# Patient Record
Sex: Male | Born: 1945
Health system: Southern US, Community
[De-identification: ages and names within clinical notes are randomized; demographics above are authoritative.]

## PROBLEM LIST (undated history)

## (undated) DIAGNOSIS — R002 Palpitations: Secondary | ICD-10-CM

## (undated) DIAGNOSIS — I2510 Atherosclerotic heart disease of native coronary artery without angina pectoris: Secondary | ICD-10-CM

## (undated) DIAGNOSIS — E785 Hyperlipidemia, unspecified: Secondary | ICD-10-CM

## (undated) DIAGNOSIS — E119 Type 2 diabetes mellitus without complications: Secondary | ICD-10-CM

## (undated) DIAGNOSIS — K219 Gastro-esophageal reflux disease without esophagitis: Secondary | ICD-10-CM

## (undated) DIAGNOSIS — I214 Non-ST elevation (NSTEMI) myocardial infarction: Secondary | ICD-10-CM

## (undated) DIAGNOSIS — J189 Pneumonia, unspecified organism: Secondary | ICD-10-CM

## (undated) DIAGNOSIS — I10 Essential (primary) hypertension: Secondary | ICD-10-CM

## (undated) DIAGNOSIS — Z9989 Dependence on other enabling machines and devices: Secondary | ICD-10-CM

## (undated) DIAGNOSIS — G4733 Obstructive sleep apnea (adult) (pediatric): Secondary | ICD-10-CM

## (undated) HISTORY — DX: Palpitations: R00.2

## (undated) HISTORY — DX: Essential (primary) hypertension: I10

## (undated) HISTORY — PX: TONSILLECTOMY AND ADENOIDECTOMY: SUR1326

## (undated) HISTORY — PX: WISDOM TOOTH EXTRACTION: SHX21

## (undated) HISTORY — PX: APPENDECTOMY: SHX54

---

## 2001-03-26 HISTORY — PX: LIGAMENT REPAIR: SHX5444

## 2003-01-26 ENCOUNTER — Encounter (INDEPENDENT_AMBULATORY_CARE_PROVIDER_SITE_OTHER): Payer: Self-pay

## 2003-01-26 ENCOUNTER — Ambulatory Visit (HOSPITAL_COMMUNITY): Admission: RE | Admit: 2003-01-26 | Discharge: 2003-01-26 | Payer: Self-pay | Admitting: Gastroenterology

## 2004-09-06 ENCOUNTER — Ambulatory Visit (HOSPITAL_BASED_OUTPATIENT_CLINIC_OR_DEPARTMENT_OTHER): Admission: RE | Admit: 2004-09-06 | Discharge: 2004-09-06 | Payer: Self-pay | Admitting: Family Medicine

## 2004-09-10 ENCOUNTER — Ambulatory Visit: Payer: Self-pay | Admitting: Internal Medicine

## 2007-06-25 DIAGNOSIS — I214 Non-ST elevation (NSTEMI) myocardial infarction: Secondary | ICD-10-CM

## 2007-06-25 HISTORY — DX: Non-ST elevation (NSTEMI) myocardial infarction: I21.4

## 2007-07-02 ENCOUNTER — Inpatient Hospital Stay (HOSPITAL_COMMUNITY): Admission: EM | Admit: 2007-07-02 | Discharge: 2007-07-06 | Payer: Self-pay | Admitting: Emergency Medicine

## 2007-07-03 HISTORY — PX: CORONARY ANGIOPLASTY WITH STENT PLACEMENT: SHX49

## 2007-07-17 ENCOUNTER — Encounter (HOSPITAL_COMMUNITY): Admission: RE | Admit: 2007-07-17 | Discharge: 2007-10-15 | Payer: Self-pay | Admitting: Cardiovascular Disease

## 2007-10-06 HISTORY — PX: TRANSTHORACIC ECHOCARDIOGRAM: SHX275

## 2007-10-06 HISTORY — PX: CARDIOVASCULAR STRESS TEST: SHX262

## 2010-08-08 NOTE — Cardiovascular Report (Signed)
NAMEJAIMESON, GOPAL NO.:  [id number]   MEDICAL RECORD NO.:  [id number]          PATIENT TYPE:  INP   LOCATION:  2916                         FACILITY:  [facility]   PHYSICIAN:  Nanetta Batty, M.D.   DATE OF BIRTH:  Aug 20, 1945   DATE OF PROCEDURE:  DATE OF DISCHARGE:                            CARDIAC CATHETERIZATION   HISTORY OF PRESENT ILLNESS:  Mr. Justin Carr is a 65 year old married white  male, father of 1 living child with a history of tobacco abuse.  He was  admitted on July 02, 2007, with unstable angina.  He had nonspecific ST-  T wave changes and a mild increase in troponin.  He was pain-free on IV  heparin and nitro.  His troponins ended up increasing and his anterior T-  waves inverted, suggesting a proximal LAD lesion.  He presents now for  diagnostic coronary angiography to define his anatomy to rule out  ischemic etiology.   DESCRIPTION OF PROCEDURE:  The patient was brought to the second floor  of [facility] cardiac cath lab in the postabsorptive state.  He was  premedicated with p.o. Valium, IV fentanyl, and Versed.  His right groin  was prepped and shaved in the usual sterile fashion.  Xylocaine 1% was  used for local anesthesia.  A 6-French sheath was inserted into the  right femoral artery using standard Seldinger technique.  A 6-French  right and left Judkins diagnostic catheter as well as a 6-French pigtail  catheter were used for selective coronary angiography, left  ventriculography, subselective left internal mammary artery angiography,  and distal abdominal aortography.  Visipaque dye was used through the  entirety of the case.  Thoracic aorta, left ventricular, and pullback  heart pressures were recorded.   HEMODYNAMIC RESULTS:  1. Aortic systolic pressure 112 and diastolic pressure 59.  2. Left ventricular systolic pressure 114 and diastolic pressure 13.   SELECTIVE CORONARY ANGIOGRAPHY:  1. Left main normal.  2. LAD; the LAD had a 90%  ulcerated plaque in the proximal third      between the first and second diagonal branches.  3. Left circumflex; nondominant and free of significant disease.  4. Ramus intermedius branch; moderate in size and free of significant      disease.  5. Right coronary artery; dominant and free of significant disease.  6. Left internal mammary artery; this vessel is subselectively      visualized and is widely patent.  It is suitable for use during      coronary artery bypass grafting.  7. Distal abdominal aortography; distal abdominal aortogram was      performed using 20 mL of Visipaque dye at 20 mL per second.  There      was approximately 50% proximal right renal artery stenosis.  The      infrarenal abdominal aorta and iliac bifurcation were free of      significant atherosclerotic changes.   IMPRESSION:  Mr. Mannan has high-grade ulcerated proximal left anterior  descending artery disease with a non-ST-elevation myocardial infarction  and anterior T-wave inversion.  We will proceed  with percutaneous  coronary intervention and stenting using drug-eluting stent and  Angiomax.   The 6-French sheath in the right femoral artery was exchanged over the  wire for a 7-French sheath.  Using a 7-French Cordis JL 3.5 guide  catheter along with an Owen 4  190 Asahi soft wire and 2.5 x  predilatation was performed at nominal pressures.  The patient did  receive an Angiomax bolus with an ACT of 404.  He had already received  aspirin prior to coming to the lab.  He received 600 mg of p.o. Plavix  as well as 20 mg of Pepcid IV.  The wire easily crossed the lesion, and  predilatation was performed.  A Promus stent would not cross the lesion,  and after redilatation, a 2.75 x 20 Taxus Liberte stent was then  deployed across the first and second diagonal branches spanning the  lesion at 16 atmospheres (3 mm).  This was then postdilated with a 3.0 x  15 Quantum Maverick at 16 atmospheres (3.03 mm),  resulting in reduction  and 90% lesion to 0% residual with TIMI 3 flow and without dissection.  There was impingement on the ostium of the second diagonal branch.  The  Asahi wire was then pulled back within the stented segment, and the  ostium of the diagonal branch was wired.  This was then dilated with a  1.5 x 12 Voyager at 6 atmospheres, resulting in reduction of 90% ostial  D2 lesion to less than 50%.  This was a 1.5 x 1.75-mm millimeter vessel.  The patient tolerated procedure well.  There were no hemodynamic or  electrocardiographic sequelae.  The guidewire and catheter were removed.  The sheath was then secured in place.  The patient left the lab in  stable condition.  Sheaths will be removed in 2 hours.  The patient will  be treated with aspirin, Plavix, beta-blocker, statin, and ACE  inhibitor.  He will remain in the hospital for the next 24-48 hours  prior to discharge.  Cardiac risk factor modification will be stressed.      Nanetta Batty, M.D.  Electronically Signed     JB/MEDQ  D:  07/03/2007  T:  07/04/2007  Job:  [id number]   cc:   [facility]  [facility]  [place] [facility]

## 2010-08-08 NOTE — Discharge Summary (Signed)
Justin Carr, KIENER NO.:  [id number]   MEDICAL RECORD NO.:  [id number]          PATIENT TYPE:  INP   LOCATION:  3742                         FACILITY:  [facility]   PHYSICIAN:  Darcella Gasman. [place], N.P.  DATE OF BIRTH:  03-17-1946   DATE OF ADMISSION:  07/02/2007  DATE OF DISCHARGE:  07/06/2007                               DISCHARGE SUMMARY   DISCHARGE DIAGNOSES:  1. Non-ST elevation myocardial infarction.  2. Coronary artery disease undergoing percutaneous transluminal      coronary angioplasty and stent deployment with a TAXUS drug-eluting      stent into the mid left anterior descending by Dr. Nanetta Batty.      Normal ejection fraction 55%-60%.  3. Right renal artery stenosis of 60%.  4. Residual coronary disease, 30% left main disease.  5. Obstructive sleep apnea with CPAP.  6. Dyslipidemia.  7. Metabolic syndrome.  8. Tobacco abuse.   DISCHARGE CONDITION:  Improved.   PROCEDURES:  1. Combined left heart cath, July 03, 2007, by Dr. Nanetta Batty.  2. On July 03, 2007, percutaneous transluminal coronary angioplasty      and drug-eluting TAXUS stent to the mid left anterior descending by      Dr. Nanetta Batty.  3. Bradycardia with beta blocker, unable to discharge on beta blocker      secondary to heart rate.  4. Transient blurred vision post procedure with negative CT scan of      the head.   DISCHARGE MEDICATIONS:  1. Plavix 75 mg one daily, do not stop it, could cause a heart attack.  2. Aspirin 325 mg daily.  3. Altace 2.5 mg daily.  4. Lipitor 80 mg daily.  5. Zantac 150 mg twice a day.  6. Nitroglycerin sublingual under your tongue for chest pain one every      5 minutes while sitting and up to 3 tablets over 15 minutes, if      continued pain call 911.  7. Continue CPAP.  8. Follow up with Dr. Allyson Sabal at [facility] in 1-2      weeks, the office will call with date and time.   DISCHARGE INSTRUCTIONS:  1. No work until  after you see Dr. Allyson Sabal.  2. Increase activity slowly, no lifting for 1 week, no driving for 1      week.  3. Low-sodium, heart-healthy moderate carb diet.  Wash cath site with      soap and water.  Call us if any bleeding, swelling, or drainage.  4. Stop smoking.  5. Diet should be low salt, no concentrated sweets, cake, candy, no      white bread, white potatoes, and low-fat diet.   HISTORY OF PRESENT ILLNESS:  A 65 year old white male without previous  medical history presented to [facility] Emergency Room on July 02, 2007, with  complaints of left arm pain and chest pain.  His symptoms had started  the day prior to admission while at work, reports left arm pain with  associated substernal chest discomfort.  The discomfort lasted 5  minutes,  but he felt very poorly after the episode and that feeling  persisted all day, and on April 8, it continued.  He took his dog for a  walk on the evening of July 02, 2007, developed more chest discomfort  with heaviness up into his throat.  He took 2 aspirin and called a  friend to him bring to the emergency room.  At present, the patient was  pain free.  EMS actually brought him to the emergency room.   On admission, he was pain-free.  He had been under a lot of emotional  stress as his 44 year old son died last year at this time.   PAST MEDICAL HISTORY:  Negative.   FAMILY HISTORY:  Positive for coronary disease, father had bypass in his  106s.   SOCIAL HISTORY:  Married 2 sons, though 1 died of CO2 poisoning.   ALLERGIES:  PEANUTS.   OUTPATIENT MEDS:  None.   REVIEW OF SYSTEMS:  See H&P.   PHYSICAL EXAMINATION AT DISCHARGE:  VITAL SIGNS:  Blood pressure 117/63,  pulse 47, respiratory rate is 18, temp 97.5, and oxygen saturation 98%  with a CPAP.  HEART:  Regular rate and rhythm.  LUNGS:  Clear.  ABDOMEN:  Positive bowel sounds.  EXTREMITIES: No edema and the right groin cath site had been stable.   LABORATORY DATA:  Admission CBC,  hemoglobin 16.5, hematocrit 48.6, WBC  9, platelets 200,000, MCV 96.6, and neutrophils 59.  At discharge,  hemoglobin 14.5, hematocrit 41.8, WBC 8.8, and platelets 177,000.  On  chemistry, sodium 142, potassium 4.2, chloride 107, CO2 29, BUN 8,  creatinine 0.89, and glucose 132.  On discharge, essentially the same  but glucose was 98.  Coags on admission, pro-time 13.5, INR of 1, and  PTT 141 on heparin and it was therapeutic.  LFTs were all normal.  AST  24, ALT 28, and alkaline phos 66.  Cardiac enzymes, initially CK was 113  and MB was 6.7, the peak CK was 133 with an MB of 18.3 and prior to  discharge, CK was 42 and MB 1.3.  Troponin I peak was 1.52.   LDL was 139, cholesterol 199, HDL 30, and triglycerides 841.   Magnesium was 2.3.  Calcium 8.7.  BNP was 85.  TSH 2.246.   Glycohemoglobin was 6.2.   CHEST X-RAY:  No acute cardiopulmonary abnormality.  He also received a  CT of his head on July 04, 2007.  He had blurred vision lasting 15  minutes.  Subcentimeter hypodensity at the left pontomedullary junction  could be a dilated perivascular space, chronic lacunar infarct or  artifact, otherwise normal noncontrast appearance of the brain, no acute  findings.   EKG:  On admission, sinus rhythm rate at 63, nonspecific T-wave  abnormalities.  On followup, July 03, 2007, at 7:00 a.m., he had T-wave  inversions in V3 through V6 and in aVL.   Continued deep T-wave inversions have been maintained since that time,  but the patient has been asymptomatic.   HOSPITAL COURSE:  Mr. Justin Carr was admitted on July 02, 2007, with unstable  anginas.  The enzymes came back positive for non-ST elevation MI.  Initial EKG was without acute changes, just nonspecific ST changes.  Later in the morning, he had deep T-wave inversions in his anterolateral  leads, and he had positive cardiac enzymes.  He underwent cardiac  catheterization and stent deployment was done as previously stated.   The patient did  well postprocedure.  Right  groin was stable.  Cardiac  rehab was started.  Medications were adjusted.  He was ambulating  without problems.  By July 06, 2007, he was stable and ready for  discharge home.  We had attempted Lopressor 12.5 mg twice a day, but he  did not tolerate it due to significant bradycardia.  Therefore, he is  not going home on a beta blocker at this time.  He does have obstructive  sleep apnea and is on CPAP at home, and we have recommended him for  phase II cardiac rehab.  Please note, glycohemoglobin was very slightly  elevated.  The patient has metabolic syndrome and was instructed on diet  changes and would need to follow up with primary care for further  management of his metabolic syndrome.  He will follow up with Dr. Allyson Sabal  for instructions on the return to work.  He was seen and discharged by  Dr. Domingo Sep on July 06, 2007.      Darcella Gasman. Annie Paras, N.P.     LRI/MEDQ  D:  07/06/2007  T:  07/07/2007  Job:  [id number]   cc:   Nanetta Batty, M.D.  [facility]

## 2010-08-11 NOTE — Procedures (Signed)
NAMESTEN, Justin Carr NO.:  [id number]   MEDICAL RECORD NO.:  [id number]          PATIENT TYPE:  OUT   LOCATION:  SLEEP CENTER                 FACILITY:  [facility]   PHYSICIAN:  Clinton D. Maple Hudson, M.D. DATE OF BIRTH:  [birth year]   DATE OF STUDY:  09/06/2004                              NOCTURNAL POLYSOMNOGRAM   REFERRING PHYSICIAN:  Deatra James, MD   INDICATION FOR STUDY:  Hypersomnia with sleep apnea.  Epworth sleepiness  score 11/24, BMI 27, weight 195 pounds.   SLEEP ARCHITECTURE:  Total sleep time 337 minutes with sleep efficiency 75%.  Stage I 11%, stage II 74%, stages III and IV absent, REM 15% of total sleep  time.  Sleep latency 62 minutes, REM latency 237 minutes, awake after sleep  onset 50 minutes, arousal index 26.  No bedtime medications taken.   RESPIRATORY DATA:  Split study protocol.  Respiratory disturbance index  (RDI, AHI) 41.1 obstructive events per hour, indicating moderately severe  obstructive sleep apnea/hypopnea syndrome before CPAP.  There were 43  obstructive apneas, 13 central apneas and 25 fixed apneas with 10 hypopneas  before CPAP control.  Most events and most sleep were while supine.  REM RDI  8.6.  CPAP was titrated to 10 cwp, RDI 4.6 per hour, using a Respironics  Comfort Light tube with small nasal pillows and heated humidifier.   OXYGEN DATA:  Moderate snoring with oxygen desaturation to a nadir of 88%  before CPAP.  After CPAP control, saturation held 96-98% on room air.   CARDIAC DATA:  Sinus rhythm with occasional PAC and PVC.   MOVEMENT PARASOMNIA:  Occasional leg jerks with little effect on sleep.   IMPRESSION RECOMMENDATION:  1.  Moderately severe obstructive sleep apnea/hypopnea syndrome, RDI 41.1      per hour with moderate snoring and oxygen desaturation to 88%.  2.  Successful CPAP titration to 10 cwp, RDI 4.6 per hour using a      Respironics Comfort Lite 2 with small nasal pillows and heated       humidifier.      Clinton D. Maple Hudson, M.D.  Diplomat   CDY/MEDQ  D:  09/10/2004 11:23:44  T:  09/11/2004 16:08:07  Job:  [id number]

## 2010-08-11 NOTE — Op Note (Signed)
   [name], Justin Carr                              ACCOUNT NO.:  [id number]   MEDICAL RECORD NO.:  [id number]                   PATIENT TYPE:  AMB   LOCATION:  ENDO                                 [facility]ACILITY[facility]   PHYSICIAN:  Bernette Redbird, M.D.                DATE OF BIRTH:  [birth year]   DATE OF PROCEDURE:  DATE OF DISCHARGE:                                 OPERATIVE REPORT   No dictation for this job.                                               Bernette Redbird, M.D.    RB/MEDQ  D:  01/26/2003  T:  01/26/2003  Job:  [id number]

## 2010-12-19 LAB — BASIC METABOLIC PANEL
BUN: 10
CO2: 28
Chloride: 104
Chloride: 105
Chloride: 106
Creatinine, Ser: 0.93
Creatinine, Ser: 1.1
GFR calc Af Amer: >60
GFR calc Af Amer: >60
GFR calc non Af Amer: >60
Potassium: 3.9
Potassium: 4
Sodium: 139

## 2010-12-19 LAB — POCT I-STAT, CHEM 8
Calcium, Ion: 1.18
Chloride: 106
Creatinine, Ser: 1.3
Glucose, Bld: 102 — ABNORMAL HIGH
HCT: 49

## 2010-12-19 LAB — CBC
HCT: 41.9
HCT: 42.3
HCT: 48.6
Hemoglobin: 16.5
MCHC: 34
MCV: 95.8
MCV: 96.6
MCV: 97
Platelets: 179
RBC: 4.31
RBC: 4.37
RBC: 4.38
RDW: 13.5
WBC: 8.7
WBC: 8.8
WBC: 9.9

## 2010-12-19 LAB — COMPREHENSIVE METABOLIC PANEL
ALT: 28
Alkaline Phosphatase: 66
BUN: 8
CO2: 29
Chloride: 107
GFR calc non Af Amer: >60
Glucose, Bld: 132 — ABNORMAL HIGH
Potassium: 4.2
Sodium: 142
Total Bilirubin: 0.9

## 2010-12-19 LAB — HEMOGLOBIN A1C
Hgb A1c MFr Bld: 6.2 — ABNORMAL HIGH
Mean Plasma Glucose: 143

## 2010-12-19 LAB — CARDIAC PANEL(CRET KIN+CKTOT+MB+TROPI)
CK, MB: 1.3
CK, MB: 14.3 — ABNORMAL HIGH
Relative Index: 12.5 — ABNORMAL HIGH
Relative Index: 13.8 — ABNORMAL HIGH
Relative Index: INVALID
Total CK: 42
Total CK: 97
Troponin I: 1.46
Troponin I: 1.52

## 2010-12-19 LAB — DIFFERENTIAL
Basophils Absolute: 0
Eosinophils Relative: 2
Lymphocytes Relative: 30
Monocytes Absolute: 0.8
Monocytes Relative: 9

## 2010-12-19 LAB — LIPID PANEL
Cholesterol: 199
HDL: 30 — ABNORMAL LOW
LDL Cholesterol: 139 — ABNORMAL HIGH
Total CHOL/HDL Ratio: 6.6

## 2010-12-19 LAB — POCT CARDIAC MARKERS: Troponin i, poc: 0.19 — ABNORMAL HIGH

## 2010-12-19 LAB — TROPONIN I
Troponin I: 0.51
Troponin I: 1.03

## 2010-12-19 LAB — B-NATRIURETIC PEPTIDE (CONVERTED LAB): Pro B Natriuretic peptide (BNP): 85

## 2010-12-19 LAB — CK TOTAL AND CKMB (NOT AT ARMC)
CK, MB: 6.7 — ABNORMAL HIGH
Relative Index: 5.9 — ABNORMAL HIGH
Relative Index: INVALID
Total CK: 113

## 2010-12-19 LAB — HEPARIN LEVEL (UNFRACTIONATED): Heparin Unfractionated: <0.1 — ABNORMAL LOW

## 2011-04-09 DIAGNOSIS — Z79899 Other long term (current) drug therapy: Secondary | ICD-10-CM | POA: Diagnosis not present

## 2011-04-09 DIAGNOSIS — E782 Mixed hyperlipidemia: Secondary | ICD-10-CM | POA: Diagnosis not present

## 2011-04-09 DIAGNOSIS — I701 Atherosclerosis of renal artery: Secondary | ICD-10-CM | POA: Diagnosis not present

## 2011-04-09 DIAGNOSIS — I10 Essential (primary) hypertension: Secondary | ICD-10-CM | POA: Diagnosis not present

## 2011-04-20 DIAGNOSIS — E119 Type 2 diabetes mellitus without complications: Secondary | ICD-10-CM | POA: Diagnosis not present

## 2011-04-20 DIAGNOSIS — I2510 Atherosclerotic heart disease of native coronary artery without angina pectoris: Secondary | ICD-10-CM | POA: Diagnosis not present

## 2011-04-20 DIAGNOSIS — I701 Atherosclerosis of renal artery: Secondary | ICD-10-CM | POA: Diagnosis not present

## 2011-05-04 DIAGNOSIS — K5289 Other specified noninfective gastroenteritis and colitis: Secondary | ICD-10-CM | POA: Diagnosis not present

## 2011-05-21 DIAGNOSIS — J40 Bronchitis, not specified as acute or chronic: Secondary | ICD-10-CM | POA: Diagnosis not present

## 2011-05-22 DIAGNOSIS — G4733 Obstructive sleep apnea (adult) (pediatric): Secondary | ICD-10-CM | POA: Diagnosis not present

## 2011-05-22 DIAGNOSIS — E782 Mixed hyperlipidemia: Secondary | ICD-10-CM | POA: Diagnosis not present

## 2011-05-22 DIAGNOSIS — I10 Essential (primary) hypertension: Secondary | ICD-10-CM | POA: Diagnosis not present

## 2011-07-11 DIAGNOSIS — G4733 Obstructive sleep apnea (adult) (pediatric): Secondary | ICD-10-CM | POA: Diagnosis not present

## 2011-07-11 DIAGNOSIS — G4761 Periodic limb movement disorder: Secondary | ICD-10-CM | POA: Diagnosis not present

## 2011-07-24 ENCOUNTER — Other Ambulatory Visit: Payer: Self-pay | Admitting: Dermatology

## 2011-07-24 DIAGNOSIS — D239 Other benign neoplasm of skin, unspecified: Secondary | ICD-10-CM | POA: Diagnosis not present

## 2011-07-24 DIAGNOSIS — L821 Other seborrheic keratosis: Secondary | ICD-10-CM | POA: Diagnosis not present

## 2011-07-24 DIAGNOSIS — L820 Inflamed seborrheic keratosis: Secondary | ICD-10-CM | POA: Diagnosis not present

## 2011-07-24 DIAGNOSIS — L578 Other skin changes due to chronic exposure to nonionizing radiation: Secondary | ICD-10-CM | POA: Diagnosis not present

## 2011-08-14 ENCOUNTER — Ambulatory Visit (HOSPITAL_BASED_OUTPATIENT_CLINIC_OR_DEPARTMENT_OTHER): Payer: Medicare Other | Attending: Cardiovascular Disease | Admitting: General Practice

## 2011-08-14 VITALS — Ht 71.0 in | Wt 205.0 lb

## 2011-08-14 DIAGNOSIS — G4710 Hypersomnia, unspecified: Secondary | ICD-10-CM | POA: Insufficient documentation

## 2011-08-14 DIAGNOSIS — G4733 Obstructive sleep apnea (adult) (pediatric): Secondary | ICD-10-CM

## 2011-08-14 DIAGNOSIS — R259 Unspecified abnormal involuntary movements: Secondary | ICD-10-CM | POA: Diagnosis not present

## 2011-08-25 DIAGNOSIS — G4730 Sleep apnea, unspecified: Secondary | ICD-10-CM

## 2011-08-25 DIAGNOSIS — G4710 Hypersomnia, unspecified: Secondary | ICD-10-CM

## 2011-08-25 DIAGNOSIS — R259 Unspecified abnormal involuntary movements: Secondary | ICD-10-CM | POA: Diagnosis not present

## 2011-08-25 NOTE — Procedures (Signed)
Justin Carr, Justin Carr NO.:  [id number]  MEDICAL RECORD NO.:  [id number]          PATIENT TYPE:  OUT  LOCATION:  SLEEP CENTER                 [facility]ACILITY:  [facility]  PHYSICIAN:  Hermann Dottavio D. Maple Hudson, MD, FCCP, FACPDATE OF BIRTH:  [birth year]1945  DATE OF STUDY:  08/14/2011                           NOCTURNAL POLYSOMNOGRAM  REFERRING PHYSICIAN:  Nicki Guadalajara, M.D.  INDICATION FOR STUDY:  Hypersomnia with sleep apnea.  EPWORTH SLEEPINESS SCORE:  7/24.  BMI 29, weight 205 pounds.  Height 71 inches.  Neck 16 inches.  MEDICATIONS:  Home medications are charted and reviewed.  The baseline diagnostic NPSG report is available from [facility], dated July 11, 2011 which recorded an AHI of 10.7 per hour.  CPAP titration is requested.  SLEEP ARCHITECTURE:  Total sleep time 280.5 minutes with sleep efficiency 75.5%.  Stage I was 7.5%.  Stage II 72%.  Stage III absent. REM 20.5% of total sleep time.  Sleep latency 36 minutes.  REM latency 209.5 minutes.  Awake after sleep onset 56.5 minutes.  Arousal index 6.8.  Bedtime medication:  Aspirin.  RESPIRATORY DATA:  CPAP titration protocol.  CPAP was titrated to 8 CWP, AHI 2.5 per hour.  He wore a standard Fisher and Paykel Pilariro nasal pillow mask with heated humidifier and C flex setting of 3.  OXYGEN DATA:  Snoring was prevented at final CPAP and mean oxygen saturation held 95.9% on room air.  CARDIAC DATA:  Normal sinus rhythm.  MOVEMENT-PARASOMNIA:  A total of 81 limb jerks were counted, of which 2 were associated with arousals or awakenings for periodic limb movement with arousal index of 0.4 per hour.  No bathroom trips periods.  IMPRESSION-RECOMMENDATION: 1. Successful CPAP titration to 8 CWP, apnea-hypopnea index 2.5 per     hour.  He wore a standard Fisher and Paykel Pilariro nasal pillow     mask with heated humidifier and C flex setting of 3.  CPAP was well     tolerated.  Snoring was prevented and  mean oxygen saturation held     95.9% on room air. 2. Baseline diagnostic NPSG on July 11, 2011, at [facility] recorded an apnea-hypopnea index of 10.7 per hour. 3. Limb jerks were noted during titration.  A total of 81 limb jerks     were counted, of which 2 were associated with arousals or     awakenings for a periodic limb movement arousal index of 0.4 per     hour.  This is of doubtful clinical     significance.  Limb jerks are common during CPAP titration and     usually less significant after adjustment to CPAP.     Jordynne Mccown D. Maple Hudson, MD, [facility], FACP Diplomate, American Board of Sleep Medicine    CDY/MEDQ  D:  08/25/2011 08:31:59  T:  08/25/2011 09:05:26  Job:  [id number]

## 2011-10-25 DIAGNOSIS — G4733 Obstructive sleep apnea (adult) (pediatric): Secondary | ICD-10-CM | POA: Diagnosis not present

## 2011-10-25 DIAGNOSIS — G4737 Central sleep apnea in conditions classified elsewhere: Secondary | ICD-10-CM | POA: Diagnosis not present

## 2011-10-25 DIAGNOSIS — H3530 Unspecified macular degeneration: Secondary | ICD-10-CM | POA: Diagnosis not present

## 2011-10-25 DIAGNOSIS — H524 Presbyopia: Secondary | ICD-10-CM | POA: Diagnosis not present

## 2011-10-25 DIAGNOSIS — E119 Type 2 diabetes mellitus without complications: Secondary | ICD-10-CM | POA: Diagnosis not present

## 2011-10-25 DIAGNOSIS — H5200 Hypermetropia, unspecified eye: Secondary | ICD-10-CM | POA: Diagnosis not present

## 2011-12-19 DIAGNOSIS — Z1211 Encounter for screening for malignant neoplasm of colon: Secondary | ICD-10-CM | POA: Diagnosis not present

## 2011-12-19 DIAGNOSIS — E785 Hyperlipidemia, unspecified: Secondary | ICD-10-CM | POA: Diagnosis not present

## 2011-12-19 DIAGNOSIS — Z125 Encounter for screening for malignant neoplasm of prostate: Secondary | ICD-10-CM | POA: Diagnosis not present

## 2011-12-19 DIAGNOSIS — N529 Male erectile dysfunction, unspecified: Secondary | ICD-10-CM | POA: Diagnosis not present

## 2011-12-19 DIAGNOSIS — Z0000 Encounter for general adult medical examination without abnormal findings: Secondary | ICD-10-CM | POA: Diagnosis not present

## 2011-12-19 DIAGNOSIS — Z23 Encounter for immunization: Secondary | ICD-10-CM | POA: Diagnosis not present

## 2011-12-19 DIAGNOSIS — I2510 Atherosclerotic heart disease of native coronary artery without angina pectoris: Secondary | ICD-10-CM | POA: Diagnosis not present

## 2011-12-19 DIAGNOSIS — E119 Type 2 diabetes mellitus without complications: Secondary | ICD-10-CM | POA: Diagnosis not present

## 2012-01-04 DIAGNOSIS — Z87891 Personal history of nicotine dependence: Secondary | ICD-10-CM | POA: Diagnosis not present

## 2012-01-11 DIAGNOSIS — G4737 Central sleep apnea in conditions classified elsewhere: Secondary | ICD-10-CM | POA: Diagnosis not present

## 2012-01-11 DIAGNOSIS — G4733 Obstructive sleep apnea (adult) (pediatric): Secondary | ICD-10-CM | POA: Diagnosis not present

## 2012-01-16 DIAGNOSIS — Z23 Encounter for immunization: Secondary | ICD-10-CM | POA: Diagnosis not present

## 2012-04-11 DIAGNOSIS — Z79899 Other long term (current) drug therapy: Secondary | ICD-10-CM | POA: Diagnosis not present

## 2012-04-11 DIAGNOSIS — E782 Mixed hyperlipidemia: Secondary | ICD-10-CM | POA: Diagnosis not present

## 2012-04-17 DIAGNOSIS — I2510 Atherosclerotic heart disease of native coronary artery without angina pectoris: Secondary | ICD-10-CM | POA: Diagnosis not present

## 2012-04-17 DIAGNOSIS — I10 Essential (primary) hypertension: Secondary | ICD-10-CM | POA: Diagnosis not present

## 2012-04-17 DIAGNOSIS — E782 Mixed hyperlipidemia: Secondary | ICD-10-CM | POA: Diagnosis not present

## 2012-04-21 ENCOUNTER — Other Ambulatory Visit (HOSPITAL_COMMUNITY): Payer: Self-pay | Admitting: Cardiovascular Disease

## 2012-04-21 DIAGNOSIS — I701 Atherosclerosis of renal artery: Secondary | ICD-10-CM

## 2012-05-20 ENCOUNTER — Ambulatory Visit (HOSPITAL_COMMUNITY)
Admission: RE | Admit: 2012-05-20 | Discharge: 2012-05-20 | Disposition: A | Discharge status: Home / Self Care | Payer: Medicare Other | Source: Ambulatory Visit | Attending: Cardiovascular Disease | Admitting: Cardiovascular Disease

## 2012-05-20 DIAGNOSIS — I739 Peripheral vascular disease, unspecified: Secondary | ICD-10-CM | POA: Diagnosis not present

## 2012-05-20 DIAGNOSIS — I10 Essential (primary) hypertension: Secondary | ICD-10-CM | POA: Diagnosis not present

## 2012-05-20 DIAGNOSIS — I701 Atherosclerosis of renal artery: Secondary | ICD-10-CM | POA: Diagnosis not present

## 2012-05-20 HISTORY — PX: OTHER SURGICAL HISTORY: SHX169

## 2012-05-20 NOTE — Progress Notes (Signed)
Renal Duplex Completed. Hung Rhinesmith D  

## 2012-06-05 DIAGNOSIS — J069 Acute upper respiratory infection, unspecified: Secondary | ICD-10-CM | POA: Diagnosis not present

## 2012-06-27 DIAGNOSIS — M25519 Pain in unspecified shoulder: Secondary | ICD-10-CM | POA: Diagnosis not present

## 2012-07-07 DIAGNOSIS — M5412 Radiculopathy, cervical region: Secondary | ICD-10-CM | POA: Diagnosis not present

## 2012-07-10 DIAGNOSIS — G4733 Obstructive sleep apnea (adult) (pediatric): Secondary | ICD-10-CM | POA: Diagnosis not present

## 2012-07-10 DIAGNOSIS — G4737 Central sleep apnea in conditions classified elsewhere: Secondary | ICD-10-CM | POA: Diagnosis not present

## 2012-07-15 ENCOUNTER — Other Ambulatory Visit: Payer: Self-pay | Admitting: Family Medicine

## 2012-07-15 DIAGNOSIS — M25512 Pain in left shoulder: Secondary | ICD-10-CM

## 2012-07-15 DIAGNOSIS — M542 Cervicalgia: Secondary | ICD-10-CM

## 2012-07-19 ENCOUNTER — Ambulatory Visit
Admission: RE | Admit: 2012-07-19 | Discharge: 2012-07-19 | Disposition: A | Discharge status: Home / Self Care | Payer: Medicare Other | Source: Ambulatory Visit | Attending: Family Medicine | Admitting: Family Medicine

## 2012-07-19 DIAGNOSIS — M25512 Pain in left shoulder: Secondary | ICD-10-CM

## 2012-07-19 DIAGNOSIS — M542 Cervicalgia: Secondary | ICD-10-CM

## 2012-07-19 DIAGNOSIS — M5030 Other cervical disc degeneration, unspecified cervical region: Secondary | ICD-10-CM | POA: Diagnosis not present

## 2012-07-19 DIAGNOSIS — M47812 Spondylosis without myelopathy or radiculopathy, cervical region: Secondary | ICD-10-CM | POA: Diagnosis not present

## 2012-07-22 ENCOUNTER — Other Ambulatory Visit: Payer: Self-pay | Admitting: Dermatology

## 2012-07-22 DIAGNOSIS — D239 Other benign neoplasm of skin, unspecified: Secondary | ICD-10-CM | POA: Diagnosis not present

## 2012-07-22 DIAGNOSIS — L819 Disorder of pigmentation, unspecified: Secondary | ICD-10-CM | POA: Diagnosis not present

## 2012-07-22 DIAGNOSIS — L821 Other seborrheic keratosis: Secondary | ICD-10-CM | POA: Diagnosis not present

## 2012-07-22 DIAGNOSIS — D1801 Hemangioma of skin and subcutaneous tissue: Secondary | ICD-10-CM | POA: Diagnosis not present

## 2012-07-22 DIAGNOSIS — L578 Other skin changes due to chronic exposure to nonionizing radiation: Secondary | ICD-10-CM | POA: Diagnosis not present

## 2012-07-22 DIAGNOSIS — D485 Neoplasm of uncertain behavior of skin: Secondary | ICD-10-CM | POA: Diagnosis not present

## 2012-09-18 DIAGNOSIS — M5000 Cervical disc disorder with myelopathy, unspecified cervical region: Secondary | ICD-10-CM | POA: Diagnosis not present

## 2012-10-31 DIAGNOSIS — H3530 Unspecified macular degeneration: Secondary | ICD-10-CM | POA: Diagnosis not present

## 2012-10-31 DIAGNOSIS — H2510 Age-related nuclear cataract, unspecified eye: Secondary | ICD-10-CM | POA: Diagnosis not present

## 2012-12-25 DIAGNOSIS — E785 Hyperlipidemia, unspecified: Secondary | ICD-10-CM | POA: Diagnosis not present

## 2012-12-25 DIAGNOSIS — N529 Male erectile dysfunction, unspecified: Secondary | ICD-10-CM | POA: Diagnosis not present

## 2012-12-25 DIAGNOSIS — I10 Essential (primary) hypertension: Secondary | ICD-10-CM | POA: Diagnosis not present

## 2012-12-25 DIAGNOSIS — Z1159 Encounter for screening for other viral diseases: Secondary | ICD-10-CM | POA: Diagnosis not present

## 2012-12-25 DIAGNOSIS — Z0000 Encounter for general adult medical examination without abnormal findings: Secondary | ICD-10-CM | POA: Diagnosis not present

## 2012-12-25 DIAGNOSIS — E119 Type 2 diabetes mellitus without complications: Secondary | ICD-10-CM | POA: Diagnosis not present

## 2013-01-05 DIAGNOSIS — M25519 Pain in unspecified shoulder: Secondary | ICD-10-CM | POA: Diagnosis not present

## 2013-01-19 DIAGNOSIS — Z23 Encounter for immunization: Secondary | ICD-10-CM | POA: Diagnosis not present

## 2013-03-17 ENCOUNTER — Other Ambulatory Visit: Payer: Self-pay | Admitting: Gastroenterology

## 2013-03-17 DIAGNOSIS — Z1211 Encounter for screening for malignant neoplasm of colon: Secondary | ICD-10-CM | POA: Diagnosis not present

## 2013-03-17 DIAGNOSIS — D126 Benign neoplasm of colon, unspecified: Secondary | ICD-10-CM | POA: Diagnosis not present

## 2013-03-24 ENCOUNTER — Encounter (HOSPITAL_COMMUNITY): Payer: Self-pay | Admitting: Pharmacy Technician

## 2013-03-24 NOTE — Pre-Procedure Instructions (Signed)
Wiliam Cauthorn  03/24/2013   Your procedure is scheduled on: Friday, January 9th.  Report to [facility], Main Entrance / Entrance "A" at 10:50 AM.  Call this number if you have problems the morning of surgery: [phone]   Remember:   Do not eat food or drink liquids after midnight, Thursday, January 8th.   Take these medicines the morning of surgery with A SIP OF WATER: ranitidine (ZANTAC)    Do not wear jewelry.  Do not wear lotions, powders, or colgnes. You may wear deodorant.  Men may shave face and neck only.  Do not bring valuables to the hospital.  [facility] is not responsible for any belongings or valuables.               Contacts, dentures or bridgework may not be worn into surgery.  Leave suitcase in the car. After surgery it may be brought to your room.  For patients admitted to the hospital, discharge time is determined by your                treatment team.               Patients discharged the day of surgery will not be allowed to drive home.  Name and phone number of your driver: -   Special Instructions: Shower using CHG 2 nights before surgery and the night before surgery.  If you shower the day of surgery use CHG.  Use special wash - you have one bottle of CHG for all showers.  You should use approximately 1/3 of the bottle for each shower.   Please read over the following fact sheets that you were given: Pain Booklet, Coughing and Deep Breathing and Surgical Site Infection Prevention

## 2013-03-25 ENCOUNTER — Encounter (HOSPITAL_COMMUNITY): Payer: Self-pay

## 2013-03-25 ENCOUNTER — Encounter (HOSPITAL_COMMUNITY)
Admission: RE | Admit: 2013-03-25 | Discharge: 2013-03-25 | Disposition: A | Discharge status: Home / Self Care | Payer: Medicare Other | Source: Ambulatory Visit | Attending: Anesthesiology | Admitting: Anesthesiology

## 2013-03-25 ENCOUNTER — Telehealth: Payer: Self-pay | Admitting: Cardiovascular Disease

## 2013-03-25 ENCOUNTER — Encounter (HOSPITAL_COMMUNITY)
Admission: RE | Admit: 2013-03-25 | Discharge: 2013-03-25 | Disposition: A | Discharge status: Home / Self Care | Payer: Medicare Other | Source: Ambulatory Visit | Attending: Orthopedic Surgery | Admitting: Orthopedic Surgery

## 2013-03-25 DIAGNOSIS — Z01818 Encounter for other preprocedural examination: Secondary | ICD-10-CM | POA: Diagnosis not present

## 2013-03-25 DIAGNOSIS — Z01810 Encounter for preprocedural cardiovascular examination: Secondary | ICD-10-CM | POA: Insufficient documentation

## 2013-03-25 DIAGNOSIS — Z01812 Encounter for preprocedural laboratory examination: Secondary | ICD-10-CM | POA: Insufficient documentation

## 2013-03-25 DIAGNOSIS — J40 Bronchitis, not specified as acute or chronic: Secondary | ICD-10-CM | POA: Diagnosis not present

## 2013-03-25 HISTORY — DX: Gastro-esophageal reflux disease without esophagitis: K21.9

## 2013-03-25 HISTORY — DX: Hyperlipidemia, unspecified: E78.5

## 2013-03-25 HISTORY — DX: Atherosclerotic heart disease of native coronary artery without angina pectoris: I25.10

## 2013-03-25 LAB — BASIC METABOLIC PANEL
BUN: 17 mg/dL (ref 6–23)
Calcium: 9.3 mg/dL (ref 8.4–10.5)
Chloride: 102 mEq/L (ref 96–112)
GFR calc Af Amer: >90 mL/min (ref >90)
Potassium: 4.5 mEq/L (ref 3.7–5.3)
Sodium: 142 mEq/L (ref 137–147)

## 2013-03-25 LAB — CBC
HCT: 44.9 % (ref 39.0–52.0)
Hemoglobin: 16.2 g/dL (ref 13.0–17.0)
MCHC: 36.1 g/dL — ABNORMAL HIGH (ref 30.0–36.0)
RBC: 4.81 MIL/uL (ref 4.22–5.81)
RDW: 12.9 % (ref 11.5–15.5)
WBC: 7.6 10*3/uL (ref 4.0–10.5)

## 2013-03-25 MED ORDER — CHLORHEXIDINE GLUCONATE 4 % EX LIQD: 60.0000 mL | Freq: Once | CUTANEOUS | Status: DC

## 2013-03-25 NOTE — Progress Notes (Signed)
Per Toniann Fail in office pt to stop aspirin 5 days prior to surgery, waiting for Dr Allyson Sabal to give instructions on when to stop plavix.  Chart to [facility] for review

## 2013-03-25 NOTE — Telephone Encounter (Signed)
Spoke to [place]- with Dr Rennis Chris.  Toniann Fail wanted to know if patient had cardiac clearance from Dr Allyson Sabal.The patient is at anesth.doing pre-op for his surgery 04/03/13.   Spoke to [employer] -She had signed form awaiting to fax-- Notified Wendy Cardiac Clearance was faxed to [phone]

## 2013-03-27 ENCOUNTER — Encounter (HOSPITAL_COMMUNITY): Payer: Self-pay

## 2013-03-27 NOTE — Progress Notes (Signed)
Anesthesia Chart Review:  Patient is a 68 year old male scheduled for right shoulder arthroscopy with subacromial decompression and distal clavicle resection on 04/03/13 by Dr. Onnie Graham.  History includes former smoker, CAD/NSTEMI '09 s/p LAD DES, DM2, HLD, GERD, OSA, right wrist surgery.  CABG is checked on his history, but there is only mention of an LAD stent according to Dr. Kennon Holter notes. PCP is Dr. Ernestine Conrad.  Endocrinologist is Dr. Chalmers Cater.  Cardiologist is Dr. [name].  He cleared patient for this procedure with permission to hold ASA and Plavix 5-7 days preoperatively. Abigail Butts at Dr. Susie Cassette office states patient was told to hold both five days preoperatively.)  EKG on 03/25/13 showed NSR.  Nuclear stress test on 10/06/07 showed normal myocardial perfusion demonstrating attenuation artifact in the inferior region of the myocardium.  No ischemia or infarct/scar seen in the remaining myocardium.  No significant ischemia.  Post stress EF 68%.  No significant wall motion abnormalities.  Low risk scan.  Echo on 10/06/07 showed normal LV size, EF, and wall motion.  Cardiac cath on 07/03/07 showed: 1. Left main normal.  2. LAD; the LAD had a 90% ulcerated plaque in the proximal third between the first and second diagonal branches.  3. Left circumflex; nondominant and free of significant disease.  4. Ramus intermedius branch; moderate in size and free of significant disease.  5. Right coronary artery; dominant and free of significant disease.  6. Left internal mammary artery; this vessel is subselectively visualized and is widely patent. It is suitable for use during coronary artery bypass grafting.  7. Distal abdominal aortography; distal abdominal aortogram was performed using 20 mL of Visipaque dye at 20 mL per second. There was approximately 50% proximal right renal artery stenosis. The infrarenal abdominal aorta and iliac bifurcation were free of significant atherosclerotic changes.   CXR  on 03/25/13 showed: Mild lung hyperexpansion and bronchitic change without acute cardiopulmonary disease.   Preoperative labs noted.  He will get a fasting glucose on arrival.  He has been cleared by cardiology.  If glucose is reasonable and otherwise no acute changes then I would anticipate that he could proceed as planned.  George Hugh [facility] Short Stay Center/Anesthesiology Phone [phone] 03/27/2013 4:43 PM

## 2013-04-02 MED ORDER — CEFAZOLIN SODIUM-DEXTROSE 2-3 GM-% IV SOLR
2.0000 g | INTRAVENOUS | Status: AC
  Administered 2013-04-03: 2 g via INTRAVENOUS
  Filled 2013-04-02: qty 50

## 2013-04-02 NOTE — Progress Notes (Signed)
Pt notified of new arrival time of 10:15-verbalized understanding

## 2013-04-03 ENCOUNTER — Ambulatory Visit (HOSPITAL_COMMUNITY)
Admission: RE | Admit: 2013-04-03 | Discharge: 2013-04-03 | Disposition: A | Discharge status: Home / Self Care | Payer: Medicare Other | Source: Ambulatory Visit | Attending: Orthopedic Surgery | Admitting: Orthopedic Surgery

## 2013-04-03 ENCOUNTER — Encounter (HOSPITAL_COMMUNITY): Payer: Self-pay | Admitting: *Deleted

## 2013-04-03 ENCOUNTER — Ambulatory Visit (HOSPITAL_COMMUNITY): Payer: Medicare Other | Admitting: Anesthesiology

## 2013-04-03 ENCOUNTER — Encounter (HOSPITAL_COMMUNITY): Payer: Medicare Other | Admitting: Vascular Surgery

## 2013-04-03 ENCOUNTER — Encounter (HOSPITAL_COMMUNITY)
Admission: RE | Disposition: A | Discharge status: Home / Self Care | Payer: Self-pay | Source: Ambulatory Visit | Attending: Orthopedic Surgery

## 2013-04-03 DIAGNOSIS — X58XXXA Exposure to other specified factors, initial encounter: Secondary | ICD-10-CM | POA: Insufficient documentation

## 2013-04-03 DIAGNOSIS — M7580 Other shoulder lesions, unspecified shoulder: Principal | ICD-10-CM

## 2013-04-03 DIAGNOSIS — Z87891 Personal history of nicotine dependence: Secondary | ICD-10-CM | POA: Insufficient documentation

## 2013-04-03 DIAGNOSIS — Z91010 Allergy to peanuts: Secondary | ICD-10-CM | POA: Insufficient documentation

## 2013-04-03 DIAGNOSIS — S43439A Superior glenoid labrum lesion of unspecified shoulder, initial encounter: Secondary | ICD-10-CM | POA: Diagnosis not present

## 2013-04-03 DIAGNOSIS — M9420 Chondromalacia, unspecified site: Secondary | ICD-10-CM | POA: Diagnosis not present

## 2013-04-03 DIAGNOSIS — I2510 Atherosclerotic heart disease of native coronary artery without angina pectoris: Secondary | ICD-10-CM | POA: Diagnosis not present

## 2013-04-03 DIAGNOSIS — E119 Type 2 diabetes mellitus without complications: Secondary | ICD-10-CM | POA: Insufficient documentation

## 2013-04-03 DIAGNOSIS — K219 Gastro-esophageal reflux disease without esophagitis: Secondary | ICD-10-CM | POA: Diagnosis not present

## 2013-04-03 DIAGNOSIS — E785 Hyperlipidemia, unspecified: Secondary | ICD-10-CM | POA: Insufficient documentation

## 2013-04-03 DIAGNOSIS — M25819 Other specified joint disorders, unspecified shoulder: Secondary | ICD-10-CM | POA: Insufficient documentation

## 2013-04-03 DIAGNOSIS — G8918 Other acute postprocedural pain: Secondary | ICD-10-CM | POA: Diagnosis not present

## 2013-04-03 DIAGNOSIS — I252 Old myocardial infarction: Secondary | ICD-10-CM | POA: Insufficient documentation

## 2013-04-03 DIAGNOSIS — M19019 Primary osteoarthritis, unspecified shoulder: Secondary | ICD-10-CM | POA: Diagnosis not present

## 2013-04-03 DIAGNOSIS — G4730 Sleep apnea, unspecified: Secondary | ICD-10-CM | POA: Diagnosis not present

## 2013-04-03 HISTORY — PX: SHOULDER ARTHROSCOPY WITH SUBACROMIAL DECOMPRESSION: SHX5684

## 2013-04-03 LAB — GLUCOSE, CAPILLARY
Glucose-Capillary: 145 mg/dL — ABNORMAL HIGH (ref 70–99)
Glucose-Capillary: 127 mg/dL — ABNORMAL HIGH (ref 70–99)

## 2013-04-03 SURGERY — SHOULDER ARTHROSCOPY WITH SUBACROMIAL DECOMPRESSION
Anesthesia: General | Site: Shoulder | Laterality: Right

## 2013-04-03 MED ORDER — FENTANYL CITRATE 0.05 MG/ML IJ SOLN
INTRAMUSCULAR | Status: AC
  Administered 2013-04-03: 100 ug via INTRAVENOUS
  Filled 2013-04-03: qty 2

## 2013-04-03 MED ORDER — FENTANYL CITRATE 0.05 MG/ML IJ SOLN
100.0000 ug | Freq: Once | INTRAMUSCULAR | Status: AC
  Administered 2013-04-03: 100 ug via INTRAVENOUS

## 2013-04-03 MED ORDER — NAPROXEN 500 MG PO TABS: 500.0000 mg | ORAL_TABLET | Freq: Two times a day (BID) | ORAL | Status: DC

## 2013-04-03 MED ORDER — DIAZEPAM 5 MG PO TABS: 2.5000 mg | ORAL_TABLET | Freq: Four times a day (QID) | ORAL | Status: DC | PRN

## 2013-04-03 MED ORDER — PHENYLEPHRINE HCL 10 MG/ML IJ SOLN
INTRAMUSCULAR | Status: DC | PRN
  Administered 2013-04-03 (×2): 80 ug via INTRAVENOUS
  Administered 2013-04-03: 120 ug via INTRAVENOUS

## 2013-04-03 MED ORDER — ONDANSETRON HCL 4 MG/2ML IJ SOLN
INTRAMUSCULAR | Status: DC | PRN
  Administered 2013-04-03: 4 mg via INTRAVENOUS

## 2013-04-03 MED ORDER — OXYCODONE-ACETAMINOPHEN 5-325 MG PO TABS: 1.0000 | ORAL_TABLET | ORAL | Status: DC | PRN

## 2013-04-03 MED ORDER — HYDROMORPHONE HCL PF 1 MG/ML IJ SOLN: 0.2500 mg | INTRAMUSCULAR | Status: DC | PRN

## 2013-04-03 MED ORDER — FENTANYL CITRATE 0.05 MG/ML IJ SOLN
INTRAMUSCULAR | Status: DC | PRN
  Administered 2013-04-03 (×2): 50 ug via INTRAVENOUS

## 2013-04-03 MED ORDER — NEOSTIGMINE METHYLSULFATE 1 MG/ML IJ SOLN
INTRAMUSCULAR | Status: DC | PRN
  Administered 2013-04-03: 3 mg via INTRAVENOUS

## 2013-04-03 MED ORDER — LIDOCAINE HCL (CARDIAC) 20 MG/ML IV SOLN
INTRAVENOUS | Status: DC | PRN
  Administered 2013-04-03: 100 mg via INTRAVENOUS

## 2013-04-03 MED ORDER — GLYCOPYRROLATE 0.2 MG/ML IJ SOLN
INTRAMUSCULAR | Status: DC | PRN
  Administered 2013-04-03: 0.4 mg via INTRAVENOUS

## 2013-04-03 MED ORDER — ARTIFICIAL TEARS OP OINT
TOPICAL_OINTMENT | OPHTHALMIC | Status: DC | PRN
  Administered 2013-04-03: 1 via OPHTHALMIC

## 2013-04-03 MED ORDER — ONDANSETRON HCL 4 MG/2ML IJ SOLN
4.0000 mg | Freq: Once | INTRAMUSCULAR | Status: DC | PRN
Start: 2013-04-03 — End: 2013-04-03

## 2013-04-03 MED ORDER — MIDAZOLAM HCL 5 MG/5ML IJ SOLN
INTRAMUSCULAR | Status: DC | PRN
  Administered 2013-04-03 (×2): 1 mg via INTRAVENOUS

## 2013-04-03 MED ORDER — ROCURONIUM BROMIDE 100 MG/10ML IV SOLN
INTRAVENOUS | Status: DC | PRN
  Administered 2013-04-03: 35 mg via INTRAVENOUS

## 2013-04-03 MED ORDER — LACTATED RINGERS IV SOLN
INTRAVENOUS | Status: DC | PRN
  Administered 2013-04-03: 12:00:00 via INTRAVENOUS

## 2013-04-03 MED ORDER — PROPOFOL 10 MG/ML IV BOLUS
INTRAVENOUS | Status: DC | PRN
  Administered 2013-04-03: 200 mg via INTRAVENOUS

## 2013-04-03 MED ORDER — LACTATED RINGERS IV SOLN
INTRAVENOUS | Status: DC
  Administered 2013-04-03: 11:00:00 via INTRAVENOUS

## 2013-04-03 MED ORDER — EPHEDRINE SULFATE 50 MG/ML IJ SOLN
INTRAMUSCULAR | Status: DC | PRN
  Administered 2013-04-03: 5 mg via INTRAVENOUS
  Administered 2013-04-03: 10 mg via INTRAVENOUS
  Administered 2013-04-03: 5 mg via INTRAVENOUS
  Administered 2013-04-03: 10 mg via INTRAVENOUS

## 2013-04-03 SURGICAL SUPPLY — 64 items
BLADE CUTTER GATOR 3.5 (BLADE) ×3 IMPLANT
BLADE GREAT WHITE 4.2 (BLADE) ×2 IMPLANT
BLADE GREAT WHITE 4.2MM (BLADE) ×1
BLADE SURG 11 STRL SS (BLADE) ×3 IMPLANT
BOOTCOVER CLEANROOM LRG (PROTECTIVE WEAR) ×6 IMPLANT
BUR 3.5 LG SPHERICAL (BURR) IMPLANT
BUR OVAL 4.0 (BURR) ×3 IMPLANT
BURR 3.5 LG SPHERICAL (BURR)
BURR 3.5MM LG SPHERICAL (BURR)
CANISTER SUCT LVC 12 LTR MEDI- (MISCELLANEOUS) ×3 IMPLANT
CANNULA ACUFLEX KIT 5X76 (CANNULA) ×3 IMPLANT
CANNULA DRILOCK 5.0MMX75MM (CANNULA)
CANNULA DRILOCK 5.0X75 (CANNULA) IMPLANT
CLOSURE WOUND 1/2 X4 (GAUZE/BANDAGES/DRESSINGS) ×1
CLOTH BEACON ORANGE TIMEOUT ST (SAFETY) ×3 IMPLANT
CONNECTOR 5 IN 1 STRAIGHT STRL (MISCELLANEOUS) ×3 IMPLANT
DRAPE INCISE 23X17 IOBAN STRL (DRAPES) ×2
DRAPE INCISE 23X17 STRL (DRAPES) ×1 IMPLANT
DRAPE INCISE IOBAN 23X17 STRL (DRAPES) ×1 IMPLANT
DRAPE INCISE IOBAN 66X45 STRL (DRAPES) ×3 IMPLANT
DRAPE STERI 35X30 U-POUCH (DRAPES) ×3 IMPLANT
DRAPE SURG 17X11 SM STRL (DRAPES) ×3 IMPLANT
DRAPE U-SHAPE 47X51 STRL (DRAPES) ×3 IMPLANT
DRSG PAD ABDOMINAL 8X10 ST (GAUZE/BANDAGES/DRESSINGS) ×4 IMPLANT
DURAPREP 26ML APPLICATOR (WOUND CARE) ×4 IMPLANT
ELECT REM PT RETURN 9FT ADLT (ELECTROSURGICAL) ×3
ELECTRODE REM PT RTRN 9FT ADLT (ELECTROSURGICAL) ×1 IMPLANT
GLOVE BIO SURGEON STRL SZ 6.5 (GLOVE) ×1 IMPLANT
GLOVE BIO SURGEON STRL SZ7 (GLOVE) ×2 IMPLANT
GLOVE BIO SURGEON STRL SZ7.5 (GLOVE) ×3 IMPLANT
GLOVE BIO SURGEON STRL SZ8 (GLOVE) ×3 IMPLANT
GLOVE BIO SURGEONS STRL SZ 6.5 (GLOVE) ×1
GLOVE EUDERMIC 7 POWDERFREE (GLOVE) ×3 IMPLANT
GLOVE SS BIOGEL STRL SZ 7.5 (GLOVE) ×1 IMPLANT
GLOVE SUPERSENSE BIOGEL SZ 7.5 (GLOVE) ×2
GOWN STRL NON-REIN LRG LVL3 (GOWN DISPOSABLE) ×3 IMPLANT
GOWN STRL REIN XL XLG (GOWN DISPOSABLE) ×12 IMPLANT
KIT BASIN OR (CUSTOM PROCEDURE TRAY) ×3 IMPLANT
KIT ROOM TURNOVER OR (KITS) ×3 IMPLANT
KIT SHOULDER TRACTION (DRAPES) ×3 IMPLANT
MANIFOLD NEPTUNE II (INSTRUMENTS) ×3 IMPLANT
NDL SPNL 18GX3.5 QUINCKE PK (NEEDLE) ×1 IMPLANT
NDL SUT 6 .5 CRC .975X.05 MAYO (NEEDLE) IMPLANT
NEEDLE MAYO TAPER (NEEDLE)
NEEDLE SPNL 18GX3.5 QUINCKE PK (NEEDLE) ×3 IMPLANT
NS IRRIG 1000ML POUR BTL (IV SOLUTION) ×3 IMPLANT
PACK SHOULDER (CUSTOM PROCEDURE TRAY) ×3 IMPLANT
PAD ARMBOARD 7.5X6 YLW CONV (MISCELLANEOUS) ×6 IMPLANT
SET ARTHROSCOPY TUBING (MISCELLANEOUS) ×3
SET ARTHROSCOPY TUBING LN (MISCELLANEOUS) ×1 IMPLANT
SLING ARM FOAM STRAP LRG (SOFTGOODS) ×2 IMPLANT
SLING ARM FOAM STRAP MED (SOFTGOODS) ×1 IMPLANT
SPONGE GAUZE 4X4 12PLY (GAUZE/BANDAGES/DRESSINGS) ×3 IMPLANT
SPONGE LAP 4X18 X RAY DECT (DISPOSABLE) ×3 IMPLANT
STRIP CLOSURE SKIN 1/2X4 (GAUZE/BANDAGES/DRESSINGS) ×2 IMPLANT
SUT MNCRL AB 3-0 PS2 18 (SUTURE) ×3 IMPLANT
SUT PDS AB 0 CT 36 (SUTURE) IMPLANT
SUT RETRIEVER GRASP 30 DEG (SUTURE) IMPLANT
SYR 20CC LL (SYRINGE) ×3 IMPLANT
TAPE PAPER 3X10 WHT MICROPORE (GAUZE/BANDAGES/DRESSINGS) ×3 IMPLANT
TOWEL OR 17X24 6PK STRL BLUE (TOWEL DISPOSABLE) ×3 IMPLANT
TOWEL OR 17X26 10 PK STRL BLUE (TOWEL DISPOSABLE) ×3 IMPLANT
WAND SUCTION MAX 4MM 90S (SURGICAL WAND) ×3 IMPLANT
WATER STERILE IRR 1000ML POUR (IV SOLUTION) ×3 IMPLANT

## 2013-04-03 NOTE — Anesthesia Preprocedure Evaluation (Signed)
Anesthesia Evaluation  Patient identified by MRN, date of birth, ID band Patient awake    Reviewed: Allergy & Precautions, H&P , NPO status , Patient's Chart, lab work & pertinent test results  Airway       Dental   Pulmonary sleep apnea , former smoker,          Cardiovascular + CAD, + Past MI and + Cardiac Stents     Neuro/Psych    GI/Hepatic GERD-  ,  Endo/Other  diabetes, Type 2, Oral Hypoglycemic Agents  Renal/GU      Musculoskeletal   Abdominal   Peds  Hematology   Anesthesia Other Findings   Reproductive/Obstetrics                           Anesthesia Physical Anesthesia Plan  ASA: III  Anesthesia Plan: General   Post-op Pain Management:    Induction: Intravenous  Airway Management Planned: Oral ETT  Additional Equipment:   Intra-op Plan:   Post-operative Plan: Extubation in OR  Informed Consent: I have reviewed the patients History and Physical, chart, labs and discussed the procedure including the risks, benefits and alternatives for the proposed anesthesia with the patient or authorized representative who has indicated his/her understanding and acceptance.     Plan Discussed with:   Anesthesia Plan Comments:         Anesthesia Quick Evaluation

## 2013-04-03 NOTE — Anesthesia Postprocedure Evaluation (Signed)
  Anesthesia Post-op Note  Patient: Justin Carr  Procedure(s) Performed: Procedure(s): RIGHT SHOULDER ARTHROSCOPY WITH SUBACROMIAL DECOMPRESSION/DISTAL CLAVICLE RESECTION (Right)  Patient Location: PACU  Anesthesia Type:GA combined with regional for post-op pain  Level of Consciousness: awake, alert , oriented and patient cooperative  Airway and Oxygen Therapy: Patient Spontanous Breathing  Post-op Pain: mild  Post-op Assessment: Post-op Vital signs reviewed, Patient's Cardiovascular Status Stable, Respiratory Function Stable, Patent Airway, No signs of Nausea or vomiting and Pain level controlled  Post-op Vital Signs: stable  Complications: No apparent anesthesia complications

## 2013-04-03 NOTE — H&P (Signed)
Justin Carr    Chief Complaint: right shoulder impingement HPI: The patient is a 68 y.o. male with chronic right shoulder impingement refractory to conservative maangement  Past Medical History  Diagnosis Date  . Coronary artery disease   . Diabetes mellitus without complication   . Myocardial infarction   . GERD (gastroesophageal reflux disease)   . Hyperlipidemia   . Sleep apnea     Past Surgical History  Procedure Laterality Date  . Wrist surgery Right   . Coronary artery bypass graft      As of 03/2013 there is only mention of a DES to LAD in 2009    History reviewed. No pertinent family history.  Social History:  reports that he quit smoking about 6 years ago. He does not have any smokeless tobacco history on file. He reports that he does not drink alcohol or use illicit drugs.  Allergies:  Allergies  Allergen Reactions  . Peanuts [Peanut Oil] Anaphylaxis    Medications Prior to Admission  Medication Sig Dispense Refill  . aspirin 325 MG tablet Take 325 mg by mouth daily.      . beta carotene w/minerals (OCUVITE) tablet Take 1 tablet by mouth daily.      . clopidogrel (PLAVIX) 75 MG tablet Take 75 mg by mouth daily with breakfast.      . metFORMIN (GLUCOPHAGE) 1000 MG tablet Take 1,000 mg by mouth 2 (two) times daily with a meal.      . ramipril (ALTACE) 2.5 MG capsule Take 2.5 mg by mouth daily.      . ranitidine (ZANTAC) 150 MG tablet Take 150 mg by mouth 2 (two) times daily.      . rosuvastatin (CRESTOR) 10 MG tablet Take 10 mg by mouth daily.      . sildenafil (REVATIO) 20 MG tablet Take 20-100 mg by mouth as needed (for erectile disfunction).          Physical Exam: right shoulder with painful and restricted motion as noted at recent office visits.  Vitals  Temp:  [97.6 F (36.4 C)] 97.6 F (36.4 C) (01/09 1031) Pulse Rate:  [60-72] 66 (01/09 1132) Resp:  [9-20] 15 (01/09 1132) BP: (153-167)/(53-82) 154/53 mmHg (01/09 1131) SpO2:  [94 %-100 %] 98 %  (01/09 1132)  Assessment/Plan  Impression: right shoulder impingement  Plan of Action: Procedure(s): RIGHT SHOULDER ARTHROSCOPY WITH SUBACROMIAL DECOMPRESSION/DISTAL CLAVICLE RESECTION  Riley Papin M 04/03/2013, 11:37 AM

## 2013-04-03 NOTE — Transfer of Care (Signed)
Immediate Anesthesia Transfer of Care Note  Patient: Justin Carr  Procedure(s) Performed: Procedure(s): RIGHT SHOULDER ARTHROSCOPY WITH SUBACROMIAL DECOMPRESSION/DISTAL CLAVICLE RESECTION (Right)  Patient Location: PACU  Anesthesia Type:General  Level of Consciousness: awake, alert  and oriented  Airway & Oxygen Therapy: Patient Spontanous Breathing and Patient connected to nasal cannula oxygen  Post-op Assessment: Report given to PACU RN, Post -op Vital signs reviewed and stable and Patient moving all extremities X 4  Post vital signs: Reviewed and stable  Complications: No apparent anesthesia complications

## 2013-04-03 NOTE — Preoperative (Signed)
Beta Blockers   Reason not to administer Beta Blockers:Not Applicable 

## 2013-04-03 NOTE — Anesthesia Procedure Notes (Addendum)
Anesthesia Regional Block:   Narrative:    Anesthesia Regional Block:  Interscalene brachial plexus block  Pre-Anesthetic Checklist: ,, timeout performed, Correct Patient, Correct Site, Correct Laterality, Correct Procedure, Correct Position, site marked, Risks and benefits discussed,  Surgical consent,  Pre-op evaluation,  At surgeon's request and post-op pain management   Prep: chloraprep and alcohol swabs       Needles:  Injection technique: Single-shot  Needle Type: Stimulator Needle - 40        Needle insertion depth: 4 cm   Additional Needles:  Procedures: nerve stimulator Interscalene brachial plexus block  Nerve Stimulator or Paresthesia:  Response: 0.5 mA, 0.1 ms, 4 cm  Additional Responses:   Narrative:  Start time: 04/03/2013 11:30 AM End time: 04/03/2013 11:35 AM Injection made incrementally with aspirations every 5 mL.  Performed by: Personally  Anesthesiologist: Sharolyn Douglas MD  Additional Notes: Pt accepts procedure w/ risks. 16cc 0.5% Marcaine w/ epi w/o difficulty or discomfort. GES   Procedure Name: Intubation Date/Time: 04/03/2013 11:58 AM Performed by: Erik Obey Pre-anesthesia Checklist: Patient identified, Timeout performed, Emergency Drugs available, Suction available and Patient being monitored Patient Re-evaluated:Patient Re-evaluated prior to inductionOxygen Delivery Method: Circle system utilized Preoxygenation: Pre-oxygenation with 100% oxygen Intubation Type: IV induction Ventilation: Mask ventilation without difficulty and Oral airway inserted - appropriate to patient size Laryngoscope Size: Mac and 3 Grade View: Grade I Tube type: Oral Tube size: 7.5 mm Number of attempts: 1 Airway Equipment and Method: Stylet Placement Confirmation: ETT inserted through vocal cords under direct vision,  positive ETCO2 and breath sounds checked- equal and bilateral Secured at: 22 cm Tube secured with: Tape Dental Injury: Teeth and  Oropharynx as per pre-operative assessment

## 2013-04-03 NOTE — Discharge Instructions (Signed)
° °Kevin M. Supple, M.D., F.A.A.O.S. °Orthopaedic Surgery °Specializing in Arthroscopic and Reconstructive °Surgery of the Shoulder and Knee °[phone] °[address] - [place], [place] [address] - Fax [phone] ° ° °POST-OP SHOULDER ARTHROSCOPY INSTRUCTIONS ° °1. Call the office at [phone] to schedule your first post-op appointment 7-10 days from the date of your surgery. ° °2. Leave the steri-strips in place over your incisions when performing dressing changes and showering. You may remove your dressings and begin showering 72 hours from surgery. You can expect drainage that is clear to bloody in nature that occasionally will soak through your dressings. If this occurs go ahead and perform a dressing change. The drainage should lessen daily and when there is no drainage from your incisions feel free to go without a dressing. ° °3. Wear your sling for comfort. You may come out of your sling for ad lib activity and even decide not to use the sling at all. If you find you are more comfortable in your sling, make sure you come out of your sling at least 3-4 times a day to do the exercises that are included below. ° °4. Range of motion to your elbow, wrist, and hand are encouraged 3-5 times daily. Exercise to your hand and fingers helps to reduce swelling you may experience. ° °5. Utilize ice to the shoulder 3-4 times minimum a day and additionally if you are experiencing pain. ° °6. You may drive when safely off narcotics and muscle relaxants. ° °7. If you had a block pre-operatively to provide post-op pain relief you may want to go ahead and begin utilizing your pain meds as your arm begins to wake up. Blocks can sometimes last up to 16-18 hours. If you are still pain-free prior to going to bed you may want to strongly consider taking a pain medication to avoid being awakened in the night with the onset of pain. A muscle relaxant is also provided for you should you experience muscle spasms. It  is recommended that if you are experiencing pain that your pain medication alone is not controlling, add the muscle relaxant along with the pain medication which can give additional pain relief. The first one to two days is generally the most severe of your pain and then should gradually decrease. As your pain lessens it is recommended that you decrease your use of the pain medications to an "as needed basis" only and to always comply with the recommended dosages of the pain medications. ° °8. Pain medications can produce constipation along with their use. If you experience this, the use of an over the counter stool softener or laxative daily is recommended.  ° °9. For additional questions or concerns, please do not hesitate to call the office. If after hours there is an answering service to forward your concerns to the physician on call. ° ° °POST-OP EXERCISES ° °The pendulum exercises should be performed while bending at the waist as far over as possible thereby letting gravity do the work for you. ° °Range of Motion Exercises: Pendulum (circular) ° °Repeat 20 times. Do 3 sessions per day. ° ° ° ° °Range of Motion Exercises: Pendulum (side-to-side) ° °Repeat 20 times. Do 3 sessions per day. ° ° ° °Range of Motion Exercises (self-stretching activities): ° °Slide arm up wall with palm toward you, moving closer to the wall. Hold for 5 seconds. ° °Repeat 10 times. Do 3 sessions per day. ° ° ° ° ° °What to eat: ° °For your   first meals, you should eat lightly; only small meals initially.  If you do not have nausea, you may eat larger meals.  Avoid spicy, greasy and heavy food.   ° °General Anesthesia, Adult, Care After  °Refer to this sheet in the next few weeks. These instructions provide you with information on caring for yourself after your procedure. Your health care provider may also give you more specific instructions. Your treatment has been planned according to current medical practices, but problems sometimes  occur. Call your health care provider if you have any problems or questions after your procedure.  °WHAT TO EXPECT AFTER THE PROCEDURE  °After the procedure, it is typical to experience:  °Sleepiness.  °Nausea and vomiting. °HOME CARE INSTRUCTIONS  °For the first 24 hours after general anesthesia:  °Have a responsible person with you.  °Do not drive a car. If you are alone, do not take public transportation.  °Do not drink alcohol.  °Do not take medicine that has not been prescribed by your health care provider.  °Do not sign important papers or make important decisions.  °You may resume a normal diet and activities as directed by your health care provider.  °Change bandages (dressings) as directed.  °If you have questions or problems that seem related to general anesthesia, call the hospital and ask for the anesthetist or anesthesiologist on call. °SEEK MEDICAL CARE IF:  °You have nausea and vomiting that continue the day after anesthesia.  °You develop a rash. °SEEK IMMEDIATE MEDICAL CARE IF:  °You have difficulty breathing.  °You have chest pain.  °You have any allergic problems. °Document Released: 06/18/2000 Document Revised: 11/12/2012 Document Reviewed: 09/25/2012  °ExitCare® Patient Information ©2014 [employer].  ° ° °

## 2013-04-03 NOTE — Op Note (Signed)
04/03/2013  1:17 PM  PATIENT:   Justin Carr  67 y.o. male  PRE-OPERATIVE DIAGNOSIS:  right shoulder impingement, ac joint oa  POST-OPERATIVE DIAGNOSIS:  Same with humeral head chondromalacia and labral tear   PROCEDURE:  RSA, labral debridement, chondroplasty, SAD, DCR  SURGEON:  Daesha Insco, Metta Clines M.D.  ASSISTANTS: Shuford pac   ANESTHESIA:   GET + ISB  EBL: min  SPECIMEN:  none  Drains: none   PATIENT DISPOSITION:  PACU - hemodynamically stable.    PLAN OF CARE: Discharge to home after PACU  Dictation# [id number]

## 2013-04-04 NOTE — Op Note (Signed)
NAMEORVIN, NETTER NO.:  [id number]  MEDICAL RECORD NO.:  [id number]  LOCATION:  [facility]                         FACILITY:  [facility]  PHYSICIAN:  Metta Clines. Sharonann Malbrough, M.D.  DATE OF BIRTH:  [birth year]  DATE OF PROCEDURE:  04/03/2013 DATE OF DISCHARGE:  04/03/2013                              OPERATIVE REPORT   PREOPERATIVE DIAGNOSES: 1. Chronic right shoulder impingement syndrome. 2. Right shoulder symptomatic AC joint arthropathy.  POSTOPERATIVE DIAGNOSES: 1. Chronic right shoulder impingement syndrome. 2. Right shoulder symptomatic AC joint arthropathy. 3. Right shoulder degenerative labral tear. 4. Chondromalacia of the humeral head.  PROCEDURES: 1. Right shoulder examination under anesthesia. 2. Right shoulder diagnostic arthroscopy. 3. Chondroplasty of the humeral head. 4. Debridement of labral tear. 5. Arthroscopic subacromial decompression and bursectomy. 6. Arthroscopic distal clavicle resection.  SURGEON:  Metta Clines. Shedric Fredericks, MD  ASSISTANT:  Reather Laurence. Shuford, PA-C.  ANESTHESIA:  General endotracheal as well as interscalene block.  ESTIMATED BLOOD LOSS:  Minimal.  DRAINS:  None.  HISTORY:  Mr. Shew is a 68 year old gentleman who has had chronic and progressive increasing right shoulder pain with impingement symptoms that have been refractory to prolonged attempts at conservative management.  Due to his ongoing pain and functional limitations, he is brought to the operating room at this time for planned right shoulder arthroscopy as described below.  Preoperatively, we counseled Mr. Kneece on the treatment options as well as risks versus benefits thereof.  Possible surgical complications were reviewed including potential for bleeding, infection, neurovascular injury, persistent pain, loss of motion, anesthetic complication, and possible need for additional surgery.  He understands and accepts and agrees with our planned procedure.  PROCEDURE IN  DETAIL:  After undergoing routine preop evaluation, the patient received prophylactic antibiotics.  An interscalene block was established in the holding area by the Anesthesia Department and placed supine on the op table, underwent smooth induction of general endotracheal anesthesia.  He was turned to left lateral decubitus position on a beanbag and appropriately padded and protected.  Right shoulder examination under anesthesia revealed full motion.  There were no instability patterns noted.  Right arm suspended at 70 degrees abduction on 10 pounds traction.  Right shoulder girdle region was sterilely prepped and draped in standard fashion.  Time-out was called. Posterior portal was established in glenohumeral joint and anterior portal was established under direct visualization.  We found a degenerative tear of the superior half of the labrum consistent with a type 1 SLAP lesion which was debrided with a shaver.  There was also grade 2 chondromalacia over the central portion of the humeral head with some loose chondral flaps which were debrided.  I would estimate that it was almost a third of the articular surfaces that were degenerative.  No instability patterns were noted.  The rotator cuff was carefully inspected and found to be intact.  Biceps tendon normal caliber.  No proximal or distal instability.  Fluid and instruments were then removed.  The arm was dropped down to 30 degrees of abduction with the arthroscope introduced in the subacromial space in the posterior portal and a direct lateral portal in the subacromial space.  Abundant dense bursal tissue and multiple adhesions were encountered and these were all divided and excised with a combination of a Stryker wand.  The wand was then used to remove the periosteum from the undersurface of the anterior half of the acromion.  The subacromial depression was performed with a bur creating a type 1 morphology.  Portal was then  established directly anterior to the distal clavicle and distal clavicle resection was performed with a bur.  Care was taken to confirm visualization of the entire circumference of the distal clavicle to ensure adequate removal of bone.  We then completed a subacromial/subdeltoid bursectomy.  Of note, he had a very dense prolific overgrowth of the bursal tissue and this was excised in entirety and then final hemostasis was obtained. The bursal surface of the rotator cuff was carefully inspected and found to be intact.  Fluid and instruments were then removed.  The portals were closed with Monocryl and Steri-Strips.  A dry dressing was taped at the right shoulder.  Right arm was placed in a sling.  The patient was awakened, extubated, and taken to recovery room in stable condition.  Jenetta Loges, PA-C was used as an Environmental consultant throughout this case and essential for help with positioning of the patient, positioning of the extremity, managed by the arthroscopic equipment, tissue manipulation, wound closure, and intraoperative decision making.     Metta Clines. Damyan Corne, M.D.     KMS/MEDQ  D:  04/03/2013  T:  04/04/2013  Job:  [id number]

## 2013-04-06 ENCOUNTER — Encounter (HOSPITAL_COMMUNITY): Payer: Self-pay | Admitting: Orthopedic Surgery

## 2013-04-10 DIAGNOSIS — Z9889 Other specified postprocedural states: Secondary | ICD-10-CM | POA: Diagnosis not present

## 2013-04-10 DIAGNOSIS — M25819 Other specified joint disorders, unspecified shoulder: Secondary | ICD-10-CM | POA: Diagnosis not present

## 2013-04-10 DIAGNOSIS — M25519 Pain in unspecified shoulder: Secondary | ICD-10-CM | POA: Diagnosis not present

## 2013-04-13 DIAGNOSIS — M25819 Other specified joint disorders, unspecified shoulder: Secondary | ICD-10-CM | POA: Diagnosis not present

## 2013-04-17 DIAGNOSIS — M25819 Other specified joint disorders, unspecified shoulder: Secondary | ICD-10-CM | POA: Diagnosis not present

## 2013-04-20 ENCOUNTER — Encounter: Payer: Self-pay | Admitting: Cardiovascular Disease

## 2013-04-21 ENCOUNTER — Ambulatory Visit (INDEPENDENT_AMBULATORY_CARE_PROVIDER_SITE_OTHER): Payer: Medicare Other | Admitting: Cardiovascular Disease

## 2013-04-21 ENCOUNTER — Encounter: Payer: Self-pay | Admitting: Cardiovascular Disease

## 2013-04-21 VITALS — BP 140/82 | HR 66 | Ht 71.0 in | Wt 213.2 lb

## 2013-04-21 DIAGNOSIS — E785 Hyperlipidemia, unspecified: Secondary | ICD-10-CM | POA: Diagnosis not present

## 2013-04-21 DIAGNOSIS — M25819 Other specified joint disorders, unspecified shoulder: Secondary | ICD-10-CM | POA: Diagnosis not present

## 2013-04-21 DIAGNOSIS — E119 Type 2 diabetes mellitus without complications: Secondary | ICD-10-CM | POA: Insufficient documentation

## 2013-04-21 DIAGNOSIS — I701 Atherosclerosis of renal artery: Secondary | ICD-10-CM | POA: Insufficient documentation

## 2013-04-21 DIAGNOSIS — I10 Essential (primary) hypertension: Secondary | ICD-10-CM

## 2013-04-21 DIAGNOSIS — G4733 Obstructive sleep apnea (adult) (pediatric): Secondary | ICD-10-CM | POA: Insufficient documentation

## 2013-04-21 DIAGNOSIS — I2510 Atherosclerotic heart disease of native coronary artery without angina pectoris: Secondary | ICD-10-CM | POA: Diagnosis not present

## 2013-04-21 MED ORDER — RANITIDINE HCL 150 MG PO TABS: 150.0000 mg | ORAL_TABLET | Freq: Two times a day (BID) | ORAL | Status: DC

## 2013-04-21 MED ORDER — RAMIPRIL 2.5 MG PO CAPS: 2.5000 mg | ORAL_CAPSULE | Freq: Every day | ORAL | Status: DC

## 2013-04-21 MED ORDER — CLOPIDOGREL BISULFATE 75 MG PO TABS: 75.0000 mg | ORAL_TABLET | Freq: Every day | ORAL | Status: DC

## 2013-04-21 NOTE — Assessment & Plan Note (Signed)
On statin therapy followed by his PCP 

## 2013-04-21 NOTE — Assessment & Plan Note (Signed)
Controlled on current medications 

## 2013-04-21 NOTE — Progress Notes (Signed)
04/21/2013 Justin Carr   [birth year]  [id number]  Primary Physician Shirline Frees, MD Primary Cardiologist: [name] MD Renae Gloss   HPI:  The patient is a delightful 68 year old mildly overweight married Caucasian male, father of 2 children (1 living), who I saw a year ago. 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 His problems include obstructive sleep apnea, on CPAP, hypertension, and hyperlipidemia. He denies chest pain or shortness of breath. He had a non-ST-segment-elevation myocardial infarction, July 02, 2007. He underwent PCI and stenting of his proximal LAD with a Taxus Liberte drug-eluting stent. He had also had a 60% right renal artery stenosis documented angiographically at that time, which we have been following by duplex ultrasound. His most recent lab work, performed April 11, 2012, revealed total cholesterol of 128, LDL of 62, and HDL of 43.as I saw him one year ago he remains clinically stable I denies chest pain or shortness of breath.    Current Outpatient Prescriptions  Medication Sig Dispense Refill  . aspirin 325 MG tablet Take 325 mg by mouth daily.      . beta carotene w/minerals (OCUVITE) tablet Take 1 tablet by mouth daily.      . clopidogrel (PLAVIX) 75 MG tablet Take 1 tablet (75 mg total) by mouth daily with breakfast.  90 tablet  3  . metFORMIN (GLUCOPHAGE) 500 MG tablet Take 1,000 mg by mouth 2 (two) times daily with a meal.      . naproxen (NAPROSYN) 500 MG tablet Take 1 tablet (500 mg total) by mouth 2 (two) times daily with a meal.  60 tablet  1  . ramipril (ALTACE) 2.5 MG capsule Take 1 capsule (2.5 mg total) by mouth daily.  90 capsule  3  . ranitidine (ZANTAC) 150 MG tablet Take 1 tablet (150 mg total) by mouth 2 (two) times daily.  180 tablet  3  . rosuvastatin (CRESTOR) 10 MG tablet Take 10 mg by mouth daily.      .  sildenafil (REVATIO) 20 MG tablet Take 20-100 mg by mouth as needed (for erectile disfunction).       . metFORMIN (GLUCOPHAGE) 1000 MG tablet Take 1,000 mg by mouth 2 (two) times daily with a meal.       No current facility-administered medications for this visit.    Allergies  Allergen Reactions  . Peanuts [Peanut Oil] Anaphylaxis    History   Social History  . Marital Status: Married    Spouse Name: N/A    Number of Children: N/A  . Years of Education: N/A   Occupational History  . Not on file.   Social History Main Topics  . Smoking status: Former Smoker    Quit date: 07/03/2006  . Smokeless tobacco: Not on file  . Alcohol Use: No  . Drug Use: No  . Sexual Activity: Not on file   Other Topics Concern  . Not on file   Social History Narrative  . No narrative on file     Review of Systems: General: negative for chills, fever, night sweats or weight changes.  Cardiovascular: negative for chest pain, dyspnea on exertion, edema, orthopnea, palpitations, paroxysmal nocturnal dyspnea or shortness of breath Dermatological: negative for rash Respiratory: negative for cough or wheezing Urologic: negative for hematuria Abdominal: negative for nausea, vomiting, diarrhea, bright red blood per rectum, melena, or hematemesis Neurologic: negative for visual  changes, syncope, or dizziness All other systems reviewed and are otherwise negative except as noted above.    Blood pressure 140/82, pulse 66, height 5\' 11"  (1.803 m), weight 213 lb 3.2 oz (96.707 kg).  General appearance: alert and no distress Neck: no adenopathy, no carotid bruit, no JVD, supple, symmetrical, trachea midline and thyroid not enlarged, symmetric, no tenderness/mass/nodules Lungs: clear to auscultation bilaterally Heart: regular rate and rhythm, S1, S2 normal, no murmur, click, rub or gallop Extremities: extremities normal, atraumatic, no cyanosis or edema  EKG normal sinus rhythm at 66 without ST or T  wave changes  ASSESSMENT AND PLAN:   Coronary artery disease Status post non-ST segment elevation myocardial infarction 07/02/07. He underwent PCI and stenting of his proximal LAD using a Taxus Liberte drug-eluting stent. He also had a 60% right renal artery stenosis at that time documented angiographically. His ejection fraction was 45-50% with mild low in toto lateral and apical hypokinesia. He denies chest pain or shortness of breath.  Renal artery stenosis The patient had a 60% angiographically documented right renal artery stenosis at the time of cardiac catheterization in 2009. The pelvis by duplex ultrasound annual basis last checked one year ago. At that time the renal aortic ratio was 3.07.  Essential hypertension Controlled on current medications  Hyperlipidemia On statin therapy followed by his PCP      [name] MD [facility], [facility] 04/21/2013 11:46 AM

## 2013-04-21 NOTE — Patient Instructions (Addendum)
Your physician recommends that you schedule a follow-up appointment in: 1 year  Your physician has requested that you have a renal artery duplex. During this test, an ultrasound is used to evaluate blood flow to the kidneys. Allow one hour for this exam. Do not eat after midnight the day before and avoid carbonated beverages. Take your medications as you usually do. Lucretia Field

## 2013-04-21 NOTE — Assessment & Plan Note (Signed)
Status post non-ST segment elevation myocardial infarction 07/02/07. He underwent PCI and stenting of his proximal LAD using a Taxus Liberte drug-eluting stent. He also had a 60% right renal artery stenosis at that time documented angiographically. His ejection fraction was 45-50% with mild low in toto lateral and apical hypokinesia. He denies chest pain or shortness of breath.

## 2013-04-21 NOTE — Assessment & Plan Note (Signed)
The patient had a 60% angiographically documented right renal artery stenosis at the time of cardiac catheterization in 2009. The pelvis by duplex ultrasound annual basis last checked one year ago. At that time the renal aortic ratio was 3.07.

## 2013-04-22 ENCOUNTER — Telehealth: Payer: Self-pay | Admitting: *Deleted

## 2013-04-22 DIAGNOSIS — Z79899 Other long term (current) drug therapy: Secondary | ICD-10-CM

## 2013-04-22 DIAGNOSIS — E785 Hyperlipidemia, unspecified: Secondary | ICD-10-CM

## 2013-04-22 NOTE — Telephone Encounter (Signed)
Patient called to tell what medications the insurance company wants [place] to replace the crestor 10mg  with.  Insurance wants simvastatin, or pravastatin, or lovastatin, or atorvastatin, or fluvastatin. I will review with Justin Carr and send in the RX to [facility] per patient's request.

## 2013-04-22 NOTE — Telephone Encounter (Signed)
Switch to atorvastatin 20mg  qd, repeat lipids in 3 months

## 2013-04-23 MED ORDER — ATORVASTATIN CALCIUM 20 MG PO TABS: 20.0000 mg | ORAL_TABLET | Freq: Every day | ORAL | Status: DC

## 2013-04-23 NOTE — Telephone Encounter (Signed)
Pt aware of change of medication

## 2013-04-24 DIAGNOSIS — M25819 Other specified joint disorders, unspecified shoulder: Secondary | ICD-10-CM | POA: Diagnosis not present

## 2013-04-28 DIAGNOSIS — M25819 Other specified joint disorders, unspecified shoulder: Secondary | ICD-10-CM | POA: Diagnosis not present

## 2013-04-30 DIAGNOSIS — M25819 Other specified joint disorders, unspecified shoulder: Secondary | ICD-10-CM | POA: Diagnosis not present

## 2013-05-04 DIAGNOSIS — M25819 Other specified joint disorders, unspecified shoulder: Secondary | ICD-10-CM | POA: Diagnosis not present

## 2013-05-07 DIAGNOSIS — M25819 Other specified joint disorders, unspecified shoulder: Secondary | ICD-10-CM | POA: Diagnosis not present

## 2013-05-12 ENCOUNTER — Encounter (HOSPITAL_COMMUNITY): Payer: Medicare Other

## 2013-05-12 DIAGNOSIS — M25819 Other specified joint disorders, unspecified shoulder: Secondary | ICD-10-CM | POA: Diagnosis not present

## 2013-05-13 ENCOUNTER — Inpatient Hospital Stay (HOSPITAL_COMMUNITY): Admission: RE | Admit: 2013-05-13 | Payer: Medicare Other | Source: Ambulatory Visit

## 2013-05-14 ENCOUNTER — Telehealth (HOSPITAL_COMMUNITY): Payer: Self-pay | Admitting: *Deleted

## 2013-05-14 DIAGNOSIS — I701 Atherosclerosis of renal artery: Secondary | ICD-10-CM

## 2013-05-14 DIAGNOSIS — M25819 Other specified joint disorders, unspecified shoulder: Secondary | ICD-10-CM | POA: Diagnosis not present

## 2013-05-15 ENCOUNTER — Encounter (HOSPITAL_COMMUNITY): Payer: Medicare Other

## 2013-05-18 ENCOUNTER — Ambulatory Visit (HOSPITAL_COMMUNITY)
Admission: RE | Admit: 2013-05-18 | Discharge: 2013-05-18 | Disposition: A | Discharge status: Home / Self Care | Payer: Medicare Other | Source: Ambulatory Visit | Attending: Cardiovascular Disease | Admitting: Cardiovascular Disease

## 2013-05-18 DIAGNOSIS — I10 Essential (primary) hypertension: Secondary | ICD-10-CM

## 2013-05-18 DIAGNOSIS — I701 Atherosclerosis of renal artery: Secondary | ICD-10-CM | POA: Insufficient documentation

## 2013-05-18 NOTE — Progress Notes (Signed)
Renal Duplex Completed. [name], BS, RDMS, RVT  

## 2013-05-19 DIAGNOSIS — M25819 Other specified joint disorders, unspecified shoulder: Secondary | ICD-10-CM | POA: Diagnosis not present

## 2013-05-25 DIAGNOSIS — M25819 Other specified joint disorders, unspecified shoulder: Secondary | ICD-10-CM | POA: Diagnosis not present

## 2013-05-27 DIAGNOSIS — M25819 Other specified joint disorders, unspecified shoulder: Secondary | ICD-10-CM | POA: Diagnosis not present

## 2013-05-31 ENCOUNTER — Encounter: Payer: Self-pay | Admitting: *Deleted

## 2013-05-31 NOTE — Telephone Encounter (Signed)
Message copied by Chauncy Lean on Sun May 31, 2013 10:53 PM ------      Message from: [name]      Created: Sun May 31, 2013  5:38 PM       No change from prior study. Repeat in 12 months. ------

## 2013-05-31 NOTE — Telephone Encounter (Signed)
Order placed for repeat renal dopplers in 1 year  

## 2013-06-04 DIAGNOSIS — M25819 Other specified joint disorders, unspecified shoulder: Secondary | ICD-10-CM | POA: Diagnosis not present

## 2013-06-10 DIAGNOSIS — M25819 Other specified joint disorders, unspecified shoulder: Secondary | ICD-10-CM | POA: Diagnosis not present

## 2013-07-14 DIAGNOSIS — G4733 Obstructive sleep apnea (adult) (pediatric): Secondary | ICD-10-CM | POA: Diagnosis not present

## 2013-07-14 DIAGNOSIS — G4737 Central sleep apnea in conditions classified elsewhere: Secondary | ICD-10-CM | POA: Diagnosis not present

## 2013-07-21 DIAGNOSIS — L821 Other seborrheic keratosis: Secondary | ICD-10-CM | POA: Diagnosis not present

## 2013-07-21 DIAGNOSIS — L570 Actinic keratosis: Secondary | ICD-10-CM | POA: Diagnosis not present

## 2013-07-21 DIAGNOSIS — L909 Atrophic disorder of skin, unspecified: Secondary | ICD-10-CM | POA: Diagnosis not present

## 2013-07-21 DIAGNOSIS — L919 Hypertrophic disorder of the skin, unspecified: Secondary | ICD-10-CM | POA: Diagnosis not present

## 2013-08-25 DIAGNOSIS — IMO0001 Reserved for inherently not codable concepts without codable children: Secondary | ICD-10-CM | POA: Diagnosis not present

## 2013-09-29 ENCOUNTER — Telehealth: Payer: Self-pay | Admitting: Cardiovascular Disease

## 2013-09-29 NOTE — Telephone Encounter (Signed)
His crestor was switched to generic .  Having side effects so he would like to go back to crestor.  Please call.

## 2013-09-29 NOTE — Telephone Encounter (Signed)
RN spoke to patient. Patient states he was on Liptor for about 9 months about 6 years ago- develop side effect with muscle problems. He states he was placed on Crestor up until Jan 2015. Due to insurance - change back to Atorvastatin. Patient states he is developing problems again - He called his insurance company and per patient he can use Crestor but will have to pay a high co pay. Patient states he is willing to that. If Dr [name] is willing switch him back to CRESTOR Please send prescription to his mail order. Patient is aware ,will defer to Dr Cecil Cobbs

## 2013-09-29 NOTE — Telephone Encounter (Signed)
OK to switch back to Crestor

## 2013-09-29 NOTE — Telephone Encounter (Signed)
Dr [name], please advise if patient can restart Crestor

## 2013-09-30 ENCOUNTER — Telehealth: Payer: Self-pay | Admitting: Cardiovascular Disease

## 2013-09-30 MED ORDER — ROSUVASTATIN CALCIUM 10 MG PO TABS: 10.0000 mg | ORAL_TABLET | Freq: Every day | ORAL | Status: DC

## 2013-09-30 NOTE — Telephone Encounter (Signed)
You left a message for him this morning,he says he has a question to ask.

## 2013-09-30 NOTE — Telephone Encounter (Signed)
Left message on patient's answer machine. Medication(crestor) approved and sent to mail order.

## 2013-09-30 NOTE — Telephone Encounter (Signed)
Patient wanted to know if he needed to atorvastatin a few days prior to restarting Crestor. He states that is what he did last time. RN informed him it is not necessary but if he would like to give a break to see if discomfort goes away before starting CRESTOR. Patient states he will stop now  And wait until medication comes from [facility].

## 2013-10-21 ENCOUNTER — Ambulatory Visit: Payer: Medicare Other | Admitting: Podiatry

## 2013-10-21 ENCOUNTER — Encounter: Payer: Self-pay | Admitting: Podiatry

## 2013-10-21 VITALS — BP 189/111 | HR 63 | Resp 17

## 2013-10-21 DIAGNOSIS — I2510 Atherosclerotic heart disease of native coronary artery without angina pectoris: Secondary | ICD-10-CM

## 2013-10-21 DIAGNOSIS — B351 Tinea unguium: Secondary | ICD-10-CM

## 2013-10-21 DIAGNOSIS — L600 Ingrowing nail: Secondary | ICD-10-CM | POA: Diagnosis not present

## 2013-10-21 NOTE — Progress Notes (Signed)
   Subjective:    Patient ID: Justin Carr, male    DOB: 07-21-1945, 68 y.o.   MRN: [id number]  HPI Discoloration in bilateral big toenails, pain in left big toe, stabbing, intermittent   Review of Systems  Musculoskeletal: Positive for arthralgias.  Allergic/Immunologic: Positive for food allergies.  All other systems reviewed and are negative.      Objective:   Physical Exam        Assessment & Plan:

## 2013-10-21 NOTE — Patient Instructions (Signed)

## 2013-10-27 NOTE — Progress Notes (Signed)
Subjective:     Patient ID: Justin Carr, male   DOB: 17-Sep-1945, 68 y.o.   MRN: [id number]  Toe Pain    patient presents with discoloration of the big toenails and obvious trauma and also intermittent pain in the left one   Review of Systems  All other systems reviewed and are negative.      Objective:   Physical Exam  Nursing note and vitals reviewed. Constitutional: He is oriented to person, place, and time.  Cardiovascular: Intact distal pulses.   Musculoskeletal: Normal range of motion.  Neurological: He is oriented to person, place, and time.  Skin: Skin is warm.   neurovascular status intact with muscle strength adequate and range of motion subtalar midtarsal joint within normal limits. Patient's found to have some discoloration of the big toenails both feet with no significant underlying looseness noted    Assessment:     Trauma to the underlying nailbeds creating discoloration    Plan:     Reviewed H&P and discussed treatment options. We will allow these nails to grow out see how they do and decide if any other more aggressive treatment plan is necessary and I did debride nailbeds today. Reappoint as needed

## 2013-11-06 DIAGNOSIS — H3530 Unspecified macular degeneration: Secondary | ICD-10-CM | POA: Diagnosis not present

## 2013-11-06 DIAGNOSIS — E119 Type 2 diabetes mellitus without complications: Secondary | ICD-10-CM | POA: Diagnosis not present

## 2013-11-12 DIAGNOSIS — M5430 Sciatica, unspecified side: Secondary | ICD-10-CM | POA: Diagnosis not present

## 2013-12-31 DIAGNOSIS — R3911 Hesitancy of micturition: Secondary | ICD-10-CM | POA: Diagnosis not present

## 2013-12-31 DIAGNOSIS — Z0000 Encounter for general adult medical examination without abnormal findings: Secondary | ICD-10-CM | POA: Diagnosis not present

## 2013-12-31 DIAGNOSIS — Z125 Encounter for screening for malignant neoplasm of prostate: Secondary | ICD-10-CM | POA: Diagnosis not present

## 2013-12-31 DIAGNOSIS — E782 Mixed hyperlipidemia: Secondary | ICD-10-CM | POA: Diagnosis not present

## 2013-12-31 DIAGNOSIS — I2510 Atherosclerotic heart disease of native coronary artery without angina pectoris: Secondary | ICD-10-CM | POA: Diagnosis not present

## 2013-12-31 DIAGNOSIS — Z23 Encounter for immunization: Secondary | ICD-10-CM | POA: Diagnosis not present

## 2013-12-31 DIAGNOSIS — G4733 Obstructive sleep apnea (adult) (pediatric): Secondary | ICD-10-CM | POA: Diagnosis not present

## 2013-12-31 DIAGNOSIS — E139 Other specified diabetes mellitus without complications: Secondary | ICD-10-CM | POA: Diagnosis not present

## 2014-02-02 ENCOUNTER — Telehealth: Payer: Self-pay | Admitting: Cardiovascular Disease

## 2014-02-02 MED ORDER — NITROGLYCERIN 0.4 MG SL SUBL: 0.4000 mg | SUBLINGUAL_TABLET | SUBLINGUAL | Status: DC | PRN

## 2014-02-02 NOTE — Telephone Encounter (Signed)
Pt called in stating that he needs a new prescription for his NTG called in to the [facility] on [address].   Thanks

## 2014-02-02 NOTE — Telephone Encounter (Signed)
Spoke with patient to confirm that he is not having any chest pain. Patient stated his current bottle of NTG expired and he needed a new bottle. Rx refill sent to patient pharmacy

## 2014-02-24 DIAGNOSIS — E1165 Type 2 diabetes mellitus with hyperglycemia: Secondary | ICD-10-CM | POA: Diagnosis not present

## 2014-02-24 DIAGNOSIS — R002 Palpitations: Secondary | ICD-10-CM | POA: Diagnosis not present

## 2014-02-25 DIAGNOSIS — M545 Low back pain: Secondary | ICD-10-CM | POA: Diagnosis not present

## 2014-03-02 ENCOUNTER — Telehealth: Payer: Self-pay | Admitting: Cardiovascular Disease

## 2014-03-02 NOTE — Telephone Encounter (Signed)
Closed enconter

## 2014-03-24 ENCOUNTER — Encounter: Payer: Self-pay | Admitting: Cardiovascular Disease

## 2014-03-24 ENCOUNTER — Ambulatory Visit (INDEPENDENT_AMBULATORY_CARE_PROVIDER_SITE_OTHER): Payer: Medicare Other | Admitting: Cardiovascular Disease

## 2014-03-24 VITALS — BP 114/68 | HR 66 | Ht 71.0 in | Wt 210.2 lb

## 2014-03-24 DIAGNOSIS — I2510 Atherosclerotic heart disease of native coronary artery without angina pectoris: Secondary | ICD-10-CM

## 2014-03-24 DIAGNOSIS — G4733 Obstructive sleep apnea (adult) (pediatric): Secondary | ICD-10-CM

## 2014-03-24 DIAGNOSIS — E785 Hyperlipidemia, unspecified: Secondary | ICD-10-CM

## 2014-03-24 DIAGNOSIS — I701 Atherosclerosis of renal artery: Secondary | ICD-10-CM | POA: Diagnosis not present

## 2014-03-24 DIAGNOSIS — I10 Essential (primary) hypertension: Secondary | ICD-10-CM

## 2014-03-24 DIAGNOSIS — R002 Palpitations: Secondary | ICD-10-CM

## 2014-03-24 DIAGNOSIS — I2583 Coronary atherosclerosis due to lipid rich plaque: Secondary | ICD-10-CM

## 2014-03-24 NOTE — Assessment & Plan Note (Signed)
History of hyperlipidemia on Crestor 10 mg a day followed by his PCP

## 2014-03-24 NOTE — Progress Notes (Signed)
03/24/2014 Justin Carr   1946/03/12  [id number]  Primary Physician Shirline Frees, MD Primary Cardiologist: [name] MD Renae Gloss   HPI:  The patient is a delightful 68 year old mildly overweight married Caucasian male, father of 2 children (1 living), who I saw a year ago. 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 His problems include obstructive sleep apnea, on CPAP, hypertension, and hyperlipidemia. He denies chest pain or shortness of breath. He had a non-ST-segment-elevation myocardial infarction, July 02, 2007. He underwent PCI and stenting of his proximal LAD with a Taxus Liberte drug-eluting stent. He had also had a 60% right renal artery stenosis documented angiographically at that time, which we have been following by duplex ultrasound. Since I saw him back a year ago he denies chest pain or shortness of breath. He has had fairly new onset palpitations occurring over the last 2-3 months occurring several times a week.   Current Outpatient Prescriptions  Medication Sig Dispense Refill  . aspirin 325 MG tablet Take 325 mg by mouth daily.    . clopidogrel (PLAVIX) 75 MG tablet Take 1 tablet (75 mg total) by mouth daily with breakfast. 90 tablet 3  . metFORMIN (GLUCOPHAGE) 1000 MG tablet Take 1,000 mg by mouth 2 (two) times daily with a meal.    . Multiple Vitamin (MULTIVITAMIN WITH MINERALS) TABS tablet Take 1 tablet by mouth daily.    . Multiple Vitamins-Minerals (PRESERVISION AREDS 2 PO) Take 1 tablet by mouth 2 (two) times daily.    . nitroGLYCERIN (NITROSTAT) 0.4 MG SL tablet Place 1 tablet (0.4 mg total) under the tongue every 5 (five) minutes as needed for chest pain. 25 tablet 1  . ramipril (ALTACE) 2.5 MG capsule Take 1 capsule (2.5 mg total) by mouth daily. 90 capsule 3  . ranitidine (ZANTAC) 150 MG tablet Take 1 tablet (150 mg total) by mouth 2  (two) times daily. 180 tablet 3  . rosuvastatin (CRESTOR) 10 MG tablet Take 1 tablet (10 mg total) by mouth daily. 90 tablet 3  . sildenafil (REVATIO) 20 MG tablet Take 20-100 mg by mouth as needed (for erectile disfunction).      No current facility-administered medications for this visit.    Allergies  Allergen Reactions  . Peanuts [Peanut Oil] Anaphylaxis    History   Social History  . Marital Status: Married    Spouse Name: N/A    Number of Children: N/A  . Years of Education: N/A   Occupational History  . Not on file.   Social History Main Topics  . Smoking status: Former Smoker    Quit date: 07/03/2006  . Smokeless tobacco: Not on file  . Alcohol Use: No  . Drug Use: No  . Sexual Activity: Not on file   Other Topics Concern  . Not on file   Social History Narrative     Review of Systems: General: negative for chills, fever, night sweats or weight changes.  Cardiovascular: negative for chest pain, dyspnea on exertion, edema, orthopnea, palpitations, paroxysmal nocturnal dyspnea or shortness of breath Dermatological: negative for rash Respiratory: negative for cough or wheezing Urologic: negative for hematuria Abdominal: negative for nausea, vomiting, diarrhea, bright red blood per rectum, melena, or hematemesis Neurologic: negative for visual changes, syncope, or dizziness All other systems reviewed and are otherwise negative except as noted above.    Blood pressure 114/68, pulse 66,  height 5\' 11"  (1.803 m), weight 210 lb 3.2 oz (95.346 kg).  General appearance: alert and no distress Neck: no adenopathy, no carotid bruit, no JVD, supple, symmetrical, trachea midline and thyroid not enlarged, symmetric, no tenderness/mass/nodules Lungs: clear to auscultation bilaterally Heart: regular rate and rhythm, S1, S2 normal, no murmur, click, rub or gallop Extremities: extremities normal, atraumatic, no cyanosis or edema  EKG normal sinus rhythm at 66 without ST or  T-wave changes. I personally reviewed this EKG  ASSESSMENT AND PLAN:   Renal artery stenosis History of 60% right renal artery stenosis found at the time of cardiac catheterization 07/02/07 which we have been following by duplex ultrasound on annual basis. This was last checked 05/18/13 and found to be stable.  Obstructive sleep apnea History of obstructive sleep apnea on C Pap  Hyperlipidemia History of hyperlipidemia on Crestor 10 mg a day followed by his PCP  Essential hypertension History of hypertension with blood pressure measured today at 114/68 on ramipril 2.5 mg a day.  Coronary artery disease History of coronary artery disease status post non-ST segment elevation myocardial infarction 4/8/092 treated with PCI and stenting of his proximal LAD with a Taxus Liberte drug-eluting stent. He denies chest pain or shortness of breath.  Palpitations New-onset palpitations for the last 2 or 3 months occurring several times a week. I'm going to get a 2 week event monitor to document this      [name] MD [facility], [facility] 03/24/2014 3:04 PM

## 2014-03-24 NOTE — Patient Instructions (Signed)
Your physician wants you to follow-up in 1 year with Dr. [name]. You will receive a reminder letter in the mail 2 months in advance. If you do not receive a letter, please call our office to schedule the follow-up appointment.  Your Doctor has ordered you to wear a heart monitor. You will wear this for 14 days.   TIPS -  REMINDERS 1. The sensor is the lanyard that is worn around your neck every day - this is powered by a battery that needs to be changed every day 2. The monitor is the device that allows you to record symptoms - this will need to be charged daily 3. The sensor & monitor need to be within 100 feet of each other at all times 4. The sensor connects to the electrodes (stickers) - these should be changed every 24-48 hours (you do not have to remove them when you bathe, just make sure they are dry when you connect it back to the sensor 5. If you need more supplies (electrodes, batteries), please call the 1-800 # on the back of the pamphlet and [employer] will mail you more supplies 6. If your skin becomes sensitive, please try the sample pack of sensitive skin electrodes (the white packet in your silver box) and call [employer] to have them mail you more of these type of electrodes 7. When you are finish wearing the monitor, please place all supplies back in the silver box, place the silver box in the pre-packaged UPS bag and drop off at [employer] or call them so they can come pick it up   Cardiac Event Monitoring A cardiac event monitor is a small recording device used to help detect abnormal heart rhythms (arrhythmias). The monitor is used to record heart rhythm when noticeable symptoms such as the following occur:  Fast heartbeats (palpitations), such as heart racing or fluttering.  Dizziness.  Fainting or light-headedness.  Unexplained weakness. The monitor is wired to two electrodes placed on your chest. Electrodes are flat, sticky disks that attach to your skin. The monitor can be  worn for up to 30 days. You will wear the monitor at all times, except when bathing.  HOW TO USE YOUR CARDIAC EVENT MONITOR A technician will prepare your chest for the electrode placement. The technician will show you how to place the electrodes, how to work the monitor, and how to replace the batteries. Take time to practice using the monitor before you leave the office. Make sure you understand how to send the information from the monitor to your health care provider. This requires a telephone with a landline, not a cell phone. You need to:  Wear your monitor at all times, except when you are in water:  Do not get the monitor wet.  Take the monitor off when bathing. Do not swim or use a hot tub with it on.  Keep your skin clean. Do not put body lotion or moisturizer on your chest.  Change the electrodes daily or any time they stop sticking to your skin. You might need to use tape to keep them on.  It is possible that your skin under the electrodes could become irritated. To keep this from happening, try to put the electrodes in slightly different places on your chest. However, they must remain in the area under your left breast and in the upper right section of your chest.  Make sure the monitor is safely clipped to your clothing or in a location close to your  body that your health care provider recommends.  Press the button to record when you feel symptoms of heart trouble, such as dizziness, weakness, light-headedness, palpitations, thumping, shortness of breath, unexplained weakness, or a fluttering or racing heart. The monitor is always on and records what happened slightly before you pressed the button, so do not worry about being too late to get good information.  Keep a diary of your activities, such as walking, doing chores, and taking medicine. It is especially important to note what you were doing when you pushed the button to record your symptoms. This will help your health care  provider determine what might be contributing to your symptoms. The information stored in your monitor will be reviewed by your health care provider alongside your diary entries.  Send the recorded information as recommended by your health care provider. It is important to understand that it will take some time for your health care provider to process the results.  Change the batteries as recommended by your health care provider. SEEK IMMEDIATE MEDICAL CARE IF:   You have chest pain.  You have extreme difficulty breathing or shortness of breath.  You develop a very fast heartbeat that persists.  You develop dizziness that does not go away.  You faint or constantly feel you are about to faint. Document Released: 12/20/2007 Document Revised: 07/27/2013 Document Reviewed: 09/08/2012 [facility] Patient Information 2015 [place], [place]. This information is not intended to replace advice given to you by your health care provider. Make sure you discuss any questions you have with your health care provider.

## 2014-03-24 NOTE — Assessment & Plan Note (Signed)
History of coronary artery disease status post non-ST segment elevation myocardial infarction 4/8/092 treated with PCI and stenting of his proximal LAD with a Taxus Liberte drug-eluting stent. He denies chest pain or shortness of breath.

## 2014-03-24 NOTE — Assessment & Plan Note (Signed)
History of hypertension with blood pressure measured today at 114/68 on ramipril 2.5 mg a day.

## 2014-03-24 NOTE — Assessment & Plan Note (Signed)
History of 60% right renal artery stenosis found at the time of cardiac catheterization 07/02/07 which we have been following by duplex ultrasound on annual basis. This was last checked 05/18/13 and found to be stable.

## 2014-03-24 NOTE — Assessment & Plan Note (Signed)
History of obstructive sleep apnea on C Pap

## 2014-03-24 NOTE — Assessment & Plan Note (Signed)
New-onset palpitations for the last 2 or 3 months occurring several times a week. I'm going to get a 2 week event monitor to document this

## 2014-03-29 DIAGNOSIS — H43811 Vitreous degeneration, right eye: Secondary | ICD-10-CM | POA: Diagnosis not present

## 2014-04-07 DIAGNOSIS — R002 Palpitations: Secondary | ICD-10-CM

## 2014-04-29 ENCOUNTER — Telehealth (HOSPITAL_COMMUNITY): Payer: Self-pay | Admitting: *Deleted

## 2014-05-03 DIAGNOSIS — H43811 Vitreous degeneration, right eye: Secondary | ICD-10-CM | POA: Diagnosis not present

## 2014-05-17 ENCOUNTER — Encounter: Payer: Self-pay | Admitting: Cardiovascular Disease

## 2014-05-19 ENCOUNTER — Telehealth: Payer: Self-pay | Admitting: Cardiovascular Disease

## 2014-05-19 MED ORDER — RAMIPRIL 2.5 MG PO CAPS: 2.5000 mg | ORAL_CAPSULE | Freq: Every day | ORAL | Status: DC

## 2014-05-19 MED ORDER — CLOPIDOGREL BISULFATE 75 MG PO TABS: 75.0000 mg | ORAL_TABLET | Freq: Every day | ORAL | Status: DC

## 2014-05-19 MED ORDER — RANITIDINE HCL 150 MG PO TABS: 150.0000 mg | ORAL_TABLET | Freq: Two times a day (BID) | ORAL | Status: DC

## 2014-05-19 MED ORDER — ROSUVASTATIN CALCIUM 10 MG PO TABS: 10.0000 mg | ORAL_TABLET | Freq: Every day | ORAL | Status: DC

## 2014-05-19 NOTE — Telephone Encounter (Signed)
°  1. Which medications need to be refilled?Ramapril, Ranitidine, Clopedigrel([facility]), and Crestor([facility])   2. Which pharmacy is medication to be sent to?[facility]  3. Do they need a 30 day or 90 day supply? 90  4. Would they like a call back once the medication has been sent to the pharmacy? no

## 2014-05-19 NOTE — Telephone Encounter (Signed)
Rx(s) sent to pharmacy electronically.  

## 2014-05-25 ENCOUNTER — Ambulatory Visit (HOSPITAL_COMMUNITY)
Admission: RE | Admit: 2014-05-25 | Discharge: 2014-05-25 | Disposition: A | Discharge status: Home / Self Care | Payer: Medicare Other | Source: Ambulatory Visit | Attending: Cardiovascular Disease | Admitting: Cardiovascular Disease

## 2014-05-25 DIAGNOSIS — I701 Atherosclerosis of renal artery: Secondary | ICD-10-CM | POA: Diagnosis not present

## 2014-05-25 NOTE — Progress Notes (Signed)
Renal Artery Duplex Completed. °Brianna L Mazza,RVT °

## 2014-05-26 ENCOUNTER — Ambulatory Visit: Payer: Medicare Other | Admitting: Cardiovascular Disease

## 2014-06-01 ENCOUNTER — Telehealth: Payer: Self-pay | Admitting: *Deleted

## 2014-06-01 DIAGNOSIS — I701 Atherosclerosis of renal artery: Secondary | ICD-10-CM

## 2014-06-01 NOTE — Telephone Encounter (Signed)
-----   Message from [name], MD sent at 05/27/2014 10:31 AM EST ----- No change from prior study. Repeat in 6 months

## 2014-06-01 NOTE — Telephone Encounter (Signed)
Renal doppler results called to patient. Voiced understanding.  Would like to wait and have a repeat in one year unless he has problems.  Order placed. Event monitor results called to patient.  Voiced understanding.

## 2014-06-28 ENCOUNTER — Telehealth: Payer: Self-pay | Admitting: Cardiovascular Disease

## 2014-06-28 MED ORDER — NITROGLYCERIN 0.4 MG SL SUBL: 0.4000 mg | SUBLINGUAL_TABLET | SUBLINGUAL | Status: DC | PRN

## 2014-06-28 NOTE — Telephone Encounter (Signed)
°  1. Which medications need to be refilled? Nitroglycerin  2. Which pharmacy is medication to be sent to?[facility] -[id number] 3. Do they need a 30 day or 90 day supply? 30  4. Would they like a call back once the medication has been sent to the pharmacy? no

## 2014-06-28 NOTE — Telephone Encounter (Signed)
rx sent

## 2014-06-29 ENCOUNTER — Telehealth: Payer: Self-pay | Admitting: *Deleted

## 2014-06-29 NOTE — Telephone Encounter (Signed)
Erroneous encounter

## 2014-07-22 DIAGNOSIS — D2272 Melanocytic nevi of left lower limb, including hip: Secondary | ICD-10-CM | POA: Diagnosis not present

## 2014-07-22 DIAGNOSIS — D2261 Melanocytic nevi of right upper limb, including shoulder: Secondary | ICD-10-CM | POA: Diagnosis not present

## 2014-07-22 DIAGNOSIS — L821 Other seborrheic keratosis: Secondary | ICD-10-CM | POA: Diagnosis not present

## 2014-07-22 DIAGNOSIS — D224 Melanocytic nevi of scalp and neck: Secondary | ICD-10-CM | POA: Diagnosis not present

## 2014-07-22 DIAGNOSIS — L308 Other specified dermatitis: Secondary | ICD-10-CM | POA: Diagnosis not present

## 2014-07-22 DIAGNOSIS — D2262 Melanocytic nevi of left upper limb, including shoulder: Secondary | ICD-10-CM | POA: Diagnosis not present

## 2014-07-22 DIAGNOSIS — D225 Melanocytic nevi of trunk: Secondary | ICD-10-CM | POA: Diagnosis not present

## 2014-07-26 DIAGNOSIS — M533 Sacrococcygeal disorders, not elsewhere classified: Secondary | ICD-10-CM | POA: Diagnosis not present

## 2014-08-02 DIAGNOSIS — G4737 Central sleep apnea in conditions classified elsewhere: Secondary | ICD-10-CM | POA: Diagnosis not present

## 2014-08-02 DIAGNOSIS — G4733 Obstructive sleep apnea (adult) (pediatric): Secondary | ICD-10-CM | POA: Diagnosis not present

## 2014-08-30 DIAGNOSIS — E1165 Type 2 diabetes mellitus with hyperglycemia: Secondary | ICD-10-CM | POA: Diagnosis not present

## 2014-10-18 DIAGNOSIS — M25561 Pain in right knee: Secondary | ICD-10-CM | POA: Diagnosis not present

## 2014-11-15 DIAGNOSIS — H3531 Nonexudative age-related macular degeneration: Secondary | ICD-10-CM | POA: Diagnosis not present

## 2014-11-15 DIAGNOSIS — E119 Type 2 diabetes mellitus without complications: Secondary | ICD-10-CM | POA: Diagnosis not present

## 2015-01-03 DIAGNOSIS — I2510 Atherosclerotic heart disease of native coronary artery without angina pectoris: Secondary | ICD-10-CM | POA: Diagnosis not present

## 2015-01-03 DIAGNOSIS — N529 Male erectile dysfunction, unspecified: Secondary | ICD-10-CM | POA: Diagnosis not present

## 2015-01-03 DIAGNOSIS — Z125 Encounter for screening for malignant neoplasm of prostate: Secondary | ICD-10-CM | POA: Diagnosis not present

## 2015-01-03 DIAGNOSIS — I10 Essential (primary) hypertension: Secondary | ICD-10-CM | POA: Diagnosis not present

## 2015-01-03 DIAGNOSIS — E139 Other specified diabetes mellitus without complications: Secondary | ICD-10-CM | POA: Diagnosis not present

## 2015-01-03 DIAGNOSIS — Z23 Encounter for immunization: Secondary | ICD-10-CM | POA: Diagnosis not present

## 2015-01-03 DIAGNOSIS — Z0000 Encounter for general adult medical examination without abnormal findings: Secondary | ICD-10-CM | POA: Diagnosis not present

## 2015-01-03 DIAGNOSIS — E782 Mixed hyperlipidemia: Secondary | ICD-10-CM | POA: Diagnosis not present

## 2015-01-03 DIAGNOSIS — G4733 Obstructive sleep apnea (adult) (pediatric): Secondary | ICD-10-CM | POA: Diagnosis not present

## 2015-01-03 DIAGNOSIS — H6120 Impacted cerumen, unspecified ear: Secondary | ICD-10-CM | POA: Diagnosis not present

## 2015-01-06 DIAGNOSIS — E1165 Type 2 diabetes mellitus with hyperglycemia: Secondary | ICD-10-CM | POA: Diagnosis not present

## 2015-01-06 DIAGNOSIS — E1140 Type 2 diabetes mellitus with diabetic neuropathy, unspecified: Secondary | ICD-10-CM | POA: Diagnosis not present

## 2015-01-10 ENCOUNTER — Ambulatory Visit (INDEPENDENT_AMBULATORY_CARE_PROVIDER_SITE_OTHER): Payer: Medicare Other | Admitting: Neurology

## 2015-01-10 ENCOUNTER — Encounter: Payer: Self-pay | Admitting: Neurology

## 2015-01-10 VITALS — BP 148/83 | HR 64 | Ht 71.0 in | Wt 207.0 lb

## 2015-01-10 DIAGNOSIS — R202 Paresthesia of skin: Secondary | ICD-10-CM | POA: Diagnosis not present

## 2015-01-10 DIAGNOSIS — E1140 Type 2 diabetes mellitus with diabetic neuropathy, unspecified: Secondary | ICD-10-CM | POA: Diagnosis not present

## 2015-01-10 NOTE — Progress Notes (Signed)
PATIENT: Justin Carr DOB: [birth year]  Chief Complaint  Patient presents with  . Peripheral Neuropathy    He started experiencing painful, burning sensations in his bilateral feet approximately four years ago, shortly after he was diagnosed with diabetes.  His symptoms have continued to worsen and his PCP indicated he may need further testing.       HISTORICAL  Justin Carr is a 69 years old right-handed male, seen in refer by  Dr. Jacelyn Pi, MD, primary care Dr. Veverly Fells for evaluation of bilateral feet paresthesia  He has past medical history of hypertension, diabetes, hyperlipidemia, coronary artery disease, status post stent,  He complained of bilateral feet paresthesia since diagnosis of diabetes was made in 2011, initially he noticed the swollen sensation at the ball of bilateral feet, over the years, his bilateral feet paresthesia gradually getting worse, now numbness tingling spreading to the tip of his toes, he denies bilateral fingertips paresthesia, he denies significant low back pain, no bowel and bladder incontinence.  Most recent laboratory from his primary care physician report pendin REVIEW OF SYSTEMS: Full 14 system review of systems performed and notable only for as above  ALLERGIES: Allergies  Allergen Reactions  . Peanuts [Peanut Oil] Anaphylaxis    HOME MEDICATIONS: Current Outpatient Prescriptions  Medication Sig Dispense Refill  . aspirin 325 MG tablet Take 325 mg by mouth daily.    . clopidogrel (PLAVIX) 75 MG tablet Take 1 tablet (75 mg total) by mouth daily with breakfast. 90 tablet 3  . Coenzyme Q10 (CO Q 10 PO) Take by mouth daily.    . meloxicam (MOBIC) 15 MG tablet as needed.    . metFORMIN (GLUCOPHAGE) 1000 MG tablet Take 1,000 mg by mouth 2 (two) times daily with a meal.    . Multiple Vitamin (MULTIVITAMIN WITH MINERALS) TABS tablet Take 1 tablet by mouth daily.    . Multiple Vitamins-Minerals (PRESERVISION AREDS PO) Take by mouth daily.     . nitroGLYCERIN (NITROSTAT) 0.4 MG SL tablet Place 1 tablet (0.4 mg total) under the tongue every 5 (five) minutes as needed for chest pain. 25 tablet 0  . ramipril (ALTACE) 2.5 MG capsule Take 1 capsule (2.5 mg total) by mouth daily. 90 capsule 3  . ranitidine (ZANTAC) 150 MG tablet Take 1 tablet (150 mg total) by mouth 2 (two) times daily. 180 tablet 3  . rosuvastatin (CRESTOR) 10 MG tablet Take 1 tablet (10 mg total) by mouth daily. 90 tablet 3  . sildenafil (REVATIO) 20 MG tablet Take 20-100 mg by mouth as needed (for erectile disfunction).     . sitaGLIPtin (JANUVIA) 50 MG tablet Take 50 mg by mouth daily.     No current facility-administered medications for this visit.    PAST MEDICAL HISTORY: Past Medical History  Diagnosis Date  . Coronary artery disease   . Diabetes mellitus without complication ([place])   . Myocardial infarction ([place])   . GERD (gastroesophageal reflux disease)   . Hyperlipidemia   . Sleep apnea   . Hypertension   . Palpitations     PAST SURGICAL HISTORY: Past Surgical History  Procedure Laterality Date  . Wrist surgery Right   . Coronary artery bypass graft      As of 03/2013 there is only mention of a DES to LAD in 2009  . Shoulder arthroscopy with subacromial decompression Right 04/03/2013    Procedure: RIGHT SHOULDER ARTHROSCOPY WITH SUBACROMIAL DECOMPRESSION/DISTAL CLAVICLE RESECTION;  Surgeon: Marin Shutter, MD;  Location:  [facility];  Service: Orthopedics;  Laterality: Right;  . Cardiac catheterization  07/03/2007    LAD 90% ulcerated plaque proximal between 1st and 2nd branches stented with a 2.75x63mm Taxus Liberte stent deployed at 16atm. Postdilated at 16atm (3.62mm). resulting in reduction of 90% lesion to 0% residual with TIMI3 flow. Impingement on theostium of diag branch dilatedat 6atm resulting in reduction of 90% ostial D2 to <50% residual.  . Cardiovascular stress test  10/06/2007    No scintigraphic evidence of inducible myocardial ischemia. ECG  positive for ischemia. Pharmacologically induced ST segment depression. Perfusion defect seen in inferior myocardial region consistent with diaphragmatic attenuation.  . Transthoracic echocardiogram  10/06/2007    EF 63%, normal.  . Renal arterial doppler  05/20/2012    SMA and Cephalic artery >35% diameter reduction, Rt prox Renal artery 60-99% diameter reduction, Lft prox Renal artery 1-59% diameter reduction, kidneys are normal in size.  . Tonsillectomy and adenoidectomy      FAMILY HISTORY: Family History  Problem Relation Age of Onset  . Heart disease Mother   . Arrhythmia Father   . Heart attack Maternal Grandfather   . Parkinson's disease Paternal Grandfather   . Dementia Father     SOCIAL HISTORY:  Social History   Social History  . Marital Status: Married    Spouse Name: N/A  . Number of Children: 2  . Years of Education: College +   Occupational History  . Retired    Social History Main Topics  . Smoking status: Former Smoker    Quit date: 07/03/2006  . Smokeless tobacco: Not on file     Comment: Quit 2009  . Alcohol Use: No     Comment: 1-2 beers per week  . Drug Use: No  . Sexual Activity: Not on file   Other Topics Concern  . Not on file   Social History Narrative   1 cup coffee and 2 sodas per day.   Lives at home with his wife.   Right-handed.        PHYSICAL EXAM   Filed Vitals:   01/10/15 1539  BP: 148/83  Pulse: 64  Height: 5\' 11"  (1.803 m)  Weight: 207 lb (93.895 kg)    Not recorded      Body mass index is 28.88 kg/(m^2).  PHYSICAL EXAMNIATION:  Gen: NAD, conversant, well nourised, obese, well groomed                     Cardiovascular: Regular rate rhythm, no peripheral edema, warm, nontender. Eyes: Conjunctivae clear without exudates or hemorrhage Neck: Supple, no carotid bruise. Pulmonary: Clear to auscultation bilaterally   NEUROLOGICAL EXAM:  MENTAL STATUS: Speech:    Speech is normal; fluent and spontaneous with  normal comprehension.  Cognition:     Orientation to time, place and person     Normal recent and remote memory     Normal Attention span and concentration     Normal Language, naming, repeating,spontaneous speech     Fund of knowledge   CRANIAL NERVES: CN II: Visual fields are full to confrontation. Fundoscopic exam is normal with sharp discs and no vascular changes. Pupils are round equal and briskly reactive to light. CN III, IV, VI: extraocular movement are normal. No ptosis. CN V: Facial sensation is intact to pinprick in all 3 divisions bilaterally. Corneal responses are intact.  CN VII: Face is symmetric with normal eye closure and smile. CN VIII: Hearing is normal to rubbing fingers  CN IX, X: Palate elevates symmetrically. Phonation is normal. CN XI: Head turning and shoulder shrug are intact CN XII: Tongue is midline with normal movements and no atrophy.  MOTOR: There is no pronator drift of out-stretched arms. Muscle bulk and tone are normal. Muscle strength is normal.  REFLEXES: Reflexes are 2+ and symmetric at the biceps, triceps, knees, and ankles. Plantar responses are flexor.  SENSORY: Intact to light touch, pinprick, position sense, and vibration sense are intact in fingers and toes.  COORDINATION: Rapid alternating movements and fine finger movements are intact. There is no dysmetria on finger-to-nose and heel-knee-shin.    GAIT/STANCE: Posture is normal. Gait is steady with normal steps, base, arm swing, and turning. Heel and toe walking are normal. Tandem gait is normal.  Romberg is absent.   DIAGNOSTIC DATA (LABS, IMAGING, TESTING) - I reviewed patient records, labs, notes, testing and imaging myself where available.   ASSESSMENT AND PLAN  Hudson Majkowski is a 69 y.o. male   Bilateral feet paresthesia  Most consistent with small fiber neuropathy due to his diabetes, even though his diabetes is under good control  EMG nerve conduction study, potential skin  biopsy  Laboratory evaluations from primary care  Marcial Pacas, M.D. Ph.D.  [facility] [address] [place], [place] [address] Ph: [phone] Fax: [phone]  CC: Dr. Jacelyn Pi, MD, primary care Dr. Veverly Fells

## 2015-02-14 ENCOUNTER — Encounter: Payer: Medicare Other | Admitting: Neurology

## 2015-03-09 DIAGNOSIS — J069 Acute upper respiratory infection, unspecified: Secondary | ICD-10-CM | POA: Diagnosis not present

## 2015-03-17 ENCOUNTER — Ambulatory Visit (INDEPENDENT_AMBULATORY_CARE_PROVIDER_SITE_OTHER): Payer: Medicare Other | Admitting: Cardiovascular Disease

## 2015-03-17 ENCOUNTER — Encounter: Payer: Self-pay | Admitting: Cardiovascular Disease

## 2015-03-17 DIAGNOSIS — I10 Essential (primary) hypertension: Secondary | ICD-10-CM

## 2015-03-17 DIAGNOSIS — I701 Atherosclerosis of renal artery: Secondary | ICD-10-CM

## 2015-03-17 MED ORDER — CLOPIDOGREL BISULFATE 75 MG PO TABS: 75.0000 mg | ORAL_TABLET | Freq: Every day | ORAL | Status: DC

## 2015-03-17 MED ORDER — ROSUVASTATIN CALCIUM 10 MG PO TABS: 10.0000 mg | ORAL_TABLET | Freq: Every day | ORAL | Status: DC

## 2015-03-17 MED ORDER — RAMIPRIL 2.5 MG PO CAPS: 2.5000 mg | ORAL_CAPSULE | Freq: Every day | ORAL | Status: DC

## 2015-03-17 MED ORDER — RANITIDINE HCL 150 MG PO TABS: 150.0000 mg | ORAL_TABLET | Freq: Two times a day (BID) | ORAL | Status: DC

## 2015-03-17 NOTE — Assessment & Plan Note (Signed)
History of hyperlipidemia on statin therapy followed by his PCP 

## 2015-03-17 NOTE — Assessment & Plan Note (Signed)
History of CAD status post proximal LAD PCI and stenting using a Taxus Liberte drug-eluting stent 07/02/07 in the setting of a non-ST segment elevation myocardial infarction. He denies chest pain or shortness of breath.

## 2015-03-17 NOTE — Assessment & Plan Note (Signed)
History of hypertension blood pressure measured at 116/58. He is on ramipril. Continue current meds at current dosing

## 2015-03-17 NOTE — Assessment & Plan Note (Signed)
History of 60% right renal artery stenosis found angiographically the time of his MI 07/02/07 followed by duplex ultrasound.

## 2015-03-17 NOTE — Progress Notes (Signed)
03/17/2015 Justin Carr   May 23, 1945  [id number]  Primary Physician Shirline Frees, MD Primary Cardiologist: [name] MD Renae Gloss   HPI:  The patient is a delightful 69 year old mildly overweight married Caucasian male, father of 2 children (1 living), who I saw a year ago. 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 His problems include obstructive sleep apnea, on CPAP, hypertension, and hyperlipidemia. He denies chest pain or shortness of breath. He had a non-ST-segment-elevation myocardial infarction, July 02, 2007. He underwent PCI and stenting of his proximal LAD with a Taxus Liberte drug-eluting stent. He had also had a 60% right renal artery stenosis documented angiographically at that time, which we have been following by duplex ultrasound. Since I saw him back a year ago he denies chest pain or shortness of breath. Since I saw him one year ago he's been asymptomatic.    Current Outpatient Prescriptions  Medication Sig Dispense Refill  . aspirin 325 MG tablet Take 325 mg by mouth daily.    . clopidogrel (PLAVIX) 75 MG tablet Take 1 tablet (75 mg total) by mouth daily with breakfast. 90 tablet 3  . Coenzyme Q10 (CO Q 10 PO) Take by mouth daily.    . meloxicam (MOBIC) 15 MG tablet as needed.    . metFORMIN (GLUCOPHAGE) 1000 MG tablet Take 1,000 mg by mouth 2 (two) times daily with a meal.    . Multiple Vitamin (MULTIVITAMIN WITH MINERALS) TABS tablet Take 1 tablet by mouth daily.    . Multiple Vitamins-Minerals (PRESERVISION AREDS PO) Take by mouth daily.    . nitroGLYCERIN (NITROSTAT) 0.4 MG SL tablet Place 1 tablet (0.4 mg total) under the tongue every 5 (five) minutes as needed for chest pain. 25 tablet 0  . ramipril (ALTACE) 2.5 MG capsule Take 1 capsule (2.5 mg total) by mouth daily. 90 capsule 3  . ranitidine (ZANTAC) 150 MG tablet Take 1 tablet (150  mg total) by mouth 2 (two) times daily. 180 tablet 3  . rosuvastatin (CRESTOR) 10 MG tablet Take 1 tablet (10 mg total) by mouth daily. 90 tablet 3  . sildenafil (REVATIO) 20 MG tablet Take 20-100 mg by mouth as needed (for erectile disfunction).     . sitaGLIPtin (JANUVIA) 50 MG tablet Take 50 mg by mouth daily.     No current facility-administered medications for this visit.    Allergies  Allergen Reactions  . Peanuts [Peanut Oil] Anaphylaxis    Social History   Social History  . Marital Status: Married    Spouse Name: N/A  . Number of Children: 2  . Years of Education: College +   Occupational History  . Retired    Social History Main Topics  . Smoking status: Former Smoker    Quit date: 07/03/2006  . Smokeless tobacco: Not on file     Comment: Quit 2009  . Alcohol Use: No     Comment: 1-2 beers per week  . Drug Use: No  . Sexual Activity: Not on file   Other Topics Concern  . Not on file   Social History Narrative   1 cup coffee and 2 sodas per day.   Lives at home with his wife.   Right-handed.        Review of Systems: General: negative for chills, fever, night sweats or weight changes.  Cardiovascular: negative for chest pain, dyspnea on  exertion, edema, orthopnea, palpitations, paroxysmal nocturnal dyspnea or shortness of breath Dermatological: negative for rash Respiratory: negative for cough or wheezing Urologic: negative for hematuria Abdominal: negative for nausea, vomiting, diarrhea, bright red blood per rectum, melena, or hematemesis Neurologic: negative for visual changes, syncope, or dizziness All other systems reviewed and are otherwise negative except as noted above.    Blood pressure 116/58, pulse 63, height 6\' 2"  (1.88 m), weight 209 lb (94.802 kg).  General appearance: alert and no distress Neck: no adenopathy, no carotid bruit, no JVD, supple, symmetrical, trachea midline and thyroid not enlarged, symmetric, no  tenderness/mass/nodules Lungs: clear to auscultation bilaterally Heart: regular rate and rhythm, S1, S2 normal, no murmur, click, rub or gallop Extremities: extremities normal, atraumatic, no cyanosis or edema  EKG normal sinus rhythm at 63 with ST or T wave changes. There were occasional PACs. I proceeded reviewed this EKG  ASSESSMENT AND PLAN:   Renal artery stenosis History of 60% right renal artery stenosis found angiographically the time of his MI 07/02/07 followed by duplex ultrasound.  Hyperlipidemia History of hyperlipidemia on statin therapy followed by his PCP  Essential hypertension History of hypertension blood pressure measured at 116/58. He is on ramipril. Continue current meds at current dosing  Coronary artery disease History of CAD status post proximal LAD PCI and stenting using a Taxus Liberte drug-eluting stent 07/02/07 in the setting of a non-ST segment elevation myocardial infarction. He denies chest pain or shortness of breath.      [name] MD FACP,FACC,FAHA, [facility] 03/17/2015 10:56 AM

## 2015-03-17 NOTE — Patient Instructions (Signed)

## 2015-03-23 ENCOUNTER — Ambulatory Visit (INDEPENDENT_AMBULATORY_CARE_PROVIDER_SITE_OTHER): Payer: Medicare Other | Admitting: Neurology

## 2015-03-23 ENCOUNTER — Ambulatory Visit (INDEPENDENT_AMBULATORY_CARE_PROVIDER_SITE_OTHER): Payer: Self-pay | Admitting: Neurology

## 2015-03-23 DIAGNOSIS — R202 Paresthesia of skin: Secondary | ICD-10-CM | POA: Insufficient documentation

## 2015-03-23 DIAGNOSIS — E1140 Type 2 diabetes mellitus with diabetic neuropathy, unspecified: Secondary | ICD-10-CM

## 2015-03-23 DIAGNOSIS — Z0289 Encounter for other administrative examinations: Secondary | ICD-10-CM

## 2015-03-23 NOTE — Progress Notes (Signed)
EMG nerve conduction study today is normal, there is no evidence of large fiber peripheral neuropathy

## 2015-03-23 NOTE — Procedures (Signed)
   NCS (NERVE CONDUCTION STUDY) WITH EMG (ELECTROMYOGRAPHY) REPORT   STUDY DATE: December 28th 2016 PATIENT NAME: Justin Carr DOB: Feb 02, 1946 MRN: [id number]    TECHNOLOGIST: Laretta Alstrom ELECTROMYOGRAPHER: Marcial Pacas M.D.  CLINICAL INFORMATION:  69 years old male, presenting with bilateral feet paresthesia, history of well-controlled diabetes  FINDINGS: NERVE CONDUCTION STUDY: Bilateral peroneal, sural sensory responses were normal. Bilateral medial, lateral plantar sensory responses were present and symmetric.  Bilateral peroneal to EDB and the tibial motor responses were normal. Bilateral tibial H reflexes were present and symmetric.   NEEDLE ELECTROMYOGRAPHY: Selected needle examination was performed at right lower extremity muscles and right lumbosacral paraspinal muscles  Needle examination of right tibialis anterior, tibialis posterior, peroneal longus, vastus lateralis was normal  There was no spontaneous activity at right lumbosacral paraspinal muscles, right L4-5 S1  IMPRESSION: This is a normal study. There is no electrodiagnostic evidence of large fiber peripheral neuropathy or right lumbosacral radiculopathy   INTERPRETING PHYSICIAN:   Marcial Pacas M.D. Ph.D. [facility] [address] [place], [place] [address] [phone]

## 2015-04-20 ENCOUNTER — Encounter: Payer: Self-pay | Admitting: *Deleted

## 2015-04-20 ENCOUNTER — Ambulatory Visit (INDEPENDENT_AMBULATORY_CARE_PROVIDER_SITE_OTHER): Payer: Medicare Other | Admitting: Neurology

## 2015-04-20 ENCOUNTER — Encounter: Payer: Self-pay | Admitting: Neurology

## 2015-04-20 VITALS — BP 124/63 | HR 60 | Ht 74.0 in | Wt 211.0 lb

## 2015-04-20 DIAGNOSIS — E1140 Type 2 diabetes mellitus with diabetic neuropathy, unspecified: Secondary | ICD-10-CM | POA: Insufficient documentation

## 2015-04-20 DIAGNOSIS — R202 Paresthesia of skin: Secondary | ICD-10-CM

## 2015-04-20 DIAGNOSIS — E1142 Type 2 diabetes mellitus with diabetic polyneuropathy: Secondary | ICD-10-CM

## 2015-04-20 NOTE — Progress Notes (Signed)
Skin biopsy of right leg today, separate procedure note was documented  I reviewed laboratory evaluation, October 2016, LDL 47, cholesterol 112, HDL 36, normal TSH 1.16, normal CMP with exception of mild elevated glucose 105, normal CBC, with hemoglobin of 15.6

## 2015-04-20 NOTE — Procedures (Signed)
Patient was in left lateral recombinant position. Sterile technique. 1% lidocaine with epinephrine was used for local anesthesia. Punctuated skin biopsy was performed. 3 mm skin sample were obtained at right foot, above left extensor digitorum brevis, and right lateral calf, 10 cm above lateral malleolus, lateral thigh, 20 cm below superior iliac spine.  Patient tolerated the procedure well.  The wound was covered with neosporin antibiotic cream and bandage.

## 2015-04-28 ENCOUNTER — Telehealth: Payer: Self-pay | Admitting: *Deleted

## 2015-04-28 NOTE — Telephone Encounter (Signed)
Dr. Krista Blue has review these results.  Skin Biopsy Results (collected 04/20/15): A) R thigh - skin with normal epidermal nerve fiber density B) R calf - skin with significantly reduced epidermal nerve fiber density, consistent with small fiber neuropathy C) R foot - skin with significantly reduced epidermal nerve fiber density, consistent with small fiber neuropathy

## 2015-04-28 NOTE — Telephone Encounter (Signed)
Spoke to Justin Carr - he is aware of results.  Feels his symptoms are tolerable enough without medications.  He will call back with any further concerns.

## 2015-04-28 NOTE — Telephone Encounter (Signed)
Patient returned Michelle's call. °

## 2015-07-07 DIAGNOSIS — E1165 Type 2 diabetes mellitus with hyperglycemia: Secondary | ICD-10-CM | POA: Diagnosis not present

## 2015-07-07 DIAGNOSIS — E1140 Type 2 diabetes mellitus with diabetic neuropathy, unspecified: Secondary | ICD-10-CM | POA: Diagnosis not present

## 2015-07-08 ENCOUNTER — Other Ambulatory Visit: Payer: Self-pay | Admitting: Cardiovascular Disease

## 2015-07-08 NOTE — Telephone Encounter (Signed)
Rx(s) sent to pharmacy electronically.  

## 2015-07-22 DIAGNOSIS — D224 Melanocytic nevi of scalp and neck: Secondary | ICD-10-CM | POA: Diagnosis not present

## 2015-07-22 DIAGNOSIS — D2261 Melanocytic nevi of right upper limb, including shoulder: Secondary | ICD-10-CM | POA: Diagnosis not present

## 2015-07-22 DIAGNOSIS — L821 Other seborrheic keratosis: Secondary | ICD-10-CM | POA: Diagnosis not present

## 2015-07-22 DIAGNOSIS — D2271 Melanocytic nevi of right lower limb, including hip: Secondary | ICD-10-CM | POA: Diagnosis not present

## 2015-07-22 DIAGNOSIS — L438 Other lichen planus: Secondary | ICD-10-CM | POA: Diagnosis not present

## 2015-07-22 DIAGNOSIS — D225 Melanocytic nevi of trunk: Secondary | ICD-10-CM | POA: Diagnosis not present

## 2015-07-22 DIAGNOSIS — L918 Other hypertrophic disorders of the skin: Secondary | ICD-10-CM | POA: Diagnosis not present

## 2015-08-11 DIAGNOSIS — G4733 Obstructive sleep apnea (adult) (pediatric): Secondary | ICD-10-CM | POA: Diagnosis not present

## 2015-09-12 ENCOUNTER — Telehealth: Payer: Self-pay | Admitting: Cardiovascular Disease

## 2015-09-12 NOTE — Telephone Encounter (Signed)
New message     Pt c/o medication issue:  1. Name of Medication: Rosuvastatin  2. How are you currently taking this medication (dosage and times per day)? 10 mg po tablet  3. Are you having a reaction (difficulty breathing--STAT)? Yes muscle cramps,waking the pt up at night  4. What is your medication issue? The pt hasn't taken the medication since June 1st, since then the pain has gone away, the pt wants to know which medication he needs to switch to

## 2015-09-12 NOTE — Telephone Encounter (Signed)
LMTCB about statin intolerance.

## 2015-09-12 NOTE — Telephone Encounter (Signed)
Returned call to patient. He states he was previously on lipitor then changed to crestor for myalgias.   Stopped generic crestor - 2 weeks ago and has been feeling better.   His wife has been on another statin medication - Livalo. She had difficulty getting this medication covered by the insurance. So we will try three times weekly Crestor and see how he tolerates.   I am unsure if his insurance will be willing to cover [id number] without documented CK elevation.   He states he understands and will try Crestor 10mg  three times a week. He understands to call if he experiences myalgias with this dosing as well.

## 2015-09-12 NOTE — Telephone Encounter (Signed)
Pt explains 8 year history of statin use. Initially started on lipitor, but developed myalgia associated intolerance to lipitor. He was then put on crestor. 2 yrs ago, insurance co steered him to taking generic statin on preferred formulary - patient started back on lipitor. He then went back on crestor a 2nd time due to again having intolerance reaction to lipitor.  He started having nocturnal muscle cramps a month or so ago, went off the crestor 2 weeks ago, and the cramps have now resolved. He is asking for medication recommendation. Aware I will defer to pharmD for instruction.

## 2015-09-21 ENCOUNTER — Encounter: Payer: Self-pay | Admitting: Podiatry

## 2015-09-21 ENCOUNTER — Ambulatory Visit (INDEPENDENT_AMBULATORY_CARE_PROVIDER_SITE_OTHER): Payer: Medicare Other | Admitting: Podiatry

## 2015-09-21 VITALS — BP 167/85 | HR 74 | Resp 16

## 2015-09-21 DIAGNOSIS — L600 Ingrowing nail: Secondary | ICD-10-CM | POA: Diagnosis not present

## 2015-09-21 NOTE — Patient Instructions (Signed)

## 2015-09-22 NOTE — Progress Notes (Signed)
Subjective:     Patient ID: Justin Carr, male   DOB: Mar 26, 1946, 70 y.o.   MRN: [id number]  HPI patient presents stating the medial border my left big toe has been sore and making it hard to wear shoe gear comfortably. Patient states that he's tried to trim it and padding without relief   Review of Systems  All other systems reviewed and are negative.      Objective:   Physical Exam  Constitutional: He is oriented to person, place, and time.  Cardiovascular: Intact distal pulses.   Musculoskeletal: Normal range of motion.  Neurological: He is oriented to person, place, and time.  Skin: Skin is warm.  Nursing note and vitals reviewed.  neurovascular status intact muscle strength adequate range of motion within normal limits with patient noted to have incurvation of the left hallux nail medial border that's painful when pressed and making shoe gear difficult. Patient states this is been ongoing and gradually more of an issue     Assessment:     Ingrown toenail deformity left hallux medial border with pain    Plan:     H&P reviewed condition and recommended correction. I explained procedure and risk and patient wants surgery and today I infiltrated the left hallux 60 mg Xylocaine Marcaine mixture remove the border exposed matrix and applied phenol 3 applications 30 seconds followed by alcohol lavage and sterile dressing. Instructed on soaks and reappoint

## 2015-10-05 DIAGNOSIS — R05 Cough: Secondary | ICD-10-CM | POA: Diagnosis not present

## 2015-10-10 ENCOUNTER — Telehealth: Payer: Self-pay | Admitting: Cardiovascular Disease

## 2015-10-10 MED ORDER — CLOPIDOGREL BISULFATE 75 MG PO TABS: 75.0000 mg | ORAL_TABLET | Freq: Every day | ORAL | Status: DC

## 2015-10-10 NOTE — Telephone Encounter (Signed)
Refill sent to the pharmacy electronically.  

## 2015-10-10 NOTE — Telephone Encounter (Signed)
Unable to reach pt or leave a message  

## 2015-10-10 NOTE — Telephone Encounter (Signed)
New message        *STAT* If patient is at the pharmacy, call can be transferred to refill team.   1. Which medications need to be refilled? (please list name of each medication and dose if known)generic plavix 2. Which pharmacy/location (including street and city if local pharmacy) is medication to be sent to? [facility] on [address] 3. Do they need a 30 day or 90 day supply? 30 Pharmacy told pt this medication is on back order indefinately.  He has 8 or 9 pills.  What should he do?

## 2015-10-10 NOTE — Telephone Encounter (Signed)
°*  STAT* If patient is at the pharmacy, call can be transferred to refill team.   1. Which medications need to be refilled? (please list name of each medication and dose if known) Clopidogrel 75mg  ( needs a New prescription Sent )   2. Which pharmacy/location (including street and city if local pharmacy) is medication to be sent to?[facility] on [address] [phone]  3. Do they need a 30 day or 90 day supply? [place]

## 2015-11-17 DIAGNOSIS — H524 Presbyopia: Secondary | ICD-10-CM | POA: Diagnosis not present

## 2015-11-17 DIAGNOSIS — E119 Type 2 diabetes mellitus without complications: Secondary | ICD-10-CM | POA: Diagnosis not present

## 2015-11-17 DIAGNOSIS — H2513 Age-related nuclear cataract, bilateral: Secondary | ICD-10-CM | POA: Diagnosis not present

## 2015-11-25 DIAGNOSIS — M25572 Pain in left ankle and joints of left foot: Secondary | ICD-10-CM | POA: Diagnosis not present

## 2015-11-30 ENCOUNTER — Ambulatory Visit
Admission: RE | Admit: 2015-11-30 | Discharge: 2015-11-30 | Disposition: A | Discharge status: Home / Self Care | Payer: Medicare Other | Source: Ambulatory Visit | Attending: Family Medicine | Admitting: Family Medicine

## 2015-11-30 ENCOUNTER — Other Ambulatory Visit: Payer: Self-pay | Admitting: Family Medicine

## 2015-11-30 DIAGNOSIS — R0789 Other chest pain: Secondary | ICD-10-CM

## 2015-11-30 DIAGNOSIS — R0781 Pleurodynia: Secondary | ICD-10-CM | POA: Diagnosis not present

## 2016-01-03 DIAGNOSIS — Z23 Encounter for immunization: Secondary | ICD-10-CM | POA: Diagnosis not present

## 2016-01-03 DIAGNOSIS — Z125 Encounter for screening for malignant neoplasm of prostate: Secondary | ICD-10-CM | POA: Diagnosis not present

## 2016-01-03 DIAGNOSIS — I2510 Atherosclerotic heart disease of native coronary artery without angina pectoris: Secondary | ICD-10-CM | POA: Diagnosis not present

## 2016-01-03 DIAGNOSIS — G4733 Obstructive sleep apnea (adult) (pediatric): Secondary | ICD-10-CM | POA: Diagnosis not present

## 2016-01-03 DIAGNOSIS — E782 Mixed hyperlipidemia: Secondary | ICD-10-CM | POA: Diagnosis not present

## 2016-01-03 DIAGNOSIS — Z0000 Encounter for general adult medical examination without abnormal findings: Secondary | ICD-10-CM | POA: Diagnosis not present

## 2016-01-03 DIAGNOSIS — E139 Other specified diabetes mellitus without complications: Secondary | ICD-10-CM | POA: Diagnosis not present

## 2016-01-03 DIAGNOSIS — N529 Male erectile dysfunction, unspecified: Secondary | ICD-10-CM | POA: Diagnosis not present

## 2016-01-03 DIAGNOSIS — I10 Essential (primary) hypertension: Secondary | ICD-10-CM | POA: Diagnosis not present

## 2016-01-13 DIAGNOSIS — E1140 Type 2 diabetes mellitus with diabetic neuropathy, unspecified: Secondary | ICD-10-CM | POA: Diagnosis not present

## 2016-01-13 DIAGNOSIS — E1165 Type 2 diabetes mellitus with hyperglycemia: Secondary | ICD-10-CM | POA: Diagnosis not present

## 2016-03-07 ENCOUNTER — Encounter: Payer: Self-pay | Admitting: Cardiovascular Disease

## 2016-03-07 ENCOUNTER — Ambulatory Visit (INDEPENDENT_AMBULATORY_CARE_PROVIDER_SITE_OTHER): Payer: Medicare Other | Admitting: Cardiovascular Disease

## 2016-03-07 VITALS — BP 128/76 | HR 57 | Ht 70.0 in | Wt 212.6 lb

## 2016-03-07 DIAGNOSIS — I10 Essential (primary) hypertension: Secondary | ICD-10-CM

## 2016-03-07 DIAGNOSIS — E785 Hyperlipidemia, unspecified: Secondary | ICD-10-CM

## 2016-03-07 DIAGNOSIS — I701 Atherosclerosis of renal artery: Secondary | ICD-10-CM | POA: Diagnosis not present

## 2016-03-07 MED ORDER — RANITIDINE HCL 150 MG PO TABS: 150.0000 mg | ORAL_TABLET | Freq: Two times a day (BID) | ORAL | 3 refills | Status: DC

## 2016-03-07 MED ORDER — ROSUVASTATIN CALCIUM 20 MG PO TABS: 20.0000 mg | ORAL_TABLET | ORAL | 6 refills | Status: DC

## 2016-03-07 MED ORDER — NITROGLYCERIN 0.4 MG SL SUBL: 0.4000 mg | SUBLINGUAL_TABLET | SUBLINGUAL | 0 refills | Status: DC | PRN

## 2016-03-07 MED ORDER — RAMIPRIL 2.5 MG PO CAPS: 2.5000 mg | ORAL_CAPSULE | Freq: Every day | ORAL | 3 refills | Status: DC

## 2016-03-07 MED ORDER — CLOPIDOGREL BISULFATE 75 MG PO TABS: 75.0000 mg | ORAL_TABLET | Freq: Every day | ORAL | 3 refills | Status: DC

## 2016-03-07 NOTE — Addendum Note (Signed)
Addended by: Therisa Doyne on: 03/07/2016 09:29 AM   Modules accepted: Orders

## 2016-03-07 NOTE — Assessment & Plan Note (Signed)
History of coronary artery disease status post non-STEMI 07/02/07. He underwent PCI and drug-eluting stenting of his proximal LAD with a Taxus Liberte drug-eluting stent. He also had a 60% right renal artery stenosis documented angiographically. He is asymptomatic.

## 2016-03-07 NOTE — Assessment & Plan Note (Signed)
History of hypertension blood pressure measured 128/76. He is on ramipril. Continue current meds at current dosing

## 2016-03-07 NOTE — Assessment & Plan Note (Signed)
History of hyperlipidemia on Crestor 10 mg by mouth 3 times a week. Recent lipid profile performed by his PCP 01/03/16 revealed total cholesterol 165, LDL of 93 and HDL of 46. Of note his cholesterol increased from a year ago when it was 112 total and 47 LDL probably related to decreased frequency of dosing. He is currently not at goal. We'll increase his Crestor from 10-20 mg 3 times a week and reassess.

## 2016-03-07 NOTE — Progress Notes (Signed)
03/07/2016 Justin Carr   1945-11-07  [id number]  Primary Physician Shirline Frees, MD Primary Cardiologist: [name] MD Renae Gloss  HPI:  The patient is a delightful 70 year old mildly overweight married Caucasian male, father of 2 children (1 living), who I saw 03/17/15. 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 His problems include obstructive sleep apnea, on CPAP, hypertension, and hyperlipidemia. He denies chest pain or shortness of breath. He had a non-ST-segment-elevation myocardial infarction, July 02, 2007. He underwent PCI and stenting of his proximal LAD with a Taxus Liberte drug-eluting stent. He had also had a 60% right renal artery stenosis documented angiographically at that time, which we have been following by duplex ultrasound. Since I saw him back a year ago he denies chest pain or shortness of breath. Recent lipid profile performed by his PCP 01/03/16 revealed total cholesterol 165, LDL 93 and HDL of 46.  Current Outpatient Prescriptions  Medication Sig Dispense Refill  . aspirin 325 MG tablet Take 325 mg by mouth daily.    . clopidogrel (PLAVIX) 75 MG tablet Take 1 tablet (75 mg total) by mouth daily. 90 tablet 3  . Coenzyme Q10 (CO Q 10 PO) Take by mouth daily.    . metFORMIN (GLUCOPHAGE) 1000 MG tablet Take 1,000 mg by mouth 2 (two) times daily with a meal.    . Multiple Vitamin (MULTIVITAMIN WITH MINERALS) TABS tablet Take 1 tablet by mouth daily.    . Multiple Vitamins-Minerals (PRESERVISION AREDS PO) Take by mouth daily.    . nitroGLYCERIN (NITROSTAT) 0.4 MG SL tablet Place 1 tablet (0.4 mg total) under the tongue every 5 (five) minutes as needed for chest pain. 25 tablet 0  . ramipril (ALTACE) 2.5 MG capsule Take 1 capsule (2.5 mg total) by mouth daily. 90 capsule 3  . ranitidine (ZANTAC) 150 MG tablet Take 1 tablet (150 mg total) by mouth  2 (two) times daily. 180 tablet 3  . sildenafil (REVATIO) 20 MG tablet Take 20-100 mg by mouth as needed (for erectile disfunction).     . sitaGLIPtin (JANUVIA) 50 MG tablet Take 50 mg by mouth daily.    . rosuvastatin (CRESTOR) 20 MG tablet Take 1 tablet (20 mg total) by mouth 3 (three) times a week. 30 tablet 6   No current facility-administered medications for this visit.     Allergies  Allergen Reactions  . Peanuts [Peanut Oil] Anaphylaxis  . Lipitor [Atorvastatin] Other (See Comments)    Muscle cramps  . Rosuvastatin Other (See Comments)    Muscle cramps    Social History   Social History  . Marital status: Married    Spouse name: N/A  . Number of children: 2  . Years of education: College +   Occupational History  . Retired    Social History Main Topics  . Smoking status: Former Smoker    Quit date: 07/03/2006  . Smokeless tobacco: Not on file     Comment: Quit 2009  . Alcohol use No     Comment: 1-2 beers per week  . Drug use: No  . Sexual activity: Not on file   Other Topics Concern  . Not on file   Social History Narrative   1 cup coffee and 2 sodas per day.   Lives at home with his wife.   Right-handed.        Review  of Systems: General: negative for chills, fever, night sweats or weight changes.  Cardiovascular: negative for chest pain, dyspnea on exertion, edema, orthopnea, palpitations, paroxysmal nocturnal dyspnea or shortness of breath Dermatological: negative for rash Respiratory: negative for cough or wheezing Urologic: negative for hematuria Abdominal: negative for nausea, vomiting, diarrhea, bright red blood per rectum, melena, or hematemesis Neurologic: negative for visual changes, syncope, or dizziness All other systems reviewed and are otherwise negative except as noted above.    Blood pressure 128/76, pulse (!) 57, height 5\' 10"  (1.778 m), weight 212 lb 9.6 oz (96.4 kg).  General appearance: alert and no distress Neck: no adenopathy,  no carotid bruit, no JVD, supple, symmetrical, trachea midline and thyroid not enlarged, symmetric, no tenderness/mass/nodules Lungs: clear to auscultation bilaterally Heart: regular rate and rhythm, S1, S2 normal, no murmur, click, rub or gallop Extremities: extremities normal, atraumatic, no cyanosis or edema  EKG sinus bradycardia 57 without ST or T-wave changes. I personally reviewed this EKG  ASSESSMENT AND PLAN:   Coronary artery disease History of coronary artery disease status post non-STEMI 07/02/07. He underwent PCI and drug-eluting stenting of his proximal LAD with a Taxus Liberte drug-eluting stent. He also had a 60% right renal artery stenosis documented angiographically. He is asymptomatic.  Essential hypertension History of hypertension blood pressure measured 128/76. He is on ramipril. Continue current meds at current dosing  Hyperlipidemia History of hyperlipidemia on Crestor 10 mg by mouth 3 times a week. Recent lipid profile performed by his PCP 01/03/16 revealed total cholesterol 165, LDL of 93 and HDL of 46. Of note his cholesterol increased from a year ago when it was 112 total and 47 LDL probably related to decreased frequency of dosing. He is currently not at goal. We'll increase his Crestor from 10-20 mg 3 times a week and reassess.  Renal artery stenosis History of 60% right renal artery stenosis demonstrated angiographically of the time of his LAD stent which we have been following by duplex ultrasound. His last Doppler study performed 5[birth year]68 year old stable mild to moderate right renal artery stenosis with normal renal pole to pole dimensions. We will repeat a renal Doppler study.      [name] MD FACP,FACC,FAHA, [facility] 03/07/2016 9:21 AM

## 2016-03-07 NOTE — Assessment & Plan Note (Signed)
History of 60% right renal artery stenosis demonstrated angiographically of the time of his LAD stent which we have been following by duplex ultrasound. His last Doppler study performed [birth year]68 year old stable mild to moderate right renal artery stenosis with normal renal pole to pole dimensions. We will repeat a renal Doppler study.

## 2016-03-07 NOTE — Patient Instructions (Addendum)
Medication Instructions:  Increase Rosuvastatin to 20 mg--1 tablet 3 times a week.  Labwork:  Your physician recommends that you return for a FASTING lipid profile and hepatic function panel in 3 months.  Testing:  Your physician has requested that you have a renal artery duplex early next year. During this test, an ultrasound is used to evaluate blood flow to the kidneys. Allow one hour for this exam. Do not eat after midnight the day before and avoid carbonated beverages. Take your medications as you usually do.   Follow-Up: Your physician wants you to follow-up in: 1 year with Dr. Andria Rhein will receive a reminder letter in the mail two months in advance. If you don't receive a letter, please call our office to schedule the follow-up appointment.  If you need a refill on your cardiac medications before your next appointment, please call your pharmacy.

## 2016-03-13 ENCOUNTER — Other Ambulatory Visit: Payer: Self-pay | Admitting: Cardiovascular Disease

## 2016-03-13 MED ORDER — ROSUVASTATIN CALCIUM 20 MG PO TABS: 20.0000 mg | ORAL_TABLET | ORAL | 3 refills | Status: DC

## 2016-03-13 NOTE — Addendum Note (Signed)
Addended by: Therisa Doyne on: 03/13/2016 01:54 PM   Modules accepted: Orders

## 2016-04-09 ENCOUNTER — Ambulatory Visit (HOSPITAL_COMMUNITY)
Admission: RE | Admit: 2016-04-09 | Discharge: 2016-04-09 | Disposition: A | Discharge status: Home / Self Care | Payer: Medicare Other | Source: Ambulatory Visit | Attending: Cardiology | Admitting: Cardiology

## 2016-04-09 DIAGNOSIS — I701 Atherosclerosis of renal artery: Secondary | ICD-10-CM | POA: Diagnosis not present

## 2016-04-16 ENCOUNTER — Other Ambulatory Visit: Payer: Self-pay | Admitting: Cardiovascular Disease

## 2016-04-16 DIAGNOSIS — I701 Atherosclerosis of renal artery: Secondary | ICD-10-CM

## 2016-04-17 DIAGNOSIS — B9789 Other viral agents as the cause of diseases classified elsewhere: Secondary | ICD-10-CM | POA: Diagnosis not present

## 2016-04-17 DIAGNOSIS — J069 Acute upper respiratory infection, unspecified: Secondary | ICD-10-CM | POA: Diagnosis not present

## 2016-06-21 DIAGNOSIS — E785 Hyperlipidemia, unspecified: Secondary | ICD-10-CM | POA: Diagnosis not present

## 2016-06-21 LAB — HEPATIC FUNCTION PANEL
ALK PHOS: 45 U/L (ref 40–115)
ALT: 21 U/L (ref 9–46)
AST: 16 U/L (ref 10–35)
Albumin: 3.8 g/dL (ref 3.6–5.1)
BILIRUBIN INDIRECT: 0.4 mg/dL (ref 0.2–1.2)
Bilirubin, Direct: 0.1 mg/dL (ref <=0.2)
TOTAL PROTEIN: 6.1 g/dL (ref 6.1–8.1)
Total Bilirubin: 0.5 mg/dL (ref 0.2–1.2)

## 2016-06-21 LAB — LIPID PANEL
CHOLESTEROL: 113 mg/dL (ref <200)
HDL: 35 mg/dL — AB (ref >40)
LDL CALC: 48 mg/dL (ref <100)
TRIGLYCERIDES: 151 mg/dL — AB (ref <150)
Total CHOL/HDL Ratio: 3.2 Ratio (ref <5.0)
VLDL: 30 mg/dL (ref <30)

## 2016-07-03 ENCOUNTER — Encounter: Payer: Self-pay | Admitting: *Deleted

## 2016-07-17 DIAGNOSIS — S335XXA Sprain of ligaments of lumbar spine, initial encounter: Secondary | ICD-10-CM | POA: Diagnosis not present

## 2016-07-17 DIAGNOSIS — T1490XA Injury, unspecified, initial encounter: Secondary | ICD-10-CM | POA: Diagnosis not present

## 2016-07-19 ENCOUNTER — Ambulatory Visit (INDEPENDENT_AMBULATORY_CARE_PROVIDER_SITE_OTHER): Payer: Medicare Other | Admitting: Podiatry

## 2016-07-19 DIAGNOSIS — L600 Ingrowing nail: Secondary | ICD-10-CM

## 2016-07-19 DIAGNOSIS — I701 Atherosclerosis of renal artery: Secondary | ICD-10-CM

## 2016-07-19 DIAGNOSIS — G629 Polyneuropathy, unspecified: Secondary | ICD-10-CM

## 2016-07-19 MED ORDER — GABAPENTIN 300 MG PO CAPS: 300.0000 mg | ORAL_CAPSULE | Freq: Three times a day (TID) | ORAL | 3 refills | Status: DC

## 2016-07-19 NOTE — Progress Notes (Signed)
Subjective:    Patient ID: Justin Carr, male   DOB: 71 y.o.   MRN: [id number]   HPI patient wanted checked as he developed some pain around his left hallux medial side we had done previous ingrown toenail surgery. Also states the nails seems more discolored and loose    ROS      Objective:  Physical Exam Neurovascular status unchanged with moderate thickness to the left hallux nailbed with good correction of the medial corner with no redness or drainage noted    Assessment:     Patient has mycotic infection of the nail left with probable structural damage to the nail plate with mild discomfort noted    Plan:    Explained condition to patient and at this point we will just use cushioning and wider shoes and if it were to persist at one point we may need to remove the entire nail which I educated him on today

## 2016-07-26 DIAGNOSIS — L814 Other melanin hyperpigmentation: Secondary | ICD-10-CM | POA: Diagnosis not present

## 2016-07-26 DIAGNOSIS — D225 Melanocytic nevi of trunk: Secondary | ICD-10-CM | POA: Diagnosis not present

## 2016-07-26 DIAGNOSIS — D2262 Melanocytic nevi of left upper limb, including shoulder: Secondary | ICD-10-CM | POA: Diagnosis not present

## 2016-07-26 DIAGNOSIS — D2272 Melanocytic nevi of left lower limb, including hip: Secondary | ICD-10-CM | POA: Diagnosis not present

## 2016-07-26 DIAGNOSIS — L821 Other seborrheic keratosis: Secondary | ICD-10-CM | POA: Diagnosis not present

## 2016-07-26 DIAGNOSIS — D2271 Melanocytic nevi of right lower limb, including hip: Secondary | ICD-10-CM | POA: Diagnosis not present

## 2016-08-13 DIAGNOSIS — G4733 Obstructive sleep apnea (adult) (pediatric): Secondary | ICD-10-CM | POA: Diagnosis not present

## 2016-10-08 DIAGNOSIS — S39012A Strain of muscle, fascia and tendon of lower back, initial encounter: Secondary | ICD-10-CM | POA: Diagnosis not present

## 2016-11-21 DIAGNOSIS — H353130 Nonexudative age-related macular degeneration, bilateral, stage unspecified: Secondary | ICD-10-CM | POA: Diagnosis not present

## 2016-11-21 DIAGNOSIS — H5203 Hypermetropia, bilateral: Secondary | ICD-10-CM | POA: Diagnosis not present

## 2016-11-21 DIAGNOSIS — H2513 Age-related nuclear cataract, bilateral: Secondary | ICD-10-CM | POA: Diagnosis not present

## 2016-11-21 DIAGNOSIS — E119 Type 2 diabetes mellitus without complications: Secondary | ICD-10-CM | POA: Diagnosis not present

## 2016-11-22 DIAGNOSIS — J200 Acute bronchitis due to Mycoplasma pneumoniae: Secondary | ICD-10-CM | POA: Diagnosis not present

## 2016-11-27 DIAGNOSIS — R05 Cough: Secondary | ICD-10-CM | POA: Diagnosis not present

## 2016-11-27 DIAGNOSIS — J208 Acute bronchitis due to other specified organisms: Secondary | ICD-10-CM | POA: Diagnosis not present

## 2017-01-03 DIAGNOSIS — N529 Male erectile dysfunction, unspecified: Secondary | ICD-10-CM | POA: Diagnosis not present

## 2017-01-03 DIAGNOSIS — Z125 Encounter for screening for malignant neoplasm of prostate: Secondary | ICD-10-CM | POA: Diagnosis not present

## 2017-01-03 DIAGNOSIS — I10 Essential (primary) hypertension: Secondary | ICD-10-CM | POA: Diagnosis not present

## 2017-01-03 DIAGNOSIS — E782 Mixed hyperlipidemia: Secondary | ICD-10-CM | POA: Diagnosis not present

## 2017-01-03 DIAGNOSIS — G4733 Obstructive sleep apnea (adult) (pediatric): Secondary | ICD-10-CM | POA: Diagnosis not present

## 2017-01-03 DIAGNOSIS — Z0000 Encounter for general adult medical examination without abnormal findings: Secondary | ICD-10-CM | POA: Diagnosis not present

## 2017-01-03 DIAGNOSIS — E139 Other specified diabetes mellitus without complications: Secondary | ICD-10-CM | POA: Diagnosis not present

## 2017-01-03 DIAGNOSIS — Z23 Encounter for immunization: Secondary | ICD-10-CM | POA: Diagnosis not present

## 2017-01-03 DIAGNOSIS — I2510 Atherosclerotic heart disease of native coronary artery without angina pectoris: Secondary | ICD-10-CM | POA: Diagnosis not present

## 2017-01-14 DIAGNOSIS — I10 Essential (primary) hypertension: Secondary | ICD-10-CM | POA: Diagnosis not present

## 2017-01-14 DIAGNOSIS — E1165 Type 2 diabetes mellitus with hyperglycemia: Secondary | ICD-10-CM | POA: Diagnosis not present

## 2017-01-14 DIAGNOSIS — E1140 Type 2 diabetes mellitus with diabetic neuropathy, unspecified: Secondary | ICD-10-CM | POA: Diagnosis not present

## 2017-03-05 ENCOUNTER — Ambulatory Visit: Payer: Medicare Other | Admitting: Cardiovascular Disease

## 2017-03-27 ENCOUNTER — Ambulatory Visit (INDEPENDENT_AMBULATORY_CARE_PROVIDER_SITE_OTHER): Payer: Medicare Other | Admitting: Cardiovascular Disease

## 2017-03-27 ENCOUNTER — Encounter: Payer: Self-pay | Admitting: Cardiovascular Disease

## 2017-03-27 VITALS — BP 106/56 | HR 60 | Ht 70.0 in | Wt 213.0 lb

## 2017-03-27 DIAGNOSIS — R079 Chest pain, unspecified: Secondary | ICD-10-CM | POA: Diagnosis not present

## 2017-03-27 DIAGNOSIS — I701 Atherosclerosis of renal artery: Secondary | ICD-10-CM | POA: Diagnosis not present

## 2017-03-27 DIAGNOSIS — E7800 Pure hypercholesterolemia, unspecified: Secondary | ICD-10-CM | POA: Diagnosis not present

## 2017-03-27 DIAGNOSIS — I10 Essential (primary) hypertension: Secondary | ICD-10-CM

## 2017-03-27 MED ORDER — RANITIDINE HCL 150 MG PO TABS: 150.0000 mg | ORAL_TABLET | Freq: Two times a day (BID) | ORAL | 3 refills | Status: DC

## 2017-03-27 MED ORDER — CLOPIDOGREL BISULFATE 75 MG PO TABS: 75.0000 mg | ORAL_TABLET | Freq: Every day | ORAL | 3 refills | Status: DC

## 2017-03-27 MED ORDER — ROSUVASTATIN CALCIUM 20 MG PO TABS: 20.0000 mg | ORAL_TABLET | ORAL | 3 refills | Status: DC

## 2017-03-27 MED ORDER — NITROGLYCERIN 0.4 MG SL SUBL: 0.4000 mg | SUBLINGUAL_TABLET | SUBLINGUAL | 0 refills | Status: DC | PRN

## 2017-03-27 MED ORDER — RAMIPRIL 2.5 MG PO CAPS: 2.5000 mg | ORAL_CAPSULE | Freq: Every day | ORAL | 3 refills | Status: DC

## 2017-03-27 NOTE — Progress Notes (Signed)
   03/27/2017 Justin Carr   11/19/1945  [id number]  Primary Physician Harris, William, MD Primary Cardiologist: Jonathan J Berry MD FACP, FACC, FAHA, FSCAI  HPI:  Justin Carr is a 71 y.o. mildly overweight married Caucasian male, father of 2 children (1 living), who I saw  03/07/16. 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 His problems include obstructive sleep apnea, on CPAP, hypertension, and hyperlipidemia. He denies chest pain or shortness of breath. He had a non-ST-segment-elevation myocardial infarction, July 02, 2007. He underwent PCI and stenting of his proximal LAD with a Taxus Liberte drug-eluting stent. He had also had a 60% right renal artery stenosis documented angiographically at that time, which we have been following by duplex ultrasound. Since I saw him back a year ago he denies chest pain or shortness of breath but has noticed some left upper extremity discomfort with exertion similar to his pre-MI symptoms. . Recent lipid profile performed by his PCP 06/21/16 revealing a total cholesterol 113, LDL 48 and HDL of 35.    Current Meds  Medication Sig  . ALPHA-LIPOIC ACID PO Take 1 tablet by mouth daily.  . aspirin 325 MG tablet Take 325 mg by mouth daily.  . Blood Glucose Monitoring Suppl (ONETOUCH VERIO) w/Device KIT   . clopidogrel (PLAVIX) 75 MG tablet Take 1 tablet (75 mg total) by mouth daily.  . Coenzyme Q10 (CO Q 10 PO) Take by mouth daily.  . Cyanocobalamin (B-12) 2500 MCG TABS Take 1 tablet by mouth daily.  . cyclobenzaprine (FLEXERIL) 10 MG tablet   . gabapentin (NEURONTIN) 300 MG capsule Take 1 capsule (300 mg total) by mouth 3 (three) times daily. (Patient taking differently: Take 300 mg by mouth daily. )  . metFORMIN (GLUCOPHAGE) 1000 MG tablet Take 1,000 mg by mouth 2 (two) times daily with a meal.  . Multiple Vitamin (MULTIVITAMIN WITH MINERALS) TABS tablet  Take 1 tablet by mouth daily.  . Multiple Vitamins-Minerals (PRESERVISION AREDS PO) Take by mouth daily.  . naproxen (NAPROSYN) 500 MG tablet   . nitroGLYCERIN (NITROSTAT) 0.4 MG SL tablet Place 1 tablet (0.4 mg total) under the tongue every 5 (five) minutes as needed for chest pain.  . ONETOUCH VERIO test strip   . ramipril (ALTACE) 2.5 MG capsule Take 1 capsule (2.5 mg total) by mouth daily.  . ranitidine (ZANTAC) 150 MG tablet Take 1 tablet (150 mg total) by mouth 2 (two) times daily.  . sildenafil (REVATIO) 20 MG tablet Take 20-100 mg by mouth as needed (for erectile disfunction).   . sitaGLIPtin (JANUVIA) 50 MG tablet Take 50 mg by mouth daily.     Allergies  Allergen Reactions  . Peanuts [Peanut Oil] Anaphylaxis  . Lipitor [Atorvastatin] Other (See Comments)    Muscle cramps  . Rosuvastatin Other (See Comments)    Muscle cramps    Social History   Socioeconomic History  . Marital status: Married    Spouse name: Not on file  . Number of children: 2  . Years of education: College +  . Highest education level: Not on file  Social Needs  . Financial resource strain: Not on file  . Food insecurity - worry: Not on file  . Food insecurity - inability: Not on file  . Transportation needs - medical: Not on file  . Transportation needs - non-medical: Not on file  Occupational History  . Occupation: Retired    Tobacco Use  . Smoking status: Former Smoker    Last attempt to quit: 07/03/2006    Years since quitting: 10.7  . Smokeless tobacco: Never Used  . Tobacco comment: Quit 2009  Substance and Sexual Activity  . Alcohol use: No    Alcohol/week: 0.0 oz    Comment: 1-2 beers per week  . Drug use: No  . Sexual activity: Not on file  Other Topics Concern  . Not on file  Social History Narrative   1 cup coffee and 2 sodas per day.   Lives at home with his wife.   Right-handed.     Review of Systems: General: negative for chills, fever, night sweats or weight changes.    Cardiovascular: negative for chest pain, dyspnea on exertion, edema, orthopnea, palpitations, paroxysmal nocturnal dyspnea or shortness of breath Dermatological: negative for rash Respiratory: negative for cough or wheezing Urologic: negative for hematuria Abdominal: negative for nausea, vomiting, diarrhea, bright red blood per rectum, melena, or hematemesis Neurologic: negative for visual changes, syncope, or dizziness All other systems reviewed and are otherwise negative except as noted above.    Blood pressure (!) 106/56, pulse 60, height 5' 10" (1.778 m), weight 213 lb (96.6 kg).  General appearance: alert and no distress Neck: no adenopathy, no carotid bruit, no JVD, supple, symmetrical, trachea midline and thyroid not enlarged, symmetric, no tenderness/mass/nodules Lungs: clear to auscultation bilaterally Heart: regular rate and rhythm, S1, S2 normal, no murmur, click, rub or gallop Extremities: extremities normal, atraumatic, no cyanosis or edema Pulses: 2+ and symmetric Skin: Skin color, texture, turgor normal. No rashes or lesions Neurologic: Alert and oriented X 3, normal strength and tone. Normal symmetric reflexes. Normal coordination and gait  EKG sinus rhythm at 60 with nonspecific ST and T-wave changes. I personally reviewed his EKG  ASSESSMENT AND PLAN:   Coronary artery disease History of CAD status post non-ST segment elevation myocardial infarction 07/02/07. He underwent PCI and drug-eluting stenting of his proximal LAD with a Taxus Liberte drug-eluting stent. He has done well until recently when he's noticed some left upper/inner arm pain with exertion which is similar to his pre-heart attack symptoms. I'm going to obtain a exercise Myoview stress test to further evaluate.  Essential hypertension History of essential hypertension blood pressure measured at 106/56. He is on ramipril. Continue current meds at current dosing.  Hyperlipidemia History of hyperlipidemia  on statin therapy with recent lipid profile performed 06/21/16 revealing total cholesterol of 113, LDL 48 and HDL of 35.  Renal artery stenosis History of angiographically documented 60% right renal artery stenosis which we have been following by duplex ultrasound most recently checked 04/09/16 revealing no progression. I do not think this needs to be rechecked at this time.      Jonathan J. Berry MD FACP,FACC,FAHA, FSCAI 03/27/2017 10:58 AM 

## 2017-03-27 NOTE — Assessment & Plan Note (Signed)
History of essential hypertension blood pressure measured at 106/56. He is on ramipril. Continue current meds at current dosing.

## 2017-03-27 NOTE — Assessment & Plan Note (Signed)
History of CAD status post non-ST segment elevation myocardial infarction 07/02/07. He underwent PCI and drug-eluting stenting of his proximal LAD with a Taxus Liberte drug-eluting stent. He has done well until recently when he's noticed some left upper/inner arm pain with exertion which is similar to his pre-heart attack symptoms. I'm going to obtain a exercise Myoview stress test to further evaluate.

## 2017-03-27 NOTE — Addendum Note (Signed)
Addended by: Therisa Doyne on: 03/27/2017 11:02 AM   Modules accepted: Orders

## 2017-03-27 NOTE — Patient Instructions (Signed)
Medication Instructions: Your physician recommends that you continue on your current medications as directed. Please refer to the Current Medication list given to you today.   Testing/Procedures: Your physician has requested that you have an exercise stress myoview. For further information please visit [email or web address]. Please follow instruction sheet, as given.   Follow-Up: Your physician wants you to follow-up in: 6 months with Dr. [name]. You will receive a reminder letter in the mail two months in advance. If you don't receive a letter, please call our office to schedule the follow-up appointment.  If you need a refill on your cardiac medications before your next appointment, please call your pharmacy.

## 2017-03-27 NOTE — Assessment & Plan Note (Signed)
History of angiographically documented 60% right renal artery stenosis which we have been following by duplex ultrasound most recently checked 04/09/16 revealing no progression. I do not think this needs to be rechecked at this time.

## 2017-03-27 NOTE — Assessment & Plan Note (Signed)
History of hyperlipidemia on statin therapy with recent lipid profile performed 06/21/16 revealing total cholesterol of 113, LDL 48 and HDL of 35.

## 2017-04-02 ENCOUNTER — Telehealth (HOSPITAL_COMMUNITY): Payer: Self-pay | Admitting: *Deleted

## 2017-04-02 NOTE — Telephone Encounter (Signed)
Close encounter 

## 2017-04-04 ENCOUNTER — Ambulatory Visit (HOSPITAL_COMMUNITY)
Admission: RE | Admit: 2017-04-04 | Discharge: 2017-04-04 | Disposition: A | Discharge status: Home / Self Care | Payer: Medicare Other | Source: Ambulatory Visit | Attending: Cardiology | Admitting: Cardiology

## 2017-04-04 DIAGNOSIS — I252 Old myocardial infarction: Secondary | ICD-10-CM | POA: Insufficient documentation

## 2017-04-04 DIAGNOSIS — Z87891 Personal history of nicotine dependence: Secondary | ICD-10-CM | POA: Diagnosis not present

## 2017-04-04 DIAGNOSIS — E119 Type 2 diabetes mellitus without complications: Secondary | ICD-10-CM | POA: Insufficient documentation

## 2017-04-04 DIAGNOSIS — I2510 Atherosclerotic heart disease of native coronary artery without angina pectoris: Secondary | ICD-10-CM | POA: Insufficient documentation

## 2017-04-04 DIAGNOSIS — M79622 Pain in left upper arm: Secondary | ICD-10-CM | POA: Diagnosis not present

## 2017-04-04 DIAGNOSIS — I10 Essential (primary) hypertension: Secondary | ICD-10-CM | POA: Insufficient documentation

## 2017-04-04 DIAGNOSIS — R079 Chest pain, unspecified: Secondary | ICD-10-CM | POA: Insufficient documentation

## 2017-04-04 DIAGNOSIS — R002 Palpitations: Secondary | ICD-10-CM | POA: Diagnosis not present

## 2017-04-04 DIAGNOSIS — G4733 Obstructive sleep apnea (adult) (pediatric): Secondary | ICD-10-CM | POA: Insufficient documentation

## 2017-04-04 DIAGNOSIS — Z8249 Family history of ischemic heart disease and other diseases of the circulatory system: Secondary | ICD-10-CM | POA: Diagnosis not present

## 2017-04-04 DIAGNOSIS — I701 Atherosclerosis of renal artery: Secondary | ICD-10-CM | POA: Diagnosis not present

## 2017-04-04 DIAGNOSIS — R9439 Abnormal result of other cardiovascular function study: Secondary | ICD-10-CM | POA: Insufficient documentation

## 2017-04-04 LAB — MYOCARDIAL PERFUSION IMAGING
CHL CUP NUCLEAR SDS: 14
CHL CUP NUCLEAR SRS: 7
CHL CUP NUCLEAR SSS: 21
CHL CUP RESTING HR STRESS: 63 {beats}/min
CSEPEDS: 0 s
CSEPHR: 87 %
CSEPPHR: 130 {beats}/min
Estimated workload: 7 METS
Exercise duration (min): 7 min
LV dias vol: 121 mL (ref 62–150)
LV sys vol: 62 mL
MPHR: 149 {beats}/min
RPE: 18
TID: 1.32

## 2017-04-04 MED ORDER — TECHNETIUM TC 99M TETROFOSMIN IV KIT
30.2000 | PACK | Freq: Once | INTRAVENOUS | Status: AC | PRN
  Administered 2017-04-04: 30.2 via INTRAVENOUS
  Filled 2017-04-04: qty 31

## 2017-04-04 MED ORDER — TECHNETIUM TC 99M TETROFOSMIN IV KIT
10.6000 | PACK | Freq: Once | INTRAVENOUS | Status: AC | PRN
  Administered 2017-04-04: 10.6 via INTRAVENOUS
  Filled 2017-04-04: qty 11

## 2017-04-05 ENCOUNTER — Encounter: Payer: Self-pay | Admitting: Cardiovascular Disease

## 2017-04-05 ENCOUNTER — Ambulatory Visit (INDEPENDENT_AMBULATORY_CARE_PROVIDER_SITE_OTHER): Payer: Medicare Other | Admitting: Cardiovascular Disease

## 2017-04-05 ENCOUNTER — Ambulatory Visit
Admission: RE | Admit: 2017-04-05 | Discharge: 2017-04-05 | Disposition: A | Discharge status: Home / Self Care | Payer: Medicare Other | Source: Ambulatory Visit | Attending: Cardiovascular Disease | Admitting: Cardiovascular Disease

## 2017-04-05 VITALS — BP 154/76 | HR 75 | Ht 71.0 in | Wt 211.4 lb

## 2017-04-05 DIAGNOSIS — I701 Atherosclerosis of renal artery: Secondary | ICD-10-CM | POA: Diagnosis not present

## 2017-04-05 DIAGNOSIS — R9439 Abnormal result of other cardiovascular function study: Secondary | ICD-10-CM | POA: Diagnosis not present

## 2017-04-05 DIAGNOSIS — R072 Precordial pain: Secondary | ICD-10-CM

## 2017-04-05 DIAGNOSIS — R079 Chest pain, unspecified: Secondary | ICD-10-CM | POA: Diagnosis not present

## 2017-04-05 MED ORDER — ISOSORBIDE MONONITRATE ER 30 MG PO TB24: 15.0000 mg | ORAL_TABLET | Freq: Every day | ORAL | 6 refills | Status: DC

## 2017-04-05 NOTE — H&P (View-Only) (Signed)
Justin Carr returns here for follow-up of his Myoview stress test from yesterday which is high risk chemistry ischemia in the LAD territory. The plan of performing outpatient diagnostic cardiac catheterization via the right radial approach next Thursday.  [name], M.D., [place], [facility], [place] [address] [place],   [address]  [phone] 04/05/2017 3:19 PM

## 2017-04-05 NOTE — Progress Notes (Signed)
Mr. Cheatum returns here for follow-up of his Myoview stress test from yesterday which is high risk chemistry ischemia in the LAD territory. The plan of performing outpatient diagnostic cardiac catheterization via the right radial approach next Thursday.  [name], M.D., [place], [facility], [place] [address] [place], [place]  [address]  [phone] 04/05/2017 3:19 PM

## 2017-04-05 NOTE — Patient Instructions (Addendum)
   [facility] [address] [place] [address] Dept: [phone] Loc: [phone]  Syaire Saber  04/05/2017  You are scheduled for a Cardiac Catheterization on Thursday, January 17 with Dr. Quay Burow.  1. Please arrive at the [facility] (Main Entrance A) at [facility]: [address] [place], [place] [address] at 10:30 AM (two hours before your procedure to ensure your preparation). Free valet parking service is available.   Special note: Every effort is made to have your procedure done on time. Please understand that emergencies sometimes delay scheduled procedures.  2. Diet: Do not eat or drink anything after midnight prior to your procedure except sips of water to take medications.  3. Labs: Please have labs done in our office today.  4. Medication instructions in preparation for your procedure:  Stop taking, Glucophage (Metformin) on Wednesday, January 16.    Do not take any diabetic medications (Januvia) on the morning of your procedure.  On the morning of your procedure, take your aspirin and Plavix/Clopidogrel and any morning medicines NOT listed above.  You may use sips of water.  5. Plan for one night stay--bring personal belongings. 6. Bring a current list of your medications and current insurance cards. 7. You MUST have a responsible person to drive you home. 8. Someone MUST be with you the first 24 hours after you arrive home or your discharge will be delayed. 9. Please wear clothes that are easy to get on and off and wear slip-on shoes.  Thank you for allowing [place] to care for you!   --  Invasive Cardiovascular services  Post-procedure follow-up:  Your physician recommends that you schedule a follow-up appointment in: 1-2 weeks after cath with Dr. [name].   MEDICATION CHANGE:  START Isosorbide 30 mg---take 1/2 tab (15 mg) daily.

## 2017-04-05 NOTE — Assessment & Plan Note (Signed)
Justin Carr returns today for follow-up of his Myoview stress test which was performed yesterday revealing ischemia in the LAD territory. He's had recurrent symptoms of left arm discomfort similar to his pre-MI symptoms with a history of proximal LAD stenting back in 2009. We'll plan on proceeding with left heart cath via right radial approach next Thursday.

## 2017-04-06 LAB — PROTIME-INR
INR: 1 (ref 0.8–1.2)
Prothrombin Time: 10.1 s (ref 9.1–12.0)

## 2017-04-06 LAB — BASIC METABOLIC PANEL
BUN / CREAT RATIO: 17 (ref 10–24)
BUN: 14 mg/dL (ref 8–27)
CHLORIDE: 102 mmol/L (ref 96–106)
CO2: 23 mmol/L (ref 20–29)
Calcium: 9.8 mg/dL (ref 8.6–10.2)
Creatinine, Ser: 0.82 mg/dL (ref 0.76–1.27)
GFR calc non Af Amer: 89 mL/min/{1.73_m2} (ref >59)
GFR, EST AFRICAN AMERICAN: 103 mL/min/{1.73_m2} (ref >59)
GLUCOSE: 145 mg/dL — AB (ref 65–99)
Potassium: 4.9 mmol/L (ref 3.5–5.2)
SODIUM: 142 mmol/L (ref 134–144)

## 2017-04-06 LAB — CBC WITH DIFFERENTIAL/PLATELET
BASOS ABS: 0 10*3/uL (ref 0.0–0.2)
Basos: 1 %
EOS (ABSOLUTE): 0.3 10*3/uL (ref 0.0–0.4)
EOS: 4 %
HEMATOCRIT: 44.9 % (ref 37.5–51.0)
HEMOGLOBIN: 15.4 g/dL (ref 13.0–17.7)
Immature Grans (Abs): 0 10*3/uL (ref 0.0–0.1)
Immature Granulocytes: 0 %
LYMPHS ABS: 2.9 10*3/uL (ref 0.7–3.1)
Lymphs: 38 %
MCH: 32.4 pg (ref 26.6–33.0)
MCHC: 34.3 g/dL (ref 31.5–35.7)
MCV: 94 fL (ref 79–97)
MONOCYTES: 9 %
MONOS ABS: 0.7 10*3/uL (ref 0.1–0.9)
NEUTROS ABS: 3.8 10*3/uL (ref 1.4–7.0)
Neutrophils: 48 %
Platelets: 204 10*3/uL (ref 150–379)
RBC: 4.76 x10E6/uL (ref 4.14–5.80)
RDW: 13.9 % (ref 12.3–15.4)
WBC: 7.7 10*3/uL (ref 3.4–10.8)

## 2017-04-06 LAB — APTT: aPTT: 30 s (ref 24–33)

## 2017-04-06 LAB — TSH: TSH: 1.96 u[IU]/mL (ref 0.450–4.500)

## 2017-04-09 ENCOUNTER — Telehealth: Payer: Self-pay | Admitting: Cardiovascular Disease

## 2017-04-09 NOTE — Telephone Encounter (Signed)
Spoke to pt. Informed him of time change for procedure on 04/11/17 with Dr. [name]. Arrival time changed to 11:30A for 1:30P procedure.  Pt verbalized understanding and thanks for the call.

## 2017-04-10 ENCOUNTER — Telehealth: Payer: Self-pay

## 2017-04-10 ENCOUNTER — Other Ambulatory Visit: Payer: Self-pay | Admitting: Cardiovascular Disease

## 2017-04-10 ENCOUNTER — Ambulatory Visit: Payer: Medicare Other | Admitting: Adult Health

## 2017-04-10 ENCOUNTER — Telehealth: Payer: Self-pay | Admitting: Cardiovascular Disease

## 2017-04-10 NOTE — Telephone Encounter (Signed)
Returned call to patient. He took metformin today at 0730. He was supposed to STOP taking it today for his cath tomorrow 04/11/17.   Will inform MD.

## 2017-04-10 NOTE — Telephone Encounter (Signed)
Patient contacted pre-catheterization at [facility] scheduled for:  04/11/2017 @ 1330 Verified arrival time and place:  NT @ 1130 Confirmed AM meds to be taken pre-cath with sip of water: Hold metformin-starting 04/10/2017 Take ASA/Plavix Hold Januvia am of Confirmed patient has responsible person to drive home post procedure and observe patient for 24 hours:  yes Addl concerns:  none

## 2017-04-10 NOTE — Telephone Encounter (Signed)
New message  Pt verbalized that he is calling for RN  He has some questions about his procedure

## 2017-04-10 NOTE — Telephone Encounter (Signed)
That's fine. None from now on

## 2017-04-11 ENCOUNTER — Other Ambulatory Visit: Payer: Self-pay

## 2017-04-11 ENCOUNTER — Ambulatory Visit (HOSPITAL_COMMUNITY)
Admission: RE | Disposition: A | Discharge status: Home / Self Care | Payer: Self-pay | Source: Ambulatory Visit | Attending: Cardiovascular Disease

## 2017-04-11 ENCOUNTER — Encounter (HOSPITAL_COMMUNITY): Payer: Self-pay | Admitting: General Practice

## 2017-04-11 ENCOUNTER — Ambulatory Visit (HOSPITAL_COMMUNITY)
Admission: RE | Admit: 2017-04-11 | Discharge: 2017-04-12 | Disposition: A | Discharge status: Home / Self Care | Payer: Medicare Other | Source: Ambulatory Visit | Attending: Cardiovascular Disease | Admitting: Cardiovascular Disease

## 2017-04-11 DIAGNOSIS — Z79899 Other long term (current) drug therapy: Secondary | ICD-10-CM | POA: Insufficient documentation

## 2017-04-11 DIAGNOSIS — I252 Old myocardial infarction: Secondary | ICD-10-CM | POA: Diagnosis not present

## 2017-04-11 DIAGNOSIS — I25118 Atherosclerotic heart disease of native coronary artery with other forms of angina pectoris: Secondary | ICD-10-CM | POA: Diagnosis not present

## 2017-04-11 DIAGNOSIS — Z888 Allergy status to other drugs, medicaments and biological substances status: Secondary | ICD-10-CM | POA: Diagnosis not present

## 2017-04-11 DIAGNOSIS — I2510 Atherosclerotic heart disease of native coronary artery without angina pectoris: Secondary | ICD-10-CM | POA: Diagnosis present

## 2017-04-11 DIAGNOSIS — E785 Hyperlipidemia, unspecified: Secondary | ICD-10-CM | POA: Diagnosis not present

## 2017-04-11 DIAGNOSIS — G4733 Obstructive sleep apnea (adult) (pediatric): Secondary | ICD-10-CM | POA: Insufficient documentation

## 2017-04-11 DIAGNOSIS — I208 Other forms of angina pectoris: Secondary | ICD-10-CM | POA: Diagnosis not present

## 2017-04-11 DIAGNOSIS — R9439 Abnormal result of other cardiovascular function study: Secondary | ICD-10-CM

## 2017-04-11 DIAGNOSIS — Z91010 Allergy to peanuts: Secondary | ICD-10-CM | POA: Diagnosis not present

## 2017-04-11 DIAGNOSIS — I701 Atherosclerosis of renal artery: Secondary | ICD-10-CM | POA: Insufficient documentation

## 2017-04-11 HISTORY — DX: Pneumonia, unspecified organism: J18.9

## 2017-04-11 HISTORY — DX: Dependence on other enabling machines and devices: Z99.89

## 2017-04-11 HISTORY — DX: Obstructive sleep apnea (adult) (pediatric): G47.33

## 2017-04-11 HISTORY — DX: Non-ST elevation (NSTEMI) myocardial infarction: I21.4

## 2017-04-11 HISTORY — PX: CORONARY ANGIOPLASTY WITH STENT PLACEMENT: SHX49

## 2017-04-11 HISTORY — PX: LEFT HEART CATH AND CORONARY ANGIOGRAPHY: CATH118249

## 2017-04-11 HISTORY — DX: Type 2 diabetes mellitus without complications: E11.9

## 2017-04-11 LAB — POCT ACTIVATED CLOTTING TIME: Activated Clotting Time: 577 seconds

## 2017-04-11 LAB — GLUCOSE, CAPILLARY
GLUCOSE-CAPILLARY: 143 mg/dL — AB (ref 65–99)
GLUCOSE-CAPILLARY: 118 mg/dL — AB (ref 65–99)

## 2017-04-11 SURGERY — LEFT HEART CATH AND CORONARY ANGIOGRAPHY
Anesthesia: LOCAL

## 2017-04-11 MED ORDER — FAMOTIDINE 20 MG PO TABS
20.0000 mg | ORAL_TABLET | Freq: Two times a day (BID) | ORAL | Status: DC
  Administered 2017-04-11 – 2017-04-12 (×2): 20 mg via ORAL
  Filled 2017-04-11 (×2): qty 1

## 2017-04-11 MED ORDER — CLOPIDOGREL BISULFATE 300 MG PO TABS
ORAL_TABLET | ORAL | Status: AC
  Filled 2017-04-11: qty 1

## 2017-04-11 MED ORDER — SODIUM CHLORIDE 0.9% FLUSH: 3.0000 mL | Freq: Two times a day (BID) | INTRAVENOUS | Status: DC

## 2017-04-11 MED ORDER — RAMIPRIL 2.5 MG PO CAPS
2.5000 mg | ORAL_CAPSULE | Freq: Every day | ORAL | Status: DC
  Administered 2017-04-12: 2.5 mg via ORAL
  Filled 2017-04-11 (×2): qty 1

## 2017-04-11 MED ORDER — ACETAMINOPHEN 325 MG PO TABS: 650.0000 mg | ORAL_TABLET | ORAL | Status: DC | PRN

## 2017-04-11 MED ORDER — BIVALIRUDIN TRIFLUOROACETATE 250 MG IV SOLR
INTRAVENOUS | Status: AC
  Filled 2017-04-11: qty 250

## 2017-04-11 MED ORDER — HEPARIN SODIUM (PORCINE) 1000 UNIT/ML IJ SOLN
INTRAMUSCULAR | Status: DC | PRN
  Administered 2017-04-11: 5000 [IU] via INTRAVENOUS

## 2017-04-11 MED ORDER — IOPAMIDOL (ISOVUE-370) INJECTION 76%
INTRAVENOUS | Status: AC
  Filled 2017-04-11: qty 100

## 2017-04-11 MED ORDER — SODIUM CHLORIDE 0.9 % IV SOLN
INTRAVENOUS | Status: AC
  Administered 2017-04-11: 18:00:00 via INTRAVENOUS

## 2017-04-11 MED ORDER — SODIUM CHLORIDE 0.9% FLUSH: 3.0000 mL | INTRAVENOUS | Status: DC | PRN

## 2017-04-11 MED ORDER — ROSUVASTATIN CALCIUM 20 MG PO TABS: 20.0000 mg | ORAL_TABLET | ORAL | Status: DC

## 2017-04-11 MED ORDER — MIDAZOLAM HCL 2 MG/2ML IJ SOLN
INTRAMUSCULAR | Status: AC
  Filled 2017-04-11: qty 2

## 2017-04-11 MED ORDER — BIVALIRUDIN BOLUS VIA INFUSION - CUPID
INTRAVENOUS | Status: DC | PRN
  Administered 2017-04-11: 71.775 mg via INTRAVENOUS

## 2017-04-11 MED ORDER — MORPHINE SULFATE (PF) 4 MG/ML IV SOLN
2.0000 mg | INTRAVENOUS | Status: DC | PRN
Start: 2017-04-11 — End: 2017-04-12

## 2017-04-11 MED ORDER — VERAPAMIL HCL 2.5 MG/ML IV SOLN
INTRAVENOUS | Status: DC | PRN
  Administered 2017-04-11 (×2): 5 mL via INTRA_ARTERIAL

## 2017-04-11 MED ORDER — LIDOCAINE HCL (PF) 1 % IJ SOLN
INTRAMUSCULAR | Status: AC
  Filled 2017-04-11: qty 30

## 2017-04-11 MED ORDER — IOPAMIDOL (ISOVUE-370) INJECTION 76%
INTRAVENOUS | Status: AC
  Filled 2017-04-11: qty 50

## 2017-04-11 MED ORDER — IOPAMIDOL (ISOVUE-370) INJECTION 76%
INTRAVENOUS | Status: DC | PRN
  Administered 2017-04-11: 105 mL via INTRA_ARTERIAL

## 2017-04-11 MED ORDER — CLOPIDOGREL BISULFATE 75 MG PO TABS: 75.0000 mg | ORAL_TABLET | Freq: Every day | ORAL | Status: DC

## 2017-04-11 MED ORDER — FENTANYL CITRATE (PF) 100 MCG/2ML IJ SOLN
INTRAMUSCULAR | Status: AC
  Filled 2017-04-11: qty 2

## 2017-04-11 MED ORDER — SODIUM CHLORIDE 0.9 % IV SOLN
INTRAVENOUS | Status: DC | PRN
  Administered 2017-04-11: 1.75 mg/kg/h via INTRAVENOUS

## 2017-04-11 MED ORDER — HEPARIN (PORCINE) IN NACL 2-0.9 UNIT/ML-% IJ SOLN
INTRAMUSCULAR | Status: AC | PRN
  Administered 2017-04-11: 1000 mL

## 2017-04-11 MED ORDER — ANGIOPLASTY BOOK
Freq: Once | Status: AC
  Administered 2017-04-11: 21:00:00 1
  Filled 2017-04-11: qty 1

## 2017-04-11 MED ORDER — NITROGLYCERIN 0.4 MG SL SUBL: 0.4000 mg | SUBLINGUAL_TABLET | SUBLINGUAL | Status: DC | PRN

## 2017-04-11 MED ORDER — ASPIRIN 81 MG PO CHEW: 81.0000 mg | CHEWABLE_TABLET | ORAL | Status: DC

## 2017-04-11 MED ORDER — SODIUM CHLORIDE 0.9 % IV SOLN: 250.0000 mL | INTRAVENOUS | Status: DC | PRN

## 2017-04-11 MED ORDER — ONDANSETRON HCL 4 MG/2ML IJ SOLN: 4.0000 mg | Freq: Four times a day (QID) | INTRAMUSCULAR | Status: DC | PRN

## 2017-04-11 MED ORDER — ASPIRIN 81 MG PO CHEW
81.0000 mg | CHEWABLE_TABLET | Freq: Every day | ORAL | Status: DC
  Administered 2017-04-12: 10:00:00 81 mg via ORAL
  Filled 2017-04-11: qty 1

## 2017-04-11 MED ORDER — CLOPIDOGREL BISULFATE 300 MG PO TABS
ORAL_TABLET | ORAL | Status: DC | PRN
  Administered 2017-04-11: 300 mg via ORAL

## 2017-04-11 MED ORDER — LABETALOL HCL 5 MG/ML IV SOLN: 10.0000 mg | INTRAVENOUS | Status: AC | PRN

## 2017-04-11 MED ORDER — FENTANYL CITRATE (PF) 100 MCG/2ML IJ SOLN
INTRAMUSCULAR | Status: DC | PRN
  Administered 2017-04-11: 25 ug via INTRAVENOUS

## 2017-04-11 MED ORDER — LIDOCAINE HCL (PF) 1 % IJ SOLN
INTRAMUSCULAR | Status: DC | PRN
  Administered 2017-04-11: 2 mL via INTRADERMAL

## 2017-04-11 MED ORDER — SODIUM CHLORIDE 0.9 % WEIGHT BASED INFUSION
3.0000 mL/kg/h | INTRAVENOUS | Status: DC
  Administered 2017-04-11: 3 mL/kg/h via INTRAVENOUS

## 2017-04-11 MED ORDER — SODIUM CHLORIDE 0.9 % WEIGHT BASED INFUSION: 1.0000 mL/kg/h | INTRAVENOUS | Status: DC

## 2017-04-11 MED ORDER — CLOPIDOGREL BISULFATE 75 MG PO TABS: 75.0000 mg | ORAL_TABLET | ORAL | Status: DC

## 2017-04-11 MED ORDER — ISOSORBIDE MONONITRATE ER 30 MG PO TB24
15.0000 mg | ORAL_TABLET | Freq: Every day | ORAL | Status: DC
  Administered 2017-04-11 – 2017-04-12 (×2): 15 mg via ORAL
  Filled 2017-04-11 (×2): qty 1

## 2017-04-11 MED ORDER — MIDAZOLAM HCL 2 MG/2ML IJ SOLN
INTRAMUSCULAR | Status: DC | PRN
  Administered 2017-04-11: 1 mg via INTRAVENOUS

## 2017-04-11 MED ORDER — VERAPAMIL HCL 2.5 MG/ML IV SOLN
INTRAVENOUS | Status: AC
  Filled 2017-04-11: qty 2

## 2017-04-11 MED ORDER — GABAPENTIN 300 MG PO CAPS
300.0000 mg | ORAL_CAPSULE | Freq: Three times a day (TID) | ORAL | Status: DC
  Administered 2017-04-11: 18:00:00 300 mg via ORAL
  Filled 2017-04-11 (×3): qty 1

## 2017-04-11 MED ORDER — NITROGLYCERIN 1 MG/10 ML FOR IR/CATH LAB
INTRA_ARTERIAL | Status: AC
  Filled 2017-04-11: qty 10

## 2017-04-11 MED ORDER — HEPARIN (PORCINE) IN NACL 2-0.9 UNIT/ML-% IJ SOLN
INTRAMUSCULAR | Status: AC
  Filled 2017-04-11: qty 1000

## 2017-04-11 MED ORDER — HYDRALAZINE HCL 20 MG/ML IJ SOLN: 5.0000 mg | INTRAMUSCULAR | Status: AC | PRN

## 2017-04-11 MED ORDER — VERAPAMIL HCL 2.5 MG/ML IV SOLN: INTRA_ARTERIAL | Status: DC | PRN

## 2017-04-11 MED ORDER — HEPARIN SODIUM (PORCINE) 1000 UNIT/ML IJ SOLN
INTRAMUSCULAR | Status: AC
  Filled 2017-04-11: qty 1

## 2017-04-11 MED ORDER — CLOPIDOGREL BISULFATE 75 MG PO TABS
75.0000 mg | ORAL_TABLET | Freq: Every day | ORAL | Status: DC
  Administered 2017-04-12: 10:00:00 75 mg via ORAL
  Filled 2017-04-11: qty 1

## 2017-04-11 SURGICAL SUPPLY — 19 items
BALLN SAPPHIRE 2.0X12 (BALLOONS) ×2
BALLOON SAPPHIRE 2.0X12 (BALLOONS) IMPLANT
CATH INFINITI 5FR ANG PIGTAIL (CATHETERS) ×1 IMPLANT
CATH OPTITORQUE TIG 4.0 6F (CATHETERS) ×1 IMPLANT
CATH VISTA GUIDE 6FR XBLAD3.5 (CATHETERS) ×1 IMPLANT
DEVICE RAD COMP TR BAND LRG (VASCULAR PRODUCTS) ×1 IMPLANT
GLIDESHEATH SLEND A-KIT 6F 22G (SHEATH) ×1 IMPLANT
GLIDESHEATH SLEND SS 6F .021 (SHEATH) ×1 IMPLANT
GUIDEWIRE INQWIRE 1.5J.035X260 (WIRE) IMPLANT
INQWIRE 1.5J .035X260CM (WIRE) ×2
KIT ENCORE 26 ADVANTAGE (KITS) ×1 IMPLANT
KIT HEART LEFT (KITS) ×2 IMPLANT
PACK CARDIAC CATHETERIZATION (CUSTOM PROCEDURE TRAY) ×2 IMPLANT
STENT SYNERGY DES 3X20 (Permanent Stent) ×1 IMPLANT
SYR MEDRAD MARK V 150ML (SYRINGE) ×2 IMPLANT
TRANSDUCER W/STOPCOCK (MISCELLANEOUS) ×2 IMPLANT
TUBING CIL FLEX 10 FLL-RA (TUBING) ×2 IMPLANT
WIRE ASAHI PROWATER 180CM (WIRE) ×1 IMPLANT
WIRE HI TORQ VERSACORE-J 145CM (WIRE) ×1 IMPLANT

## 2017-04-11 NOTE — Interval H&P Note (Signed)
Cath Lab Visit (complete for each Cath Lab visit)  Clinical Evaluation Leading to the Procedure:   ACS: No.  Non-ACS:    Anginal Classification: CCS II  Anti-ischemic medical therapy: No Therapy  Non-Invasive Test Results: High-risk stress test findings: cardiac mortality >3%/year  Prior CABG: No previous CABG      History and Physical Interval Note:  04/11/2017 3:28 PM  Justin Carr  has presented today for surgery, with the diagnosis of abn stress  The various methods of treatment have been discussed with the patient and family. After consideration of risks, benefits and other options for treatment, the patient has consented to  Procedure(s): LEFT HEART CATH AND CORONARY ANGIOGRAPHY (N/A) as a surgical intervention .  The patient's history has been reviewed, patient examined, no change in status, stable for surgery.  I have reviewed the patient's chart and labs.  Questions were answered to the patient's satisfaction.     Quay Burow

## 2017-04-11 NOTE — Progress Notes (Signed)
TR BAND REMOVAL  LOCATION:    right radial  DEFLATED PER PROTOCOL:    Yes.    TIME BAND OFF / DRESSING APPLIED:    20:15   SITE UPON ARRIVAL:    Level 0  SITE AFTER BAND REMOVAL:    Level 0  CIRCULATION SENSATION AND MOVEMENT:    Within Normal Limits   Yes.    COMMENTS:   Post TR band instructions given. Pt tolerated well. 

## 2017-04-12 ENCOUNTER — Encounter (HOSPITAL_COMMUNITY): Payer: Self-pay | Admitting: Cardiovascular Disease

## 2017-04-12 ENCOUNTER — Ambulatory Visit: Payer: Medicare Other | Admitting: Cardiovascular Disease

## 2017-04-12 DIAGNOSIS — I208 Other forms of angina pectoris: Secondary | ICD-10-CM | POA: Diagnosis not present

## 2017-04-12 DIAGNOSIS — R9439 Abnormal result of other cardiovascular function study: Secondary | ICD-10-CM | POA: Diagnosis not present

## 2017-04-12 DIAGNOSIS — Z91010 Allergy to peanuts: Secondary | ICD-10-CM | POA: Diagnosis not present

## 2017-04-12 DIAGNOSIS — I701 Atherosclerosis of renal artery: Secondary | ICD-10-CM | POA: Diagnosis not present

## 2017-04-12 DIAGNOSIS — G4733 Obstructive sleep apnea (adult) (pediatric): Secondary | ICD-10-CM | POA: Diagnosis not present

## 2017-04-12 DIAGNOSIS — I252 Old myocardial infarction: Secondary | ICD-10-CM | POA: Diagnosis not present

## 2017-04-12 DIAGNOSIS — Z955 Presence of coronary angioplasty implant and graft: Secondary | ICD-10-CM

## 2017-04-12 DIAGNOSIS — I25118 Atherosclerotic heart disease of native coronary artery with other forms of angina pectoris: Secondary | ICD-10-CM | POA: Diagnosis not present

## 2017-04-12 DIAGNOSIS — I25119 Atherosclerotic heart disease of native coronary artery with unspecified angina pectoris: Secondary | ICD-10-CM | POA: Diagnosis not present

## 2017-04-12 DIAGNOSIS — E785 Hyperlipidemia, unspecified: Secondary | ICD-10-CM | POA: Diagnosis not present

## 2017-04-12 DIAGNOSIS — Z888 Allergy status to other drugs, medicaments and biological substances status: Secondary | ICD-10-CM | POA: Diagnosis not present

## 2017-04-12 DIAGNOSIS — Z79899 Other long term (current) drug therapy: Secondary | ICD-10-CM | POA: Diagnosis not present

## 2017-04-12 LAB — CBC
HEMATOCRIT: 40.4 % (ref 39.0–52.0)
Hemoglobin: 13.9 g/dL (ref 13.0–17.0)
MCH: 32.6 pg (ref 26.0–34.0)
MCHC: 34.4 g/dL (ref 30.0–36.0)
MCV: 94.8 fL (ref 78.0–100.0)
PLATELETS: 156 10*3/uL (ref 150–400)
RBC: 4.26 MIL/uL (ref 4.22–5.81)
RDW: 13.1 % (ref 11.5–15.5)
WBC: 6.3 10*3/uL (ref 4.0–10.5)

## 2017-04-12 LAB — GLUCOSE, CAPILLARY
Glucose-Capillary: 97 mg/dL (ref 65–99)
Glucose-Capillary: 122 mg/dL — ABNORMAL HIGH (ref 65–99)

## 2017-04-12 LAB — BASIC METABOLIC PANEL
Anion gap: 9 (ref 5–15)
BUN: 13 mg/dL (ref 6–20)
CO2: 25 mmol/L (ref 22–32)
Calcium: 8.4 mg/dL — ABNORMAL LOW (ref 8.9–10.3)
Chloride: 106 mmol/L (ref 101–111)
Creatinine, Ser: 0.85 mg/dL (ref 0.61–1.24)
GFR calc Af Amer: >60 mL/min (ref >60)
GFR calc non Af Amer: >60 mL/min (ref >60)
Glucose, Bld: 108 mg/dL — ABNORMAL HIGH (ref 65–99)
POTASSIUM: 3.9 mmol/L (ref 3.5–5.1)
Sodium: 140 mmol/L (ref 135–145)

## 2017-04-12 MED ORDER — ASPIRIN 81 MG PO TABS: 81.0000 mg | ORAL_TABLET | Freq: Every day | ORAL | 3 refills | Status: DC

## 2017-04-12 MED FILL — Nitroglycerin IV Soln 100 MCG/ML in D5W: INTRA_ARTERIAL | Qty: 10 | Status: AC

## 2017-04-12 NOTE — Progress Notes (Signed)
CARDIAC REHAB PHASE I   PRE:  Rate/Rhythm: 66 SR    BP: sitting 137/43    SaO2: 97 RA  MODE:  Ambulation: 500 ft   POST:  Rate/Rhythm: 105 ST    BP: sitting 204/59, recheck 178/78    SaO2:    Tolerated well, no c/o. BP very elevated after walk. Ed completed. Good reception. Will refer to [place]. Understands importance of Plavix. [place], ACSM 04/12/2017 9:01 AM

## 2017-04-12 NOTE — Discharge Summary (Signed)
Discharge Summary    Patient ID: Justin Carr,  MRN: [id number], DOB/AGE: Oct 15, 1945 72 y.o.  Admit date: 04/11/2017 Discharge date: 04/12/2017  Primary Care Provider: Shirline Frees Primary Cardiologist: Dr. [name]  Discharge Diagnoses    Active Problems:   Coronary artery disease   CAD (coronary artery disease)   Stable angina (HCC)   Abnormal nuclear stress test  Allergies Allergies  Allergen Reactions  . Peanuts [Peanut Oil] Anaphylaxis  . Lipitor [Atorvastatin] Other (See Comments)    Muscle cramps  . Rosuvastatin Other (See Comments)    Muscle cramps    Diagnostic Studies/Procedures    LEFT HEART CATH AND CORONARY ANGIOGRAPHY  Conclusion     Prox LAD lesion is 99% stenosed.  A stent was successfully placed.  Post intervention, there is a 0% residual stenosis.  The left ventricular systolic function is normal.  LV end diastolic pressure is normal.  The left ventricular ejection fraction is 55-65% by visual estimate.  IMPRESSION:Successful proximal LAD PCI and drug-eluting stent for in-stent restenosis within a previously placed LAD stent April 2009. The patient had an excellent result. He had already been on antiplatelet therapy. He will continue this indefinitely. He'll be hydrated overnight and discharged home in the morning. I will see him back in the office in 2-3 weeks.      History of Present Illness     72 y.o. male obstructive sleep apnea, on CPAP, hypertension, hyperlipidemia, CAD and renal artery stenosis presented for cath.   History of CAD status post non-ST segment elevation myocardial infarction 07/02/07. He underwent PCI and drug-eluting stenting of his proximal LAD with a Taxus Liberte drug-eluting stent. He had also had a 60% right renal artery stenosis documented angiographically at that time, which we have been following by duplex ultrasound.  Seen by Dr. [name] 03/27/17 for symptoms concerning for angina. Myoview stress test came  back as  high risk chemistry ischemia in the LAD territory. The plan of performing outpatient diagnostic cardiac catheterization via the right radial approach.   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  Recent lipid profile performed by his PCP3/29/18 revealing a total cholesterol 113, LDL 48 and HDL of 35.    Hospital Course     Consultants: None    1. CAD - S/p Successful proximal LAD PCI and drug-eluting stent for in-stent restenosis within a previously placed LAD stent April 2009.  - Continue DAPT indefinitely. Continue statin, Imdur and ACE.  - Kidney function and electrolytes are stable. Ambulated well without angina.  - He is on low-dose Imdur which may  be stopped in the outpatient setting.  He is on a decent dose of Crestor being adjusted by his outpatient cardiologist. He is on an ACE inhibitor, not on beta-blocker.  He has some baseline bradycardia.  May be able to titrate up antihypertensives in the outpatient setting.   The patient has been seen by Dr. Ellyn Hack  today and deemed ready for discharge home. All follow-up appointments have been scheduled. Discharge medications are listed below.    Discharge Vitals Blood pressure (!) 137/43, pulse 64, temperature 97.7 F (36.5 C), temperature source Oral, resp. rate 14, height 5\' 11"  (1.803 m), weight 210 lb 12.2 oz (95.6 kg), SpO2 96 %.  Filed Weights   04/11/17 1241 04/12/17 0440  Weight: 211 lb (95.7 kg) 210 lb 12.2 oz (95.6 kg)  Labs & Radiologic Studies     CBC Recent Labs    04/12/17 0528  WBC 6.3  HGB 13.9  HCT 40.4  MCV 94.8  PLT 601   Basic Metabolic Panel Recent Labs    04/12/17 0528  NA 140  K 3.9  CL 106  CO2 25  GLUCOSE 108*  BUN 13  CREATININE 0.85  CALCIUM 8.4*    Dg Chest 2 View  Result Date: 04/06/2017 CLINICAL DATA:  Chest pain EXAM: CHEST  2 VIEW COMPARISON:  None.  FINDINGS: Heart and mediastinal contours are within normal limits. No focal opacities or effusions. No acute bony abnormality. IMPRESSION: No active cardiopulmonary disease. Electronically Signed   By: Rolm Baptise M.D.   On: 04/06/2017 10:08    Disposition   Pt is being discharged home today in good condition.  Follow-up Plans & Appointments    Follow-up Information    [name], MD. Go on 04/24/2017.   Specialties:  Cardiology, Radiology Why:  @1 :45pm for hospital follow up Contact information: [address] [place] [address] [phone]          Discharge Instructions    AMB Referral to Cardiac Rehabilitation - Phase II   Complete by:  As directed    Diagnosis:  Coronary Stents   Diet - low sodium heart healthy   Complete by:  As directed    Discharge instructions   Complete by:  As directed    No driving for 48 hours. No lifting over 5 lbs for 1 week. No sexual activity for 1 week. You may return to work on 04/16/17. Keep procedure site clean & dry. If you notice increased pain, swelling, bleeding or pus, call/return!  You may shower, but no soaking baths/hot tubs/pools for 1 week.   Hold metformin today. Resume tomorrow.   Increase activity slowly   Complete by:  As directed       Discharge Medications   Allergies as of 04/12/2017      Reactions   Peanuts [peanut Oil] Anaphylaxis   Lipitor [atorvastatin] Other (See Comments)   Muscle cramps   Rosuvastatin Other (See Comments)   Muscle cramps      Medication List    TAKE these medications   ALPHA-LIPOIC ACID PO Take 1 tablet by mouth daily.   aspirin 81 MG tablet Take 1 tablet (81 mg total) by mouth daily. What changed:    medication strength  how much to take   B-12 2500 MCG Tabs Take 2,500 mcg by mouth daily.   clopidogrel 75 MG tablet Commonly known as:  PLAVIX Take 1 tablet (75 mg total) by mouth daily.   CO Q 10 PO Take 1 capsule by mouth daily.   gabapentin  300 MG capsule Commonly known as:  NEURONTIN Take 1 capsule (300 mg total) by mouth 3 (three) times daily. What changed:  when to take this   isosorbide mononitrate 30 MG 24 hr tablet Commonly known as:  IMDUR Take 0.5 tablets (15 mg total) by mouth daily.   metFORMIN 1000 MG tablet Commonly known as:  GLUCOPHAGE Take 1,000 mg by mouth 2 (two) times daily with a meal.   multivitamin with minerals Tabs tablet Take 1 tablet by mouth daily.   nitroGLYCERIN 0.4 MG SL tablet Commonly known as:  NITROSTAT Place 1 tablet (0.4 mg total) under the tongue every 5 (five) minutes as needed for chest pain.   PRESERVISION AREDS PO Take 1 capsule by mouth daily.  ramipril 2.5 MG capsule Commonly known as:  ALTACE Take 1 capsule (2.5 mg total) by mouth daily.   ranitidine 150 MG tablet Commonly known as:  ZANTAC Take 1 tablet (150 mg total) by mouth 2 (two) times daily.   rosuvastatin 20 MG tablet Commonly known as:  CRESTOR Take 1 tablet (20 mg total) by mouth 3 (three) times a week.   sildenafil 20 MG tablet Commonly known as:  REVATIO Take 20-100 mg by mouth as needed (for erectile disfunction).   sitaGLIPtin 50 MG tablet Commonly known as:  JANUVIA Take 50 mg by mouth daily.         Outstanding Labs/Studies   None  Duration of Discharge Encounter   Greater than 30 minutes including physician time.  Signed, Crista Luria Anzleigh Slaven PA-C 04/12/2017, 8:48 AM

## 2017-04-12 NOTE — Progress Notes (Signed)
Progress Note  Patient Name: Justin Carr Date of Encounter: 04/12/2017  Primary Cardiologist: Quay Burow, MD   Subjective   Feeling well. No chest pain, sob or palpitations.   Inpatient Medications    Scheduled Meds: . aspirin  81 mg Oral Daily  . clopidogrel  75 mg Oral Daily  . famotidine  20 mg Oral BID  . gabapentin  300 mg Oral TID  . isosorbide mononitrate  15 mg Oral Daily  . ramipril  2.5 mg Oral Daily  . rosuvastatin  20 mg Oral Once per day on Mon Wed Fri  . sodium chloride flush  3 mL Intravenous Q12H   Continuous Infusions: . sodium chloride     PRN Meds: sodium chloride, acetaminophen, morphine injection, nitroGLYCERIN, ondansetron (ZOFRAN) IV, sodium chloride flush   Vital Signs    Vitals:   04/11/17 1700 04/11/17 1930 04/11/17 2000 04/12/17 0440  BP: (!) 147/78 (!) 120/56 (!) 148/49 136/61  Pulse: 64 74 77 63  Resp: 12 16  16   Temp:  97.8 F (36.6 C)  98 F (36.7 C)  TempSrc:  Oral  Oral  SpO2: 98% 97% 96% 97%  Weight:    210 lb 12.2 oz (95.6 kg)  Height:        Intake/Output Summary (Last 24 hours) at 04/12/2017 0749 Last data filed at 04/12/2017 0400 Gross per 24 hour  Intake 1117.5 ml  Output 1000 ml  Net 117.5 ml   Filed Weights   04/11/17 1241 04/12/17 0440  Weight: 211 lb (95.7 kg) 210 lb 12.2 oz (95.6 kg)    Telemetry    SR - Personally Reviewed  ECG    Sinus bradycardia at rate of 55 bpm- Personally Reviewed  Physical Exam   GEN: No acute distress.   Neck: No JVD Cardiac: RRR, no murmurs, rubs, or gallops. Right radial cath site without hematoma or bruit.  Respiratory: Clear to auscultation bilaterally. GI: Soft, nontender, non-distended  MS: No edema; No deformity. Neuro:  Nonfocal  Psych: Normal affect   Labs    Chemistry Recent Labs  Lab 04/05/17 1539  NA 142  K 4.9  CL 102  CO2 23  GLUCOSE 145*  BUN 14  CREATININE 0.82  CALCIUM 9.8  GFRNONAA 89  GFRAA 103     Hematology Recent Labs  Lab  04/05/17 1539 04/12/17 0528  WBC 7.7 6.3  RBC 4.76 4.26  HGB 15.4 13.9  HCT 44.9 40.4  MCV 94 94.8  MCH 32.4 32.6  MCHC 34.3 34.4  RDW 13.9 13.1  PLT 204 156    Radiology    No results found.  Cardiac Studies   LEFT HEART CATH AND CORONARY ANGIOGRAPHY  Conclusion     Prox LAD lesion is 99% stenosed.  A stent was successfully placed.  Post intervention, there is a 0% residual stenosis.  The left ventricular systolic function is normal.  LV end diastolic pressure is normal.  The left ventricular ejection fraction is 55-65% by visual estimate.  IMPRESSION: Successful proximal LAD PCI and drug-eluting stent for in-stent restenosis within a previously placed LAD stent April 2009. The patient had an excellent result. He had already been on antiplatelet therapy. He will continue this indefinitely. He'll be hydrated overnight and discharged home in the morning. I will see him back in the office in 2-3 weeks.   Patient Profile     72 y.o. male obstructive sleep apnea, on CPAP, hypertension, hyperlipidemia, CAD and renal artery stenosis presented for  cath.   History of CAD status post non-ST segment elevation myocardial infarction 07/02/07. He underwent PCI and drug-eluting stenting of his proximal LAD with a Taxus Liberte drug-eluting stent. He had also had a 60% right renal artery stenosis documented angiographically at that time, which we have been following by duplex ultrasound.  Seen by Dr. [name] 03/27/17 for symptoms concerning for angina. Myoview stress test came back as  high risk chemistry ischemia in the LAD territory. The plan of performing outpatient diagnostic cardiac catheterization via the right radial approach.   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  Recent lipid profile performed by his PCP 06/21/16 revealing a total cholesterol 113, LDL 48 and HDL of 35.     Assessment & Plan    1. CAD - S/p Successful proximal LAD PCI and drug-eluting stent for in-stent restenosis within a previously placed LAD stent April 2009.  - Continue DAPT indefinitely. Continue statin, Imdur and ACE.  - Kidney function and electrolytes are stable.   For questions or updates, please contact [facility] Please consult [email or web address] for contact info under Cardiology/STEMI.      SignedCrista Luria [place], PA  04/12/2017, 7:49 AM    I have seen, examined and evaluated the patient this AM along with Mr. Curly [place], [place] on rounds..  After reviewing all the available data and chart, we discussed the patients laboratory, study & physical findings as well as symptoms in detail. I agree with his findings, examination as well as impression recommendations as per our discussion.     patient looks good status post PCI to the LAD.  Okay for discharge on aspirin/Plavix for DAPT coverage of times 1 year.  He is on low-dose Imdur which may be L be stopped in the outpatient setting.  He is on a decent dose of Crestor being adjusted by his outpatient cardiologist. He is on an ACE inhibitor, not on beta-blocker.  He has some baseline bradycardia.  May be able to titrate up antihypertensives in the outpatient setting.  Okay for discharge from a cardiac standpoint.  Radial site is clean dry and intact.  Exam is benign.  He is walking with cardiac rehab at this point.    Glenetta Hew, M.D., M.S. Interventional Cardiologist   Pager # [phone] Phone # [phone] [address] [place], [place] [address]

## 2017-04-16 ENCOUNTER — Telehealth (HOSPITAL_COMMUNITY): Payer: Self-pay

## 2017-04-16 NOTE — Telephone Encounter (Signed)
Patients insurance is active and benefits verified through Medicare Part A & B - No co-pay, deductible amount of $185.00/$185.00 has been met, no out of pocket, 20% co-insurance, and no pre-authorization is required. Passport/reference #[id number] ° °Patients insurance is active and benefits verified through [employer] - $30.00 co-pay, no deductible, out of pocket amount of $5,500/$50.00 has been met, no co-insurance, and no pre-authorization is required. Passport/reference #[id number] ° °Patient will be contacted and scheduled after review by the RN Navigator. °

## 2017-04-19 ENCOUNTER — Telehealth (HOSPITAL_COMMUNITY): Payer: Self-pay

## 2017-04-19 NOTE — Telephone Encounter (Signed)
Called and spoke with patient in regards to Cardiac Rehab - Patient is interested in the program. Scheduled orientation on 05/28/2017 at 8:45am. Patient will attend the 8:15am exc class.

## 2017-04-24 ENCOUNTER — Ambulatory Visit (INDEPENDENT_AMBULATORY_CARE_PROVIDER_SITE_OTHER): Payer: Medicare Other | Admitting: Cardiovascular Disease

## 2017-04-24 ENCOUNTER — Encounter: Payer: Self-pay | Admitting: Cardiovascular Disease

## 2017-04-24 VITALS — BP 136/62 | HR 76 | Ht 71.0 in | Wt 216.0 lb

## 2017-04-24 DIAGNOSIS — I701 Atherosclerosis of renal artery: Secondary | ICD-10-CM

## 2017-04-24 DIAGNOSIS — I2583 Coronary atherosclerosis due to lipid rich plaque: Secondary | ICD-10-CM

## 2017-04-24 DIAGNOSIS — I2510 Atherosclerotic heart disease of native coronary artery without angina pectoris: Secondary | ICD-10-CM | POA: Diagnosis not present

## 2017-04-24 DIAGNOSIS — E7800 Pure hypercholesterolemia, unspecified: Secondary | ICD-10-CM

## 2017-04-24 DIAGNOSIS — I10 Essential (primary) hypertension: Secondary | ICD-10-CM | POA: Diagnosis not present

## 2017-04-24 NOTE — Assessment & Plan Note (Signed)
History of CAD status post non-ST segment elevation myocardial infarction [facility] H/09. And/or PCI and drug-eluting stenting of the proximal LAD with a Taxus Liberte drug-eluting stent and he did well since. He was complaining of some left upper extremity, inner arm pain reminiscent of his prior symptoms. Because of this he had a Myoview stress test performed 04/04/17 that was high risk demonstrating ischemia in the LAD territory. This led to a cardiac catheterization by myself via the right radial approach 04/11/17 revealing 99% "in-stent restenosis within the previously placed LAD stent. I restented him with a 3 mm x 20 mm long surgery drug-eluting stent post dilating up to 3.23 mm resulting reduction at 99% "in-stent restenosis to 0% residual. He has done well since.

## 2017-04-24 NOTE — Assessment & Plan Note (Signed)
History of essential hypertension blood pressure measures at 136/62. He is on ramipril. The current meds at current dosing.

## 2017-04-24 NOTE — Patient Instructions (Signed)
Medication Instructions: Your physician recommends that you continue on your current medications as directed. Please refer to the Current Medication list given to you today.  STOP Isosorbide   Follow-Up: Your physician wants you to follow-up in: 6 months with Dr. [name]. You will receive a reminder letter in the mail two months in advance. If you don't receive a letter, please call our office to schedule the follow-up appointment.  If you need a refill on your cardiac medications before your next appointment, please call your pharmacy.

## 2017-04-24 NOTE — Assessment & Plan Note (Signed)
History of hyperlipidemia on statin therapy followed by his PCP 

## 2017-04-24 NOTE — Progress Notes (Signed)
04/24/2017 Justin Carr   1945/11/11  [id number]  Primary Physician Shirline Frees, MD Primary Cardiologist: [name] MD Garret Reddish, [place], [place]  HPI:  Justin Carr is a 72 y.o.   mildly overweight married Caucasian male, father of 2 children (1 living), who I saw 03/27/17. 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 His problems include obstructive sleep apnea, on CPAP, hypertension, and hyperlipidemia. He denies chest pain or shortness of breath. He had a non-ST-segment-elevation myocardial infarction, July 02, 2007. He underwent PCI and stenting of his proximal LAD with a Taxus Liberte drug-eluting stent. He had also had a 60% right renal artery stenosis documented angiographically at that time, which we have been following by duplex ultrasound. Since I saw him back a year ago he denies chest pain or shortness of breath but has noticed some left upper extremity discomfort with exertion similar to his pre-MI symptoms. .Recent lipid profile performed by his PCP 06/21/16 revealing a total cholesterol 113, LDL 48 and HDL of 35.  he underwent Myoview stress testing 04/04/17 that showed ischemia in the LAD territory. I performed radial diagnostic cath on him 04/11/17 revealed a 99% "in-stent restenosis" within the proximal LAD stent. Remainder of his coronary anatomy was free of significant disease in his LV function was normal. I restarted him with a 3 mm x 20 mm long synergy drug-eluting stent postdilated to 3.23 mm resulting reduction a 9% stenosis to 0% residual. He has done well since.     Current Meds  Medication Sig  . ALPHA-LIPOIC ACID PO Take 1 tablet by mouth daily.  Marland Kitchen aspirin 81 MG tablet Take 1 tablet (81 mg total) by mouth daily.  . clopidogrel (PLAVIX) 75 MG tablet Take 1 tablet (75 mg total) by mouth daily.  . Coenzyme Q10 (CO Q 10 PO) Take 1 capsule by mouth daily.   .  Cyanocobalamin (B-12) 2500 MCG TABS Take 2,500 mcg by mouth daily.   Marland Kitchen gabapentin (NEURONTIN) 300 MG capsule Take 1 capsule (300 mg total) by mouth 3 (three) times daily. (Patient taking differently: Take 300 mg by mouth at bedtime. )  . metFORMIN (GLUCOPHAGE) 1000 MG tablet Take 1,000 mg by mouth 2 (two) times daily with a meal.  . Multiple Vitamin (MULTIVITAMIN WITH MINERALS) TABS tablet Take 1 tablet by mouth daily.  . Multiple Vitamins-Minerals (PRESERVISION AREDS PO) Take 1 capsule by mouth daily.   . nitroGLYCERIN (NITROSTAT) 0.4 MG SL tablet Place 1 tablet (0.4 mg total) under the tongue every 5 (five) minutes as needed for chest pain.  . ramipril (ALTACE) 2.5 MG capsule Take 1 capsule (2.5 mg total) by mouth daily.  . ranitidine (ZANTAC) 150 MG tablet Take 1 tablet (150 mg total) by mouth 2 (two) times daily.  . rosuvastatin (CRESTOR) 20 MG tablet Take 1 tablet (20 mg total) by mouth 3 (three) times a week.  . sildenafil (REVATIO) 20 MG tablet Take 20-100 mg by mouth as needed (for erectile disfunction).   . sitaGLIPtin (JANUVIA) 50 MG tablet Take 50 mg by mouth daily.  . [DISCONTINUED] isosorbide mononitrate (IMDUR) 30 MG 24 hr tablet Take 0.5 tablets (15 mg total) by mouth daily.     Allergies  Allergen Reactions  . Peanuts [Peanut Oil] Anaphylaxis  . Lipitor [Atorvastatin] Other (See Comments)    Muscle cramps  . Rosuvastatin Other (See Comments)    Muscle cramps  Social History   Socioeconomic History  . Marital status: Married    Spouse name: Not on file  . Number of children: 2  . Years of education: College +  . Highest education level: Not on file  Social Needs  . Financial resource strain: Not on file  . Food insecurity - worry: Not on file  . Food insecurity - inability: Not on file  . Transportation needs - medical: Not on file  . Transportation needs - non-medical: Not on file  Occupational History  . Occupation: Retired  Tobacco Use  . Smoking status:  Former Smoker    Packs/day: 1.00    Years: 30.00    Pack years: 30.00    Types: Cigarettes    Last attempt to quit: 07/03/2006    Years since quitting: 10.8  . Smokeless tobacco: Never Used  Substance and Sexual Activity  . Alcohol use: Yes    Comment: 04/11/2017 "might have a beer q other week"  . Drug use: No  . Sexual activity: Not Currently  Other Topics Concern  . Not on file  Social History Narrative   1 cup coffee and 2 sodas per day.   Lives at home with his wife.   Right-handed.     Review of Systems: General: negative for chills, fever, night sweats or weight changes.  Cardiovascular: negative for chest pain, dyspnea on exertion, edema, orthopnea, palpitations, paroxysmal nocturnal dyspnea or shortness of breath Dermatological: negative for rash Respiratory: negative for cough or wheezing Urologic: negative for hematuria Abdominal: negative for nausea, vomiting, diarrhea, bright red blood per rectum, melena, or hematemesis Neurologic: negative for visual changes, syncope, or dizziness All other systems reviewed and are otherwise negative except as noted above.    Blood pressure 136/62, pulse 76, height 5\' 11"  (1.803 m), weight 216 lb (98 kg).  General appearance: alert and no distress Neck: no adenopathy, no carotid bruit, no JVD, supple, symmetrical, trachea midline and thyroid not enlarged, symmetric, no tenderness/mass/nodules Lungs: clear to auscultation bilaterally Heart: regular rate and rhythm, S1, S2 normal, no murmur, click, rub or gallop Extremities: extremities normal, atraumatic, no cyanosis or edema Pulses: 2+ and symmetric Skin: Skin color, texture, turgor normal. No rashes or lesions Neurologic: Alert and oriented X 3, normal strength and tone. Normal symmetric reflexes. Normal coordination and gait  EKG sinus rhythm at 78 with bigeminal PACs. I personally reviewed this EKG.  ASSESSMENT AND PLAN:   Coronary artery disease History of CAD status  post non-ST segment elevation myocardial infarction [facility] H/09. And/or PCI and drug-eluting stenting of the proximal LAD with a Taxus Liberte drug-eluting stent and he did well since. He was complaining of some left upper extremity, inner arm pain reminiscent of his prior symptoms. Because of this he had a Myoview stress test performed 04/04/17 that was high risk demonstrating ischemia in the LAD territory. This led to a cardiac catheterization by myself via the right radial approach 04/11/17 revealing 99% "in-stent restenosis within the previously placed LAD stent. I restented him with a 3 mm x 20 mm long surgery drug-eluting stent post dilating up to 3.23 mm resulting reduction at 99% "in-stent restenosis to 0% residual. He has done well since.  Essential hypertension History of essential hypertension blood pressure measures at 136/62. He is on ramipril. The current meds at current dosing.  Hyperlipidemia History of hyperlipidemia on statin therapy followed by his PCP      [name] MD [facility], [facility] 04/24/2017 1:56 PM

## 2017-05-22 ENCOUNTER — Ambulatory Visit (INDEPENDENT_AMBULATORY_CARE_PROVIDER_SITE_OTHER): Payer: Medicare Other | Admitting: Podiatry

## 2017-05-22 ENCOUNTER — Encounter: Payer: Self-pay | Admitting: Podiatry

## 2017-05-22 DIAGNOSIS — G629 Polyneuropathy, unspecified: Secondary | ICD-10-CM | POA: Diagnosis not present

## 2017-05-22 DIAGNOSIS — L600 Ingrowing nail: Secondary | ICD-10-CM

## 2017-05-22 DIAGNOSIS — I701 Atherosclerosis of renal artery: Secondary | ICD-10-CM

## 2017-05-22 NOTE — Progress Notes (Signed)
Subjective:   Patient ID: Justin Carr, male   DOB: 72 y.o.   MRN: [id number]   HPI Patient presents stating this big toenail my left is become increasingly damaged hard for me to cut and worried about infection and I know only to have it removed in the right nails also abnormal but not to the same degree.  Patient also states the numbness seems to be getting gradually worse   ROS      Objective:  Physical Exam  Neurovascular status intact with thick deformed left hallux nail over right with neuropathic changes noted bilateral     Assessment:  Chronic neuropathy with thickened damaged nail hallux left     Plan:  H&P condition reviewed and recommended nail removal.  Patient wants procedure understanding risk and today I infiltrated the left hallux 60 mg Xylocaine Marcaine mixture and before taking off I explained all risk factors will be a permanent procedure.  I removed the nail under sterile conditions with sterile instrumentation and applied phenol to the base and applied sterile dressing and instructed on keeping the bandage on for 24 hours and less throbbing should occur in the toe where he will remove it and begin soaks.  Reappoint to recheck and was given all instructions and we discussed his neuropathy and he will utilize vitamin complexes and we will consider utilizing gabapentin to a higher degree

## 2017-05-22 NOTE — Patient Instructions (Signed)

## 2017-05-27 ENCOUNTER — Telehealth (HOSPITAL_COMMUNITY): Payer: Self-pay

## 2017-05-27 NOTE — Telephone Encounter (Signed)
Cardiac Rehab Medication Review by a Pharmacist  Does the patient  feel that his/her medications are working for him/her?  yes  Has the patient been experiencing any side effects to the medications prescribed?  No. Patient had muscle cramps with lipitor and daily crestor. Has switched to 3 times weekly dosing and has had no adverse effects since.  Does the patient measure his/her own blood pressure or blood glucose at home?  yes   Does the patient have any problems obtaining medications due to transportation or finances?   no  Understanding of regimen: good Understanding of indications: good Potential of compliance: good    Pharmacist comments: Patient was able to report what he was taking and familiar with the indications. Compliance seems good.     Aiyla Baucom A Evalyse Stroope 05/27/2017 10:45 AM

## 2017-05-28 ENCOUNTER — Encounter (HOSPITAL_COMMUNITY)
Admission: RE | Admit: 2017-05-28 | Discharge: 2017-05-28 | Disposition: A | Discharge status: Home / Self Care | Payer: Medicare Other | Source: Ambulatory Visit | Attending: Cardiovascular Disease | Admitting: Cardiovascular Disease

## 2017-05-28 ENCOUNTER — Encounter (HOSPITAL_COMMUNITY): Payer: Self-pay

## 2017-05-28 VITALS — BP 118/60 | HR 73 | Ht 70.5 in | Wt 215.2 lb

## 2017-05-28 DIAGNOSIS — I10 Essential (primary) hypertension: Secondary | ICD-10-CM | POA: Diagnosis not present

## 2017-05-28 DIAGNOSIS — Z7982 Long term (current) use of aspirin: Secondary | ICD-10-CM | POA: Diagnosis not present

## 2017-05-28 DIAGNOSIS — G4733 Obstructive sleep apnea (adult) (pediatric): Secondary | ICD-10-CM | POA: Diagnosis not present

## 2017-05-28 DIAGNOSIS — Z87891 Personal history of nicotine dependence: Secondary | ICD-10-CM | POA: Insufficient documentation

## 2017-05-28 DIAGNOSIS — Z955 Presence of coronary angioplasty implant and graft: Secondary | ICD-10-CM | POA: Diagnosis not present

## 2017-05-28 DIAGNOSIS — Z79899 Other long term (current) drug therapy: Secondary | ICD-10-CM | POA: Diagnosis not present

## 2017-05-28 DIAGNOSIS — E119 Type 2 diabetes mellitus without complications: Secondary | ICD-10-CM | POA: Insufficient documentation

## 2017-05-28 DIAGNOSIS — I214 Non-ST elevation (NSTEMI) myocardial infarction: Secondary | ICD-10-CM | POA: Diagnosis not present

## 2017-05-28 DIAGNOSIS — E785 Hyperlipidemia, unspecified: Secondary | ICD-10-CM | POA: Insufficient documentation

## 2017-05-28 NOTE — Progress Notes (Signed)
Cardiac Individual Treatment Plan  Patient Details  Name: Justin Carr MRN: [id number] Date of Birth: [birth year] Referring Provider:     CARDIAC REHAB PHASE II ORIENTATION from 05/28/2017 in [facility]  Referring Provider  Quay Burow, MD.      Initial Encounter Date:    CARDIAC REHAB PHASE II ORIENTATION from 05/28/2017 in [facility]  Date  05/28/17  Referring Provider  Quay Burow, MD.      Visit Diagnosis: Status post coronary artery stent placement 04/11/17 [facility]  Patient's Home Medications on Admission:  Current Outpatient Medications:  .  ALPHA-LIPOIC ACID PO, Take 1 tablet by mouth daily., Disp: , Rfl:  .  aspirin 81 MG tablet, Take 1 tablet (81 mg total) by mouth daily., Disp: 90 tablet, Rfl: 3 .  clopidogrel (PLAVIX) 75 MG tablet, Take 1 tablet (75 mg total) by mouth daily., Disp: 90 tablet, Rfl: 3 .  Coenzyme Q10 (CO Q 10 PO), Take 1 capsule by mouth daily. , Disp: , Rfl:  .  Cyanocobalamin (B-12) 2500 MCG TABS, Take 2,500 mcg by mouth daily. , Disp: , Rfl:  .  gabapentin (NEURONTIN) 300 MG capsule, Take 1 capsule (300 mg total) by mouth 3 (three) times daily. (Patient taking differently: Take 300 mg by mouth at bedtime. ), Disp: 90 capsule, Rfl: 3 .  metFORMIN (GLUCOPHAGE) 1000 MG tablet, Take 1,000 mg by mouth 2 (two) times daily with a meal., Disp: , Rfl:  .  Multiple Vitamin (MULTIVITAMIN WITH MINERALS) TABS tablet, Take 1 tablet by mouth daily., Disp: , Rfl:  .  Multiple Vitamins-Minerals (PRESERVISION AREDS PO), Take 1 capsule by mouth daily. , Disp: , Rfl:  .  nitroGLYCERIN (NITROSTAT) 0.4 MG SL tablet, Place 1 tablet (0.4 mg total) under the tongue every 5 (five) minutes as needed for chest pain., Disp: 25 tablet, Rfl: 0 .  ramipril (ALTACE) 2.5 MG capsule, Take 1 capsule (2.5 mg total) by mouth daily., Disp: 90 capsule, Rfl: 3 .  ranitidine (ZANTAC) 150 MG tablet, Take 1 tablet (150 mg total) by  mouth 2 (two) times daily., Disp: 180 tablet, Rfl: 3 .  rosuvastatin (CRESTOR) 20 MG tablet, Take 1 tablet (20 mg total) by mouth 3 (three) times a week., Disp: 36 tablet, Rfl: 3 .  sildenafil (REVATIO) 20 MG tablet, Take 20-100 mg by mouth as needed (for erectile disfunction). , Disp: , Rfl:  .  sitaGLIPtin (JANUVIA) 50 MG tablet, Take 50 mg by mouth daily., Disp: , Rfl:   Past Medical History: Past Medical History:  Diagnosis Date  . Coronary artery disease   . GERD (gastroesophageal reflux disease)   . Hyperlipidemia   . Hypertension   . NSTEMI (non-ST elevated myocardial infarction) ([place]) 06/2007   [name] 07/27/2010  . OSA on CPAP   . Palpitations   . Pneumonia ~ 1971 X 1  . Type II diabetes mellitus ([facility])     Tobacco Use: Social History   Tobacco Use  Smoking Status Former Smoker  . Packs/day: 1.00  . Years: 30.00  . Pack years: 30.00  . Types: Cigarettes  . Last attempt to quit: 07/03/2006  . Years since quitting: 10.9  Smokeless Tobacco Never Used    Labs: Recent Review Scientist, physiological    Labs for ITP Cardiac and Pulmonary Rehab Latest Ref Rng & Units 07/02/2007 07/03/2007 06/21/2016   Cholestrol <200 mg/dL - 199 ATP III CLASSIFICATION: <200     mg/dL  Desirable 200-239  mg/dL   Borderline High >=240    mg/dL   High 113   LDLCALC <100 mg/dL - 139 Total Cholesterol/HDL:CHD Risk Coronary Heart Disease Risk Table Men   Women 1/2 Average Risk   3.4   3.3(H) 48   HDL >40 mg/dL - 30(L) 35(L)   Trlycerides <150 mg/dL - 150(H) 151(H)   Hemoglobin A1c - - 6.2 (NOTE)   The ADA recommends the following therapeutic goals for glycemic   control related to Hgb A1C measurement:   Goal of Therapy:   < 7.0% Hgb A1C   Action Suggested:  > 8.0% Hgb A1C   Ref:  Diabetes Care, 22, Suppl. 1, 1999(H) -   TCO2 - 29 - -      Capillary Blood Glucose: Lab Results  Component Value Date   GLUCAP 122 (H) 04/12/2017   GLUCAP 118 (H) 04/11/2017   GLUCAP 97 04/11/2017   GLUCAP 143 (H)  04/11/2017   GLUCAP 127 (H) 04/03/2013     Exercise Target Goals: Date: 05/28/17  Exercise Program Goal: Individual exercise prescription set using results from initial 6 min walk test and THRR while considering  patient's activity barriers and safety.   Exercise Prescription Goal: Initial exercise prescription builds to 30-45 minutes a day of aerobic activity, 2-3 days per week.  Home exercise guidelines will be given to patient during program as part of exercise prescription that the participant will acknowledge.  Activity Barriers & Risk Stratification: Activity Barriers & Cardiac Risk Stratification - 05/28/17 1107      Activity Barriers & Cardiac Risk Stratification   Activity Barriers  Other (comment)    Comments  Right lateral hip pain.    Cardiac Risk Stratification  Moderate       6 Minute Walk: 6 Minute Walk    Row Name 05/28/17 0954         6 Minute Walk   Phase  Initial     Distance  1643 feet     Walk Time  6 minutes     # of Rest Breaks  0     MPH  3.11     METS  3.33     RPE  11     VO2 Peak  11.66     Symptoms  No     Resting HR  73 bpm     Resting BP  118/60     Resting Oxygen Saturation   98 %     Exercise Oxygen Saturation  during 6 min walk  98 %     Max Ex. HR  94 bpm     Max Ex. BP  142/64     2 Minute Post BP  128/64        Oxygen Initial Assessment:   Oxygen Re-Evaluation:   Oxygen Discharge (Final Oxygen Re-Evaluation):   Initial Exercise Prescription: Initial Exercise Prescription - 05/28/17 1100      Date of Initial Exercise RX and Referring Provider   Date  05/28/17    Referring Provider  Quay Burow, MD.      Treadmill   MPH  2.6    Grade  1    Minutes  10    METs  3.35      Bike   Level  1.2    Minutes  10    METs  3.32      NuStep   Level  3    SPM  85    Minutes  10    METs  3      Prescription Details   Frequency (times per week)  3    Duration  Progress to 30 minutes of continuous aerobic  without signs/symptoms of physical distress      Intensity   THRR 40-80% of Max Heartrate  60-119    Ratings of Perceived Exertion  11-13    Perceived Dyspnea  0-4      Progression   Progression  Continue to progress workloads to maintain intensity without signs/symptoms of physical distress.      Resistance Training   Training Prescription  Yes    Weight  4lbs    Reps  10-15       Perform Capillary Blood Glucose checks as needed.  Exercise Prescription Changes:   Exercise Comments:   Exercise Goals and Review: Exercise Goals    Row Name 05/28/17 0929             Exercise Goals   Increase Physical Activity  Yes       Intervention  Provide advice, education, support and counseling about physical activity/exercise needs.;Develop an individualized exercise prescription for aerobic and resistive training based on initial evaluation findings, risk stratification, comorbidities and participant's personal goals.       Expected Outcomes  Short Term: Attend rehab on a regular basis to increase amount of physical activity.;Long Term: Exercising regularly at least 3-5 days a week.;Long Term: Add in home exercise to make exercise part of routine and to increase amount of physical activity.       Increase Strength and Stamina  Yes       Intervention  Provide advice, education, support and counseling about physical activity/exercise needs.;Develop an individualized exercise prescription for aerobic and resistive training based on initial evaluation findings, risk stratification, comorbidities and participant's personal goals.       Expected Outcomes  Short Term: Increase workloads from initial exercise prescription for resistance, speed, and METs.;Short Term: Perform resistance training exercises routinely during rehab and add in resistance training at home;Long Term: Improve cardiorespiratory fitness, muscular endurance and strength as measured by increased METs and functional capacity  (6MWT)       Able to understand and use rate of perceived exertion (RPE) scale  Yes       Intervention  Provide education and explanation on how to use RPE scale       Expected Outcomes  Short Term: Able to use RPE daily in rehab to express subjective intensity level;Long Term:  Able to use RPE to guide intensity level when exercising independently       Knowledge and understanding of Target Heart Rate Range (THRR)  Yes       Intervention  Provide education and explanation of THRR including how the numbers were predicted and where they are located for reference       Expected Outcomes  Short Term: Able to state/look up THRR;Long Term: Able to use THRR to govern intensity when exercising independently;Short Term: Able to use daily as guideline for intensity in rehab       Able to check pulse independently  Yes       Intervention  Provide education and demonstration on how to check pulse in carotid and radial arteries.;Review the importance of being able to check your own pulse for safety during independent exercise       Expected Outcomes  Short Term: Able to explain why pulse checking is important during independent exercise;Long Term: Able to  check pulse independently and accurately       Understanding of Exercise Prescription  Yes       Intervention  Provide education, explanation, and written materials on patient's individual exercise prescription       Expected Outcomes  Short Term: Able to explain program exercise prescription;Long Term: Able to explain home exercise prescription to exercise independently          Exercise Goals Re-Evaluation :    Discharge Exercise Prescription (Final Exercise Prescription Changes):   Nutrition:  Target Goals: Understanding of nutrition guidelines, daily intake of sodium 1500mg , cholesterol 200mg , calories 30% from fat and 7% or less from saturated fats, daily to have 5 or more servings of fruits and vegetables.  Biometrics: Pre Biometrics -  05/28/17 1112      Pre Biometrics   Height  5' 10.5" (1.791 m)    Weight  215 lb 2.7 oz (97.6 kg)    Waist Circumference  42.25 inches    Hip Circumference  44 inches    Waist to Hip Ratio  0.96 %    BMI (Calculated)  30.43    Triceps Skinfold  20 mm    % Body Fat  30.6 %    Grip Strength  47 kg    Flexibility  9 in    Single Leg Stand  30 seconds        Nutrition Therapy Plan and Nutrition Goals: Nutrition Therapy & Goals - 05/28/17 0930      Nutrition Therapy   Diet  Carb Modified, Heart Healthy      Personal Nutrition Goals   Nutrition Goal  Pt to identify and limit food sources of saturated fat, trans fat, and sodium      Intervention Plan   Intervention  Prescribe, educate and counsel regarding individualized specific dietary modifications aiming towards targeted core components such as weight, hypertension, lipid management, diabetes, heart failure and other comorbidities.    Expected Outcomes  Short Term Goal: Understand basic principles of dietary content, such as calories, fat, sodium, cholesterol and nutrients.;Long Term Goal: Adherence to prescribed nutrition plan.       Nutrition Assessments: Nutrition Assessments - 05/28/17 0930      MEDFICTS Scores   Pre Score  66       Nutrition Goals Re-Evaluation:   Nutrition Goals Re-Evaluation:   Nutrition Goals Discharge (Final Nutrition Goals Re-Evaluation):   Psychosocial: Target Goals: Acknowledge presence or absence of significant depression and/or stress, maximize coping skills, provide positive support system. Participant is able to verbalize types and ability to use techniques and skills needed for reducing stress and depression.  Initial Review & Psychosocial Screening: Initial Psych Review & Screening - 05/28/17 1200      Initial Review   Current issues with  None Identified      Family Dynamics   Good Support System?  Yes Kavian has his wife for support      Barriers   Psychosocial barriers  to participate in program  There are no identifiable barriers or psychosocial needs.      Screening Interventions   Interventions  Encouraged to exercise       Quality of Life Scores: Quality of Life - 05/28/17 1113      Quality of Life Scores   Health/Function Pre  27.9 %    Socioeconomic Pre  27.43 %    Psych/Spiritual Pre  30 %    Family Pre  27.6 %    GLOBAL Pre  28.19 %      Scores of 19 and below usually indicate a poorer quality of life in these areas.  A difference of  2-3 points is a clinically meaningful difference.  A difference of 2-3 points in the total score of the Quality of Life Index has been associated with significant improvement in overall quality of life, self-image, physical symptoms, and general health in studies assessing change in quality of life.  PHQ-9: Recent Review Flowsheet Data    There is no flowsheet data to display.     Interpretation of Total Score  Total Score Depression Severity:  1-4 = Minimal depression, 5-9 = Mild depression, 10-14 = Moderate depression, 15-19 = Moderately severe depression, 20-27 = Severe depression   Psychosocial Evaluation and Intervention:   Psychosocial Re-Evaluation:   Psychosocial Discharge (Final Psychosocial Re-Evaluation):   Vocational Rehabilitation: Provide vocational rehab assistance to qualifying candidates.   Vocational Rehab Evaluation & Intervention: Vocational Rehab - 05/28/17 1200      Initial Vocational Rehab Evaluation & Intervention   Assessment shows need for Vocational Rehabilitation  No Woodruff is retired and does not need vocational rehab at this time       Education: Education Goals: Education classes will be provided on a weekly basis, covering required topics. Participant will state understanding/return demonstration of topics presented.  Learning Barriers/Preferences: Learning Barriers/Preferences - 05/28/17 3086      Learning Barriers/Preferences   Learning Barriers  Sight     Learning Preferences  Video;Pictoral       Education Topics: Count Your Pulse:  -Group instruction provided by verbal instruction, demonstration, patient participation and written materials to support subject.  Instructors address importance of being able to find your pulse and how to count your pulse when at home without a heart monitor.  Patients get hands on experience counting their pulse with staff help and individually.   Heart Attack, Angina, and Risk Factor Modification:  -Group instruction provided by verbal instruction, video, and written materials to support subject.  Instructors address signs and symptoms of angina and heart attacks.    Also discuss risk factors for heart disease and how to make changes to improve heart health risk factors.   Functional Fitness:  -Group instruction provided by verbal instruction, demonstration, patient participation, and written materials to support subject.  Instructors address safety measures for doing things around the house.  Discuss how to get up and down off the floor, how to pick things up properly, how to safely get out of a chair without assistance, and balance training.   Meditation and Mindfulness:  -Group instruction provided by verbal instruction, patient participation, and written materials to support subject.  Instructor addresses importance of mindfulness and meditation practice to help reduce stress and improve awareness.  Instructor also leads participants through a meditation exercise.    Stretching for Flexibility and Mobility:  -Group instruction provided by verbal instruction, patient participation, and written materials to support subject.  Instructors lead participants through series of stretches that are designed to increase flexibility thus improving mobility.  These stretches are additional exercise for major muscle groups that are typically performed during regular warm up and cool down.   Hands Only CPR:  -Group  verbal, video, and participation provides a basic overview of AHA guidelines for community CPR. Role-play of emergencies allow participants the opportunity to practice calling for help and chest compression technique with discussion of AED use.   Hypertension: -Group verbal and written instruction that provides a basic overview  of hypertension including the most recent diagnostic guidelines, risk factor reduction with self-care instructions and medication management.    Nutrition I class: Heart Healthy Eating:  -Group instruction provided by PowerPoint slides, verbal discussion, and written materials to support subject matter. The instructor gives an explanation and review of the Therapeutic Lifestyle Changes diet recommendations, which includes a discussion on lipid goals, dietary fat, sodium, fiber, plant stanol/sterol esters, sugar, and the components of a well-balanced, healthy diet.   Nutrition II class: Lifestyle Skills:  -Group instruction provided by PowerPoint slides, verbal discussion, and written materials to support subject matter. The instructor gives an explanation and review of label reading, grocery shopping for heart health, heart healthy recipe modifications, and ways to make healthier choices when eating out.   Diabetes Question & Answer:  -Group instruction provided by PowerPoint slides, verbal discussion, and written materials to support subject matter. The instructor gives an explanation and review of diabetes co-morbidities, pre- and post-prandial blood glucose goals, pre-exercise blood glucose goals, signs, symptoms, and treatment of hypoglycemia and hyperglycemia, and foot care basics.   Diabetes Blitz:  -Group instruction provided by PowerPoint slides, verbal discussion, and written materials to support subject matter. The instructor gives an explanation and review of the physiology behind type 1 and type 2 diabetes, diabetes medications and rational behind using  different medications, pre- and post-prandial blood glucose recommendations and Hemoglobin A1c goals, diabetes diet, and exercise including blood glucose guidelines for exercising safely.    Portion Distortion:  -Group instruction provided by PowerPoint slides, verbal discussion, written materials, and food models to support subject matter. The instructor gives an explanation of serving size versus portion size, changes in portions sizes over the last 20 years, and what consists of a serving from each food group.   Stress Management:  -Group instruction provided by verbal instruction, video, and written materials to support subject matter.  Instructors review role of stress in heart disease and how to cope with stress positively.     Exercising on Your Own:  -Group instruction provided by verbal instruction, power point, and written materials to support subject.  Instructors discuss benefits of exercise, components of exercise, frequency and intensity of exercise, and end points for exercise.  Also discuss use of nitroglycerin and activating EMS.  Review options of places to exercise outside of rehab.  Review guidelines for sex with heart disease.   Cardiac Drugs I:  -Group instruction provided by verbal instruction and written materials to support subject.  Instructor reviews cardiac drug classes: antiplatelets, anticoagulants, beta blockers, and statins.  Instructor discusses reasons, side effects, and lifestyle considerations for each drug class.   Cardiac Drugs II:  -Group instruction provided by verbal instruction and written materials to support subject.  Instructor reviews cardiac drug classes: angiotensin converting enzyme inhibitors (ACE-I), angiotensin II receptor blockers (ARBs), nitrates, and calcium channel blockers.  Instructor discusses reasons, side effects, and lifestyle considerations for each drug class.   Anatomy and Physiology of the Circulatory System:  Group verbal and  written instruction and models provide basic cardiac anatomy and physiology, with the coronary electrical and arterial systems. Review of: AMI, Angina, Valve disease, Heart Failure, Peripheral Artery Disease, Cardiac Arrhythmia, Pacemakers, and the ICD.   Other Education:  -Group or individual verbal, written, or video instructions that support the educational goals of the cardiac rehab program.   Holiday Eating Survival Tips:  -Group instruction provided by PowerPoint slides, verbal discussion, and written materials to support subject matter. The instructor gives patients  tips, tricks, and techniques to help them not only survive but enjoy the holidays despite the onslaught of food that accompanies the holidays.   Knowledge Questionnaire Score: Knowledge Questionnaire Score - 05/28/17 1113      Knowledge Questionnaire Score   Pre Score  22/24       Core Components/Risk Factors/Patient Goals at Admission: Personal Goals and Risk Factors at Admission - 05/28/17 1105      Core Components/Risk Factors/Patient Goals on Admission    Weight Management  Yes;Obesity    Intervention  Weight Management/Obesity: Establish reasonable short term and long term weight goals.;Obesity: Provide education and appropriate resources to help participant work on and attain dietary goals.    Admit Weight  215 lb 2.7 oz (97.6 kg)    Goal Weight: Short Term  209 lb 3.2 oz (94.9 kg)    Goal Weight: Long Term  195 lb 3.2 oz (88.5 kg)    Expected Outcomes  Short Term: Continue to assess and modify interventions until short term weight is achieved;Long Term: Adherence to nutrition and physical activity/exercise program aimed toward attainment of established weight goal;Weight Loss: Understanding of general recommendations for a balanced deficit meal plan, which promotes 1-2 lb weight loss per week and includes a negative energy balance of [phone] kcal/d;Understanding of distribution of calorie intake throughout the  day with the consumption of 4-5 meals/snacks;Understanding recommendations for meals to include 15-35% energy as protein, 25-35% energy from fat, 35-60% energy from carbohydrates, less than 200mg  of dietary cholesterol, 20-35 gm of total fiber daily    Hypertension  Yes    Intervention  Provide education on lifestyle modifcations including regular physical activity/exercise, weight management, moderate sodium restriction and increased consumption of fresh fruit, vegetables, and low fat dairy, alcohol moderation, and smoking cessation.;Monitor prescription use compliance.    Expected Outcomes  Short Term: Continued assessment and intervention until BP is < 140/5mm HG in hypertensive participants. < 130/27mm HG in hypertensive participants with diabetes, heart failure or chronic kidney disease.;Long Term: Maintenance of blood pressure at goal levels.    Lipids  Yes    Intervention  Provide education and support for participant on nutrition & aerobic/resistive exercise along with prescribed medications to achieve LDL 70mg , HDL >40mg .    Expected Outcomes  Short Term: Participant states understanding of desired cholesterol values and is compliant with medications prescribed. Participant is following exercise prescription and nutrition guidelines.;Long Term: Cholesterol controlled with medications as prescribed, with individualized exercise RX and with personalized nutrition plan. Value goals: LDL < 70mg , HDL > 40 mg.       Core Components/Risk Factors/Patient Goals Review:    Core Components/Risk Factors/Patient Goals at Discharge (Final Review):    ITP Comments: ITP Comments    Row Name 05/28/17 0923           ITP Comments  Medical Director, Dr. Fransico Him, MD          Comments: Giulian attended orientation from [phone] to 1008 to review rules and guidelines for program. Completed 6 minute walk test, Intitial ITP, and exercise prescription.  VSS. Telemetry-Sinus Rhythm with arrythmia. [place]  has a history of PVC's Asymptomatic.Barnet Pall, RN,BSN 05/28/2017 12:08 PM

## 2017-05-28 NOTE — Progress Notes (Signed)
Quindon Denker 72 y.o. male DOB: [birth year] MRN: [id number]      Nutrition Note  Dx: DES LAD Past Medical History:  Diagnosis Date  . Coronary artery disease   . GERD (gastroesophageal reflux disease)   . Hyperlipidemia   . Hypertension   . NSTEMI (non-ST elevated myocardial infarction) ([place]) 06/2007   [name] 07/27/2010  . OSA on CPAP   . Palpitations   . Pneumonia ~ 1971 X 1  . Type II diabetes mellitus ([place])    Meds reviewed. Metformin/Januvia noted  HT: Ht Readings from Last 1 Encounters:  04/24/17 5\' 11"  (1.803 m)    WT: Wt Readings from Last 3 Encounters:  04/24/17 216 lb (98 kg)  04/12/17 210 lb 12.2 oz (95.6 kg)  04/05/17 211 lb 6.4 oz (95.9 kg)     BMI 30.14   Current tobacco use? No   Labs:  Lipid Panel     Component Value Date/Time   CHOL 113 06/21/2016 0836   TRIG 151 (H) 06/21/2016 0836   HDL 35 (L) 06/21/2016 0836   CHOLHDL 3.2 06/21/2016 0836   VLDL 30 06/21/2016 0836   LDLCALC 48 06/21/2016 0836   CBG (last 3)  No results for input(s): GLUCAP in the last 72 hours. CBG   122      04/12/17 Nutrition Note Spoke with pt. Pt known to me from previous admission. Nutrition plan and goals reviewed with pt. Pt is following Step 1  of the Therapeutic Lifestyle Changes diet. Pt is obese but does not want to lose wt at this time. Pt states "I'd be happy if wt loss happened by just participating in rehab though." Pt is diabetic. No recent A1c noted. Per pt, his A1c has run from 6.3-6.8 consistently. Pt checks his CBG's "sporadically." Per discussion, Dr. Chalmers Cater manages pt's DM. Pt expressed understanding of the information reviewed. Pt aware of nutrition education classes offered and states he has previously attended nutrition classes offered.  Nutrition Diagnosis ? Food-and nutrition-related knowledge deficit related to lack of exposure to information as related to diagnosis of: ? CVD ? DM ? Obesity related to excessive energy intake as evidenced by a BMI of  30.1  Nutrition Intervention ? Pt's individual nutrition plan and goals reviewed with pt.  Nutrition Goal(s):  ? Pt to identify and limit food sources of saturated fat, trans fat, and sodium  Plan:  Pt to attend nutrition classes ? Portion Distortion ? Diabetes Q & A Will provide client-centered nutrition education as part of interdisciplinary care.   Monitor and evaluate progress toward nutrition goal with team.  Derek Mound, M.Ed, RD, LDN, CDE 05/28/2017 9:02 AM

## 2017-05-31 ENCOUNTER — Telehealth: Payer: Self-pay | Admitting: *Deleted

## 2017-05-31 MED ORDER — GABAPENTIN 300 MG PO CAPS: 300.0000 mg | ORAL_CAPSULE | Freq: Three times a day (TID) | ORAL | 3 refills | Status: DC

## 2017-05-31 NOTE — Telephone Encounter (Signed)
Refill request for gabapentin 300mg  #[id number] one tid +3 refills. Dr. Paulla Dolly states order as previously.

## 2017-06-05 ENCOUNTER — Encounter (HOSPITAL_COMMUNITY): Payer: Medicare Other

## 2017-06-05 ENCOUNTER — Encounter (HOSPITAL_COMMUNITY): Payer: Self-pay

## 2017-06-07 ENCOUNTER — Encounter (HOSPITAL_COMMUNITY): Payer: Self-pay

## 2017-06-07 ENCOUNTER — Encounter (HOSPITAL_COMMUNITY): Payer: Medicare Other

## 2017-06-07 ENCOUNTER — Encounter (HOSPITAL_COMMUNITY)
Admission: RE | Admit: 2017-06-07 | Discharge: 2017-06-07 | Disposition: A | Discharge status: Home / Self Care | Payer: Medicare Other | Source: Ambulatory Visit | Attending: Cardiovascular Disease | Admitting: Cardiovascular Disease

## 2017-06-07 DIAGNOSIS — E785 Hyperlipidemia, unspecified: Secondary | ICD-10-CM | POA: Diagnosis not present

## 2017-06-07 DIAGNOSIS — G4733 Obstructive sleep apnea (adult) (pediatric): Secondary | ICD-10-CM | POA: Diagnosis not present

## 2017-06-07 DIAGNOSIS — I214 Non-ST elevation (NSTEMI) myocardial infarction: Secondary | ICD-10-CM | POA: Diagnosis not present

## 2017-06-07 DIAGNOSIS — I10 Essential (primary) hypertension: Secondary | ICD-10-CM | POA: Diagnosis not present

## 2017-06-07 DIAGNOSIS — Z955 Presence of coronary angioplasty implant and graft: Secondary | ICD-10-CM

## 2017-06-07 DIAGNOSIS — E119 Type 2 diabetes mellitus without complications: Secondary | ICD-10-CM | POA: Diagnosis not present

## 2017-06-07 LAB — GLUCOSE, CAPILLARY
GLUCOSE-CAPILLARY: 155 mg/dL — AB (ref 65–99)
Glucose-Capillary: 188 mg/dL — ABNORMAL HIGH (ref 65–99)

## 2017-06-10 ENCOUNTER — Encounter (HOSPITAL_COMMUNITY)
Admission: RE | Admit: 2017-06-10 | Discharge: 2017-06-10 | Disposition: A | Discharge status: Home / Self Care | Payer: Medicare Other | Source: Ambulatory Visit | Attending: Cardiovascular Disease | Admitting: Cardiovascular Disease

## 2017-06-10 ENCOUNTER — Encounter (HOSPITAL_COMMUNITY): Payer: Self-pay

## 2017-06-10 ENCOUNTER — Encounter (HOSPITAL_COMMUNITY): Payer: Medicare Other

## 2017-06-10 DIAGNOSIS — E785 Hyperlipidemia, unspecified: Secondary | ICD-10-CM | POA: Diagnosis not present

## 2017-06-10 DIAGNOSIS — E119 Type 2 diabetes mellitus without complications: Secondary | ICD-10-CM | POA: Diagnosis not present

## 2017-06-10 DIAGNOSIS — Z955 Presence of coronary angioplasty implant and graft: Secondary | ICD-10-CM | POA: Diagnosis not present

## 2017-06-10 DIAGNOSIS — I214 Non-ST elevation (NSTEMI) myocardial infarction: Secondary | ICD-10-CM | POA: Diagnosis not present

## 2017-06-10 DIAGNOSIS — G4733 Obstructive sleep apnea (adult) (pediatric): Secondary | ICD-10-CM | POA: Diagnosis not present

## 2017-06-10 DIAGNOSIS — I10 Essential (primary) hypertension: Secondary | ICD-10-CM | POA: Diagnosis not present

## 2017-06-10 LAB — GLUCOSE, CAPILLARY: GLUCOSE-CAPILLARY: 179 mg/dL — AB (ref 65–99)

## 2017-06-11 ENCOUNTER — Telehealth: Payer: Self-pay | Admitting: *Deleted

## 2017-06-11 DIAGNOSIS — Z7984 Long term (current) use of oral hypoglycemic drugs: Secondary | ICD-10-CM | POA: Diagnosis not present

## 2017-06-11 DIAGNOSIS — J209 Acute bronchitis, unspecified: Secondary | ICD-10-CM | POA: Diagnosis not present

## 2017-06-11 MED ORDER — GABAPENTIN 300 MG PO CAPS: 300.0000 mg | ORAL_CAPSULE | Freq: Three times a day (TID) | ORAL | 3 refills | Status: DC

## 2017-06-11 NOTE — Telephone Encounter (Signed)
[id number] request 90 day supply Gabapentin 300mg . Dr. Josephina Shih order +3 additional.

## 2017-06-12 ENCOUNTER — Encounter (HOSPITAL_COMMUNITY): Payer: Medicare Other

## 2017-06-12 ENCOUNTER — Encounter (HOSPITAL_COMMUNITY)
Admission: RE | Admit: 2017-06-12 | Discharge: 2017-06-12 | Disposition: A | Discharge status: Home / Self Care | Payer: Medicare Other | Source: Ambulatory Visit

## 2017-06-12 DIAGNOSIS — E119 Type 2 diabetes mellitus without complications: Secondary | ICD-10-CM | POA: Diagnosis not present

## 2017-06-12 DIAGNOSIS — Z955 Presence of coronary angioplasty implant and graft: Secondary | ICD-10-CM | POA: Diagnosis not present

## 2017-06-12 DIAGNOSIS — E785 Hyperlipidemia, unspecified: Secondary | ICD-10-CM | POA: Diagnosis not present

## 2017-06-12 DIAGNOSIS — I214 Non-ST elevation (NSTEMI) myocardial infarction: Secondary | ICD-10-CM | POA: Diagnosis not present

## 2017-06-12 DIAGNOSIS — I10 Essential (primary) hypertension: Secondary | ICD-10-CM | POA: Diagnosis not present

## 2017-06-12 DIAGNOSIS — G4733 Obstructive sleep apnea (adult) (pediatric): Secondary | ICD-10-CM | POA: Diagnosis not present

## 2017-06-12 LAB — GLUCOSE, CAPILLARY: GLUCOSE-CAPILLARY: 185 mg/dL — AB (ref 65–99)

## 2017-06-12 NOTE — Progress Notes (Signed)
Cardiac Individual Treatment Plan  Patient Details  Name: Justin Carr MRN: [id number] Date of Birth: 06-Nov-1945 Referring Provider:     CARDIAC REHAB PHASE II ORIENTATION from 05/28/2017 in [place]  Referring Provider  Quay Burow, MD.      Initial Encounter Date:    CARDIAC REHAB PHASE II ORIENTATION from 05/28/2017 in [place]  Date  05/28/17  Referring Provider  Quay Burow, MD.      Visit Diagnosis: Status post coronary artery stent placement 04/11/17 [facility]  Patient's Home Medications on Admission:  Current Outpatient Medications:  .  ALPHA-LIPOIC ACID PO, Take 1 tablet by mouth daily., Disp: , Rfl:  .  aspirin 81 MG tablet, Take 1 tablet (81 mg total) by mouth daily., Disp: 90 tablet, Rfl: 3 .  clopidogrel (PLAVIX) 75 MG tablet, Take 1 tablet (75 mg total) by mouth daily., Disp: 90 tablet, Rfl: 3 .  Coenzyme Q10 (CO Q 10 PO), Take 1 capsule by mouth daily. , Disp: , Rfl:  .  Cyanocobalamin (B-12) 2500 MCG TABS, Take 2,500 mcg by mouth daily. , Disp: , Rfl:  .  gabapentin (NEURONTIN) 300 MG capsule, Take 1 capsule (300 mg total) by mouth 3 (three) times daily., Disp: 270 capsule, Rfl: 3 .  metFORMIN (GLUCOPHAGE) 1000 MG tablet, Take 1,000 mg by mouth 2 (two) times daily with a meal., Disp: , Rfl:  .  Multiple Vitamin (MULTIVITAMIN WITH MINERALS) TABS tablet, Take 1 tablet by mouth daily., Disp: , Rfl:  .  Multiple Vitamins-Minerals (PRESERVISION AREDS PO), Take 1 capsule by mouth daily. , Disp: , Rfl:  .  nitroGLYCERIN (NITROSTAT) 0.4 MG SL tablet, Place 1 tablet (0.4 mg total) under the tongue every 5 (five) minutes as needed for chest pain., Disp: 25 tablet, Rfl: 0 .  ramipril (ALTACE) 2.5 MG capsule, Take 1 capsule (2.5 mg total) by mouth daily., Disp: 90 capsule, Rfl: 3 .  ranitidine (ZANTAC) 150 MG tablet, Take 1 tablet (150 mg total) by mouth 2 (two) times daily., Disp: 180 tablet, Rfl: 3 .   rosuvastatin (CRESTOR) 20 MG tablet, Take 1 tablet (20 mg total) by mouth 3 (three) times a week., Disp: 36 tablet, Rfl: 3 .  sildenafil (REVATIO) 20 MG tablet, Take 20-100 mg by mouth as needed (for erectile disfunction). , Disp: , Rfl:  .  sitaGLIPtin (JANUVIA) 50 MG tablet, Take 50 mg by mouth daily., Disp: , Rfl:   Past Medical History: Past Medical History:  Diagnosis Date  . Coronary artery disease   . GERD (gastroesophageal reflux disease)   . Hyperlipidemia   . Hypertension   . NSTEMI (non-ST elevated myocardial infarction) ([place]) 06/2007   Justin Carr 07/27/2010  . OSA on CPAP   . Palpitations   . Pneumonia ~ 1971 X 1  . Type II diabetes mellitus ([facility])     Tobacco Use: Social History   Tobacco Use  Smoking Status Former Smoker  . Packs/day: 1.00  . Years: 30.00  . Pack years: 30.00  . Types: Cigarettes  . Last attempt to quit: 07/03/2006  . Years since quitting: 10.9  Smokeless Tobacco Never Used    Labs: Recent Review Scientist, physiological    Labs for ITP Cardiac and Pulmonary Rehab Latest Ref Rng & Units 07/02/2007 07/03/2007 06/21/2016   Cholestrol <200 mg/dL - 199 ATP III CLASSIFICATION: <200     mg/dL   Desirable 200-239  mg/dL   Borderline High >=240  mg/dL   High 113   LDLCALC <100 mg/dL - 139 Total Cholesterol/HDL:CHD Risk Coronary Heart Disease Risk Table Men   Women 1/2 Average Risk   3.4   3.3(H) 48   HDL >40 mg/dL - 30(L) 35(L)   Trlycerides <150 mg/dL - 150(H) 151(H)   Hemoglobin A1c - - 6.2 (NOTE)   The ADA recommends the following therapeutic goals for glycemic   control related to Hgb A1C measurement:   Goal of Therapy:   < 7.0% Hgb A1C   Action Suggested:  > 8.0% Hgb A1C   Ref:  Diabetes Care, 22, Suppl. 1, 1999(H) -   TCO2 - 29 - -      Capillary Blood Glucose: Lab Results  Component Value Date   GLUCAP 185 (H) 06/12/2017   GLUCAP 179 (H) 06/10/2017   GLUCAP 155 (H) 06/07/2017   GLUCAP 188 (H) 06/07/2017   GLUCAP 122 (H) 04/12/2017     Exercise  Target Goals:    Exercise Program Goal: Individual exercise prescription set using results from initial 6 min walk test and THRR while considering  patient's activity barriers and safety.   Exercise Prescription Goal: Initial exercise prescription builds to 30-45 minutes a day of aerobic activity, 2-3 days per week.  Home exercise guidelines will be given to patient during program as part of exercise prescription that the participant will acknowledge.  Activity Barriers & Risk Stratification: Activity Barriers & Cardiac Risk Stratification - 05/28/17 1107      Activity Barriers & Cardiac Risk Stratification   Activity Barriers  Other (comment)    Comments  Right lateral hip pain.    Cardiac Risk Stratification  Moderate       6 Minute Walk: 6 Minute Walk    Row Name 05/28/17 0954         6 Minute Walk   Phase  Initial     Distance  1643 feet     Walk Time  6 minutes     # of Rest Breaks  0     MPH  3.11     METS  3.33     RPE  11     VO2 Peak  11.66     Symptoms  No     Resting HR  73 bpm     Resting BP  118/60     Resting Oxygen Saturation   98 %     Exercise Oxygen Saturation  during 6 min walk  98 %     Max Ex. HR  94 bpm     Max Ex. BP  142/64     2 Minute Post BP  128/64        Oxygen Initial Assessment:   Oxygen Re-Evaluation:   Oxygen Discharge (Final Oxygen Re-Evaluation):   Initial Exercise Prescription: Initial Exercise Prescription - 05/28/17 1100      Date of Initial Exercise RX and Referring Provider   Date  05/28/17    Referring Provider  Quay Burow, MD.      Treadmill   MPH  2.6    Grade  1    Minutes  10    METs  3.35      Bike   Level  1.2    Minutes  10    METs  3.32      NuStep   Level  3    SPM  85    Minutes  10    METs  3  Prescription Details   Frequency (times per week)  3    Duration  Progress to 30 minutes of continuous aerobic without signs/symptoms of physical distress      Intensity   THRR 40-80%  of Max Heartrate  60-119    Ratings of Perceived Exertion  11-13    Perceived Dyspnea  0-4      Progression   Progression  Continue to progress workloads to maintain intensity without signs/symptoms of physical distress.      Resistance Training   Training Prescription  Yes    Weight  4lbs    Reps  10-15       Perform Capillary Blood Glucose checks as needed.  Exercise Prescription Changes: Exercise Prescription Changes    Row Name 06/07/17 1200             Response to Exercise   Blood Pressure (Admit)  114/60       Blood Pressure (Exercise)  146/62       Blood Pressure (Exit)  132/70       Heart Rate (Admit)  84 bpm       Heart Rate (Exercise)  115 bpm       Heart Rate (Exit)  76 bpm       Rating of Perceived Exertion (Exercise)  11       Symptoms  none       Comments  pt oriented to exercise equipment today       Duration  Continue with 30 min of aerobic exercise without signs/symptoms of physical distress.       Intensity  THRR unchanged         Progression   Progression  Continue to progress workloads to maintain intensity without signs/symptoms of physical distress.       Average METs  3         Resistance Training   Training Prescription  Yes       Weight  4lbs       Reps  10-15       Time  10 Minutes         Treadmill   MPH  2.6       Grade  1       Minutes  10       METs  3.35         Bike   Level  1.2       Minutes  10       METs  3.32         NuStep   Level  3       SPM  85       Minutes  10       METs  2.4          Exercise Comments: Exercise Comments    Row Name 06/07/17 1208           Exercise Comments  Pt responded to exercise session very well today. Pt was also oriented to exercise equipment and exercisee for 30 minutes safely           Exercise Goals and Review: Exercise Goals    Row Name 05/28/17 0929             Exercise Goals   Increase Physical Activity  Yes       Intervention  Provide advice, education,  support and counseling about physical activity/exercise needs.;Develop an individualized exercise prescription for aerobic and resistive training based on  initial evaluation findings, risk stratification, comorbidities and participant's personal goals.       Expected Outcomes  Short Term: Attend rehab on a regular basis to increase amount of physical activity.;Long Term: Exercising regularly at least 3-5 days a week.;Long Term: Add in home exercise to make exercise part of routine and to increase amount of physical activity.       Increase Strength and Stamina  Yes       Intervention  Provide advice, education, support and counseling about physical activity/exercise needs.;Develop an individualized exercise prescription for aerobic and resistive training based on initial evaluation findings, risk stratification, comorbidities and participant's personal goals.       Expected Outcomes  Short Term: Increase workloads from initial exercise prescription for resistance, speed, and METs.;Short Term: Perform resistance training exercises routinely during rehab and add in resistance training at home;Long Term: Improve cardiorespiratory fitness, muscular endurance and strength as measured by increased METs and functional capacity (6MWT)       Able to understand and use rate of perceived exertion (RPE) scale  Yes       Intervention  Provide education and explanation on how to use RPE scale       Expected Outcomes  Short Term: Able to use RPE daily in rehab to express subjective intensity level;Long Term:  Able to use RPE to guide intensity level when exercising independently       Knowledge and understanding of Target Heart Rate Range (THRR)  Yes       Intervention  Provide education and explanation of THRR including how the numbers were predicted and where they are located for reference       Expected Outcomes  Short Term: Able to state/look up THRR;Long Term: Able to use THRR to govern intensity when exercising  independently;Short Term: Able to use daily as guideline for intensity in rehab       Able to check pulse independently  Yes       Intervention  Provide education and demonstration on how to check pulse in carotid and radial arteries.;Review the importance of being able to check your own pulse for safety during independent exercise       Expected Outcomes  Short Term: Able to explain why pulse checking is important during independent exercise;Long Term: Able to check pulse independently and accurately       Understanding of Exercise Prescription  Yes       Intervention  Provide education, explanation, and written materials on patient's individual exercise prescription       Expected Outcomes  Short Term: Able to explain program exercise prescription;Long Term: Able to explain home exercise prescription to exercise independently          Exercise Goals Re-Evaluation : Exercise Goals Re-Evaluation    Row Name 06/10/17 1044             Exercise Goal Re-Evaluation   Exercise Goals Review  Increase Physical Activity;Able to understand and use rate of perceived exertion (RPE) scale;Understanding of Exercise Prescription       Comments  Pt completed 2 sessions of cardiac rehab thus far. Pt is very fluent with exercise program and demonstrates proper use of RPE scale.       Expected Outcomes  Pt will continue to exercise for 30 minutes without signs/symptoms of physical distress.           Discharge Exercise Prescription (Final Exercise Prescription Changes): Exercise Prescription Changes - 06/07/17 1200  Response to Exercise   Blood Pressure (Admit)  114/60    Blood Pressure (Exercise)  146/62    Blood Pressure (Exit)  132/70    Heart Rate (Admit)  84 bpm    Heart Rate (Exercise)  115 bpm    Heart Rate (Exit)  76 bpm    Rating of Perceived Exertion (Exercise)  11    Symptoms  none    Comments  pt oriented to exercise equipment today    Duration  Continue with 30 min of aerobic  exercise without signs/symptoms of physical distress.    Intensity  THRR unchanged      Progression   Progression  Continue to progress workloads to maintain intensity without signs/symptoms of physical distress.    Average METs  3      Resistance Training   Training Prescription  Yes    Weight  4lbs    Reps  10-15    Time  10 Minutes      Treadmill   MPH  2.6    Grade  1    Minutes  10    METs  3.35      Bike   Level  1.2    Minutes  10    METs  3.32      NuStep   Level  3    SPM  85    Minutes  10    METs  2.4       Nutrition:  Target Goals: Understanding of nutrition guidelines, daily intake of sodium 1500mg , cholesterol 200mg , calories 30% from fat and 7% or less from saturated fats, daily to have 5 or more servings of fruits and vegetables.  Biometrics: Pre Biometrics - 05/28/17 1112      Pre Biometrics   Height  5' 10.5" (1.791 m)    Weight  215 lb 2.7 oz (97.6 kg)    Waist Circumference  42.25 inches    Hip Circumference  44 inches    Waist to Hip Ratio  0.96 %    BMI (Calculated)  30.43    Triceps Skinfold  20 mm    % Body Fat  30.6 %    Grip Strength  47 kg    Flexibility  9 in    Single Leg Stand  30 seconds        Nutrition Therapy Plan and Nutrition Goals: Nutrition Therapy & Goals - 05/28/17 0930      Nutrition Therapy   Diet  Carb Modified, Heart Healthy      Personal Nutrition Goals   Nutrition Goal  Pt to identify and limit food sources of saturated fat, trans fat, and sodium      Intervention Plan   Intervention  Prescribe, educate and counsel regarding individualized specific dietary modifications aiming towards targeted core components such as weight, hypertension, lipid management, diabetes, heart failure and other comorbidities.    Expected Outcomes  Short Term Goal: Understand basic principles of dietary content, such as calories, fat, sodium, cholesterol and nutrients.;Long Term Goal: Adherence to prescribed nutrition plan.        Nutrition Assessments: Nutrition Assessments - 05/28/17 0930      MEDFICTS Scores   Pre Score  66       Nutrition Goals Re-Evaluation:   Nutrition Goals Re-Evaluation:   Nutrition Goals Discharge (Final Nutrition Goals Re-Evaluation):   Psychosocial: Target Goals: Acknowledge presence or absence of significant depression and/or stress, maximize coping skills, provide positive support system. Participant is able  to verbalize types and ability to use techniques and skills needed for reducing stress and depression.  Initial Review & Psychosocial Screening: Initial Psych Review & Screening - 05/28/17 1200      Initial Review   Current issues with  None Identified      Family Dynamics   Good Support System?  Yes Tarl has his wife for support      Barriers   Psychosocial barriers to participate in program  There are no identifiable barriers or psychosocial needs.      Screening Interventions   Interventions  Encouraged to exercise       Quality of Life Scores: Quality of Life - 06/12/17 1156      Quality of Life Scores   Health/Function Pre  27.9 %    Socioeconomic Pre  27.43 %    Psych/Spiritual Pre  30 %    Family Pre  27.6 %    GLOBAL Pre  28.19 % QOL scores reviewed with pt. overall pt displays positive outlook with good coping skills. pt offered emotional support and reassurance.        Scores of 19 and below usually indicate a poorer quality of life in these areas.  A difference of  2-3 points is a clinically meaningful difference.  A difference of 2-3 points in the total score of the Quality of Life Index has been associated with significant improvement in overall quality of life, self-image, physical symptoms, and general health in studies assessing change in quality of life.  PHQ-9: Recent Review Flowsheet Data    Depression screen [facility] 2/9 06/07/2017   Decreased Interest 0   Down, Depressed, Hopeless 0   PHQ - 2 Score 0     Interpretation of Total  Score  Total Score Depression Severity:  1-4 = Minimal depression, 5-9 = Mild depression, 10-14 = Moderate depression, 15-19 = Moderately severe depression, 20-27 = Severe depression   Psychosocial Evaluation and Intervention: Psychosocial Evaluation - 06/10/17 1529      Psychosocial Evaluation & Interventions   Interventions  Encouraged to exercise with the program and follow exercise prescription    Comments  no psychosocial needs identified, no interventions necessary     Expected Outcomes  pt will exhibit positive outlook with good coping skills.     Continue Psychosocial Services   No Follow up required       Psychosocial Re-Evaluation: Psychosocial Re-Evaluation    [place] Name 06/12/17 1638             Psychosocial Re-Evaluation   Current issues with  None Identified       Comments  no psychosocial needs identified, no interventions necessary        Expected Outcomes  pt will exhibit positive outlooks with good coping skils.        Interventions  Encouraged to attend Cardiac Rehabilitation for the exercise       Continue Psychosocial Services   No Follow up required          Psychosocial Discharge (Final Psychosocial Re-Evaluation): Psychosocial Re-Evaluation - 06/12/17 1638      Psychosocial Re-Evaluation   Current issues with  None Identified    Comments  no psychosocial needs identified, no interventions necessary     Expected Outcomes  pt will exhibit positive outlooks with good coping skils.     Interventions  Encouraged to attend Cardiac Rehabilitation for the exercise    Continue Psychosocial Services   No Follow up required  Vocational Rehabilitation: Provide vocational rehab assistance to qualifying candidates.   Vocational Rehab Evaluation & Intervention: Vocational Rehab - 05/28/17 1200      Initial Vocational Rehab Evaluation & Intervention   Assessment shows need for Vocational Rehabilitation  No Rasul is retired and does not need vocational  rehab at this time       Education: Education Goals: Education classes will be provided on a weekly basis, covering required topics. Participant will state understanding/return demonstration of topics presented.  Learning Barriers/Preferences: Learning Barriers/Preferences - 05/28/17 9381      Learning Barriers/Preferences   Learning Barriers  Sight    Learning Preferences  Video;Pictoral       Education Topics: Count Your Pulse:  -Group instruction provided by verbal instruction, demonstration, patient participation and written materials to support subject.  Instructors address importance of being able to find your pulse and how to count your pulse when at home without a heart monitor.  Patients get hands on experience counting their pulse with staff help and individually.   Heart Attack, Angina, and Risk Factor Modification:  -Group instruction provided by verbal instruction, video, and written materials to support subject.  Instructors address signs and symptoms of angina and heart attacks.    Also discuss risk factors for heart disease and how to make changes to improve heart health risk factors.   Functional Fitness:  -Group instruction provided by verbal instruction, demonstration, patient participation, and written materials to support subject.  Instructors address safety measures for doing things around the house.  Discuss how to get up and down off the floor, how to pick things up properly, how to safely get out of a chair without assistance, and balance training.   CARDIAC REHAB PHASE II EXERCISE from 06/12/2017 in [place]  Date  05/31/17  Educator  EP  Instruction Review Code  2- Demonstrated Understanding      Meditation and Mindfulness:  -Group instruction provided by verbal instruction, patient participation, and written materials to support subject.  Instructor addresses importance of mindfulness and meditation practice to help  reduce stress and improve awareness.  Instructor also leads participants through a meditation exercise.    CARDIAC REHAB PHASE II EXERCISE from 06/12/2017 in [place]  Date  06/12/17  Instruction Review Code  2- Demonstrated Understanding      Stretching for Flexibility and Mobility:  -Group instruction provided by verbal instruction, patient participation, and written materials to support subject.  Instructors lead participants through series of stretches that are designed to increase flexibility thus improving mobility.  These stretches are additional exercise for major muscle groups that are typically performed during regular warm up and cool down.   Hands Only CPR:  -Group verbal, video, and participation provides a basic overview of AHA guidelines for community CPR. Role-play of emergencies allow participants the opportunity to practice calling for help and chest compression technique with discussion of AED use.   Hypertension: -Group verbal and written instruction that provides a basic overview of hypertension including the most recent diagnostic guidelines, risk factor reduction with self-care instructions and medication management.    Nutrition I class: Heart Healthy Eating:  -Group instruction provided by PowerPoint slides, verbal discussion, and written materials to support subject matter. The instructor gives an explanation and review of the Therapeutic Lifestyle Changes diet recommendations, which includes a discussion on lipid goals, dietary fat, sodium, fiber, plant stanol/sterol esters, sugar, and the components of a well-balanced, healthy diet.  Nutrition II class: Lifestyle Skills:  -Group instruction provided by PowerPoint slides, verbal discussion, and written materials to support subject matter. The instructor gives an explanation and review of label reading, grocery shopping for heart health, heart healthy recipe modifications, and ways  to make healthier choices when eating out.   Diabetes Question & Answer:  -Group instruction provided by PowerPoint slides, verbal discussion, and written materials to support subject matter. The instructor gives an explanation and review of diabetes co-morbidities, pre- and post-prandial blood glucose goals, pre-exercise blood glucose goals, signs, symptoms, and treatment of hypoglycemia and hyperglycemia, and foot care basics.   Diabetes Blitz:  -Group instruction provided by PowerPoint slides, verbal discussion, and written materials to support subject matter. The instructor gives an explanation and review of the physiology behind type 1 and type 2 diabetes, diabetes medications and rational behind using different medications, pre- and post-prandial blood glucose recommendations and Hemoglobin A1c goals, diabetes diet, and exercise including blood glucose guidelines for exercising safely.    Portion Distortion:  -Group instruction provided by PowerPoint slides, verbal discussion, written materials, and food models to support subject matter. The instructor gives an explanation of serving size versus portion size, changes in portions sizes over the last 20 years, and what consists of a serving from each food group.   Stress Management:  -Group instruction provided by verbal instruction, video, and written materials to support subject matter.  Instructors review role of stress in heart disease and how to cope with stress positively.     Exercising on Your Own:  -Group instruction provided by verbal instruction, power point, and written materials to support subject.  Instructors discuss benefits of exercise, components of exercise, frequency and intensity of exercise, and end points for exercise.  Also discuss use of nitroglycerin and activating EMS.  Review options of places to exercise outside of rehab.  Review guidelines for sex with heart disease.   Cardiac Drugs I:  -Group instruction  provided by verbal instruction and written materials to support subject.  Instructor reviews cardiac drug classes: antiplatelets, anticoagulants, beta blockers, and statins.  Instructor discusses reasons, side effects, and lifestyle considerations for each drug class.   Cardiac Drugs II:  -Group instruction provided by verbal instruction and written materials to support subject.  Instructor reviews cardiac drug classes: angiotensin converting enzyme inhibitors (ACE-I), angiotensin II receptor blockers (ARBs), nitrates, and calcium channel blockers.  Instructor discusses reasons, side effects, and lifestyle considerations for each drug class.   Anatomy and Physiology of the Circulatory System:  Group verbal and written instruction and models provide basic cardiac anatomy and physiology, with the coronary electrical and arterial systems. Review of: AMI, Angina, Valve disease, Heart Failure, Peripheral Artery Disease, Cardiac Arrhythmia, Pacemakers, and the ICD.   Other Education:  -Group or individual verbal, written, or video instructions that support the educational goals of the cardiac rehab program.   Holiday Eating Survival Tips:  -Group instruction provided by PowerPoint slides, verbal discussion, and written materials to support subject matter. The instructor gives patients tips, tricks, and techniques to help them not only survive but enjoy the holidays despite the onslaught of food that accompanies the holidays.   Knowledge Questionnaire Score: Knowledge Questionnaire Score - 05/28/17 1113      Knowledge Questionnaire Score   Pre Score  22/24       Core Components/Risk Factors/Patient Goals at Admission: Personal Goals and Risk Factors at Admission - 05/28/17 1105      Core Components/Risk Factors/Patient Goals on Admission  Weight Management  Yes;Obesity    Intervention  Weight Management/Obesity: Establish reasonable short term and long term weight goals.;Obesity: Provide  education and appropriate resources to help participant work on and attain dietary goals.    Admit Weight  215 lb 2.7 oz (97.6 kg)    Goal Weight: Short Term  209 lb 3.2 oz (94.9 kg)    Goal Weight: Long Term  195 lb 3.2 oz (88.5 kg)    Expected Outcomes  Short Term: Continue to assess and modify interventions until short term weight is achieved;Long Term: Adherence to nutrition and physical activity/exercise program aimed toward attainment of established weight goal;Weight Loss: Understanding of general recommendations for a balanced deficit meal plan, which promotes 1-2 lb weight loss per week and includes a negative energy balance of [phone] kcal/d;Understanding of distribution of calorie intake throughout the day with the consumption of 4-5 meals/snacks;Understanding recommendations for meals to include 15-35% energy as protein, 25-35% energy from fat, 35-60% energy from carbohydrates, less than 200mg  of dietary cholesterol, 20-35 gm of total fiber daily    Hypertension  Yes    Intervention  Provide education on lifestyle modifcations including regular physical activity/exercise, weight management, moderate sodium restriction and increased consumption of fresh fruit, vegetables, and low fat dairy, alcohol moderation, and smoking cessation.;Monitor prescription use compliance.    Expected Outcomes  Short Term: Continued assessment and intervention until BP is < 140/85mm HG in hypertensive participants. < 130/25mm HG in hypertensive participants with diabetes, heart failure or chronic kidney disease.;Long Term: Maintenance of blood pressure at goal levels.    Lipids  Yes    Intervention  Provide education and support for participant on nutrition & aerobic/resistive exercise along with prescribed medications to achieve LDL 70mg , HDL >40mg .    Expected Outcomes  Short Term: Participant states understanding of desired cholesterol values and is compliant with medications prescribed. Participant is  following exercise prescription and nutrition guidelines.;Long Term: Cholesterol controlled with medications as prescribed, with individualized exercise RX and with personalized nutrition plan. Value goals: LDL < 70mg , HDL > 40 mg.       Core Components/Risk Factors/Patient Goals Review:  Goals and Risk Factor Review    Row Name 06/07/17 1114 06/10/17 1529           Core Components/Risk Factors/Patient Goals Review   Personal Goals Review  Weight Management/Obesity;Hypertension;Lipids;Diabetes  Weight Management/Obesity;Hypertension;Lipids;Diabetes      Review  pt with multiple CAD RF demonstrates eagerness to participate in CR program.  pt completed program 10 years ago and is interested in re-establishing home exercise program to reduce disease progression.  pt with multiple CAD RF demonstrates eagerness to participate in CR program.  pt completed program 10 years ago and is interested in re-establishing home exercise program to reduce disease progression.      Expected Outcomes  pt will participate in CR exercise, nutrition and lifestyle modification opportunties to decrease overall RF.    pt will participate in CR exercise, nutrition and lifestyle modification opportunties to decrease overall RF.           Core Components/Risk Factors/Patient Goals at Discharge (Final Review):  Goals and Risk Factor Review - 06/10/17 1529      Core Components/Risk Factors/Patient Goals Review   Personal Goals Review  Weight Management/Obesity;Hypertension;Lipids;Diabetes    Review  pt with multiple CAD RF demonstrates eagerness to participate in CR program.  pt completed program 10 years ago and is interested in re-establishing home exercise program to reduce disease progression.  Expected Outcomes  pt will participate in CR exercise, nutrition and lifestyle modification opportunties to decrease overall RF.         ITP Comments: ITP Comments    Row Name 05/28/17 9458 06/07/17 1111 06/10/17 1529        ITP Comments  Medical Director, Dr. Fransico Him, MD  pt started group exercise sessions. pt tolerated light activity without difficulty. pt demonstrates eagerness to participate in CR program  30 day ITP.  pt with good attendance and participation.         Comments:

## 2017-06-13 ENCOUNTER — Telehealth: Payer: Self-pay | Admitting: *Deleted

## 2017-06-13 NOTE — Telephone Encounter (Signed)
Pt states he will receive 20% off of the medication Dr. Paulla Dolly ordered for his neuropathy.

## 2017-06-14 ENCOUNTER — Encounter (HOSPITAL_COMMUNITY)
Admission: RE | Admit: 2017-06-14 | Discharge: 2017-06-14 | Disposition: A | Discharge status: Home / Self Care | Payer: Medicare Other | Source: Ambulatory Visit | Attending: Cardiovascular Disease | Admitting: Cardiovascular Disease

## 2017-06-14 ENCOUNTER — Encounter (HOSPITAL_COMMUNITY): Payer: Medicare Other

## 2017-06-14 DIAGNOSIS — I10 Essential (primary) hypertension: Secondary | ICD-10-CM | POA: Diagnosis not present

## 2017-06-14 DIAGNOSIS — Z955 Presence of coronary angioplasty implant and graft: Secondary | ICD-10-CM

## 2017-06-14 DIAGNOSIS — E119 Type 2 diabetes mellitus without complications: Secondary | ICD-10-CM | POA: Diagnosis not present

## 2017-06-14 DIAGNOSIS — I214 Non-ST elevation (NSTEMI) myocardial infarction: Secondary | ICD-10-CM | POA: Diagnosis not present

## 2017-06-14 DIAGNOSIS — G4733 Obstructive sleep apnea (adult) (pediatric): Secondary | ICD-10-CM | POA: Diagnosis not present

## 2017-06-14 DIAGNOSIS — E785 Hyperlipidemia, unspecified: Secondary | ICD-10-CM | POA: Diagnosis not present

## 2017-06-14 LAB — GLUCOSE, CAPILLARY: Glucose-Capillary: 218 mg/dL — ABNORMAL HIGH (ref 65–99)

## 2017-06-17 ENCOUNTER — Encounter (HOSPITAL_COMMUNITY)
Admission: RE | Admit: 2017-06-17 | Discharge: 2017-06-17 | Disposition: A | Discharge status: Home / Self Care | Payer: Medicare Other | Source: Ambulatory Visit | Attending: Cardiovascular Disease | Admitting: Cardiovascular Disease

## 2017-06-17 ENCOUNTER — Encounter (HOSPITAL_COMMUNITY): Payer: Medicare Other

## 2017-06-17 DIAGNOSIS — E785 Hyperlipidemia, unspecified: Secondary | ICD-10-CM | POA: Diagnosis not present

## 2017-06-17 DIAGNOSIS — Z955 Presence of coronary angioplasty implant and graft: Secondary | ICD-10-CM | POA: Diagnosis not present

## 2017-06-17 DIAGNOSIS — E119 Type 2 diabetes mellitus without complications: Secondary | ICD-10-CM | POA: Diagnosis not present

## 2017-06-17 DIAGNOSIS — I10 Essential (primary) hypertension: Secondary | ICD-10-CM | POA: Diagnosis not present

## 2017-06-17 DIAGNOSIS — G4733 Obstructive sleep apnea (adult) (pediatric): Secondary | ICD-10-CM | POA: Diagnosis not present

## 2017-06-17 DIAGNOSIS — I214 Non-ST elevation (NSTEMI) myocardial infarction: Secondary | ICD-10-CM | POA: Diagnosis not present

## 2017-06-17 LAB — GLUCOSE, CAPILLARY: GLUCOSE-CAPILLARY: 207 mg/dL — AB (ref 65–99)

## 2017-06-17 NOTE — Progress Notes (Signed)
Justin Carr 72 y.o. male DOB: 01/29/1946 MRN: [id number]      Nutrition Note  Dx: DES LAD  Meds reviewed. Metformin/Januvia noted  Nutrition Note Spoke with pt. Nutrition plan and survey reviewed with pt. Pt is following Step 1  of the Therapeutic Lifestyle Changes diet. Pt is diabetic. No recent A1c noted. Per pt, his A1c has consistently been 6.3-6.8. Pt reports he sees Dr. Chalmers Cater yearly due to DM well controlled. Pt expressed understanding of the information reviewed. Pt aware of nutrition education classes offered.  Nutrition Diagnosis ? Food-and nutrition-related knowledge deficit related to lack of exposure to information as related to diagnosis of: ? CVD ? DM ? Obesity related to excessive energy intake as evidenced by a BMI of 30.1  Nutrition Intervention ? Pt's individual nutrition plan reviewed with pt. ? Benefits of adopting Heart Healthy diet discussed when [employer] reviewed.    Nutrition Goal(s):  ? Pt to identify and limit food sources of saturated fat, trans fat, and sodium  Plan:  Pt to attend nutrition classes ? Portion Distortion ? Diabetes Q & A Will provide client-centered nutrition education as part of interdisciplinary care.   Monitor and evaluate progress toward nutrition goal with team.  Derek Mound, M.Ed, RD, LDN, CDE 06/17/2017 9:12 AM

## 2017-06-17 NOTE — Progress Notes (Signed)
Reviewed home exercise with pt today.  Pt plans to walk for exercise, 3x/week in addition to coming to cardiac rehab.  Reviewed THR, pulse, RPE, sign and symptoms, NTG use, and when to call 911 or MD.  Also discussed weather considerations and indoor options.  Pt voiced understanding.    Justin Carr [employer]

## 2017-06-19 ENCOUNTER — Encounter (HOSPITAL_COMMUNITY): Payer: Medicare Other

## 2017-06-19 ENCOUNTER — Encounter (HOSPITAL_COMMUNITY)
Admission: RE | Admit: 2017-06-19 | Discharge: 2017-06-19 | Disposition: A | Discharge status: Home / Self Care | Payer: Medicare Other | Source: Ambulatory Visit | Attending: Cardiovascular Disease | Admitting: Cardiovascular Disease

## 2017-06-19 DIAGNOSIS — E119 Type 2 diabetes mellitus without complications: Secondary | ICD-10-CM | POA: Diagnosis not present

## 2017-06-19 DIAGNOSIS — G4733 Obstructive sleep apnea (adult) (pediatric): Secondary | ICD-10-CM | POA: Diagnosis not present

## 2017-06-19 DIAGNOSIS — Z955 Presence of coronary angioplasty implant and graft: Secondary | ICD-10-CM | POA: Diagnosis not present

## 2017-06-19 DIAGNOSIS — I214 Non-ST elevation (NSTEMI) myocardial infarction: Secondary | ICD-10-CM | POA: Diagnosis not present

## 2017-06-19 DIAGNOSIS — I10 Essential (primary) hypertension: Secondary | ICD-10-CM | POA: Diagnosis not present

## 2017-06-19 DIAGNOSIS — E785 Hyperlipidemia, unspecified: Secondary | ICD-10-CM | POA: Diagnosis not present

## 2017-06-19 LAB — GLUCOSE, CAPILLARY: Glucose-Capillary: 229 mg/dL — ABNORMAL HIGH (ref 65–99)

## 2017-06-21 ENCOUNTER — Encounter (HOSPITAL_COMMUNITY)
Admission: RE | Admit: 2017-06-21 | Discharge: 2017-06-21 | Disposition: A | Discharge status: Home / Self Care | Payer: Medicare Other | Source: Ambulatory Visit | Attending: Cardiovascular Disease | Admitting: Cardiovascular Disease

## 2017-06-21 ENCOUNTER — Encounter (HOSPITAL_COMMUNITY): Payer: Medicare Other

## 2017-06-21 DIAGNOSIS — I214 Non-ST elevation (NSTEMI) myocardial infarction: Secondary | ICD-10-CM | POA: Diagnosis not present

## 2017-06-21 DIAGNOSIS — G4733 Obstructive sleep apnea (adult) (pediatric): Secondary | ICD-10-CM | POA: Diagnosis not present

## 2017-06-21 DIAGNOSIS — Z955 Presence of coronary angioplasty implant and graft: Secondary | ICD-10-CM | POA: Diagnosis not present

## 2017-06-21 DIAGNOSIS — E785 Hyperlipidemia, unspecified: Secondary | ICD-10-CM | POA: Diagnosis not present

## 2017-06-21 DIAGNOSIS — I10 Essential (primary) hypertension: Secondary | ICD-10-CM | POA: Diagnosis not present

## 2017-06-21 DIAGNOSIS — E119 Type 2 diabetes mellitus without complications: Secondary | ICD-10-CM | POA: Diagnosis not present

## 2017-06-21 LAB — GLUCOSE, CAPILLARY: Glucose-Capillary: 204 mg/dL — ABNORMAL HIGH (ref 65–99)

## 2017-06-24 ENCOUNTER — Encounter (HOSPITAL_COMMUNITY)
Admission: RE | Admit: 2017-06-24 | Discharge: 2017-06-24 | Disposition: A | Discharge status: Home / Self Care | Payer: Medicare Other | Source: Ambulatory Visit | Attending: Cardiovascular Disease | Admitting: Cardiovascular Disease

## 2017-06-24 ENCOUNTER — Encounter (HOSPITAL_COMMUNITY): Payer: Medicare Other

## 2017-06-24 DIAGNOSIS — Z7982 Long term (current) use of aspirin: Secondary | ICD-10-CM | POA: Insufficient documentation

## 2017-06-24 DIAGNOSIS — Z955 Presence of coronary angioplasty implant and graft: Secondary | ICD-10-CM | POA: Insufficient documentation

## 2017-06-24 DIAGNOSIS — E119 Type 2 diabetes mellitus without complications: Secondary | ICD-10-CM | POA: Insufficient documentation

## 2017-06-24 DIAGNOSIS — Z87891 Personal history of nicotine dependence: Secondary | ICD-10-CM | POA: Diagnosis not present

## 2017-06-24 DIAGNOSIS — E785 Hyperlipidemia, unspecified: Secondary | ICD-10-CM | POA: Insufficient documentation

## 2017-06-24 DIAGNOSIS — I214 Non-ST elevation (NSTEMI) myocardial infarction: Secondary | ICD-10-CM | POA: Diagnosis not present

## 2017-06-24 DIAGNOSIS — G4733 Obstructive sleep apnea (adult) (pediatric): Secondary | ICD-10-CM | POA: Insufficient documentation

## 2017-06-24 DIAGNOSIS — Z79899 Other long term (current) drug therapy: Secondary | ICD-10-CM | POA: Insufficient documentation

## 2017-06-24 DIAGNOSIS — I10 Essential (primary) hypertension: Secondary | ICD-10-CM | POA: Insufficient documentation

## 2017-06-24 LAB — GLUCOSE, CAPILLARY: Glucose-Capillary: 150 mg/dL — ABNORMAL HIGH (ref 65–99)

## 2017-06-26 ENCOUNTER — Encounter (HOSPITAL_COMMUNITY): Payer: Medicare Other

## 2017-06-26 ENCOUNTER — Encounter (HOSPITAL_COMMUNITY)
Admission: RE | Admit: 2017-06-26 | Discharge: 2017-06-26 | Disposition: A | Discharge status: Home / Self Care | Payer: Medicare Other | Source: Ambulatory Visit | Attending: Cardiovascular Disease | Admitting: Cardiovascular Disease

## 2017-06-26 DIAGNOSIS — Z955 Presence of coronary angioplasty implant and graft: Secondary | ICD-10-CM

## 2017-06-26 DIAGNOSIS — G4733 Obstructive sleep apnea (adult) (pediatric): Secondary | ICD-10-CM | POA: Diagnosis not present

## 2017-06-26 DIAGNOSIS — I214 Non-ST elevation (NSTEMI) myocardial infarction: Secondary | ICD-10-CM | POA: Diagnosis not present

## 2017-06-26 DIAGNOSIS — E785 Hyperlipidemia, unspecified: Secondary | ICD-10-CM | POA: Diagnosis not present

## 2017-06-26 DIAGNOSIS — I10 Essential (primary) hypertension: Secondary | ICD-10-CM | POA: Diagnosis not present

## 2017-06-26 DIAGNOSIS — E119 Type 2 diabetes mellitus without complications: Secondary | ICD-10-CM | POA: Diagnosis not present

## 2017-06-26 NOTE — Progress Notes (Addendum)
Daily Session Note  Patient Details  Name: Justin Carr MRN: [id number] Date of Birth: 01/03/1946 Referring Provider:   Flowsheet Row CARDIAC REHAB PHASE II ORIENTATION from 05/28/2017 in [place]  Referring Provider  Quay Burow, MD.      Encounter Date: 06/26/2017  Check In: Session Check In - 06/26/17 0815    Check-In          Location  [facility]    Staff Present  Dorma Russell, MS,ACSM CEP, Exercise Physiologist;Amber Fair, MS, ACSM RCEP, Exercise Physiologist;Joann Rion, RN, BSN;Other    Supervising physician immediately available to respond to emergencies  [facility] immediately available    Physician(s)  Dr. Lonny Prude    Medication changes reported      No    Fall or balance concerns reported     No    Tobacco Cessation  No Change    Warm-up and Cool-down  Performed as group-led instruction    Resistance Training Performed  No    VAD Patient?  No        Pain Assessment          Currently in Pain?  No/denies           Capillary Blood Glucose: No results found for this or any previous visit (from the past 24 hour(s)).    Social History   Tobacco Use  Smoking Status Former Smoker  . Packs/day: 1.00  . Years: 30.00  . Pack years: 30.00  . Types: Cigarettes  . Last attempt to quit: 07/03/2006  . Years since quitting: 10.9  Smokeless Tobacco Never Used    Goals Met:  Exercise tolerated well  Goals Unmet:  Not Applicable  Comments: pt demonstrates stable CBG readings with exercise.  Pt does not regularly check home CBG, is followed yearly by Dr. Chalmers Cater with A1c in 6.3-6.8.   Will continue to spot check CBG PRN at rehab.  Andi Hence, RN, BSN Cardiac Pulmonary Rehab 06/26/17 10:23 AM   Dr. Fransico Him is Medical Director for Cardiac Rehab at [facility].

## 2017-06-28 ENCOUNTER — Encounter (HOSPITAL_COMMUNITY)
Admission: RE | Admit: 2017-06-28 | Discharge: 2017-06-28 | Disposition: A | Discharge status: Home / Self Care | Payer: Medicare Other | Source: Ambulatory Visit | Attending: Cardiovascular Disease | Admitting: Cardiovascular Disease

## 2017-06-28 ENCOUNTER — Encounter (HOSPITAL_COMMUNITY): Payer: Medicare Other

## 2017-06-28 DIAGNOSIS — G4733 Obstructive sleep apnea (adult) (pediatric): Secondary | ICD-10-CM | POA: Diagnosis not present

## 2017-06-28 DIAGNOSIS — Z955 Presence of coronary angioplasty implant and graft: Secondary | ICD-10-CM

## 2017-06-28 DIAGNOSIS — E119 Type 2 diabetes mellitus without complications: Secondary | ICD-10-CM | POA: Diagnosis not present

## 2017-06-28 DIAGNOSIS — I214 Non-ST elevation (NSTEMI) myocardial infarction: Secondary | ICD-10-CM | POA: Diagnosis not present

## 2017-06-28 DIAGNOSIS — E785 Hyperlipidemia, unspecified: Secondary | ICD-10-CM | POA: Diagnosis not present

## 2017-06-28 DIAGNOSIS — I10 Essential (primary) hypertension: Secondary | ICD-10-CM | POA: Diagnosis not present

## 2017-07-01 ENCOUNTER — Encounter (HOSPITAL_COMMUNITY)
Admission: RE | Admit: 2017-07-01 | Discharge: 2017-07-01 | Disposition: A | Discharge status: Home / Self Care | Payer: Medicare Other | Source: Ambulatory Visit | Attending: Cardiovascular Disease | Admitting: Cardiovascular Disease

## 2017-07-01 ENCOUNTER — Telehealth (HOSPITAL_COMMUNITY): Payer: Self-pay | Admitting: Cardiac Rehabilitation

## 2017-07-01 ENCOUNTER — Encounter (HOSPITAL_COMMUNITY): Payer: Medicare Other

## 2017-07-01 DIAGNOSIS — I214 Non-ST elevation (NSTEMI) myocardial infarction: Secondary | ICD-10-CM | POA: Diagnosis not present

## 2017-07-01 DIAGNOSIS — E119 Type 2 diabetes mellitus without complications: Secondary | ICD-10-CM | POA: Diagnosis not present

## 2017-07-01 DIAGNOSIS — Z955 Presence of coronary angioplasty implant and graft: Secondary | ICD-10-CM | POA: Diagnosis not present

## 2017-07-01 DIAGNOSIS — G4733 Obstructive sleep apnea (adult) (pediatric): Secondary | ICD-10-CM | POA: Diagnosis not present

## 2017-07-01 DIAGNOSIS — I10 Essential (primary) hypertension: Secondary | ICD-10-CM | POA: Diagnosis not present

## 2017-07-01 DIAGNOSIS — E785 Hyperlipidemia, unspecified: Secondary | ICD-10-CM | POA: Diagnosis not present

## 2017-07-01 NOTE — Telephone Encounter (Signed)
-----   Message from [name], MD sent at 06/28/2017  1:21 PM EDT ----- Regarding: RE: cardiac rehab  I do not have the expertise to comment on this issue. He should ask PCP. ----- Message ----- From: Lowell Guitar, RN Sent: 06/28/2017   9:19 AM To: [name], MD Subject: cardiac rehab                                  Dear Dr. [name],  Pt is interested in using CBD oil (2 drops sublingual) for peripheral neuropathy.  Is this advisable for him?  Thank you, Andi Hence, RN, BSN Cardiac Pulmonary Rehab

## 2017-07-03 ENCOUNTER — Encounter (HOSPITAL_COMMUNITY)
Admission: RE | Admit: 2017-07-03 | Discharge: 2017-07-03 | Disposition: A | Discharge status: Home / Self Care | Payer: Medicare Other | Source: Ambulatory Visit | Attending: Cardiovascular Disease | Admitting: Cardiovascular Disease

## 2017-07-03 ENCOUNTER — Encounter (HOSPITAL_COMMUNITY): Payer: Medicare Other

## 2017-07-03 DIAGNOSIS — G4733 Obstructive sleep apnea (adult) (pediatric): Secondary | ICD-10-CM | POA: Diagnosis not present

## 2017-07-03 DIAGNOSIS — E119 Type 2 diabetes mellitus without complications: Secondary | ICD-10-CM | POA: Diagnosis not present

## 2017-07-03 DIAGNOSIS — E785 Hyperlipidemia, unspecified: Secondary | ICD-10-CM | POA: Diagnosis not present

## 2017-07-03 DIAGNOSIS — I10 Essential (primary) hypertension: Secondary | ICD-10-CM | POA: Diagnosis not present

## 2017-07-03 DIAGNOSIS — Z955 Presence of coronary angioplasty implant and graft: Secondary | ICD-10-CM | POA: Diagnosis not present

## 2017-07-03 DIAGNOSIS — I214 Non-ST elevation (NSTEMI) myocardial infarction: Secondary | ICD-10-CM | POA: Diagnosis not present

## 2017-07-05 ENCOUNTER — Encounter (HOSPITAL_COMMUNITY)
Admission: RE | Admit: 2017-07-05 | Discharge: 2017-07-05 | Disposition: A | Discharge status: Home / Self Care | Payer: Medicare Other | Source: Ambulatory Visit | Attending: Cardiovascular Disease | Admitting: Cardiovascular Disease

## 2017-07-05 ENCOUNTER — Encounter (HOSPITAL_COMMUNITY): Payer: Medicare Other

## 2017-07-05 DIAGNOSIS — E785 Hyperlipidemia, unspecified: Secondary | ICD-10-CM | POA: Diagnosis not present

## 2017-07-05 DIAGNOSIS — Z955 Presence of coronary angioplasty implant and graft: Secondary | ICD-10-CM

## 2017-07-05 DIAGNOSIS — G4733 Obstructive sleep apnea (adult) (pediatric): Secondary | ICD-10-CM | POA: Diagnosis not present

## 2017-07-05 DIAGNOSIS — E119 Type 2 diabetes mellitus without complications: Secondary | ICD-10-CM | POA: Diagnosis not present

## 2017-07-05 DIAGNOSIS — I214 Non-ST elevation (NSTEMI) myocardial infarction: Secondary | ICD-10-CM | POA: Diagnosis not present

## 2017-07-05 DIAGNOSIS — I10 Essential (primary) hypertension: Secondary | ICD-10-CM | POA: Diagnosis not present

## 2017-07-08 ENCOUNTER — Encounter (HOSPITAL_COMMUNITY): Payer: Medicare Other

## 2017-07-08 ENCOUNTER — Encounter (HOSPITAL_COMMUNITY)
Admission: RE | Admit: 2017-07-08 | Discharge: 2017-07-08 | Disposition: A | Discharge status: Home / Self Care | Payer: Medicare Other | Source: Ambulatory Visit | Attending: Cardiovascular Disease | Admitting: Cardiovascular Disease

## 2017-07-08 DIAGNOSIS — Z955 Presence of coronary angioplasty implant and graft: Secondary | ICD-10-CM | POA: Diagnosis not present

## 2017-07-08 DIAGNOSIS — E785 Hyperlipidemia, unspecified: Secondary | ICD-10-CM | POA: Diagnosis not present

## 2017-07-08 DIAGNOSIS — G4733 Obstructive sleep apnea (adult) (pediatric): Secondary | ICD-10-CM | POA: Diagnosis not present

## 2017-07-08 DIAGNOSIS — E119 Type 2 diabetes mellitus without complications: Secondary | ICD-10-CM | POA: Diagnosis not present

## 2017-07-08 DIAGNOSIS — I214 Non-ST elevation (NSTEMI) myocardial infarction: Secondary | ICD-10-CM | POA: Diagnosis not present

## 2017-07-08 DIAGNOSIS — I10 Essential (primary) hypertension: Secondary | ICD-10-CM | POA: Diagnosis not present

## 2017-07-10 ENCOUNTER — Encounter (HOSPITAL_COMMUNITY)
Admission: RE | Admit: 2017-07-10 | Discharge: 2017-07-10 | Disposition: A | Discharge status: Home / Self Care | Payer: Medicare Other | Source: Ambulatory Visit | Attending: Cardiovascular Disease | Admitting: Cardiovascular Disease

## 2017-07-10 ENCOUNTER — Encounter (HOSPITAL_COMMUNITY): Payer: Medicare Other

## 2017-07-10 ENCOUNTER — Encounter (HOSPITAL_COMMUNITY): Payer: Self-pay

## 2017-07-10 DIAGNOSIS — I10 Essential (primary) hypertension: Secondary | ICD-10-CM | POA: Diagnosis not present

## 2017-07-10 DIAGNOSIS — Z955 Presence of coronary angioplasty implant and graft: Secondary | ICD-10-CM | POA: Diagnosis not present

## 2017-07-10 DIAGNOSIS — E785 Hyperlipidemia, unspecified: Secondary | ICD-10-CM | POA: Diagnosis not present

## 2017-07-10 DIAGNOSIS — G4733 Obstructive sleep apnea (adult) (pediatric): Secondary | ICD-10-CM | POA: Diagnosis not present

## 2017-07-10 DIAGNOSIS — I214 Non-ST elevation (NSTEMI) myocardial infarction: Secondary | ICD-10-CM | POA: Diagnosis not present

## 2017-07-10 DIAGNOSIS — E119 Type 2 diabetes mellitus without complications: Secondary | ICD-10-CM | POA: Diagnosis not present

## 2017-07-11 NOTE — Progress Notes (Signed)
Cardiac Individual Treatment Plan  Patient Details  Name: Cloyce Blankenhorn MRN: [id number] Date of Birth: [birth year] Referring Provider:   Flowsheet Row CARDIAC REHAB PHASE II ORIENTATION from 05/28/2017 in [place]  Referring Provider  Quay Burow, MD.      Initial Encounter Date:  Flowsheet Row CARDIAC REHAB PHASE II ORIENTATION from 05/28/2017 in [place]  Date  05/28/17  Referring Provider  Quay Burow, MD.      Visit Diagnosis: Status post coronary artery stent placement 04/11/17 [facility]  Patient's Home Medications on Admission:  Current Outpatient Medications:  .  ALPHA-LIPOIC ACID PO, Take 1 tablet by mouth daily., Disp: , Rfl:  .  aspirin 81 MG tablet, Take 1 tablet (81 mg total) by mouth daily., Disp: 90 tablet, Rfl: 3 .  clopidogrel (PLAVIX) 75 MG tablet, Take 1 tablet (75 mg total) by mouth daily., Disp: 90 tablet, Rfl: 3 .  Coenzyme Q10 (CO Q 10 PO), Take 1 capsule by mouth daily. , Disp: , Rfl:  .  Cyanocobalamin (B-12) 2500 MCG TABS, Take 2,500 mcg by mouth daily. , Disp: , Rfl:  .  gabapentin (NEURONTIN) 300 MG capsule, Take 1 capsule (300 mg total) by mouth 3 (three) times daily., Disp: 270 capsule, Rfl: 3 .  metFORMIN (GLUCOPHAGE) 1000 MG tablet, Take 1,000 mg by mouth 2 (two) times daily with a meal., Disp: , Rfl:  .  Multiple Vitamin (MULTIVITAMIN WITH MINERALS) TABS tablet, Take 1 tablet by mouth daily., Disp: , Rfl:  .  Multiple Vitamins-Minerals (PRESERVISION AREDS PO), Take 1 capsule by mouth daily. , Disp: , Rfl:  .  nitroGLYCERIN (NITROSTAT) 0.4 MG SL tablet, Place 1 tablet (0.4 mg total) under the tongue every 5 (five) minutes as needed for chest pain., Disp: 25 tablet, Rfl: 0 .  ramipril (ALTACE) 2.5 MG capsule, Take 1 capsule (2.5 mg total) by mouth daily., Disp: 90 capsule, Rfl: 3 .  ranitidine (ZANTAC) 150 MG tablet, Take 1 tablet (150 mg total) by mouth 2 (two) times daily., Disp: 180  tablet, Rfl: 3 .  rosuvastatin (CRESTOR) 20 MG tablet, Take 1 tablet (20 mg total) by mouth 3 (three) times a week., Disp: 36 tablet, Rfl: 3 .  sildenafil (REVATIO) 20 MG tablet, Take 20-100 mg by mouth as needed (for erectile disfunction). , Disp: , Rfl:  .  sitaGLIPtin (JANUVIA) 50 MG tablet, Take 50 mg by mouth daily., Disp: , Rfl:   Past Medical History: Past Medical History:  Diagnosis Date  . Coronary artery disease   . GERD (gastroesophageal reflux disease)   . Hyperlipidemia   . Hypertension   . NSTEMI (non-ST elevated myocardial infarction) ([place]) 06/2007   [name] 07/27/2010  . OSA on CPAP   . Palpitations   . Pneumonia ~ 1971 X 1  . Type II diabetes mellitus ([facility])     Tobacco Use: Social History   Tobacco Use  Smoking Status Former Smoker  . Packs/day: 1.00  . Years: 30.00  . Pack years: 30.00  . Types: Cigarettes  . Last attempt to quit: 07/03/2006  . Years since quitting: 11.0  Smokeless Tobacco Never Used    Labs: Recent Review Scientist, physiological    Labs for ITP Cardiac and Pulmonary Rehab Latest Ref Rng & Units 07/02/2007 07/03/2007 06/21/2016   Cholestrol <200 mg/dL - 199 ATP III CLASSIFICATION: <200     mg/dL   Desirable 200-239  mg/dL   Borderline High >=240  mg/dL   High 113   LDLCALC <100 mg/dL - 139 Total Cholesterol/HDL:CHD Risk Coronary Heart Disease Risk Table Men   Women 1/2 Average Risk   3.4   3.3(H) 48   HDL >40 mg/dL - 30(L) 35(L)   Trlycerides <150 mg/dL - 150(H) 151(H)   Hemoglobin A1c - - 6.2 (NOTE)   The ADA recommends the following therapeutic goals for glycemic   control related to Hgb A1C measurement:   Goal of Therapy:   < 7.0% Hgb A1C   Action Suggested:  > 8.0% Hgb A1C   Ref:  Diabetes Care, 22, Suppl. 1, 1999(H) -   TCO2 - 29 - -      Capillary Blood Glucose: Lab Results  Component Value Date   GLUCAP 150 (H) 06/24/2017   GLUCAP 204 (H) 06/21/2017   GLUCAP 229 (H) 06/19/2017   GLUCAP 207 (H) 06/17/2017   GLUCAP 218 (H)  06/14/2017     Exercise Target Goals:    Exercise Program Goal: Individual exercise prescription set using results from initial 6 min walk test and THRR while considering  patient's activity barriers and safety.   Exercise Prescription Goal: Initial exercise prescription builds to 30-45 minutes a day of aerobic activity, 2-3 days per week.  Home exercise guidelines will be given to patient during program as part of exercise prescription that the participant will acknowledge.  Activity Barriers & Risk Stratification: Activity Barriers & Cardiac Risk Stratification - 05/28/17 1107    Activity Barriers & Cardiac Risk Stratification          Activity Barriers  Other (comment)    Comments  Right lateral hip pain.    Cardiac Risk Stratification  Moderate           6 Minute Walk: 6 Minute Walk    6 Minute Walk    Row Name 05/28/17 0954   Phase  Initial   Distance  1643 feet   Walk Time  6 minutes   # of Rest Breaks  0   MPH  3.11   METS  3.33   RPE  11   VO2 Peak  11.66   Symptoms  No   Resting HR  73 bpm   Resting BP  118/60   Resting Oxygen Saturation   98 %   Exercise Oxygen Saturation  during 6 min walk  98 %   Max Ex. HR  94 bpm   Max Ex. BP  142/64   2 Minute Post BP  128/64          Oxygen Initial Assessment:   Oxygen Re-Evaluation:   Oxygen Discharge (Final Oxygen Re-Evaluation):   Initial Exercise Prescription: Initial Exercise Prescription - 05/28/17 1100    Date of Initial Exercise RX and Referring Provider          Date  05/28/17    Referring Provider  Quay Burow, MD.        Treadmill          MPH  2.6    Grade  1    Minutes  10    METs  3.35        Bike          Level  1.2    Minutes  10    METs  3.32        NuStep          Level  3    SPM  85    Minutes  10  METs  3        Prescription Details          Frequency (times per week)  3    Duration  Progress to 30 minutes of continuous aerobic without signs/symptoms  of physical distress        Intensity          THRR 40-80% of Max Heartrate  60-119    Ratings of Perceived Exertion  11-13    Perceived Dyspnea  0-4        Progression          Progression  Continue to progress workloads to maintain intensity without signs/symptoms of physical distress.        Resistance Training          Training Prescription  Yes    Weight  4lbs    Reps  10-15           Perform Capillary Blood Glucose checks as needed.  Exercise Prescription Changes: Exercise Prescription Changes    Response to Exercise    Row Name 06/07/17 1200 06/24/17 0824 07/08/17 1618   Blood Pressure (Admit)  114/60  142/70  124/70   Blood Pressure (Exercise)  146/62  170/70 recheck 148/62  150/72 recheck 148/62   Blood Pressure (Exit)  132/70  112/58  118/60   Heart Rate (Admit)  84 bpm  81 bpm  82 bpm   Heart Rate (Exercise)  115 bpm  125 bpm  126 bpm   Heart Rate (Exit)  76 bpm  84 bpm  82 bpm   Rating of Perceived Exertion (Exercise)  11  11  11    Symptoms  none  none  none   Comments  pt oriented to exercise equipment today  no documentation  no documentation   Duration  Continue with 30 min of aerobic exercise without signs/symptoms of physical distress.  Continue with 30 min of aerobic exercise without signs/symptoms of physical distress.  Continue with 30 min of aerobic exercise without signs/symptoms of physical distress.   Intensity  THRR unchanged  THRR unchanged  THRR unchanged       Progression    Row Name 06/07/17 1200 06/24/17 0824 07/08/17 1618   Progression  Continue to progress workloads to maintain intensity without signs/symptoms of physical distress.  Continue to progress workloads to maintain intensity without signs/symptoms of physical distress.  Continue to progress workloads to maintain intensity without signs/symptoms of physical distress.   Average METs  3  5  4.3       Resistance Training    Row Name 06/07/17 1200 06/24/17 0824 07/08/17 1618    Training Prescription  Yes  Yes  Yes   Weight  4lbs  4lbs  4lbs   Reps  10-15  10-15  10-15   Time  10 Minutes  10 Minutes  10 Minutes       Treadmill    Row Name 06/07/17 1200 06/24/17 0824 07/08/17 1618   MPH  2.6  3.2  3.2   Grade  1  3  3    Minutes  10  10  10    METs  3.35  4.77  4.77       Bike    Row Name 06/07/17 1200 06/24/17 0824 07/08/17 1618   Level  1.2  1.8  1.8   Minutes  10  10  10    METs  3.32  4.55  4.52       NuStep  [place] Name 06/07/17 1200 06/24/17 0824 07/08/17 1618   Level  3  5  5    SPM  85  100  90   Minutes  10  10  10    METs  2.4  5.8  3.6       Home Exercise Plan    [place] Name 06/07/17 1200 06/24/17 0824 07/08/17 1618   Plans to continue exercise at  no documentation  Home (comment) walking  Home (comment) walking   Frequency  no documentation  Add 2 additional days to program exercise sessions.  Add 2 additional days to program exercise sessions.   Initial Home Exercises Provided  no documentation  06/17/17  06/17/17          Exercise Comments: Exercise Comments    Row Name 06/07/17 1208 07/09/17 1353   Exercise Comments  Pt responded to exercise session very well today. Pt was also oriented to exercise equipment and exercisee for 30 minutes safely   Pt responded to exercise session very well today. Pt was also oriented to exercise equipment and exercisee for 30 minutes safely       Exercise Goals and Review: Exercise Goals    Exercise Goals    Row Name 05/28/17 0929   Increase Physical Activity  Yes   Intervention  Provide advice, education, support and counseling about physical activity/exercise needs.;Develop an individualized exercise prescription for aerobic and resistive training based on initial evaluation findings, risk stratification, comorbidities and participant's personal goals.   Expected Outcomes  Short Term: Attend rehab on a regular basis to increase amount of physical activity.;Long Term: Exercising regularly at least 3-5  days a week.;Long Term: Add in home exercise to make exercise part of routine and to increase amount of physical activity.   Increase Strength and Stamina  Yes   Intervention  Provide advice, education, support and counseling about physical activity/exercise needs.;Develop an individualized exercise prescription for aerobic and resistive training based on initial evaluation findings, risk stratification, comorbidities and participant's personal goals.   Expected Outcomes  Short Term: Increase workloads from initial exercise prescription for resistance, speed, and METs.;Short Term: Perform resistance training exercises routinely during rehab and add in resistance training at home;Long Term: Improve cardiorespiratory fitness, muscular endurance and strength as measured by increased METs and functional capacity (6MWT)   Able to understand and use rate of perceived exertion (RPE) scale  Yes   Intervention  Provide education and explanation on how to use RPE scale   Expected Outcomes  Short Term: Able to use RPE daily in rehab to express subjective intensity level;Long Term:  Able to use RPE to guide intensity level when exercising independently   Knowledge and understanding of Target Heart Rate Range (THRR)  Yes   Intervention  Provide education and explanation of THRR including how the numbers were predicted and where they are located for reference   Expected Outcomes  Short Term: Able to state/look up THRR;Long Term: Able to use THRR to govern intensity when exercising independently;Short Term: Able to use daily as guideline for intensity in rehab   Able to check pulse independently  Yes   Intervention  Provide education and demonstration on how to check pulse in carotid and radial arteries.;Review the importance of being able to check your own pulse for safety during independent exercise   Expected Outcomes  Short Term: Able to explain why pulse checking is important during independent exercise;Long Term:  Able to check pulse independently and accurately   Understanding of  Exercise Prescription  Yes   Intervention  Provide education, explanation, and written materials on patient's individual exercise prescription   Expected Outcomes  Short Term: Able to explain program exercise prescription;Long Term: Able to explain home exercise prescription to exercise independently          Exercise Goals Re-Evaluation : Exercise Goals Re-Evaluation    Exercise Goal Re-Evaluation    Row Name 06/10/17 1044 06/17/17 0952 07/09/17 1353   Exercise Goals Review  Increase Physical Activity;Able to understand and use rate of perceived exertion (RPE) scale;Understanding of Exercise Prescription  Knowledge and understanding of Target Heart Rate Range (THRR);Able to understand and use rate of perceived exertion (RPE) scale;Increase Physical Activity;Understanding of Exercise Prescription;Increase Strength and Stamina;Able to check pulse independently  Knowledge and understanding of Target Heart Rate Range (THRR);Able to understand and use rate of perceived exertion (RPE) scale;Increase Physical Activity;Understanding of Exercise Prescription;Increase Strength and Stamina;Able to check pulse independently   Comments  Pt completed 2 sessions of cardiac rehab thus far. Pt is very fluent with exercise program and demonstrates proper use of RPE scale.  Reviewd HEP in which pt plans to walk at home for 30 minutes, 3x/week in addition to coming to cardiac rehab. Pt understands emergency/temperature precautions.   Encourage pt to get back on track with HEP! Pt plays golf 18 holes without difficulty. Reviewed difference between physical activity and exercise. Further discussed activity limitations, hydration and emergency action. Pt voiced understanding.    Expected Outcomes  Pt will continue to exercise for 30 minutes without signs/symptoms of physical distress.  Pt will improve in cardiorespiratory fitness by coming to cardiac rehab  and being compliant with home exercise.   Pt will improve in cardiorespiratory fitness by coming to cardiac rehab and being compliant with home exercise.            Discharge Exercise Prescription (Final Exercise Prescription Changes): Exercise Prescription Changes - 07/08/17 1618    Response to Exercise          Blood Pressure (Admit)  124/70    Blood Pressure (Exercise)  150/72 recheck 148/62    Blood Pressure (Exit)  118/60    Heart Rate (Admit)  82 bpm    Heart Rate (Exercise)  126 bpm    Heart Rate (Exit)  82 bpm    Rating of Perceived Exertion (Exercise)  11    Symptoms  none    Duration  Continue with 30 min of aerobic exercise without signs/symptoms of physical distress.    Intensity  THRR unchanged        Progression          Progression  Continue to progress workloads to maintain intensity without signs/symptoms of physical distress.    Average METs  4.3        Resistance Training          Training Prescription  Yes    Weight  4lbs    Reps  10-15    Time  10 Minutes        Treadmill          MPH  3.2    Grade  3    Minutes  10    METs  4.77        Bike          Level  1.8    Minutes  10    METs  4.52        NuStep  Level  5    SPM  90    Minutes  10    METs  3.6        Home Exercise Plan          Plans to continue exercise at  Home (comment) walking    Frequency  Add 2 additional days to program exercise sessions.    Initial Home Exercises Provided  06/17/17           Nutrition:  Target Goals: Understanding of nutrition guidelines, daily intake of sodium 1500mg , cholesterol 200mg , calories 30% from fat and 7% or less from saturated fats, daily to have 5 or more servings of fruits and vegetables.  Biometrics: Pre Biometrics - 05/28/17 1112    Pre Biometrics          Height  5' 10.5" (1.791 m)    Weight  215 lb 2.7 oz (97.6 kg)    Waist Circumference  42.25 inches    Hip Circumference  44 inches    Waist to Hip Ratio   0.96 %    BMI (Calculated)  30.43    Triceps Skinfold  20 mm    % Body Fat  30.6 %    Grip Strength  47 kg    Flexibility  9 in    Single Leg Stand  30 seconds            Nutrition Therapy Plan and Nutrition Goals: Nutrition Therapy & Goals - 05/28/17 0930    Nutrition Therapy          Diet  Carb Modified, Heart Healthy        Personal Nutrition Goals          Nutrition Goal  Pt to identify and limit food sources of saturated fat, trans fat, and sodium        Intervention Plan          Intervention  Prescribe, educate and counsel regarding individualized specific dietary modifications aiming towards targeted core components such as weight, hypertension, lipid management, diabetes, heart failure and other comorbidities.    Expected Outcomes  Short Term Goal: Understand basic principles of dietary content, such as calories, fat, sodium, cholesterol and nutrients.;Long Term Goal: Adherence to prescribed nutrition plan.           Nutrition Assessments: Nutrition Assessments - 05/28/17 0930    MEDFICTS Scores          Pre Score  66           Nutrition Goals Re-Evaluation:   Nutrition Goals Re-Evaluation:   Nutrition Goals Discharge (Final Nutrition Goals Re-Evaluation):   Psychosocial: Target Goals: Acknowledge presence or absence of significant depression and/or stress, maximize coping skills, provide positive support system. Participant is able to verbalize types and ability to use techniques and skills needed for reducing stress and depression.  Initial Review & Psychosocial Screening: Initial Psych Review & Screening - 05/28/17 1200    Initial Review          Current issues with  None Identified        Family Dynamics          Good Support System?  Yes Karlos has his wife for support        Barriers          Psychosocial barriers to participate in program  There are no identifiable barriers or psychosocial needs.        Screening Interventions  Interventions  Encouraged to exercise           Quality of Life Scores: Quality of Life - 06/12/17 1156    Quality of Life Scores          Health/Function Pre  27.9 %    Socioeconomic Pre  27.43 %    Psych/Spiritual Pre  30 %    Family Pre  27.6 %    GLOBAL Pre  28.19 % QOL scores reviewed with pt. overall pt displays positive outlook with good coping skills. pt offered emotional support and reassurance.            Scores of 19 and below usually indicate a poorer quality of life in these areas.  A difference of  2-3 points is a clinically meaningful difference.  A difference of 2-3 points in the total score of the Quality of Life Index has been associated with significant improvement in overall quality of life, self-image, physical symptoms, and general health in studies assessing change in quality of life.  PHQ-9: Recent Review Flowsheet Data    Depression screen [facility] 2/9 06/07/2017   Decreased Interest 0   Down, Depressed, Hopeless 0   PHQ - 2 Score 0     Interpretation of Total Score  Total Score Depression Severity:  1-4 = Minimal depression, 5-9 = Mild depression, 10-14 = Moderate depression, 15-19 = Moderately severe depression, 20-27 = Severe depression   Psychosocial Evaluation and Intervention: Psychosocial Evaluation - 06/10/17 1529    Psychosocial Evaluation & Interventions          Interventions  Encouraged to exercise with the program and follow exercise prescription    Comments  no psychosocial needs identified, no interventions necessary     Expected Outcomes  pt will exhibit positive outlook with good coping skills.     Continue Psychosocial Services   No Follow up required           Psychosocial Re-Evaluation: Psychosocial Re-Evaluation    Psychosocial Re-Evaluation    Row Name 06/12/17 1638 07/10/17 1200   Current issues with  None Identified  None Identified   Comments  no psychosocial needs identified, no interventions necessary   no  psychosocial needs identified, no interventions necessary    Expected Outcomes  pt will exhibit positive outlooks with good coping skils.   pt will exhibit positive outlooks with good coping skils.    Interventions  Encouraged to attend Cardiac Rehabilitation for the exercise  Encouraged to attend Cardiac Rehabilitation for the exercise   Continue Psychosocial Services   No Follow up required  No Follow up required          Psychosocial Discharge (Final Psychosocial Re-Evaluation): Psychosocial Re-Evaluation - 07/10/17 1200    Psychosocial Re-Evaluation          Current issues with  None Identified    Comments  no psychosocial needs identified, no interventions necessary     Expected Outcomes  pt will exhibit positive outlooks with good coping skils.     Interventions  Encouraged to attend Cardiac Rehabilitation for the exercise    Continue Psychosocial Services   No Follow up required           Vocational Rehabilitation: Provide vocational rehab assistance to qualifying candidates.   Vocational Rehab Evaluation & Intervention: Vocational Rehab - 05/28/17 1200    Initial Vocational Rehab Evaluation & Intervention          Assessment shows need for Vocational Rehabilitation  No Sigurd is  retired and does not need vocational rehab at this time           Education: Education Goals: Education classes will be provided on a weekly basis, covering required topics. Participant will state understanding/return demonstration of topics presented.  Learning Barriers/Preferences: Learning Barriers/Preferences - 05/28/17 1610    Learning Barriers/Preferences          Learning Barriers  Sight    Learning Preferences  Video;Pictoral           Education Topics: Count Your Pulse:  -Group instruction provided by verbal instruction, demonstration, patient participation and written materials to support subject.  Instructors address importance of being able to find your pulse and how to  count your pulse when at home without a heart monitor.  Patients get hands on experience counting their pulse with staff help and individually. Flowsheet Row CARDIAC REHAB PHASE II EXERCISE from 07/10/2017 in [place]  Date  07/05/17  Educator  RN  Instruction Review Code  2- Demonstrated Understanding      Heart Attack, Angina, and Risk Factor Modification:  -Group instruction provided by verbal instruction, video, and written materials to support subject.  Instructors address signs and symptoms of angina and heart attacks.    Also discuss risk factors for heart disease and how to make changes to improve heart health risk factors.   Functional Fitness:  -Group instruction provided by verbal instruction, demonstration, patient participation, and written materials to support subject.  Instructors address safety measures for doing things around the house.  Discuss how to get up and down off the floor, how to pick things up properly, how to safely get out of a chair without assistance, and balance training. Flowsheet Row CARDIAC REHAB PHASE II EXERCISE from 07/10/2017 in [place]  Date  05/31/17  Educator  EP  Instruction Review Code  2- Demonstrated Understanding      Meditation and Mindfulness:  -Group instruction provided by verbal instruction, patient participation, and written materials to support subject.  Instructor addresses importance of mindfulness and meditation practice to help reduce stress and improve awareness.  Instructor also leads participants through a meditation exercise.  Flowsheet Row CARDIAC REHAB PHASE II EXERCISE from 07/10/2017 in [place]  Date  06/12/17  Instruction Review Code  2- Demonstrated Understanding      Stretching for Flexibility and Mobility:  -Group instruction provided by verbal instruction, patient participation, and written materials to support  subject.  Instructors lead participants through series of stretches that are designed to increase flexibility thus improving mobility.  These stretches are additional exercise for major muscle groups that are typically performed during regular warm up and cool down.   Hands Only CPR:  -Group verbal, video, and participation provides a basic overview of AHA guidelines for community CPR. Role-play of emergencies allow participants the opportunity to practice calling for help and chest compression technique with discussion of AED use.   Hypertension: -Group verbal and written instruction that provides a basic overview of hypertension including the most recent diagnostic guidelines, risk factor reduction with self-care instructions and medication management. Flowsheet Row CARDIAC REHAB PHASE II EXERCISE from 07/10/2017 in [place]  Date  06/21/17  Educator  RN  Instruction Review Code  2- Demonstrated Understanding       Nutrition I class: Heart Healthy Eating:  -Group instruction provided by PowerPoint slides, verbal discussion, and written materials to support  subject matter. The instructor gives an explanation and review of the Therapeutic Lifestyle Changes diet recommendations, which includes a discussion on lipid goals, dietary fat, sodium, fiber, plant stanol/sterol esters, sugar, and the components of a well-balanced, healthy diet.   Nutrition II class: Lifestyle Skills:  -Group instruction provided by PowerPoint slides, verbal discussion, and written materials to support subject matter. The instructor gives an explanation and review of label reading, grocery shopping for heart health, heart healthy recipe modifications, and ways to make healthier choices when eating out.   Diabetes Question & Answer:  -Group instruction provided by PowerPoint slides, verbal discussion, and written materials to support subject matter. The instructor gives an explanation  and review of diabetes co-morbidities, pre- and post-prandial blood glucose goals, pre-exercise blood glucose goals, signs, symptoms, and treatment of hypoglycemia and hyperglycemia, and foot care basics. Flowsheet Row CARDIAC REHAB PHASE II EXERCISE from 07/10/2017 in [place]  Date  06/28/17  Educator  RD  Instruction Review Code  2- Demonstrated Understanding      Diabetes Blitz:  -Group instruction provided by PowerPoint slides, verbal discussion, and written materials to support subject matter. The instructor gives an explanation and review of the physiology behind type 1 and type 2 diabetes, diabetes medications and rational behind using different medications, pre- and post-prandial blood glucose recommendations and Hemoglobin A1c goals, diabetes diet, and exercise including blood glucose guidelines for exercising safely.    Portion Distortion:  -Group instruction provided by PowerPoint slides, verbal discussion, written materials, and food models to support subject matter. The instructor gives an explanation of serving size versus portion size, changes in portions sizes over the last 20 years, and what consists of a serving from each food group. Flowsheet Row CARDIAC REHAB PHASE II EXERCISE from 07/10/2017 in [place]  Date  06/19/17  Educator  RD  Instruction Review Code  2- Demonstrated Understanding      Stress Management:  -Group instruction provided by verbal instruction, video, and written materials to support subject matter.  Instructors review role of stress in heart disease and how to cope with stress positively.   Flowsheet Row CARDIAC REHAB PHASE II EXERCISE from 07/10/2017 in [place]  Date  06/26/17  Instruction Review Code  2- Demonstrated Understanding      Exercising on Your Own:  -Group instruction provided by verbal instruction, power point, and written materials  to support subject.  Instructors discuss benefits of exercise, components of exercise, frequency and intensity of exercise, and end points for exercise.  Also discuss use of nitroglycerin and activating EMS.  Review options of places to exercise outside of rehab.  Review guidelines for sex with heart disease.   Cardiac Drugs I:  -Group instruction provided by verbal instruction and written materials to support subject.  Instructor reviews cardiac drug classes: antiplatelets, anticoagulants, beta blockers, and statins.  Instructor discusses reasons, side effects, and lifestyle considerations for each drug class.   Cardiac Drugs II:  -Group instruction provided by verbal instruction and written materials to support subject.  Instructor reviews cardiac drug classes: angiotensin converting enzyme inhibitors (ACE-I), angiotensin II receptor blockers (ARBs), nitrates, and calcium channel blockers.  Instructor discusses reasons, side effects, and lifestyle considerations for each drug class. Flowsheet Row CARDIAC REHAB PHASE II EXERCISE from 07/10/2017 in [place]  Date  07/03/17  Educator  -- [pharmacist]  Instruction Review Code  2- Demonstrated Understanding  Anatomy and Physiology of the Circulatory System:  Group verbal and written instruction and models provide basic cardiac anatomy and physiology, with the coronary electrical and arterial systems. Review of: AMI, Angina, Valve disease, Heart Failure, Peripheral Artery Disease, Cardiac Arrhythmia, Pacemakers, and the ICD. Flowsheet Row CARDIAC REHAB PHASE II EXERCISE from 07/10/2017 in [place]  Date  07/10/17  Instruction Review Code  2- Demonstrated Understanding      Other Education:  -Group or individual verbal, written, or video instructions that support the educational goals of the cardiac rehab program.   Holiday Eating Survival Tips:  -Group instruction provided  by PowerPoint slides, verbal discussion, and written materials to support subject matter. The instructor gives patients tips, tricks, and techniques to help them not only survive but enjoy the holidays despite the onslaught of food that accompanies the holidays.   Knowledge Questionnaire Score: Knowledge Questionnaire Score - 05/28/17 1113    Knowledge Questionnaire Score          Pre Score  22/24           Core Components/Risk Factors/Patient Goals at Admission: Personal Goals and Risk Factors at Admission - 05/28/17 1105    Core Components/Risk Factors/Patient Goals on Admission           Weight Management  Yes;Obesity    Intervention  Weight Management/Obesity: Establish reasonable short term and long term weight goals.;Obesity: Provide education and appropriate resources to help participant work on and attain dietary goals.    Admit Weight  215 lb 2.7 oz (97.6 kg)    Goal Weight: Short Term  209 lb 3.2 oz (94.9 kg)    Goal Weight: Long Term  195 lb 3.2 oz (88.5 kg)    Expected Outcomes  Short Term: Continue to assess and modify interventions until short term weight is achieved;Long Term: Adherence to nutrition and physical activity/exercise program aimed toward attainment of established weight goal;Weight Loss: Understanding of general recommendations for a balanced deficit meal plan, which promotes 1-2 lb weight loss per week and includes a negative energy balance of [phone] kcal/d;Understanding of distribution of calorie intake throughout the day with the consumption of 4-5 meals/snacks;Understanding recommendations for meals to include 15-35% energy as protein, 25-35% energy from fat, 35-60% energy from carbohydrates, less than 200mg  of dietary cholesterol, 20-35 gm of total fiber daily    Hypertension  Yes    Intervention  Provide education on lifestyle modifcations including regular physical activity/exercise, weight management, moderate sodium restriction and increased consumption  of fresh fruit, vegetables, and low fat dairy, alcohol moderation, and smoking cessation.;Monitor prescription use compliance.    Expected Outcomes  Short Term: Continued assessment and intervention until BP is < 140/55mm HG in hypertensive participants. < 130/22mm HG in hypertensive participants with diabetes, heart failure or chronic kidney disease.;Long Term: Maintenance of blood pressure at goal levels.    Lipids  Yes    Intervention  Provide education and support for participant on nutrition & aerobic/resistive exercise along with prescribed medications to achieve LDL 70mg , HDL >40mg .    Expected Outcomes  Short Term: Participant states understanding of desired cholesterol values and is compliant with medications prescribed. Participant is following exercise prescription and nutrition guidelines.;Long Term: Cholesterol controlled with medications as prescribed, with individualized exercise RX and with personalized nutrition plan. Value goals: LDL < 70mg , HDL > 40 mg.           Core Components/Risk Factors/Patient Goals Review:  Goals and Risk Factor Review  Core Components/Risk Factors/Patient Goals Review    Row Name 06/07/17 1114 06/10/17 1529 07/10/17 1155   Personal Goals Review  Weight Management/Obesity;Hypertension;Lipids;Diabetes  Weight Management/Obesity;Hypertension;Lipids;Diabetes  Weight Management/Obesity;Hypertension;Lipids;Diabetes   Review  pt with multiple CAD RF demonstrates eagerness to participate in CR program.  pt completed program 10 years ago and is interested in re-establishing home exercise program to reduce disease progression.  pt with multiple CAD RF demonstrates eagerness to participate in CR program.  pt completed program 10 years ago and is interested in re-establishing home exercise program to reduce disease progression.  pt with multiple CAD RF demonstrates eagerness to participate in CR program.  pt completed program 10 years ago.  pt has not restarted HEP  however remains "active".  pt does c/o occasional fatigue with golf.     Expected Outcomes  pt will participate in CR exercise, nutrition and lifestyle modification opportunties to decrease overall RF.    pt will participate in CR exercise, nutrition and lifestyle modification opportunties to decrease overall RF.    pt will participate in CR exercise, nutrition and lifestyle modification opportunties to decrease overall RF.            Core Components/Risk Factors/Patient Goals at Discharge (Final Review):  Goals and Risk Factor Review - 07/10/17 1155    Core Components/Risk Factors/Patient Goals Review          Personal Goals Review  Weight Management/Obesity;Hypertension;Lipids;Diabetes    Review  pt with multiple CAD RF demonstrates eagerness to participate in CR program.  pt completed program 10 years ago.  pt has not restarted HEP however remains "active".  pt does c/o occasional fatigue with golf.      Expected Outcomes  pt will participate in CR exercise, nutrition and lifestyle modification opportunties to decrease overall RF.             ITP Comments: ITP Comments    Row Name 05/28/17 8206 06/07/17 1111 06/10/17 1529 07/10/17 1155   ITP Comments  Medical Director, Dr. Fransico Him, MD  pt started group exercise sessions. pt tolerated light activity without difficulty. pt demonstrates eagerness to participate in CR program  30 day ITP.  pt with good attendance and participation.   30 day ITP.  pt with good attendance and participation.       Comments:  Andi Hence, RN, BSN Cardiac Pulmonary Rehab 07/11/17 1:59 PM

## 2017-07-12 ENCOUNTER — Encounter (HOSPITAL_COMMUNITY)
Admission: RE | Admit: 2017-07-12 | Discharge: 2017-07-12 | Disposition: A | Discharge status: Home / Self Care | Payer: Medicare Other | Source: Ambulatory Visit | Attending: Cardiovascular Disease | Admitting: Cardiovascular Disease

## 2017-07-12 ENCOUNTER — Encounter (HOSPITAL_COMMUNITY): Payer: Medicare Other

## 2017-07-12 DIAGNOSIS — G4733 Obstructive sleep apnea (adult) (pediatric): Secondary | ICD-10-CM | POA: Diagnosis not present

## 2017-07-12 DIAGNOSIS — E119 Type 2 diabetes mellitus without complications: Secondary | ICD-10-CM | POA: Diagnosis not present

## 2017-07-12 DIAGNOSIS — I214 Non-ST elevation (NSTEMI) myocardial infarction: Secondary | ICD-10-CM | POA: Diagnosis not present

## 2017-07-12 DIAGNOSIS — Z955 Presence of coronary angioplasty implant and graft: Secondary | ICD-10-CM | POA: Diagnosis not present

## 2017-07-12 DIAGNOSIS — E785 Hyperlipidemia, unspecified: Secondary | ICD-10-CM | POA: Diagnosis not present

## 2017-07-12 DIAGNOSIS — I10 Essential (primary) hypertension: Secondary | ICD-10-CM | POA: Diagnosis not present

## 2017-07-15 ENCOUNTER — Encounter (HOSPITAL_COMMUNITY)
Admission: RE | Admit: 2017-07-15 | Discharge: 2017-07-15 | Disposition: A | Discharge status: Home / Self Care | Payer: Medicare Other | Source: Ambulatory Visit | Attending: Cardiovascular Disease | Admitting: Cardiovascular Disease

## 2017-07-15 ENCOUNTER — Encounter (HOSPITAL_COMMUNITY): Payer: Medicare Other

## 2017-07-15 DIAGNOSIS — E785 Hyperlipidemia, unspecified: Secondary | ICD-10-CM | POA: Diagnosis not present

## 2017-07-15 DIAGNOSIS — I214 Non-ST elevation (NSTEMI) myocardial infarction: Secondary | ICD-10-CM | POA: Diagnosis not present

## 2017-07-15 DIAGNOSIS — G4733 Obstructive sleep apnea (adult) (pediatric): Secondary | ICD-10-CM | POA: Diagnosis not present

## 2017-07-15 DIAGNOSIS — I10 Essential (primary) hypertension: Secondary | ICD-10-CM | POA: Diagnosis not present

## 2017-07-15 DIAGNOSIS — E119 Type 2 diabetes mellitus without complications: Secondary | ICD-10-CM | POA: Diagnosis not present

## 2017-07-15 DIAGNOSIS — Z955 Presence of coronary angioplasty implant and graft: Secondary | ICD-10-CM

## 2017-07-17 ENCOUNTER — Encounter (HOSPITAL_COMMUNITY)
Admission: RE | Admit: 2017-07-17 | Discharge: 2017-07-17 | Disposition: A | Discharge status: Home / Self Care | Payer: Medicare Other | Source: Ambulatory Visit | Attending: Cardiovascular Disease | Admitting: Cardiovascular Disease

## 2017-07-17 ENCOUNTER — Encounter (HOSPITAL_COMMUNITY): Payer: Medicare Other

## 2017-07-17 DIAGNOSIS — I214 Non-ST elevation (NSTEMI) myocardial infarction: Secondary | ICD-10-CM | POA: Diagnosis not present

## 2017-07-17 DIAGNOSIS — E119 Type 2 diabetes mellitus without complications: Secondary | ICD-10-CM | POA: Diagnosis not present

## 2017-07-17 DIAGNOSIS — G4733 Obstructive sleep apnea (adult) (pediatric): Secondary | ICD-10-CM | POA: Diagnosis not present

## 2017-07-17 DIAGNOSIS — Z955 Presence of coronary angioplasty implant and graft: Secondary | ICD-10-CM | POA: Diagnosis not present

## 2017-07-17 DIAGNOSIS — I10 Essential (primary) hypertension: Secondary | ICD-10-CM | POA: Diagnosis not present

## 2017-07-17 DIAGNOSIS — E785 Hyperlipidemia, unspecified: Secondary | ICD-10-CM | POA: Diagnosis not present

## 2017-07-19 ENCOUNTER — Encounter (HOSPITAL_COMMUNITY): Payer: Medicare Other

## 2017-07-19 ENCOUNTER — Encounter (HOSPITAL_COMMUNITY)
Admission: RE | Admit: 2017-07-19 | Discharge: 2017-07-19 | Disposition: A | Discharge status: Home / Self Care | Payer: Medicare Other | Source: Ambulatory Visit | Attending: Cardiovascular Disease | Admitting: Cardiovascular Disease

## 2017-07-19 DIAGNOSIS — Z955 Presence of coronary angioplasty implant and graft: Secondary | ICD-10-CM

## 2017-07-19 DIAGNOSIS — I214 Non-ST elevation (NSTEMI) myocardial infarction: Secondary | ICD-10-CM | POA: Diagnosis not present

## 2017-07-19 DIAGNOSIS — G4733 Obstructive sleep apnea (adult) (pediatric): Secondary | ICD-10-CM | POA: Diagnosis not present

## 2017-07-19 DIAGNOSIS — E785 Hyperlipidemia, unspecified: Secondary | ICD-10-CM | POA: Diagnosis not present

## 2017-07-19 DIAGNOSIS — I10 Essential (primary) hypertension: Secondary | ICD-10-CM | POA: Diagnosis not present

## 2017-07-19 DIAGNOSIS — E119 Type 2 diabetes mellitus without complications: Secondary | ICD-10-CM | POA: Diagnosis not present

## 2017-07-22 ENCOUNTER — Encounter (HOSPITAL_COMMUNITY)
Admission: RE | Admit: 2017-07-22 | Discharge: 2017-07-22 | Disposition: A | Discharge status: Home / Self Care | Payer: Medicare Other | Source: Ambulatory Visit | Attending: Cardiovascular Disease | Admitting: Cardiovascular Disease

## 2017-07-22 ENCOUNTER — Encounter (HOSPITAL_COMMUNITY): Payer: Medicare Other

## 2017-07-22 DIAGNOSIS — I10 Essential (primary) hypertension: Secondary | ICD-10-CM | POA: Diagnosis not present

## 2017-07-22 DIAGNOSIS — Z955 Presence of coronary angioplasty implant and graft: Secondary | ICD-10-CM

## 2017-07-22 DIAGNOSIS — G4733 Obstructive sleep apnea (adult) (pediatric): Secondary | ICD-10-CM | POA: Diagnosis not present

## 2017-07-22 DIAGNOSIS — E785 Hyperlipidemia, unspecified: Secondary | ICD-10-CM | POA: Diagnosis not present

## 2017-07-22 DIAGNOSIS — I214 Non-ST elevation (NSTEMI) myocardial infarction: Secondary | ICD-10-CM | POA: Diagnosis not present

## 2017-07-22 DIAGNOSIS — E119 Type 2 diabetes mellitus without complications: Secondary | ICD-10-CM | POA: Diagnosis not present

## 2017-07-23 DIAGNOSIS — D2262 Melanocytic nevi of left upper limb, including shoulder: Secondary | ICD-10-CM | POA: Diagnosis not present

## 2017-07-23 DIAGNOSIS — L570 Actinic keratosis: Secondary | ICD-10-CM | POA: Diagnosis not present

## 2017-07-23 DIAGNOSIS — L821 Other seborrheic keratosis: Secondary | ICD-10-CM | POA: Diagnosis not present

## 2017-07-24 ENCOUNTER — Encounter (HOSPITAL_COMMUNITY): Payer: Medicare Other

## 2017-07-24 ENCOUNTER — Encounter (HOSPITAL_COMMUNITY)
Admission: RE | Admit: 2017-07-24 | Discharge: 2017-07-24 | Disposition: A | Discharge status: Home / Self Care | Payer: Medicare Other | Source: Ambulatory Visit | Attending: Cardiovascular Disease | Admitting: Cardiovascular Disease

## 2017-07-24 DIAGNOSIS — Z7982 Long term (current) use of aspirin: Secondary | ICD-10-CM | POA: Insufficient documentation

## 2017-07-24 DIAGNOSIS — E119 Type 2 diabetes mellitus without complications: Secondary | ICD-10-CM | POA: Insufficient documentation

## 2017-07-24 DIAGNOSIS — Z955 Presence of coronary angioplasty implant and graft: Secondary | ICD-10-CM | POA: Diagnosis not present

## 2017-07-24 DIAGNOSIS — I10 Essential (primary) hypertension: Secondary | ICD-10-CM | POA: Insufficient documentation

## 2017-07-24 DIAGNOSIS — Z79899 Other long term (current) drug therapy: Secondary | ICD-10-CM | POA: Insufficient documentation

## 2017-07-24 DIAGNOSIS — E785 Hyperlipidemia, unspecified: Secondary | ICD-10-CM | POA: Diagnosis not present

## 2017-07-24 DIAGNOSIS — G4733 Obstructive sleep apnea (adult) (pediatric): Secondary | ICD-10-CM | POA: Diagnosis not present

## 2017-07-24 DIAGNOSIS — I214 Non-ST elevation (NSTEMI) myocardial infarction: Secondary | ICD-10-CM | POA: Diagnosis not present

## 2017-07-24 DIAGNOSIS — Z87891 Personal history of nicotine dependence: Secondary | ICD-10-CM | POA: Diagnosis not present

## 2017-07-26 ENCOUNTER — Encounter (HOSPITAL_COMMUNITY): Payer: Medicare Other

## 2017-07-26 ENCOUNTER — Encounter (HOSPITAL_COMMUNITY)
Admission: RE | Admit: 2017-07-26 | Discharge: 2017-07-26 | Disposition: A | Discharge status: Home / Self Care | Payer: Medicare Other | Source: Ambulatory Visit | Attending: Cardiovascular Disease | Admitting: Cardiovascular Disease

## 2017-07-26 DIAGNOSIS — I10 Essential (primary) hypertension: Secondary | ICD-10-CM | POA: Diagnosis not present

## 2017-07-26 DIAGNOSIS — E119 Type 2 diabetes mellitus without complications: Secondary | ICD-10-CM | POA: Diagnosis not present

## 2017-07-26 DIAGNOSIS — G4733 Obstructive sleep apnea (adult) (pediatric): Secondary | ICD-10-CM | POA: Diagnosis not present

## 2017-07-26 DIAGNOSIS — Z955 Presence of coronary angioplasty implant and graft: Secondary | ICD-10-CM

## 2017-07-26 DIAGNOSIS — I214 Non-ST elevation (NSTEMI) myocardial infarction: Secondary | ICD-10-CM | POA: Diagnosis not present

## 2017-07-26 DIAGNOSIS — E785 Hyperlipidemia, unspecified: Secondary | ICD-10-CM | POA: Diagnosis not present

## 2017-07-29 ENCOUNTER — Encounter (HOSPITAL_COMMUNITY): Payer: Medicare Other

## 2017-07-29 ENCOUNTER — Encounter (HOSPITAL_COMMUNITY)
Admission: RE | Admit: 2017-07-29 | Discharge: 2017-07-29 | Disposition: A | Discharge status: Home / Self Care | Payer: Medicare Other | Source: Ambulatory Visit | Attending: Cardiovascular Disease | Admitting: Cardiovascular Disease

## 2017-07-29 DIAGNOSIS — E785 Hyperlipidemia, unspecified: Secondary | ICD-10-CM | POA: Diagnosis not present

## 2017-07-29 DIAGNOSIS — E119 Type 2 diabetes mellitus without complications: Secondary | ICD-10-CM | POA: Diagnosis not present

## 2017-07-29 DIAGNOSIS — I214 Non-ST elevation (NSTEMI) myocardial infarction: Secondary | ICD-10-CM | POA: Diagnosis not present

## 2017-07-29 DIAGNOSIS — Z955 Presence of coronary angioplasty implant and graft: Secondary | ICD-10-CM

## 2017-07-29 DIAGNOSIS — G4733 Obstructive sleep apnea (adult) (pediatric): Secondary | ICD-10-CM | POA: Diagnosis not present

## 2017-07-29 DIAGNOSIS — I10 Essential (primary) hypertension: Secondary | ICD-10-CM | POA: Diagnosis not present

## 2017-07-31 ENCOUNTER — Encounter (HOSPITAL_COMMUNITY): Payer: Medicare Other

## 2017-07-31 ENCOUNTER — Encounter (HOSPITAL_COMMUNITY)
Admission: RE | Admit: 2017-07-31 | Discharge: 2017-07-31 | Disposition: A | Discharge status: Home / Self Care | Payer: Medicare Other | Source: Ambulatory Visit | Attending: Cardiovascular Disease | Admitting: Cardiovascular Disease

## 2017-07-31 DIAGNOSIS — Z955 Presence of coronary angioplasty implant and graft: Secondary | ICD-10-CM | POA: Diagnosis not present

## 2017-07-31 DIAGNOSIS — E119 Type 2 diabetes mellitus without complications: Secondary | ICD-10-CM | POA: Diagnosis not present

## 2017-07-31 DIAGNOSIS — E785 Hyperlipidemia, unspecified: Secondary | ICD-10-CM | POA: Diagnosis not present

## 2017-07-31 DIAGNOSIS — I10 Essential (primary) hypertension: Secondary | ICD-10-CM | POA: Diagnosis not present

## 2017-07-31 DIAGNOSIS — G4733 Obstructive sleep apnea (adult) (pediatric): Secondary | ICD-10-CM | POA: Diagnosis not present

## 2017-07-31 DIAGNOSIS — I214 Non-ST elevation (NSTEMI) myocardial infarction: Secondary | ICD-10-CM | POA: Diagnosis not present

## 2017-08-02 ENCOUNTER — Encounter (HOSPITAL_COMMUNITY): Payer: Medicare Other

## 2017-08-02 ENCOUNTER — Encounter (HOSPITAL_COMMUNITY)
Admission: RE | Admit: 2017-08-02 | Discharge: 2017-08-02 | Disposition: A | Discharge status: Home / Self Care | Payer: Medicare Other | Source: Ambulatory Visit | Attending: Cardiovascular Disease | Admitting: Cardiovascular Disease

## 2017-08-02 ENCOUNTER — Encounter (HOSPITAL_COMMUNITY): Payer: Self-pay

## 2017-08-02 DIAGNOSIS — Z955 Presence of coronary angioplasty implant and graft: Secondary | ICD-10-CM

## 2017-08-02 DIAGNOSIS — E119 Type 2 diabetes mellitus without complications: Secondary | ICD-10-CM | POA: Diagnosis not present

## 2017-08-02 DIAGNOSIS — E785 Hyperlipidemia, unspecified: Secondary | ICD-10-CM | POA: Diagnosis not present

## 2017-08-02 DIAGNOSIS — G4733 Obstructive sleep apnea (adult) (pediatric): Secondary | ICD-10-CM | POA: Diagnosis not present

## 2017-08-02 DIAGNOSIS — I214 Non-ST elevation (NSTEMI) myocardial infarction: Secondary | ICD-10-CM | POA: Diagnosis not present

## 2017-08-02 DIAGNOSIS — I10 Essential (primary) hypertension: Secondary | ICD-10-CM | POA: Diagnosis not present

## 2017-08-05 ENCOUNTER — Encounter (HOSPITAL_COMMUNITY)
Admission: RE | Admit: 2017-08-05 | Discharge: 2017-08-05 | Disposition: A | Discharge status: Home / Self Care | Payer: Medicare Other | Source: Ambulatory Visit | Attending: Cardiovascular Disease | Admitting: Cardiovascular Disease

## 2017-08-05 ENCOUNTER — Encounter (HOSPITAL_COMMUNITY): Payer: Medicare Other

## 2017-08-05 DIAGNOSIS — Z955 Presence of coronary angioplasty implant and graft: Secondary | ICD-10-CM

## 2017-08-05 DIAGNOSIS — E119 Type 2 diabetes mellitus without complications: Secondary | ICD-10-CM | POA: Diagnosis not present

## 2017-08-05 DIAGNOSIS — I214 Non-ST elevation (NSTEMI) myocardial infarction: Secondary | ICD-10-CM | POA: Diagnosis not present

## 2017-08-05 DIAGNOSIS — I10 Essential (primary) hypertension: Secondary | ICD-10-CM | POA: Diagnosis not present

## 2017-08-05 DIAGNOSIS — G4733 Obstructive sleep apnea (adult) (pediatric): Secondary | ICD-10-CM | POA: Diagnosis not present

## 2017-08-05 DIAGNOSIS — E785 Hyperlipidemia, unspecified: Secondary | ICD-10-CM | POA: Diagnosis not present

## 2017-08-07 ENCOUNTER — Encounter (HOSPITAL_COMMUNITY)
Admission: RE | Admit: 2017-08-07 | Discharge: 2017-08-07 | Disposition: A | Discharge status: Home / Self Care | Payer: Medicare Other | Source: Ambulatory Visit | Attending: Cardiovascular Disease | Admitting: Cardiovascular Disease

## 2017-08-07 ENCOUNTER — Encounter (HOSPITAL_COMMUNITY): Payer: Medicare Other

## 2017-08-07 DIAGNOSIS — E119 Type 2 diabetes mellitus without complications: Secondary | ICD-10-CM | POA: Diagnosis not present

## 2017-08-07 DIAGNOSIS — E785 Hyperlipidemia, unspecified: Secondary | ICD-10-CM | POA: Diagnosis not present

## 2017-08-07 DIAGNOSIS — G4733 Obstructive sleep apnea (adult) (pediatric): Secondary | ICD-10-CM | POA: Diagnosis not present

## 2017-08-07 DIAGNOSIS — I10 Essential (primary) hypertension: Secondary | ICD-10-CM | POA: Diagnosis not present

## 2017-08-07 DIAGNOSIS — I214 Non-ST elevation (NSTEMI) myocardial infarction: Secondary | ICD-10-CM | POA: Diagnosis not present

## 2017-08-07 DIAGNOSIS — Z955 Presence of coronary angioplasty implant and graft: Secondary | ICD-10-CM

## 2017-08-08 NOTE — Progress Notes (Signed)
Cardiac Individual Treatment Plan  Patient Details  Name: Justin Carr MRN: [id number] Date of Birth: 1945/06/29 Referring Provider:   Flowsheet Row CARDIAC REHAB PHASE II ORIENTATION from 05/28/2017 in [place]  Referring Provider  Quay Burow, MD.      Initial Encounter Date:  Flowsheet Row CARDIAC REHAB PHASE II ORIENTATION from 05/28/2017 in [place]  Date  05/28/17  Referring Provider  Quay Burow, MD.      Visit Diagnosis: Status post coronary artery stent placement 04/11/17 [facility]  Patient's Home Medications on Admission:  Current Outpatient Medications:  .  ALPHA-LIPOIC ACID PO, Take 1 tablet by mouth daily., Disp: , Rfl:  .  aspirin 81 MG tablet, Take 1 tablet (81 mg total) by mouth daily., Disp: 90 tablet, Rfl: 3 .  clopidogrel (PLAVIX) 75 MG tablet, Take 1 tablet (75 mg total) by mouth daily., Disp: 90 tablet, Rfl: 3 .  Coenzyme Q10 (CO Q 10 PO), Take 1 capsule by mouth daily. , Disp: , Rfl:  .  Cyanocobalamin (B-12) 2500 MCG TABS, Take 2,500 mcg by mouth daily. , Disp: , Rfl:  .  gabapentin (NEURONTIN) 300 MG capsule, Take 1 capsule (300 mg total) by mouth 3 (three) times daily., Disp: 270 capsule, Rfl: 3 .  metFORMIN (GLUCOPHAGE) 1000 MG tablet, Take 1,000 mg by mouth 2 (two) times daily with a meal., Disp: , Rfl:  .  Multiple Vitamin (MULTIVITAMIN WITH MINERALS) TABS tablet, Take 1 tablet by mouth daily., Disp: , Rfl:  .  Multiple Vitamins-Minerals (PRESERVISION AREDS PO), Take 1 capsule by mouth daily. , Disp: , Rfl:  .  nitroGLYCERIN (NITROSTAT) 0.4 MG SL tablet, Place 1 tablet (0.4 mg total) under the tongue every 5 (five) minutes as needed for chest pain., Disp: 25 tablet, Rfl: 0 .  ramipril (ALTACE) 2.5 MG capsule, Take 1 capsule (2.5 mg total) by mouth daily., Disp: 90 capsule, Rfl: 3 .  ranitidine (ZANTAC) 150 MG tablet, Take 1 tablet (150 mg total) by mouth 2 (two) times daily., Disp: 180  tablet, Rfl: 3 .  rosuvastatin (CRESTOR) 20 MG tablet, Take 1 tablet (20 mg total) by mouth 3 (three) times a week., Disp: 36 tablet, Rfl: 3 .  sildenafil (REVATIO) 20 MG tablet, Take 20-100 mg by mouth as needed (for erectile disfunction). , Disp: , Rfl:  .  sitaGLIPtin (JANUVIA) 50 MG tablet, Take 50 mg by mouth daily., Disp: , Rfl:   Past Medical History: Past Medical History:  Diagnosis Date  . Coronary artery disease   . GERD (gastroesophageal reflux disease)   . Hyperlipidemia   . Hypertension   . NSTEMI (non-ST elevated myocardial infarction) ([place]) 06/2007   [name] 07/27/2010  . OSA on CPAP   . Palpitations   . Pneumonia ~ 1971 X 1  . Type II diabetes mellitus ([facility])     Tobacco Use: Social History   Tobacco Use  Smoking Status Former Smoker  . Packs/day: 1.00  . Years: 30.00  . Pack years: 30.00  . Types: Cigarettes  . Last attempt to quit: 07/03/2006  . Years since quitting: 11.1  Smokeless Tobacco Never Used    Labs: Recent Review Scientist, physiological    Labs for ITP Cardiac and Pulmonary Rehab Latest Ref Rng & Units 07/02/2007 07/03/2007 06/21/2016   Cholestrol <200 mg/dL - 199 ATP III CLASSIFICATION: <200     mg/dL   Desirable 200-239  mg/dL   Borderline High >=240  mg/dL   High 113   LDLCALC <100 mg/dL - 139 Total Cholesterol/HDL:CHD Risk Coronary Heart Disease Risk Table Men   Women 1/2 Average Risk   3.4   3.3(H) 48   HDL >40 mg/dL - 30(L) 35(L)   Trlycerides <150 mg/dL - 150(H) 151(H)   Hemoglobin A1c - - 6.2 (NOTE)   The ADA recommends the following therapeutic goals for glycemic   control related to Hgb A1C measurement:   Goal of Therapy:   < 7.0% Hgb A1C   Action Suggested:  > 8.0% Hgb A1C   Ref:  Diabetes Care, 22, Suppl. 1, 1999(H) -   TCO2 - 29 - -      Capillary Blood Glucose: Lab Results  Component Value Date   GLUCAP 150 (H) 06/24/2017   GLUCAP 204 (H) 06/21/2017   GLUCAP 229 (H) 06/19/2017   GLUCAP 207 (H) 06/17/2017   GLUCAP 218 (H)  06/14/2017     Exercise Target Goals:    Exercise Program Goal: Individual exercise prescription set using results from initial 6 min walk test and THRR while considering  patient's activity barriers and safety.   Exercise Prescription Goal: Initial exercise prescription builds to 30-45 minutes a day of aerobic activity, 2-3 days per week.  Home exercise guidelines will be given to patient during program as part of exercise prescription that the participant will acknowledge.  Activity Barriers & Risk Stratification: Activity Barriers & Cardiac Risk Stratification - 05/28/17 1107    Activity Barriers & Cardiac Risk Stratification          Activity Barriers  Other (comment)    Comments  Right lateral hip pain.    Cardiac Risk Stratification  Moderate           6 Minute Walk: 6 Minute Walk    6 Minute Walk    Row Name 05/28/17 0954   Phase  Initial   Distance  1643 feet   Walk Time  6 minutes   # of Rest Breaks  0   MPH  3.11   METS  3.33   RPE  11   VO2 Peak  11.66   Symptoms  No   Resting HR  73 bpm   Resting BP  118/60   Resting Oxygen Saturation   98 %   Exercise Oxygen Saturation  during 6 min walk  98 %   Max Ex. HR  94 bpm   Max Ex. BP  142/64   2 Minute Post BP  128/64          Oxygen Initial Assessment:   Oxygen Re-Evaluation:   Oxygen Discharge (Final Oxygen Re-Evaluation):   Initial Exercise Prescription: Initial Exercise Prescription - 05/28/17 1100    Date of Initial Exercise RX and Referring Provider          Date  05/28/17    Referring Provider  Quay Burow, MD.        Treadmill          MPH  2.6    Grade  1    Minutes  10    METs  3.35        Bike          Level  1.2    Minutes  10    METs  3.32        NuStep          Level  3    SPM  85    Minutes  10  METs  3        Prescription Details          Frequency (times per week)  3    Duration  Progress to 30 minutes of continuous aerobic without signs/symptoms  of physical distress        Intensity          THRR 40-80% of Max Heartrate  60-119    Ratings of Perceived Exertion  11-13    Perceived Dyspnea  0-4        Progression          Progression  Continue to progress workloads to maintain intensity without signs/symptoms of physical distress.        Resistance Training          Training Prescription  Yes    Weight  4lbs    Reps  10-15           Perform Capillary Blood Glucose checks as needed.  Exercise Prescription Changes: Exercise Prescription Changes    Response to Exercise    Row Name 06/07/17 1200 06/24/17 0824 07/08/17 1618 07/24/17 1200 08/07/17 1000   Blood Pressure (Admit)  114/60  142/70  124/70  144/62  122/62   Blood Pressure (Exercise)  146/62  170/70 recheck 148/62  150/72 recheck 148/62  142/66 recheck 148/62  140/60 recheck 148/62   Blood Pressure (Exit)  132/70  112/58  118/60  122/58  116/62   Heart Rate (Admit)  84 bpm  81 bpm  82 bpm  72 bpm  85 bpm   Heart Rate (Exercise)  115 bpm  125 bpm  126 bpm  119 bpm  117 bpm   Heart Rate (Exit)  76 bpm  84 bpm  82 bpm  74 bpm  82 bpm   Rating of Perceived Exertion (Exercise)  11  11  11  11  11    Symptoms  none  none  none  none  none   Comments  pt oriented to exercise equipment today  no documentation  no documentation  no documentation  no documentation   Duration  Continue with 30 min of aerobic exercise without signs/symptoms of physical distress.  Continue with 30 min of aerobic exercise without signs/symptoms of physical distress.  Continue with 30 min of aerobic exercise without signs/symptoms of physical distress.  Continue with 30 min of aerobic exercise without signs/symptoms of physical distress.  Continue with 30 min of aerobic exercise without signs/symptoms of physical distress.   Intensity  THRR unchanged  THRR unchanged  THRR unchanged  THRR unchanged  THRR unchanged       Progression    Row Name 06/07/17 1200 06/24/17 0824 07/08/17 1618 07/24/17  1200 08/07/17 1000   Progression  Continue to progress workloads to maintain intensity without signs/symptoms of physical distress.  Continue to progress workloads to maintain intensity without signs/symptoms of physical distress.  Continue to progress workloads to maintain intensity without signs/symptoms of physical distress.  Continue to progress workloads to maintain intensity without signs/symptoms of physical distress.  Continue to progress workloads to maintain intensity without signs/symptoms of physical distress.   Average METs  3  5  4.3  4.7  4.8       Resistance Training    Row Name 06/07/17 1200 06/24/17 0824 07/08/17 1618 07/24/17 1200 08/07/17 1000   Training Prescription  Yes  Yes  Yes  No relaxation  No relaxation   Weight  4lbs  4lbs  4lbs  no documentation  no documentation   Reps  10-15  10-15  10-15  no documentation  no documentation   Time  10 Minutes  10 Minutes  10 Minutes  no documentation  no documentation       Treadmill    Row Name 06/07/17 1200 06/24/17 0824 07/08/17 1618 07/24/17 1200 08/07/17 1000   MPH  2.6  3.2  3.2  3.4  3.4   Grade  1  3  3  3  3    Minutes  10  10  10  10  10    METs  3.35  4.77  4.77  5.01  5.01       Bike    Row Name 06/07/17 1200 06/24/17 0824 07/08/17 1618 07/24/17 1200 08/07/17 1000   Level  1.2  1.8  1.8  no documentation  no documentation   Minutes  10  10  10   no documentation  no documentation   METs  3.32  4.55  4.52  no documentation  no documentation       [place] Name 06/07/17 1200 06/24/17 0824 07/08/17 1618 07/24/17 1200 08/07/17 1000   Level  3  5  5  5  5    SPM  85  100  90  100  105   Minutes  10  10  10  10  10    METs  2.4  5.8  3.6  3.6  3.9       Rower    Row Name 06/07/17 1200 06/24/17 0824 07/08/17 1618 07/24/17 1200 08/07/17 1000   Level  no documentation  no documentation  no documentation  4  4   Watts  no documentation  no documentation  no documentation  60  60   Minutes  no documentation  no  documentation  no documentation  10  10   METs  no documentation  no documentation  no documentation  5.5  5.5       Home Exercise Plan    [place] Name 06/07/17 1200 06/24/17 0824 07/08/17 1618 07/24/17 1200 08/07/17 1000   Plans to continue exercise at  no documentation  Home (comment) walking  Home (comment) walking  Home (comment) walking  Home (comment) walking   Frequency  no documentation  Add 2 additional days to program exercise sessions.  Add 2 additional days to program exercise sessions.  Add 2 additional days to program exercise sessions.  Add 2 additional days to program exercise sessions.   Initial Home Exercises Provided  no documentation  06/17/17  06/17/17  06/17/17  06/17/17          Exercise Comments: Exercise Comments    Row Name 06/07/17 1208 07/09/17 1353 08/06/17 1455   Exercise Comments  Pt responded to exercise session very well today. Pt was also oriented to exercise equipment and exercisee for 30 minutes safely   Pt responded to exercise session very well today. Pt was also oriented to exercise equipment and exercisee for 30 minutes safely   Reviewed METs and goals. Pt is tolerating exercise program very well. Will continue to monitor activity levels.      Exercise Goals and Review: Exercise Goals    Exercise Goals    Row Name 05/28/17 0929   Increase Physical Activity  Yes   Intervention  Provide advice, education, support and counseling about physical activity/exercise needs.;Develop an individualized exercise prescription for aerobic and resistive training based on initial evaluation findings, risk  stratification, comorbidities and participant's personal goals.   Expected Outcomes  Short Term: Attend rehab on a regular basis to increase amount of physical activity.;Long Term: Exercising regularly at least 3-5 days a week.;Long Term: Add in home exercise to make exercise part of routine and to increase amount of physical activity.   Increase Strength and Stamina   Yes   Intervention  Provide advice, education, support and counseling about physical activity/exercise needs.;Develop an individualized exercise prescription for aerobic and resistive training based on initial evaluation findings, risk stratification, comorbidities and participant's personal goals.   Expected Outcomes  Short Term: Increase workloads from initial exercise prescription for resistance, speed, and METs.;Short Term: Perform resistance training exercises routinely during rehab and add in resistance training at home;Long Term: Improve cardiorespiratory fitness, muscular endurance and strength as measured by increased METs and functional capacity (6MWT)   Able to understand and use rate of perceived exertion (RPE) scale  Yes   Intervention  Provide education and explanation on how to use RPE scale   Expected Outcomes  Short Term: Able to use RPE daily in rehab to express subjective intensity level;Long Term:  Able to use RPE to guide intensity level when exercising independently   Knowledge and understanding of Target Heart Rate Range (THRR)  Yes   Intervention  Provide education and explanation of THRR including how the numbers were predicted and where they are located for reference   Expected Outcomes  Short Term: Able to state/look up THRR;Long Term: Able to use THRR to govern intensity when exercising independently;Short Term: Able to use daily as guideline for intensity in rehab   Able to check pulse independently  Yes   Intervention  Provide education and demonstration on how to check pulse in carotid and radial arteries.;Review the importance of being able to check your own pulse for safety during independent exercise   Expected Outcomes  Short Term: Able to explain why pulse checking is important during independent exercise;Long Term: Able to check pulse independently and accurately   Understanding of Exercise Prescription  Yes   Intervention  Provide education, explanation, and written  materials on patient's individual exercise prescription   Expected Outcomes  Short Term: Able to explain program exercise prescription;Long Term: Able to explain home exercise prescription to exercise independently          Exercise Goals Re-Evaluation : Exercise Goals Re-Evaluation    Exercise Goal Re-Evaluation    Row Name 06/10/17 1044 06/17/17 0952 07/09/17 1353 08/06/17 1456   Exercise Goals Review  Increase Physical Activity;Able to understand and use rate of perceived exertion (RPE) scale;Understanding of Exercise Prescription  Knowledge and understanding of Target Heart Rate Range (THRR);Able to understand and use rate of perceived exertion (RPE) scale;Increase Physical Activity;Understanding of Exercise Prescription;Increase Strength and Stamina;Able to check pulse independently  Knowledge and understanding of Target Heart Rate Range (THRR);Able to understand and use rate of perceived exertion (RPE) scale;Increase Physical Activity;Understanding of Exercise Prescription;Increase Strength and Stamina;Able to check pulse independently  Knowledge and understanding of Target Heart Rate Range (THRR);Able to understand and use rate of perceived exertion (RPE) scale;Increase Physical Activity;Understanding of Exercise Prescription;Increase Strength and Stamina;Able to check pulse independently   Comments  Pt completed 2 sessions of cardiac rehab thus far. Pt is very fluent with exercise program and demonstrates proper use of RPE scale.  Reviewd HEP in which pt plans to walk at home for 30 minutes, 3x/week in addition to coming to cardiac rehab. Pt understands emergency/temperature precautions.  Encourage pt to get back on track with HEP! Pt plays golf 18 holes without difficulty. Reviewed difference between physical activity and exercise. Further discussed activity limitations, hydration and emergency action. Pt voiced understanding.   Pt noted an improvement in core and upper body strength since  using the rower machine. 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  Pt is also able to do yardwork and other household chores without difficulty.   Expected Outcomes  Pt will continue to exercise for 30 minutes without signs/symptoms of physical distress.  Pt will improve in cardiorespiratory fitness by coming to cardiac rehab and being compliant with home exercise.   Pt will improve in cardiorespiratory fitness by coming to cardiac rehab and being compliant with home exercise.   Pt will improve in cardiorespiratory fitness by coming to cardiac rehab and being compliant with home exercise.            Discharge Exercise Prescription (Final Exercise Prescription Changes): Exercise Prescription Changes - 08/07/17 1000    Response to Exercise          Blood Pressure (Admit)  122/62    Blood Pressure (Exercise)  140/60 recheck 148/62    Blood Pressure (Exit)  116/62    Heart Rate (Admit)  85 bpm    Heart Rate (Exercise)  117 bpm    Heart Rate (Exit)  82 bpm    Rating of Perceived Exertion (Exercise)  11    Symptoms  none    Duration  Continue with 30 min of aerobic exercise without signs/symptoms of physical distress.    Intensity  THRR unchanged        Progression          Progression  Continue to progress workloads to maintain intensity without signs/symptoms of physical distress.    Average METs  4.8        Resistance Training          Training Prescription  No relaxation        Treadmill          MPH  3.4    Grade  3    Minutes  10    METs  5.01        NuStep          Level  5    SPM  105    Minutes  10    METs  3.9        Rower          Level  4    Watts  60    Minutes  10    METs  5.5        Home Exercise Plan          Plans to continue exercise at  Home (comment) walking    Frequency  Add 2 additional days to program exercise sessions.    Initial Home Exercises Provided  06/17/17            Nutrition:  Target Goals: Understanding of nutrition guidelines, daily intake of sodium 1500mg , cholesterol 200mg , calories 30% from fat and 7% or less from saturated fats, daily to have 5 or more servings of fruits and vegetables.  Biometrics: Pre Biometrics - 05/28/17 1112    Pre Biometrics          Height  5' 10.5" (1.791 m)    Weight  215 lb 2.7  oz (97.6 kg)    Waist Circumference  42.25 inches    Hip Circumference  44 inches    Waist to Hip Ratio  0.96 %    BMI (Calculated)  30.43    Triceps Skinfold  20 mm    % Body Fat  30.6 %    Grip Strength  47 kg    Flexibility  9 in    Single Leg Stand  30 seconds            Nutrition Therapy Plan and Nutrition Goals: Nutrition Therapy & Goals - 05/28/17 0930    Nutrition Therapy          Diet  Carb Modified, Heart Healthy        Personal Nutrition Goals          Nutrition Goal  Pt to identify and limit food sources of saturated fat, trans fat, and sodium        Intervention Plan          Intervention  Prescribe, educate and counsel regarding individualized specific dietary modifications aiming towards targeted core components such as weight, hypertension, lipid management, diabetes, heart failure and other comorbidities.    Expected Outcomes  Short Term Goal: Understand basic principles of dietary content, such as calories, fat, sodium, cholesterol and nutrients.;Long Term Goal: Adherence to prescribed nutrition plan.           Nutrition Assessments: Nutrition Assessments - 05/28/17 0930    MEDFICTS Scores          Pre Score  66           Nutrition Goals Re-Evaluation:   Nutrition Goals Re-Evaluation:   Nutrition Goals Discharge (Final Nutrition Goals Re-Evaluation):   Psychosocial: Target Goals: Acknowledge presence or absence of significant depression and/or stress, maximize coping skills, provide positive support system. Participant is able to verbalize types and ability to use  techniques and skills needed for reducing stress and depression.  Initial Review & Psychosocial Screening: Initial Psych Review & Screening - 05/28/17 1200    Initial Review          Current issues with  None Identified        Family Dynamics          Good Support System?  Yes Durwood has his wife for support        Barriers          Psychosocial barriers to participate in program  There are no identifiable barriers or psychosocial needs.        Screening Interventions          Interventions  Encouraged to exercise           Quality of Life Scores: Quality of Life - 06/12/17 1156    Quality of Life Scores          Health/Function Pre  27.9 %    Socioeconomic Pre  27.43 %    Psych/Spiritual Pre  30 %    Family Pre  27.6 %    GLOBAL Pre  28.19 % QOL scores reviewed with pt. overall pt displays positive outlook with good coping skills. pt offered emotional support and reassurance.            Scores of 19 and below usually indicate a poorer quality of life in these areas.  A difference of  2-3 points is a clinically meaningful difference.  A difference of 2-3 points in the total score of the Quality of Life  Index has been associated with significant improvement in overall quality of life, self-image, physical symptoms, and general health in studies assessing change in quality of life.  PHQ-9: Recent Review Flowsheet Data    Depression screen [facility] 2/9 06/07/2017   Decreased Interest 0   Down, Depressed, Hopeless 0   PHQ - 2 Score 0     Interpretation of Total Score  Total Score Depression Severity:  1-4 = Minimal depression, 5-9 = Mild depression, 10-14 = Moderate depression, 15-19 = Moderately severe depression, 20-27 = Severe depression   Psychosocial Evaluation and Intervention: Psychosocial Evaluation - 06/10/17 1529    Psychosocial Evaluation & Interventions          Interventions  Encouraged to exercise with the program and follow exercise prescription     Comments  no psychosocial needs identified, no interventions necessary     Expected Outcomes  pt will exhibit positive outlook with good coping skills.     Continue Psychosocial Services   No Follow up required           Psychosocial Re-Evaluation: Psychosocial Re-Evaluation    Psychosocial Re-Evaluation    [place] Name 06/12/17 1638 07/10/17 1200 08/02/17 1203   Current issues with  None Identified  None Identified  None Identified   Comments  no psychosocial needs identified, no interventions necessary   no psychosocial needs identified, no interventions necessary   no psychosocial needs identified, no interventions necessary    Expected Outcomes  pt will exhibit positive outlooks with good coping skils.   pt will exhibit positive outlooks with good coping skils.   pt will exhibit positive outlooks with good coping skils.    Interventions  Encouraged to attend Cardiac Rehabilitation for the exercise  Encouraged to attend Cardiac Rehabilitation for the exercise  Encouraged to attend Cardiac Rehabilitation for the exercise   Continue Psychosocial Services   No Follow up required  No Follow up required  No Follow up required          Psychosocial Discharge (Final Psychosocial Re-Evaluation): Psychosocial Re-Evaluation - 08/02/17 1203    Psychosocial Re-Evaluation          Current issues with  None Identified    Comments  no psychosocial needs identified, no interventions necessary     Expected Outcomes  pt will exhibit positive outlooks with good coping skils.     Interventions  Encouraged to attend Cardiac Rehabilitation for the exercise    Continue Psychosocial Services   No Follow up required           Vocational Rehabilitation: Provide vocational rehab assistance to qualifying candidates.   Vocational Rehab Evaluation & Intervention: Vocational Rehab - 05/28/17 1200    Initial Vocational Rehab Evaluation & Intervention          Assessment shows need for Vocational  Rehabilitation  No Creig is retired and does not need vocational rehab at this time           Education: Education Goals: Education classes will be provided on a weekly basis, covering required topics. Participant will state understanding/return demonstration of topics presented.  Learning Barriers/Preferences: Learning Barriers/Preferences - 05/28/17 1610    Learning Barriers/Preferences          Learning Barriers  Sight    Learning Preferences  Video;Pictoral           Education Topics: Count Your Pulse:  -Group instruction provided by verbal instruction, demonstration, patient participation and written materials to support subject.  Instructors address importance  of being able to find your pulse and how to count your pulse when at home without a heart monitor.  Patients get hands on experience counting their pulse with staff help and individually. Flowsheet Row CARDIAC REHAB PHASE II EXERCISE from 08/07/2017 in [place]  Date  07/05/17  Educator  RN  Instruction Review Code  2- Demonstrated Understanding      Heart Attack, Angina, and Risk Factor Modification:  -Group instruction provided by verbal instruction, video, and written materials to support subject.  Instructors address signs and symptoms of angina and heart attacks.    Also discuss risk factors for heart disease and how to make changes to improve heart health risk factors.   Functional Fitness:  -Group instruction provided by verbal instruction, demonstration, patient participation, and written materials to support subject.  Instructors address safety measures for doing things around the house.  Discuss how to get up and down off the floor, how to pick things up properly, how to safely get out of a chair without assistance, and balance training. Flowsheet Row CARDIAC REHAB PHASE II EXERCISE from 08/07/2017 in [place]  Date  07/19/17  Educator  EP   Instruction Review Code  2- Demonstrated Understanding      Meditation and Mindfulness:  -Group instruction provided by verbal instruction, patient participation, and written materials to support subject.  Instructor addresses importance of mindfulness and meditation practice to help reduce stress and improve awareness.  Instructor also leads participants through a meditation exercise.  Flowsheet Row CARDIAC REHAB PHASE II EXERCISE from 08/07/2017 in [place]  Date  08/07/17  Instruction Review Code  2- Demonstrated Understanding      Stretching for Flexibility and Mobility:  -Group instruction provided by verbal instruction, patient participation, and written materials to support subject.  Instructors lead participants through series of stretches that are designed to increase flexibility thus improving mobility.  These stretches are additional exercise for major muscle groups that are typically performed during regular warm up and cool down.   Hands Only CPR:  -Group verbal, video, and participation provides a basic overview of AHA guidelines for community CPR. Role-play of emergencies allow participants the opportunity to practice calling for help and chest compression technique with discussion of AED use.   Hypertension: -Group verbal and written instruction that provides a basic overview of hypertension including the most recent diagnostic guidelines, risk factor reduction with self-care instructions and medication management. Flowsheet Row CARDIAC REHAB PHASE II EXERCISE from 08/07/2017 in [place]  Date  07/26/17  Educator  RN  Instruction Review Code  2- Demonstrated Understanding       Nutrition I class: Heart Healthy Eating:  -Group instruction provided by PowerPoint slides, verbal discussion, and written materials to support subject matter. The instructor gives an explanation and review of the Therapeutic  Lifestyle Changes diet recommendations, which includes a discussion on lipid goals, dietary fat, sodium, fiber, plant stanol/sterol esters, sugar, and the components of a well-balanced, healthy diet.   Nutrition II class: Lifestyle Skills:  -Group instruction provided by PowerPoint slides, verbal discussion, and written materials to support subject matter. The instructor gives an explanation and review of label reading, grocery shopping for heart health, heart healthy recipe modifications, and ways to make healthier choices when eating out.   Diabetes Question & Answer:  -Group instruction provided by PowerPoint slides, verbal discussion, and written materials to support subject  matter. The instructor gives an explanation and review of diabetes co-morbidities, pre- and post-prandial blood glucose goals, pre-exercise blood glucose goals, signs, symptoms, and treatment of hypoglycemia and hyperglycemia, and foot care basics. Flowsheet Row CARDIAC REHAB PHASE II EXERCISE from 08/07/2017 in [place]  Date  08/02/17  Educator  RD  Instruction Review Code  2- Demonstrated Understanding      Diabetes Blitz:  -Group instruction provided by PowerPoint slides, verbal discussion, and written materials to support subject matter. The instructor gives an explanation and review of the physiology behind type 1 and type 2 diabetes, diabetes medications and rational behind using different medications, pre- and post-prandial blood glucose recommendations and Hemoglobin A1c goals, diabetes diet, and exercise including blood glucose guidelines for exercising safely.    Portion Distortion:  -Group instruction provided by PowerPoint slides, verbal discussion, written materials, and food models to support subject matter. The instructor gives an explanation of serving size versus portion size, changes in portions sizes over the last 20 years, and what consists of a serving from each food  group. Flowsheet Row CARDIAC REHAB PHASE II EXERCISE from 08/07/2017 in [place]  Date  06/19/17  Educator  RD  Instruction Review Code  2- Demonstrated Understanding      Stress Management:  -Group instruction provided by verbal instruction, video, and written materials to support subject matter.  Instructors review role of stress in heart disease and how to cope with stress positively.   Flowsheet Row CARDIAC REHAB PHASE II EXERCISE from 08/07/2017 in [place]  Date  06/26/17  Instruction Review Code  2- Demonstrated Understanding      Exercising on Your Own:  -Group instruction provided by verbal instruction, power point, and written materials to support subject.  Instructors discuss benefits of exercise, components of exercise, frequency and intensity of exercise, and end points for exercise.  Also discuss use of nitroglycerin and activating EMS.  Review options of places to exercise outside of rehab.  Review guidelines for sex with heart disease. Flowsheet Row CARDIAC REHAB PHASE II EXERCISE from 08/07/2017 in [place]  Date  07/24/17  Educator  EP  Instruction Review Code  2- Demonstrated Understanding      Cardiac Drugs I:  -Group instruction provided by verbal instruction and written materials to support subject.  Instructor reviews cardiac drug classes: antiplatelets, anticoagulants, beta blockers, and statins.  Instructor discusses reasons, side effects, and lifestyle considerations for each drug class. Flowsheet Row CARDIAC REHAB PHASE II EXERCISE from 08/07/2017 in [place]  Date  07/31/17  Instruction Review Code  2- Demonstrated Understanding      Cardiac Drugs II:  -Group instruction provided by verbal instruction and written materials to support subject.  Instructor reviews cardiac drug classes: angiotensin converting enzyme inhibitors  (ACE-I), angiotensin II receptor blockers (ARBs), nitrates, and calcium channel blockers.  Instructor discusses reasons, side effects, and lifestyle considerations for each drug class. Flowsheet Row CARDIAC REHAB PHASE II EXERCISE from 08/07/2017 in [place]  Date  07/03/17  Educator  -- [pharmacist]  Instruction Review Code  2- Demonstrated Understanding      Anatomy and Physiology of the Circulatory System:  Group verbal and written instruction and models provide basic cardiac anatomy and physiology, with the coronary electrical and arterial systems. Review of: AMI, Angina, Valve disease, Heart Failure, Peripheral Artery Disease, Cardiac Arrhythmia, Pacemakers,  and the ICD. Flowsheet Row CARDIAC REHAB PHASE II EXERCISE from 08/07/2017 in [place]  Date  07/10/17  Instruction Review Code  2- Demonstrated Understanding      Other Education:  -Group or individual verbal, written, or video instructions that support the educational goals of the cardiac rehab program.   Holiday Eating Survival Tips:  -Group instruction provided by PowerPoint slides, verbal discussion, and written materials to support subject matter. The instructor gives patients tips, tricks, and techniques to help them not only survive but enjoy the holidays despite the onslaught of food that accompanies the holidays.   Knowledge Questionnaire Score: Knowledge Questionnaire Score - 05/28/17 1113    Knowledge Questionnaire Score          Pre Score  22/24           Core Components/Risk Factors/Patient Goals at Admission: Personal Goals and Risk Factors at Admission - 05/28/17 1105    Core Components/Risk Factors/Patient Goals on Admission           Weight Management  Yes;Obesity    Intervention  Weight Management/Obesity: Establish reasonable short term and long term weight goals.;Obesity: Provide education and appropriate resources to help  participant work on and attain dietary goals.    Admit Weight  215 lb 2.7 oz (97.6 kg)    Goal Weight: Short Term  209 lb 3.2 oz (94.9 kg)    Goal Weight: Long Term  195 lb 3.2 oz (88.5 kg)    Expected Outcomes  Short Term: Continue to assess and modify interventions until short term weight is achieved;Long Term: Adherence to nutrition and physical activity/exercise program aimed toward attainment of established weight goal;Weight Loss: Understanding of general recommendations for a balanced deficit meal plan, which promotes 1-2 lb weight loss per week and includes a negative energy balance of [phone] kcal/d;Understanding of distribution of calorie intake throughout the day with the consumption of 4-5 meals/snacks;Understanding recommendations for meals to include 15-35% energy as protein, 25-35% energy from fat, 35-60% energy from carbohydrates, less than 200mg  of dietary cholesterol, 20-35 gm of total fiber daily    Hypertension  Yes    Intervention  Provide education on lifestyle modifcations including regular physical activity/exercise, weight management, moderate sodium restriction and increased consumption of fresh fruit, vegetables, and low fat dairy, alcohol moderation, and smoking cessation.;Monitor prescription use compliance.    Expected Outcomes  Short Term: Continued assessment and intervention until BP is < 140/70mm HG in hypertensive participants. < 130/76mm HG in hypertensive participants with diabetes, heart failure or chronic kidney disease.;Long Term: Maintenance of blood pressure at goal levels.    Lipids  Yes    Intervention  Provide education and support for participant on nutrition & aerobic/resistive exercise along with prescribed medications to achieve LDL 70mg , HDL >40mg .    Expected Outcomes  Short Term: Participant states understanding of desired cholesterol values and is compliant with medications prescribed. Participant is following exercise prescription and nutrition  guidelines.;Long Term: Cholesterol controlled with medications as prescribed, with individualized exercise RX and with personalized nutrition plan. Value goals: LDL < 70mg , HDL > 40 mg.           Core Components/Risk Factors/Patient Goals Review:  Goals and Risk Factor Review    Core Components/Risk Factors/Patient Goals Review    Row Name 06/07/17 1114 06/10/17 1529 07/10/17 1155 08/02/17 1202   Personal Goals Review  Weight Management/Obesity;Hypertension;Lipids;Diabetes  Weight Management/Obesity;Hypertension;Lipids;Diabetes  Weight Management/Obesity;Hypertension;Lipids;Diabetes  Weight Management/Obesity;Hypertension;Lipids;Diabetes   Review  pt  with multiple CAD RF demonstrates eagerness to participate in CR program.  pt completed program 10 years ago and is interested in re-establishing home exercise program to reduce disease progression.  pt with multiple CAD RF demonstrates eagerness to participate in CR program.  pt completed program 10 years ago and is interested in re-establishing home exercise program to reduce disease progression.  pt with multiple CAD RF demonstrates eagerness to participate in CR program.  pt completed program 10 years ago.  pt has not restarted HEP however remains "active".  pt does c/o occasional fatigue with golf.    pt with multiple CAD RF demonstrates eagerness to participate in CR program. pt recently started CBD oil without significant improvement in joint discomfort.   pt has not restarted HEP however remains "active".  pt does c/o occasional fatigue with golf.  pt independant with his diabetes self care regimen.  pt aware of need to lose weight.      Expected Outcomes  pt will participate in CR exercise, nutrition and lifestyle modification opportunties to decrease overall RF.    pt will participate in CR exercise, nutrition and lifestyle modification opportunties to decrease overall RF.    pt will participate in CR exercise, nutrition and lifestyle modification  opportunties to decrease overall RF.    pt will participate in CR exercise, nutrition and lifestyle modification opportunties to decrease overall RF.            Core Components/Risk Factors/Patient Goals at Discharge (Final Review):  Goals and Risk Factor Review - 08/02/17 1202    Core Components/Risk Factors/Patient Goals Review          Personal Goals Review  Weight Management/Obesity;Hypertension;Lipids;Diabetes    Review  pt with multiple CAD RF demonstrates eagerness to participate in CR program. pt recently started CBD oil without significant improvement in joint discomfort.   pt has not restarted HEP however remains "active".  pt does c/o occasional fatigue with golf.  pt independant with his diabetes self care regimen.  pt aware of need to lose weight.       Expected Outcomes  pt will participate in CR exercise, nutrition and lifestyle modification opportunties to decrease overall RF.             ITP Comments: ITP Comments    Row Name 05/28/17 3762 06/07/17 1111 06/10/17 1529 07/10/17 1155 08/02/17 1201   ITP Comments  Medical Director, Dr. Fransico Him, MD  pt started group exercise sessions. pt tolerated light activity without difficulty. pt demonstrates eagerness to participate in CR program  30 day ITP.  pt with good attendance and participation.   30 day ITP.  pt with good attendance and participation.   30 day ITP.  pt with good attendance and participation.       Comments:

## 2017-08-09 ENCOUNTER — Encounter (HOSPITAL_COMMUNITY): Payer: Medicare Other

## 2017-08-09 ENCOUNTER — Encounter (HOSPITAL_COMMUNITY)
Admission: RE | Admit: 2017-08-09 | Discharge: 2017-08-09 | Disposition: A | Discharge status: Home / Self Care | Payer: Medicare Other | Source: Ambulatory Visit | Attending: Cardiovascular Disease | Admitting: Cardiovascular Disease

## 2017-08-09 DIAGNOSIS — E119 Type 2 diabetes mellitus without complications: Secondary | ICD-10-CM | POA: Diagnosis not present

## 2017-08-09 DIAGNOSIS — I10 Essential (primary) hypertension: Secondary | ICD-10-CM | POA: Diagnosis not present

## 2017-08-09 DIAGNOSIS — G4733 Obstructive sleep apnea (adult) (pediatric): Secondary | ICD-10-CM | POA: Diagnosis not present

## 2017-08-09 DIAGNOSIS — E785 Hyperlipidemia, unspecified: Secondary | ICD-10-CM | POA: Diagnosis not present

## 2017-08-09 DIAGNOSIS — I214 Non-ST elevation (NSTEMI) myocardial infarction: Secondary | ICD-10-CM | POA: Diagnosis not present

## 2017-08-09 DIAGNOSIS — Z955 Presence of coronary angioplasty implant and graft: Secondary | ICD-10-CM | POA: Diagnosis not present

## 2017-08-12 ENCOUNTER — Encounter (HOSPITAL_COMMUNITY): Payer: Medicare Other

## 2017-08-12 ENCOUNTER — Encounter (HOSPITAL_COMMUNITY)
Admission: RE | Admit: 2017-08-12 | Discharge: 2017-08-12 | Disposition: A | Discharge status: Home / Self Care | Payer: Medicare Other | Source: Ambulatory Visit | Attending: Cardiovascular Disease | Admitting: Cardiovascular Disease

## 2017-08-12 DIAGNOSIS — Z955 Presence of coronary angioplasty implant and graft: Secondary | ICD-10-CM | POA: Diagnosis not present

## 2017-08-12 DIAGNOSIS — E785 Hyperlipidemia, unspecified: Secondary | ICD-10-CM | POA: Diagnosis not present

## 2017-08-12 DIAGNOSIS — I10 Essential (primary) hypertension: Secondary | ICD-10-CM | POA: Diagnosis not present

## 2017-08-12 DIAGNOSIS — I214 Non-ST elevation (NSTEMI) myocardial infarction: Secondary | ICD-10-CM | POA: Diagnosis not present

## 2017-08-12 DIAGNOSIS — E119 Type 2 diabetes mellitus without complications: Secondary | ICD-10-CM | POA: Diagnosis not present

## 2017-08-12 DIAGNOSIS — G4733 Obstructive sleep apnea (adult) (pediatric): Secondary | ICD-10-CM | POA: Diagnosis not present

## 2017-08-13 DIAGNOSIS — G4733 Obstructive sleep apnea (adult) (pediatric): Secondary | ICD-10-CM | POA: Diagnosis not present

## 2017-08-13 DIAGNOSIS — E1169 Type 2 diabetes mellitus with other specified complication: Secondary | ICD-10-CM | POA: Diagnosis not present

## 2017-08-14 ENCOUNTER — Encounter (HOSPITAL_COMMUNITY): Payer: Medicare Other

## 2017-08-14 ENCOUNTER — Encounter (HOSPITAL_COMMUNITY)
Admission: RE | Admit: 2017-08-14 | Discharge: 2017-08-14 | Disposition: A | Discharge status: Home / Self Care | Payer: Medicare Other | Source: Ambulatory Visit | Attending: Cardiovascular Disease | Admitting: Cardiovascular Disease

## 2017-08-14 DIAGNOSIS — G4733 Obstructive sleep apnea (adult) (pediatric): Secondary | ICD-10-CM | POA: Diagnosis not present

## 2017-08-14 DIAGNOSIS — Z955 Presence of coronary angioplasty implant and graft: Secondary | ICD-10-CM | POA: Diagnosis not present

## 2017-08-14 DIAGNOSIS — E785 Hyperlipidemia, unspecified: Secondary | ICD-10-CM | POA: Diagnosis not present

## 2017-08-14 DIAGNOSIS — E119 Type 2 diabetes mellitus without complications: Secondary | ICD-10-CM | POA: Diagnosis not present

## 2017-08-14 DIAGNOSIS — I10 Essential (primary) hypertension: Secondary | ICD-10-CM | POA: Diagnosis not present

## 2017-08-14 DIAGNOSIS — I214 Non-ST elevation (NSTEMI) myocardial infarction: Secondary | ICD-10-CM | POA: Diagnosis not present

## 2017-08-16 ENCOUNTER — Encounter (HOSPITAL_COMMUNITY): Payer: Medicare Other

## 2017-08-16 ENCOUNTER — Encounter (HOSPITAL_COMMUNITY)
Admission: RE | Admit: 2017-08-16 | Discharge: 2017-08-16 | Disposition: A | Discharge status: Home / Self Care | Payer: Medicare Other | Source: Ambulatory Visit | Attending: Cardiovascular Disease | Admitting: Cardiovascular Disease

## 2017-08-16 DIAGNOSIS — I10 Essential (primary) hypertension: Secondary | ICD-10-CM | POA: Diagnosis not present

## 2017-08-16 DIAGNOSIS — I214 Non-ST elevation (NSTEMI) myocardial infarction: Secondary | ICD-10-CM | POA: Diagnosis not present

## 2017-08-16 DIAGNOSIS — Z955 Presence of coronary angioplasty implant and graft: Secondary | ICD-10-CM

## 2017-08-16 DIAGNOSIS — N451 Epididymitis: Secondary | ICD-10-CM | POA: Diagnosis not present

## 2017-08-16 DIAGNOSIS — G4733 Obstructive sleep apnea (adult) (pediatric): Secondary | ICD-10-CM | POA: Diagnosis not present

## 2017-08-16 DIAGNOSIS — E785 Hyperlipidemia, unspecified: Secondary | ICD-10-CM | POA: Diagnosis not present

## 2017-08-16 DIAGNOSIS — E119 Type 2 diabetes mellitus without complications: Secondary | ICD-10-CM | POA: Diagnosis not present

## 2017-08-21 ENCOUNTER — Encounter (HOSPITAL_COMMUNITY): Payer: Medicare Other

## 2017-08-21 ENCOUNTER — Emergency Department (HOSPITAL_COMMUNITY): Payer: Medicare Other

## 2017-08-21 ENCOUNTER — Emergency Department (HOSPITAL_COMMUNITY)
Admission: EM | Admit: 2017-08-21 | Discharge: 2017-08-21 | Disposition: A | Discharge status: Home / Self Care | Payer: Medicare Other | Attending: Emergency Medicine | Admitting: Emergency Medicine

## 2017-08-21 ENCOUNTER — Other Ambulatory Visit: Payer: Self-pay

## 2017-08-21 DIAGNOSIS — R079 Chest pain, unspecified: Secondary | ICD-10-CM | POA: Diagnosis not present

## 2017-08-21 DIAGNOSIS — M79601 Pain in right arm: Secondary | ICD-10-CM | POA: Diagnosis not present

## 2017-08-21 DIAGNOSIS — Z79899 Other long term (current) drug therapy: Secondary | ICD-10-CM | POA: Diagnosis not present

## 2017-08-21 DIAGNOSIS — I10 Essential (primary) hypertension: Secondary | ICD-10-CM | POA: Insufficient documentation

## 2017-08-21 DIAGNOSIS — Z7982 Long term (current) use of aspirin: Secondary | ICD-10-CM | POA: Diagnosis not present

## 2017-08-21 DIAGNOSIS — Z91010 Allergy to peanuts: Secondary | ICD-10-CM | POA: Insufficient documentation

## 2017-08-21 DIAGNOSIS — Z87891 Personal history of nicotine dependence: Secondary | ICD-10-CM | POA: Diagnosis not present

## 2017-08-21 DIAGNOSIS — E119 Type 2 diabetes mellitus without complications: Secondary | ICD-10-CM | POA: Insufficient documentation

## 2017-08-21 DIAGNOSIS — I2510 Atherosclerotic heart disease of native coronary artery without angina pectoris: Secondary | ICD-10-CM | POA: Insufficient documentation

## 2017-08-21 DIAGNOSIS — R918 Other nonspecific abnormal finding of lung field: Secondary | ICD-10-CM | POA: Diagnosis not present

## 2017-08-21 LAB — BASIC METABOLIC PANEL
ANION GAP: 10 (ref 5–15)
BUN: 13 mg/dL (ref 6–20)
CALCIUM: 9.1 mg/dL (ref 8.9–10.3)
CO2: 27 mmol/L (ref 22–32)
Chloride: 102 mmol/L (ref 101–111)
Creatinine, Ser: 0.91 mg/dL (ref 0.61–1.24)
GFR calc Af Amer: >60 mL/min (ref >60)
GLUCOSE: 126 mg/dL — AB (ref 65–99)
POTASSIUM: 4.5 mmol/L (ref 3.5–5.1)
SODIUM: 139 mmol/L (ref 135–145)

## 2017-08-21 LAB — I-STAT TROPONIN, ED
TROPONIN I, POC: 0 ng/mL (ref 0.00–0.08)
Troponin i, poc: 0.01 ng/mL (ref 0.00–0.08)

## 2017-08-21 LAB — CBC
HEMATOCRIT: 43.2 % (ref 39.0–52.0)
HEMOGLOBIN: 14.7 g/dL (ref 13.0–17.0)
MCH: 32.3 pg (ref 26.0–34.0)
MCHC: 34 g/dL (ref 30.0–36.0)
MCV: 94.9 fL (ref 78.0–100.0)
Platelets: 201 10*3/uL (ref 150–400)
RBC: 4.55 MIL/uL (ref 4.22–5.81)
RDW: 12.8 % (ref 11.5–15.5)
WBC: 7.9 10*3/uL (ref 4.0–10.5)

## 2017-08-21 LAB — D-DIMER, QUANTITATIVE (NOT AT ARMC): D DIMER QUANT: 0.27 ug{FEU}/mL (ref 0.00–0.50)

## 2017-08-21 MED ORDER — RAMIPRIL 10 MG PO CAPS: 10.0000 mg | ORAL_CAPSULE | Freq: Every day | ORAL | 0 refills | Status: DC

## 2017-08-21 MED ORDER — RAMIPRIL 2.5 MG PO CAPS
2.5000 mg | ORAL_CAPSULE | Freq: Every day | ORAL | Status: DC
  Filled 2017-08-21: qty 1

## 2017-08-21 NOTE — Discharge Instructions (Signed)
Please see the information and instructions below regarding your visit.  Your diagnoses today include:  1. Right arm pain   2. Elevated blood pressure reading with diagnosis of hypertension    Your work-up is very reassuring today.  2 troponin tests were negative for you today.  Her chest x-ray did show that area in the right lung that is possibly pneumonia versus some other hazy area.  I spoke with the radiologist regarding this, and he recommends you complete your doxycycline, and get a recheck in 3 to 4 weeks to make sure it is not changing.  Tests performed today include: See side panel of your discharge paperwork for testing performed today. Vital signs are listed at the bottom of these instructions.   Medications prescribed:    Take any prescribed medications only as prescribed, and any over the counter medications only as directed on the packaging.  You update your blood pressure medications to 10 mg daily.  Home care instructions:  Please follow any educational materials contained in this packet.   Follow-up instructions: Please follow-up with your primary care provider in 3-4 weeks for further evaluation of your symptoms if they are not completely improved.   Please follow up with Dr. [name] of cardiology for a blood pressure recheck.  Return instructions:  Please return to the Emergency Department if you experience worsening symptoms.  Please return to the emergency department immediately if you develop any symptoms that felt like her prior heart attacks with arm pain, shortness of breath, chest pain, feeling that you are going to pass out, swelling in your extremities, redness in extremities. Please return if you have any other emergent concerns.  Additional Information:   Your vital signs today were: BP (!) 177/70    Pulse 62    Temp 97.8 F (36.6 C) (Oral)    Resp 16    SpO2 98%  If your blood pressure (BP) was elevated on multiple readings during this visit above 130  for the top number or above 80 for the bottom number, please have this repeated by your primary care provider within one month. --------------  Thank you for allowing [place] to participate in your care today.

## 2017-08-21 NOTE — ED Notes (Signed)
Rn called lab and they are adding on dimer now

## 2017-08-21 NOTE — ED Provider Notes (Signed)
Medical screening examination/treatment/procedure(s) were conducted as a shared visit with non-physician practitioner(s) and myself.  I personally evaluated the patient during the encounter.  R arm pain that was brief and associated with hypertension. Both resolved now D dimer negative, normal pulses, no neuro changes. Low suspicion for PE/aortic dissection. Ecg/troponin ok, doubt ACS. Unclear etiology at this point but BP improved, pain free and stable for discharge.   EKG Interpretation  Date/Time:  Wednesday Aug 21 2017 01:13:37 EDT Ventricular Rate:  78 PR Interval:  178 QRS Duration: 86 QT Interval:  386 QTC Calculation: 440 R Axis:   32 Text Interpretation:  . Normal sinus rhythm Normal ECG No significant change since last tracing Confirmed by Merrily Pew [phone] on 08/21/2017 11:23:22 AM     Vimal Derego, Corene Cornea, MD 08/22/17 [phone]

## 2017-08-21 NOTE — ED Notes (Signed)
Patient transported to X-ray 

## 2017-08-21 NOTE — ED Triage Notes (Signed)
Pt was at rest tonight at home and began having arm pain on the right. States that he had an NSTEMI with stent placement previously that had presented as only arm pain on the left.  Pt denies any shob, n/v, diaphoresis. Pt took 2 nitro with no relief in arm pain. Pt did take his blood pressure at home several times with increases each time.

## 2017-08-21 NOTE — ED Provider Notes (Signed)
[facility] EMERGENCY DEPARTMENT Provider Note   CSN: [id number] Arrival date & time: 08/21/17  0107     History   Chief Complaint Chief Complaint  Patient presents with  . Chest Pain    HPI Justin Carr is a 72 y.o. male.  HPI  Patient is a 57-year male with a history of coronary artery disease, hyperlipidemia, hypertension, non-STEMI, status post 2 stents, most recently in January 2019 with DES to the LAD, type II days mellitus presenting for right arm pain.  Patient reports that he began experiencing arm pain last night before bed, and it persisted into the evening.  Patient reports he took his blood pressure at home, denies blood pressure recorded was 190/115.  Patient reports that this is far higher than his typical blood pressure.  Patient takes ramipril once daily in the morning.  Patient denying any chest pain, shortness of breath, nausea, vomiting, diaphoresis, dizziness or lightheadedness or presyncope.  Patient denies any trauma to the arm, redness or swelling of the arm, or decreased sensation in the arm or weakness.  Patient is currently in cardiac rehab, and reports he has been performing his rehabilitation exercises without any difficulty.  Patient is followed by Dr. [name] of cardiology.  Past Medical History:  Diagnosis Date  . Coronary artery disease   . GERD (gastroesophageal reflux disease)   . Hyperlipidemia   . Hypertension   . NSTEMI (non-ST elevated myocardial infarction) ([place]) 06/2007   [name] 07/27/2010  . OSA on CPAP   . Palpitations   . Pneumonia ~ 1971 X 1  . Type II diabetes mellitus [facility])     Patient Active Problem List   Diagnosis Date Noted  . CAD (coronary artery disease) 04/11/2017  . Stable angina (HCC)   . Abnormal nuclear stress test   . Diabetic neuropathy ([place]) 04/20/2015  . Paresthesia 03/23/2015  . Palpitations 03/24/2014  . Coronary artery disease 04/21/2013  . Essential hypertension 04/21/2013  . Hyperlipidemia  04/21/2013  . Diabetes ([place]) 04/21/2013  . Obstructive sleep apnea 04/21/2013  . Renal artery stenosis ([place]) 04/21/2013    Past Surgical History:  Procedure Laterality Date  . CARDIOVASCULAR STRESS TEST  10/06/2007   No scintigraphic evidence of inducible myocardial ischemia. ECG positive for ischemia. Pharmacologically induced ST segment depression. Perfusion defect seen in inferior myocardial region consistent with diaphragmatic attenuation.  . CORONARY ANGIOPLASTY WITH STENT PLACEMENT  07/03/2007   LAD 90% ulcerated plaque proximal between 1st and 2nd branches stented with a 2.75x76mm Taxus Liberte stent deployed at 16atm. Postdilated at 16atm (3.80mm). resulting in reduction of 90% lesion to 0% residual with TIMI3 flow. Impingement on theostium of diag branch dilatedat 6atm resulting in reduction of 90% ostial D2 to <50% residual.  . CORONARY ANGIOPLASTY WITH STENT PLACEMENT  04/11/2017  . LEFT HEART CATH AND CORONARY ANGIOGRAPHY N/A 04/11/2017   Procedure: LEFT HEART CATH AND CORONARY ANGIOGRAPHY;  Surgeon: [name], MD;  Location: [facility] CV LAB;  Service: Cardiovascular;  Laterality: N/A;  . LIGAMENT REPAIR Right 03/2001   wrist  . RENAL ARTERIAL DOPPLER  0/86/5784   SMA and Cephalic artery >69% diameter reduction, Rt prox Renal artery 60-99% diameter reduction, Lft prox Renal artery 1-59% diameter reduction, kidneys are normal in size.  Marland Kitchen SHOULDER ARTHROSCOPY WITH SUBACROMIAL DECOMPRESSION Right 04/03/2013   Procedure: RIGHT SHOULDER ARTHROSCOPY WITH SUBACROMIAL DECOMPRESSION/DISTAL CLAVICLE RESECTION;  Surgeon: Marin Shutter, MD;  Location: [facility];  Service: Orthopedics;  Laterality: Right;  . TONSILLECTOMY  AND ADENOIDECTOMY  ~ 1952  . TRANSTHORACIC ECHOCARDIOGRAM  10/06/2007   EF 63%, normal.        Home Medications    Prior to Admission medications   Medication Sig Start Date End Date Taking? Authorizing Provider  ALPHA-LIPOIC ACID PO Take 1 tablet by mouth daily.     [provider]  aspirin 81 MG tablet Take 1 tablet (81 mg total) by mouth daily. 04/12/17   Leanor Kail, PA  clopidogrel (PLAVIX) 75 MG tablet Take 1 tablet (75 mg total) by mouth daily. 03/27/17   [name], MD  Coenzyme Q10 (CO Q 10 PO) Take 1 capsule by mouth daily.     [provider]  Cyanocobalamin (B-12) 2500 MCG TABS Take 2,500 mcg by mouth daily.     [provider]  gabapentin (NEURONTIN) 300 MG capsule Take 1 capsule (300 mg total) by mouth 3 (three) times daily. 06/11/17   Wallene Huh, DPM  metFORMIN (GLUCOPHAGE) 1000 MG tablet Take 1,000 mg by mouth 2 (two) times daily with a meal.    [provider]  Multiple Vitamin (MULTIVITAMIN WITH MINERALS) TABS tablet Take 1 tablet by mouth daily.    [provider]  Multiple Vitamins-Minerals (PRESERVISION AREDS PO) Take 1 capsule by mouth daily.     [provider]  nitroGLYCERIN (NITROSTAT) 0.4 MG SL tablet Place 1 tablet (0.4 mg total) under the tongue every 5 (five) minutes as needed for chest pain. 03/27/17   [name], MD  ramipril (ALTACE) 2.5 MG capsule Take 1 capsule (2.5 mg total) by mouth daily. 03/27/17   [name], MD  ranitidine (ZANTAC) 150 MG tablet Take 1 tablet (150 mg total) by mouth 2 (two) times daily. 03/27/17   [name], MD  rosuvastatin (CRESTOR) 20 MG tablet Take 1 tablet (20 mg total) by mouth 3 (three) times a week. 03/27/17 06/25/17  [name], MD  sildenafil (REVATIO) 20 MG tablet Take 20-100 mg by mouth as needed (for erectile disfunction).     [provider]  sitaGLIPtin (JANUVIA) 50 MG tablet Take 50 mg by mouth daily.    [provider]    Family History Family History  Problem Relation Age of Onset  . Heart attack Maternal Grandfather   . Parkinson's disease Paternal Grandfather   . Heart disease Mother   . Arrhythmia Father   . Dementia Father     Social History Social History   Tobacco  Use  . Smoking status: Former Smoker    Packs/day: 1.00    Years: 30.00    Pack years: 30.00    Types: Cigarettes    Last attempt to quit: 07/03/2006    Years since quitting: 11.1  . Smokeless tobacco: Never Used  Substance Use Topics  . Alcohol use: Yes    Comment: 04/11/2017 "might have a beer q other week"  . Drug use: No     Allergies   Peanuts [peanut oil]; Lipitor [atorvastatin]; and Rosuvastatin   Review of Systems Review of Systems  Constitutional: Negative for chills and fever.  HENT: Negative for congestion and rhinorrhea.   Respiratory: Negative for cough and shortness of breath.   Cardiovascular: Negative for chest pain, palpitations and leg swelling.  Musculoskeletal: Positive for myalgias. Negative for arthralgias and joint swelling.  Neurological: Negative for dizziness, weakness, light-headedness and numbness.  All other systems reviewed and are negative.    Physical Exam Updated Vital Signs BP (!) 177/70  Pulse 62   Temp 97.8 F (36.6 C) (Oral)   Resp 16   SpO2 98%   Physical Exam  Constitutional: He appears well-developed and well-nourished. No distress.  HENT:  Head: Normocephalic and atraumatic.  Mouth/Throat: Oropharynx is clear and moist.  Eyes: Pupils are equal, round, and reactive to light. Conjunctivae and EOM are normal.  Neck: Normal range of motion. Neck supple.  Cardiovascular: Normal rate, regular rhythm, S1 normal and S2 normal.  No murmur heard. Pulses:      Radial pulses are 2+ on the right side, and 2+ on the left side.       Dorsalis pedis pulses are 1+ on the right side, and 2+ on the left side.  Pulmonary/Chest: Effort normal and breath sounds normal. He has no wheezes.  Coarse crackles in right lung base.  Abdominal: Soft. He exhibits no distension. There is no tenderness. There is no guarding.  Musculoskeletal: Normal range of motion. He exhibits no edema or deformity.  Lymphadenopathy:    He has no cervical adenopathy.    Neurological: He is alert.  Cranial nerves grossly intact. Patient moves extremities symmetrically and with good coordination.  Skin: Skin is warm and dry. No rash noted. No erythema.  Psychiatric: He has a normal mood and affect. His behavior is normal. Judgment and thought content normal.  Nursing note and vitals reviewed.    ED Treatments / Results  Labs (all labs ordered are listed, but only abnormal results are displayed) Labs Reviewed  BASIC METABOLIC PANEL - Abnormal; Notable for the following components:      Result Value   Glucose, Bld 126 (*)    All other components within normal limits  CBC  I-STAT TROPONIN, ED  I-STAT TROPONIN, ED    EKG EKG Interpretation  Date/Time:  Wednesday Aug 21 2017 01:13:37 EDT Ventricular Rate:  78 PR Interval:  178 QRS Duration: 86 QT Interval:  386 QTC Calculation: 440 R Axis:   32 Text Interpretation:  . Normal sinus rhythm Normal ECG No significant change since last tracing Confirmed by Merrily Pew [phone]) on 08/21/2017 11:23:22 AM   Radiology Dg Chest 2 View  Result Date: 08/21/2017 CLINICAL DATA:  Acute onset of right arm pain. EXAM: CHEST - 2 VIEW COMPARISON:  Chest radiograph performed 04/05/2017 FINDINGS: The lungs are well-aerated. Apparent right basilar airspace opacity may reflect pneumonia, depending on the patient's symptoms. There is no evidence of pleural effusion or pneumothorax. The heart is normal in size; the mediastinal contour is within normal limits. No acute osseous abnormalities are seen. IMPRESSION: Apparent right basilar airspace opacity may reflect pneumonia, depending on the patient's symptoms. Electronically Signed   By: Garald Balding M.D.   On: 08/21/2017 02:24    Procedures Procedures (including critical care time)  Medications Ordered in ED Medications - No data to display   Initial Impression / Assessment and Plan / ED Course  I have reviewed the triage vital signs and the nursing  notes.  Pertinent labs & imaging results that were available during my care of the patient were reviewed by me and considered in my medical decision making (see chart for details).     Patient is nontoxic-appearing and in no acute distress.  Patient is asymptomatic on my examination.  Patient continues to be hypertensive, but not as pronounced as his value of 190/115 at home.  Patient does have concerning features to his arm pain, as he reports it is similar to his prior ischemic pain that  he had with an STEMI, however he is never expands the pain in the right arm.  Patient has equal pulses in bilateral lower radial pulses, however he has a decreased right lower extremity dorsalis pedis pulse, and a well-perfused extremity.  Patient has signs of peripheral vascular disease with lack of hair growth in the tibia bilaterally.  Differential diagnosis includes hypertensive urgency/emergency, muscular skeletal pain.  Will consult cardiology given patient's high risk history.  Appreciate their involvement in the care of this patient.  Patient also with unclear right space opacity in the right lower lung field, without any pulmonary symptoms.  Patient is already treated on doxycycline for 5 days due to possible epididymitis.  Differential diagnosis for this includes pulmonary nodule, pulmonary infarction, pneumonia, atelectasis.  Given that patient has history of ischemic  cardiac pain referred to the left arm, entertaining pulmonary embolism as differential.  Will order d-dimer and while awaiting await cardiology recommendations.   Per recommendations of Dr. Irish Lack, patient's symptoms are nonspecific and given that he is asymptomatic with negative delta troponin, and EKG consistent with prior, provided he is cleared from a pulmonary work-up for pulmonary abnormality on chest x-ray, he is clear from cardiology standpoint.  Dr. Irish Lack recommends increasing ramipril to 10 mg daily.  Discussed establishing  follow-up with patient for blood pressure check with primary cardiologist.  D-dimer negative.  Per discussion with radiologist, Dr. Nyoka Cowden, recommend that patient have repeat chest radiograph in 3 to 4 weeks to determine clearance of opacity on chest x-ray.  Patient to complete course of doxycycline.  Patient was given return precautions for any symptoms similar to his cardiac event with persistent arm pain, chest pain, shortness of breath, dizziness or lightheadedness or presyncope.  Patient is in understanding and agrees with the plan of care.  This is a shared visit with Dr. Merrily Pew. Patient was independently evaluated by this attending physician. Attending physician consulted in evaluation and discharge management.  Final Clinical Impressions(s) / ED Diagnoses   Final diagnoses:  Right arm pain  Elevated blood pressure reading with diagnosis of hypertension    ED Discharge Orders        Ordered    ramipril (ALTACE) 10 MG capsule  Daily     08/21/17 1345       Tamala Julian 08/21/17 1837    Merrily Pew, MD 08/22/17 [phone]

## 2017-08-23 ENCOUNTER — Encounter (HOSPITAL_COMMUNITY)
Admission: RE | Admit: 2017-08-23 | Discharge: 2017-08-23 | Disposition: A | Discharge status: Home / Self Care | Payer: Medicare Other | Source: Ambulatory Visit | Attending: Cardiovascular Disease | Admitting: Cardiovascular Disease

## 2017-08-23 ENCOUNTER — Encounter (HOSPITAL_COMMUNITY): Payer: Medicare Other

## 2017-08-23 DIAGNOSIS — G4733 Obstructive sleep apnea (adult) (pediatric): Secondary | ICD-10-CM | POA: Diagnosis not present

## 2017-08-23 DIAGNOSIS — Z955 Presence of coronary angioplasty implant and graft: Secondary | ICD-10-CM | POA: Diagnosis not present

## 2017-08-23 DIAGNOSIS — E119 Type 2 diabetes mellitus without complications: Secondary | ICD-10-CM | POA: Diagnosis not present

## 2017-08-23 DIAGNOSIS — I10 Essential (primary) hypertension: Secondary | ICD-10-CM | POA: Diagnosis not present

## 2017-08-23 DIAGNOSIS — I214 Non-ST elevation (NSTEMI) myocardial infarction: Secondary | ICD-10-CM | POA: Diagnosis not present

## 2017-08-23 DIAGNOSIS — E785 Hyperlipidemia, unspecified: Secondary | ICD-10-CM | POA: Diagnosis not present

## 2017-08-26 ENCOUNTER — Encounter (HOSPITAL_COMMUNITY): Payer: Medicare Other

## 2017-08-26 ENCOUNTER — Encounter (HOSPITAL_COMMUNITY)
Admission: RE | Admit: 2017-08-26 | Discharge: 2017-08-26 | Disposition: A | Discharge status: Home / Self Care | Payer: Medicare Other | Source: Ambulatory Visit | Attending: Cardiovascular Disease | Admitting: Cardiovascular Disease

## 2017-08-26 VITALS — Ht 70.5 in | Wt 207.7 lb

## 2017-08-26 DIAGNOSIS — E785 Hyperlipidemia, unspecified: Secondary | ICD-10-CM | POA: Diagnosis not present

## 2017-08-26 DIAGNOSIS — I10 Essential (primary) hypertension: Secondary | ICD-10-CM | POA: Insufficient documentation

## 2017-08-26 DIAGNOSIS — E119 Type 2 diabetes mellitus without complications: Secondary | ICD-10-CM | POA: Diagnosis not present

## 2017-08-26 DIAGNOSIS — G4733 Obstructive sleep apnea (adult) (pediatric): Secondary | ICD-10-CM | POA: Insufficient documentation

## 2017-08-26 DIAGNOSIS — I214 Non-ST elevation (NSTEMI) myocardial infarction: Secondary | ICD-10-CM | POA: Diagnosis not present

## 2017-08-26 DIAGNOSIS — Z87891 Personal history of nicotine dependence: Secondary | ICD-10-CM | POA: Insufficient documentation

## 2017-08-26 DIAGNOSIS — Z79899 Other long term (current) drug therapy: Secondary | ICD-10-CM | POA: Diagnosis not present

## 2017-08-26 DIAGNOSIS — Z7982 Long term (current) use of aspirin: Secondary | ICD-10-CM | POA: Insufficient documentation

## 2017-08-26 DIAGNOSIS — Z955 Presence of coronary angioplasty implant and graft: Secondary | ICD-10-CM

## 2017-08-28 ENCOUNTER — Encounter (HOSPITAL_COMMUNITY): Payer: Medicare Other

## 2017-08-28 ENCOUNTER — Encounter (HOSPITAL_COMMUNITY)
Admission: RE | Admit: 2017-08-28 | Discharge: 2017-08-28 | Disposition: A | Discharge status: Home / Self Care | Payer: Medicare Other | Source: Ambulatory Visit | Attending: Cardiovascular Disease | Admitting: Cardiovascular Disease

## 2017-08-28 DIAGNOSIS — I10 Essential (primary) hypertension: Secondary | ICD-10-CM | POA: Diagnosis not present

## 2017-08-28 DIAGNOSIS — E119 Type 2 diabetes mellitus without complications: Secondary | ICD-10-CM | POA: Diagnosis not present

## 2017-08-28 DIAGNOSIS — Z955 Presence of coronary angioplasty implant and graft: Secondary | ICD-10-CM | POA: Diagnosis not present

## 2017-08-28 DIAGNOSIS — E785 Hyperlipidemia, unspecified: Secondary | ICD-10-CM | POA: Diagnosis not present

## 2017-08-28 DIAGNOSIS — G4733 Obstructive sleep apnea (adult) (pediatric): Secondary | ICD-10-CM | POA: Diagnosis not present

## 2017-08-28 DIAGNOSIS — I214 Non-ST elevation (NSTEMI) myocardial infarction: Secondary | ICD-10-CM | POA: Diagnosis not present

## 2017-08-28 NOTE — Progress Notes (Signed)
/  OUTPATIENT CARDIAC REHAB  PMH:  04/11/2017 DES LAD   Primary Cardiologist:  Dr. [name]  Pt concerned about recent hypertension at home. Pt with recent ED visit due to BP elevation (190/100) associated with right arm pain.  Pt reports arm pain was his anginal equivalent prior to his most recent stent placement. Pt was treated and released from ED.  Pt has upcoming cardiology office visit with Dr. [name].   Pt brought home BP cuff to rehab to calibration.   BP obtained bilaterally with staff and home monitors.  BP-manual- left arm:       128/58 BP home cuff- left arm-   149/75 BP manual right arm:      124/60 BP home cuff right arm:  140/78.  Pt reassured although there is a variance in readings, the home monitors seem to be consistent with one another.  There are consistent subtle variance between home and staff readings.    Pt advised to change batteries in home cuff to perhaps improve accuracy.  Pt also encouraged to continue to use same home cuff for consistency in readings.  Pt encouraged to continue current regimen and notify Dr. Kennon Holter office for questions or concerns.  Understanding verbalized. Andi Hence, RN, BSN Cardiac Pulmonary Rehab 08/28/17  12:39 PM

## 2017-08-30 ENCOUNTER — Encounter (HOSPITAL_COMMUNITY): Payer: Medicare Other

## 2017-09-02 ENCOUNTER — Encounter (HOSPITAL_COMMUNITY): Payer: Medicare Other

## 2017-09-02 ENCOUNTER — Encounter (HOSPITAL_COMMUNITY)
Admission: RE | Admit: 2017-09-02 | Discharge: 2017-09-02 | Disposition: A | Discharge status: Home / Self Care | Payer: Medicare Other | Source: Ambulatory Visit | Attending: Cardiovascular Disease | Admitting: Cardiovascular Disease

## 2017-09-02 ENCOUNTER — Encounter (HOSPITAL_COMMUNITY): Payer: Self-pay

## 2017-09-02 DIAGNOSIS — I214 Non-ST elevation (NSTEMI) myocardial infarction: Secondary | ICD-10-CM | POA: Diagnosis not present

## 2017-09-02 DIAGNOSIS — G4733 Obstructive sleep apnea (adult) (pediatric): Secondary | ICD-10-CM | POA: Diagnosis not present

## 2017-09-02 DIAGNOSIS — I10 Essential (primary) hypertension: Secondary | ICD-10-CM | POA: Diagnosis not present

## 2017-09-02 DIAGNOSIS — E119 Type 2 diabetes mellitus without complications: Secondary | ICD-10-CM | POA: Diagnosis not present

## 2017-09-02 DIAGNOSIS — Z955 Presence of coronary angioplasty implant and graft: Secondary | ICD-10-CM

## 2017-09-02 DIAGNOSIS — E785 Hyperlipidemia, unspecified: Secondary | ICD-10-CM | POA: Diagnosis not present

## 2017-09-03 ENCOUNTER — Other Ambulatory Visit: Payer: Self-pay | Admitting: Family Medicine

## 2017-09-03 DIAGNOSIS — N50819 Testicular pain, unspecified: Secondary | ICD-10-CM

## 2017-09-04 ENCOUNTER — Encounter (HOSPITAL_COMMUNITY): Payer: Medicare Other

## 2017-09-05 ENCOUNTER — Ambulatory Visit
Admission: RE | Admit: 2017-09-05 | Discharge: 2017-09-05 | Disposition: A | Discharge status: Home / Self Care | Payer: Medicare Other | Source: Ambulatory Visit | Attending: Family Medicine | Admitting: Family Medicine

## 2017-09-05 ENCOUNTER — Other Ambulatory Visit: Payer: Medicare Other

## 2017-09-05 DIAGNOSIS — N50811 Right testicular pain: Secondary | ICD-10-CM | POA: Diagnosis not present

## 2017-09-05 DIAGNOSIS — N50819 Testicular pain, unspecified: Secondary | ICD-10-CM

## 2017-09-06 ENCOUNTER — Encounter: Payer: Self-pay | Admitting: Cardiovascular Disease

## 2017-09-06 ENCOUNTER — Ambulatory Visit (INDEPENDENT_AMBULATORY_CARE_PROVIDER_SITE_OTHER): Payer: Medicare Other | Admitting: Cardiovascular Disease

## 2017-09-06 ENCOUNTER — Encounter (HOSPITAL_COMMUNITY): Payer: Medicare Other

## 2017-09-06 VITALS — BP 141/72 | HR 68 | Ht 70.5 in | Wt 209.0 lb

## 2017-09-06 DIAGNOSIS — I2583 Coronary atherosclerosis due to lipid rich plaque: Secondary | ICD-10-CM

## 2017-09-06 DIAGNOSIS — E7800 Pure hypercholesterolemia, unspecified: Secondary | ICD-10-CM

## 2017-09-06 DIAGNOSIS — I10 Essential (primary) hypertension: Secondary | ICD-10-CM | POA: Diagnosis not present

## 2017-09-06 DIAGNOSIS — I701 Atherosclerosis of renal artery: Secondary | ICD-10-CM | POA: Diagnosis not present

## 2017-09-06 DIAGNOSIS — I2510 Atherosclerotic heart disease of native coronary artery without angina pectoris: Secondary | ICD-10-CM | POA: Diagnosis not present

## 2017-09-06 LAB — BASIC METABOLIC PANEL
BUN/Creatinine Ratio: 18 (ref 10–24)
BUN: 14 mg/dL (ref 8–27)
CALCIUM: 9.7 mg/dL (ref 8.6–10.2)
CO2: 26 mmol/L (ref 20–29)
CREATININE: 0.8 mg/dL (ref 0.76–1.27)
Chloride: 101 mmol/L (ref 96–106)
GFR calc Af Amer: 104 mL/min/{1.73_m2} (ref >59)
GFR, EST NON AFRICAN AMERICAN: 90 mL/min/{1.73_m2} (ref >59)
Glucose: 131 mg/dL — ABNORMAL HIGH (ref 65–99)
Potassium: 4.9 mmol/L (ref 3.5–5.2)
Sodium: 140 mmol/L (ref 134–144)

## 2017-09-06 NOTE — Assessment & Plan Note (Signed)
History of CAD status post non-STEMI 07/02/2007.  He underwent PCI and stenting was proximal LAD with a Taxus Liberte drug-eluting stent which we have been following since that time.  He was asymptomatic until recently when he developed chest and arm pain.  A Myoview performed 04/04/2017 showed ischemia in the LAD territory.  I cath him 04/11/2017 revealing 99% in-stent restenosis and I restented him with a synergy drug-eluting stent.  He had no other significant CAD and his LV function was normal.  He was recently seen in the ER for right arm pain and had negative enzymes.  He was hypertensive as well as blood pressure medicines were adjusted.

## 2017-09-06 NOTE — Assessment & Plan Note (Signed)
History of essential hypertension with blood pressure measured at 141/72.  He is on ramipril which has been increased to 10 mg a day.  He checks his blood pressure at home and has participated in cardiac rehab.  For the most part his blood pressure has been under good control.  I am going to get a basic metabolic today panel today to check his renal function.

## 2017-09-06 NOTE — Progress Notes (Signed)
Discharge Progress Report  Patient Details  Name: Justin Carr MRN: [id number] Date of Birth: [birth year] Referring Provider:   Flowsheet Row CARDIAC REHAB PHASE II ORIENTATION from 05/28/2017 in [place]  Referring Provider  Quay Burow, MD.       Number of Visits: 36   Reason for Discharge:  Patient has met program and personal goals.  Smoking History:  Social History   Tobacco Use  Smoking Status Former Smoker  . Packs/day: 1.00  . Years: 30.00  . Pack years: 30.00  . Types: Cigarettes  . Last attempt to quit: 07/03/2006  . Years since quitting: 11.1  Smokeless Tobacco Never Used    Diagnosis:  Status post coronary artery stent placement 04/11/17 DES LAD  ADL UCSD:   Initial Exercise Prescription: Initial Exercise Prescription - 05/28/17 1100    Date of Initial Exercise RX and Referring Provider          Date  05/28/17    Referring Provider  Quay Burow, MD.        Treadmill          MPH  2.6    Grade  1    Minutes  10    METs  3.35        Bike          Level  1.2    Minutes  10    METs  3.32        NuStep          Level  3    SPM  85    Minutes  10    METs  3        Prescription Details          Frequency (times per week)  3    Duration  Progress to 30 minutes of continuous aerobic without signs/symptoms of physical distress        Intensity          THRR 40-80% of Max Heartrate  60-119    Ratings of Perceived Exertion  11-13    Perceived Dyspnea  0-4        Progression          Progression  Continue to progress workloads to maintain intensity without signs/symptoms of physical distress.        Resistance Training          Training Prescription  Yes    Weight  4lbs    Reps  10-15           Discharge Exercise Prescription (Final Exercise Prescription Changes): Exercise Prescription Changes - 09/02/17 1100    Response to Exercise          Blood Pressure (Admit)  122/62    Blood  Pressure (Exercise)  138/60    Blood Pressure (Exit)  108/70    Heart Rate (Admit)  71 bpm    Heart Rate (Exercise)  130 bpm    Heart Rate (Exit)  69 bpm    Rating of Perceived Exertion (Exercise)  11    Symptoms  none    Duration  Continue with 30 min of aerobic exercise without signs/symptoms of physical distress.    Intensity  THRR unchanged        Progression          Progression  Continue to progress workloads to maintain intensity without signs/symptoms of physical distress.    Average METs  5.8  Resistance Training          Training Prescription  Yes    Weight  5lbs    Reps  10-15    Time  10 Minutes        Treadmill          MPH  3.4    Grade  3    Minutes  10    METs  5.01        NuStep          Level  6    SPM  105    Minutes  10    METs  5.5        Rower          Level  4    Watts  81    Minutes  10    METs  7        Home Exercise Plan          Plans to continue exercise at  Home (comment) walking    Frequency  Add 2 additional days to program exercise sessions.    Initial Home Exercises Provided  06/17/17           Functional Capacity: 6 Minute Walk    6 Minute Walk    Row Name 05/28/17 0954 08/26/17 1212   Phase  Initial  Discharge   Distance  1643 feet  2137 feet   Distance % Change  no documentation  30.37 %   Distance Feet Change  no documentation  494 ft   Walk Time  6 minutes  6 minutes   # of Rest Breaks  0  0   MPH  3.11  11   METS  3.33  4.2   RPE  11  11   VO2 Peak  11.66  14.9   Symptoms  No  No   Resting HR  73 bpm  79 bpm   Resting BP  118/60  124/62   Resting Oxygen Saturation   98 %  no documentation   Exercise Oxygen Saturation  during 6 min walk  98 %  no documentation   Max Ex. HR  94 bpm  106 bpm   Max Ex. BP  142/64  124/70   2 Minute Post BP  128/64  no documentation          Psychological, QOL, Others - Outcomes: PHQ 2/9: Depression screen [facility] 2/9 09/02/2017 06/07/2017  Decreased Interest 0  0  Down, Depressed, Hopeless 0 0  PHQ - 2 Score 0 0    Quality of Life: Quality of Life - 08/28/17 1533    Quality of Life Scores          Health/Function Pre  27.9 %    Health/Function Post  27.9 %    Health/Function % Change  0 %    Socioeconomic Pre  27.43 %    Socioeconomic Post  26.64 %    Socioeconomic % Change   -2.88 %    Psych/Spiritual Pre  30 %    Psych/Spiritual Post  30 %    Psych/Spiritual % Change  0 %    Family Pre  27.6 %    Family Post  27.6 %    Family % Change  0 %    GLOBAL Pre  28.19 %    GLOBAL Post  28.03 %    GLOBAL % Change  -0.57 %  Personal Goals: Goals established at orientation with interventions provided to work toward goal. Personal Goals and Risk Factors at Admission - 05/28/17 1105    Core Components/Risk Factors/Patient Goals on Admission           Weight Management  Yes;Obesity    Intervention  Weight Management/Obesity: Establish reasonable short term and long term weight goals.;Obesity: Provide education and appropriate resources to help participant work on and attain dietary goals.    Admit Weight  215 lb 2.7 oz (97.6 kg)    Goal Weight: Short Term  209 lb 3.2 oz (94.9 kg)    Goal Weight: Long Term  195 lb 3.2 oz (88.5 kg)    Expected Outcomes  Short Term: Continue to assess and modify interventions until short term weight is achieved;Long Term: Adherence to nutrition and physical activity/exercise program aimed toward attainment of established weight goal;Weight Loss: Understanding of general recommendations for a balanced deficit meal plan, which promotes 1-2 lb weight loss per week and includes a negative energy balance of [phone] kcal/d;Understanding of distribution of calorie intake throughout the day with the consumption of 4-5 meals/snacks;Understanding recommendations for meals to include 15-35% energy as protein, 25-35% energy from fat, 35-60% energy from carbohydrates, less than 213m of dietary cholesterol, 20-35 gm of  total fiber daily    Hypertension  Yes    Intervention  Provide education on lifestyle modifcations including regular physical activity/exercise, weight management, moderate sodium restriction and increased consumption of fresh fruit, vegetables, and low fat dairy, alcohol moderation, and smoking cessation.;Monitor prescription use compliance.    Expected Outcomes  Short Term: Continued assessment and intervention until BP is < 140/980mHG in hypertensive participants. < 130/8078mG in hypertensive participants with diabetes, heart failure or chronic kidney disease.;Long Term: Maintenance of blood pressure at goal levels.    Lipids  Yes    Intervention  Provide education and support for participant on nutrition & aerobic/resistive exercise along with prescribed medications to achieve LDL <19m2mDL >40mg55m Expected Outcomes  Short Term: Participant states understanding of desired cholesterol values and is compliant with medications prescribed. Participant is following exercise prescription and nutrition guidelines.;Long Term: Cholesterol controlled with medications as prescribed, with individualized exercise RX and with personalized nutrition plan. Value goals: LDL < 19mg,16m > 40 mg.            Personal Goals Discharge: Goals and Risk Factor Review    Core Components/Risk Factors/Patient Goals Review    Row Name 06/07/17 1114 06/10/17 1529 07/10/17 1155 08/02/17 1202 09/02/17 1640   Personal Goals Review  Weight Management/Obesity;Hypertension;Lipids;Diabetes  Weight Management/Obesity;Hypertension;Lipids;Diabetes  Weight Management/Obesity;Hypertension;Lipids;Diabetes  Weight Management/Obesity;Hypertension;Lipids;Diabetes  Weight Management/Obesity;Hypertension;Lipids;Diabetes;Improve shortness of breath with ADL's   Review  pt with multiple CAD RF demonstrates eagerness to participate in CR program.  pt completed program 10 years ago and is interested in re-establishing home exercise program  to reduce disease progression.  pt with multiple CAD RF demonstrates eagerness to participate in CR program.  pt completed program 10 years ago and is interested in re-establishing home exercise program to reduce disease progression.  pt with multiple CAD RF demonstrates eagerness to participate in CR program.  pt completed program 10 years ago.  pt has not restarted HEP however remains "active".  pt does c/o occasional fatigue with golf.    pt with multiple CAD RF demonstrates eagerness to participate in CR program. pt recently started CBD oil without significant improvement in joint discomfort.   pt has not restarted  HEP however remains "active".  pt does c/o occasional fatigue with golf.  pt independant with his diabetes self care regimen.  pt aware of need to lose weight.     pt with multiple CAD RF demonstrates eagerness to participate in CR program.  pt independant with his diabetes self care regimen.  pt aware of need to lose weight.   pt plans to continue exercising on his own at local gym.    Expected Outcomes  pt will participate in CR exercise, nutrition and lifestyle modification opportunties to decrease overall RF.    pt will participate in CR exercise, nutrition and lifestyle modification opportunties to decrease overall RF.    pt will participate in CR exercise, nutrition and lifestyle modification opportunties to decrease overall RF.    pt will participate in CR exercise, nutrition and lifestyle modification opportunties to decrease overall RF.    pt will participate in exercise, nutrition and lifestyle modification opportunties to decrease overall RF.            Exercise Goals and Review: Exercise Goals    Exercise Goals    Row Name 05/28/17 0929   Increase Physical Activity  Yes   Intervention  Provide advice, education, support and counseling about physical activity/exercise needs.;Develop an individualized exercise prescription for aerobic and resistive training based on initial  evaluation findings, risk stratification, comorbidities and participant's personal goals.   Expected Outcomes  Short Term: Attend rehab on a regular basis to increase amount of physical activity.;Long Term: Exercising regularly at least 3-5 days a week.;Long Term: Add in home exercise to make exercise part of routine and to increase amount of physical activity.   Increase Strength and Stamina  Yes   Intervention  Provide advice, education, support and counseling about physical activity/exercise needs.;Develop an individualized exercise prescription for aerobic and resistive training based on initial evaluation findings, risk stratification, comorbidities and participant's personal goals.   Expected Outcomes  Short Term: Increase workloads from initial exercise prescription for resistance, speed, and METs.;Short Term: Perform resistance training exercises routinely during rehab and add in resistance training at home;Long Term: Improve cardiorespiratory fitness, muscular endurance and strength as measured by increased METs and functional capacity (6MWT)   Able to understand and use rate of perceived exertion (RPE) scale  Yes   Intervention  Provide education and explanation on how to use RPE scale   Expected Outcomes  Short Term: Able to use RPE daily in rehab to express subjective intensity level;Long Term:  Able to use RPE to guide intensity level when exercising independently   Knowledge and understanding of Target Heart Rate Range (THRR)  Yes   Intervention  Provide education and explanation of THRR including how the numbers were predicted and where they are located for reference   Expected Outcomes  Short Term: Able to state/look up THRR;Long Term: Able to use THRR to govern intensity when exercising independently;Short Term: Able to use daily as guideline for intensity in rehab   Able to check pulse independently  Yes   Intervention  Provide education and demonstration on how to check pulse in  carotid and radial arteries.;Review the importance of being able to check your own pulse for safety during independent exercise   Expected Outcomes  Short Term: Able to explain why pulse checking is important during independent exercise;Long Term: Able to check pulse independently and accurately   Understanding of Exercise Prescription  Yes   Intervention  Provide education, explanation, and written materials on patient's individual exercise  prescription   Expected Outcomes  Short Term: Able to explain program exercise prescription;Long Term: Able to explain home exercise prescription to exercise independently          Nutrition & Weight - Outcomes: Pre Biometrics - 05/28/17 1112    Pre Biometrics          Height  5' 10.5" (1.791 m)    Weight  215 lb 2.7 oz (97.6 kg)    Waist Circumference  42.25 inches    Hip Circumference  44 inches    Waist to Hip Ratio  0.96 %    BMI (Calculated)  30.43    Triceps Skinfold  20 mm    % Body Fat  30.6 %    Grip Strength  47 kg    Flexibility  9 in    Single Leg Stand  30 seconds          Post Biometrics - 08/26/17 1214     Post  Biometrics          Height  5' 10.5" (1.791 m)    Weight  207 lb 10.8 oz (94.2 kg)    Waist Circumference  41.5 inches    Hip Circumference  42.5 inches    Waist to Hip Ratio  0.98 %    BMI (Calculated)  29.37    Triceps Skinfold  20 mm    % Body Fat  29.9 %    Grip Strength  44.5 kg    Flexibility  11 in    Single Leg Stand  30 seconds           Nutrition: Nutrition Therapy & Goals - 05/28/17 0930    Nutrition Therapy          Diet  Carb Modified, Heart Healthy        Personal Nutrition Goals          Nutrition Goal  Pt to identify and limit food sources of saturated fat, trans fat, and sodium        Intervention Plan          Intervention  Prescribe, educate and counsel regarding individualized specific dietary modifications aiming towards targeted core components such as weight, hypertension,  lipid management, diabetes, heart failure and other comorbidities.    Expected Outcomes  Short Term Goal: Understand basic principles of dietary content, such as calories, fat, sodium, cholesterol and nutrients.;Long Term Goal: Adherence to prescribed nutrition plan.           Nutrition Discharge: Nutrition Assessments - 09/06/17 1211    MEDFICTS Scores          Pre Score  66    Post Score  46    Score Difference  -20           Education Questionnaire Score: Knowledge Questionnaire Score - 08/28/17 1533    Knowledge Questionnaire Score          Post Score  22/24           Goals reviewed with patient; copy given to patient.

## 2017-09-06 NOTE — Assessment & Plan Note (Signed)
History of hyperlipidemia on statin therapy followed by his PCP 

## 2017-09-06 NOTE — Assessment & Plan Note (Signed)
History of 60% right renal artery stenosis documented angiographically at the time of cath which we have been following by duplex ultrasound.  His last renal duplex performed 04/09/2016 showed no evidence of renal artery stenosis.

## 2017-09-06 NOTE — Patient Instructions (Signed)
Medication Instructions: Your physician recommends that you continue on your current medications as directed. Please refer to the Current Medication list given to you today.  Labwork: Your physician recommends that you return for lab work in: BMET today   Follow-Up: We request that you follow-up in: 6 weeks with an extender and in 6 months with Dr Andria Rhein will receive a reminder letter in the mail two months in advance. If you don't receive a letter, please call our office to schedule the follow-up appointment.  If you need a refill on your cardiac medications before your next appointment, please call your pharmacy.

## 2017-09-06 NOTE — Progress Notes (Signed)
09/06/2017 Justin Carr   [birth year]  [id number]  Primary Physician Shirline Frees, MD Primary Cardiologist: [name] MD FACP, [place], [place], [place]  HPI:  Justin Carr is a 72 y.o.   mildly overweight married Caucasian male, father of 2 children (1 living), who I 04/24/2017. 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 His problems include obstructive sleep apnea, on CPAP, hypertension, and hyperlipidemia. He denies chest pain or shortness of breath. He had a non-ST-segment-elevation myocardial infarction, July 02, 2007. He underwent PCI and stenting of his proximal LAD with a Taxus Liberte drug-eluting stent. He had also had a 60% right renal artery stenosis documented angiographically at that time, which we have been following by duplex ultrasound. Since I saw him back a year ago he denies chest pain or shortness of breathbut has noticed some left upper extremity discomfort with exertion similar to his pre-MI symptoms..Recent lipid profile performed by his PCP3/29/18 revealing a total cholesterol 113, LDL 48 and HDL of 35. he underwent Myoview stress testing 04/04/17 that showed ischemia in the LAD territory. I performed radial diagnostic cath on him 04/11/17 revealed a 99% "in-stent restenosis" within the proximal LAD stent. Remainder of his coronary anatomy was free of significant disease in his LV function was normal. I restarted him with a 3 mm x 20 mm long synergy drug-eluting stent postdilated to 3.23 mm resulting reduction a 9% stenosis to 0% residual. He has done well since.  Since I saw him in the office back in January he is done well until recently when he was seen in the emergency room with right arm pain.  He had an ischemic work-up which was negative.  His blood pressure was elevated and his medicines were adjusted.  He has participated in cardiac rehab since January and has  just completed this.  Otherwise, he denies chest pain or shortness of breath.    Current Meds  Medication Sig  . ALPHA-LIPOIC ACID PO Take 1 tablet by mouth daily.  Marland Kitchen aspirin 81 MG tablet Take 1 tablet (81 mg total) by mouth daily.  . clopidogrel (PLAVIX) 75 MG tablet Take 1 tablet (75 mg total) by mouth daily.  . Coenzyme Q10 (CO Q 10 PO) Take 1 capsule by mouth daily.   . Cyanocobalamin (B-12) 2500 MCG TABS Take 2,500 mcg by mouth daily.   Marland Kitchen gabapentin (NEURONTIN) 300 MG capsule Take 1 capsule (300 mg total) by mouth 3 (three) times daily.  . metFORMIN (GLUCOPHAGE) 1000 MG tablet Take 1,000 mg by mouth 2 (two) times daily with a meal.  . Multiple Vitamin (MULTIVITAMIN WITH MINERALS) TABS tablet Take 1 tablet by mouth daily.  . Multiple Vitamins-Minerals (PRESERVISION AREDS PO) Take 1 capsule by mouth daily.   . nitroGLYCERIN (NITROSTAT) 0.4 MG SL tablet Place 1 tablet (0.4 mg total) under the tongue every 5 (five) minutes as needed for chest pain.  . ramipril (ALTACE) 10 MG capsule Take 1 capsule (10 mg total) by mouth daily.  . ranitidine (ZANTAC) 150 MG tablet Take 1 tablet (150 mg total) by mouth 2 (two) times daily.  . sildenafil (REVATIO) 20 MG tablet Take 20-100 mg by mouth as needed (for erectile disfunction).   . sitaGLIPtin (JANUVIA) 50 MG tablet Take 50 mg by mouth daily.     Allergies  Allergen Reactions  . Peanuts [Peanut Oil] Anaphylaxis  . Lipitor [Atorvastatin] Other (See Comments)  Muscle cramps  . Rosuvastatin Other (See Comments)    Muscle cramps    Social History   Socioeconomic History  . Marital status: Married    Spouse name: Not on file  . Number of children: 2  . Years of education: College +  . Highest education level: Not on file  Occupational History  . Occupation: Retired  Scientific laboratory technician  . Financial resource strain: Not hard at all  . Food insecurity:    Worry: Never true    Inability: Never true  . Transportation needs:    Medical: No     Non-medical: No  Tobacco Use  . Smoking status: Former Smoker    Packs/day: 1.00    Years: 30.00    Pack years: 30.00    Types: Cigarettes    Last attempt to quit: 07/03/2006    Years since quitting: 11.1  . Smokeless tobacco: Never Used  Substance and Sexual Activity  . Alcohol use: Yes    Comment: 04/11/2017 "might have a beer q other week"  . Drug use: No  . Sexual activity: Not Currently  Lifestyle  . Physical activity:    Days per week: 3 days    Minutes per session: 30 min  . Stress: Not at all  Relationships  . Social connections:    Talks on phone: Not on file    Gets together: Not on file    Attends religious service: Not on file    Active member of club or organization: Not on file    Attends meetings of clubs or organizations: Not on file    Relationship status: Not on file  . Intimate partner violence:    Fear of current or ex partner: Not on file    Emotionally abused: Not on file    Physically abused: Not on file    Forced sexual activity: Not on file  Other Topics Concern  . Not on file  Social History Narrative   1 cup coffee and 2 sodas per day.   Lives at home with his wife.   Right-handed.     Review of Systems: General: negative for chills, fever, night sweats or weight changes.  Cardiovascular: negative for chest pain, dyspnea on exertion, edema, orthopnea, palpitations, paroxysmal nocturnal dyspnea or shortness of breath Dermatological: negative for rash Respiratory: negative for cough or wheezing Urologic: negative for hematuria Abdominal: negative for nausea, vomiting, diarrhea, bright red blood per rectum, melena, or hematemesis Neurologic: negative for visual changes, syncope, or dizziness All other systems reviewed and are otherwise negative except as noted above.    Blood pressure (!) 141/72, pulse 68, height 5' 10.5" (1.791 m), weight 209 lb (94.8 kg).  General appearance: alert and no distress Neck: no adenopathy, no carotid bruit, no  JVD, supple, symmetrical, trachea midline and thyroid not enlarged, symmetric, no tenderness/mass/nodules Lungs: clear to auscultation bilaterally Heart: regular rate and rhythm, S1, S2 normal, no murmur, click, rub or gallop Extremities: extremities normal, atraumatic, no cyanosis or edema Pulses: 2+ and symmetric Skin: Skin color, texture, turgor normal. No rashes or lesions Neurologic: Alert and oriented X 3, normal strength and tone. Normal symmetric reflexes. Normal coordination and gait  EKG not performed today  ASSESSMENT AND PLAN:   Coronary artery disease History of CAD status post non-STEMI 07/02/2007.  He underwent PCI and stenting was proximal LAD with a Taxus Liberte drug-eluting stent which we have been following since that time.  He was asymptomatic until recently when he developed chest  and arm pain.  A Myoview performed 04/04/2017 showed ischemia in the LAD territory.  I cath him 04/11/2017 revealing 99% in-stent restenosis and I restented him with a synergy drug-eluting stent.  He had no other significant CAD and his LV function was normal.  He was recently seen in the ER for right arm pain and had negative enzymes.  He was hypertensive as well as blood pressure medicines were adjusted.  Essential hypertension History of essential hypertension with blood pressure measured at 141/72.  He is on ramipril which has been increased to 10 mg a day.  He checks his blood pressure at home and has participated in cardiac rehab.  For the most part his blood pressure has been under good control.  I am going to get a basic metabolic today panel today to check his renal function.  Hyperlipidemia History of hyperlipidemia on statin therapy followed by his PCP  Renal artery stenosis History of 60% right renal artery stenosis documented angiographically at the time of cath which we have been following by duplex ultrasound.  His last renal duplex performed 04/09/2016 showed no evidence of renal  artery stenosis.      [name] MD FACP,FACC,FAHA, [facility] 09/06/2017 9:27 AM

## 2017-09-19 ENCOUNTER — Other Ambulatory Visit: Payer: Self-pay | Admitting: Family Medicine

## 2017-09-19 ENCOUNTER — Telehealth: Payer: Self-pay | Admitting: Cardiovascular Disease

## 2017-09-19 ENCOUNTER — Ambulatory Visit
Admission: RE | Admit: 2017-09-19 | Discharge: 2017-09-19 | Disposition: A | Discharge status: Home / Self Care | Payer: Medicare Other | Source: Ambulatory Visit | Attending: Family Medicine | Admitting: Family Medicine

## 2017-09-19 DIAGNOSIS — R918 Other nonspecific abnormal finding of lung field: Secondary | ICD-10-CM | POA: Diagnosis not present

## 2017-09-19 DIAGNOSIS — I10 Essential (primary) hypertension: Secondary | ICD-10-CM | POA: Diagnosis not present

## 2017-09-19 DIAGNOSIS — R9389 Abnormal findings on diagnostic imaging of other specified body structures: Secondary | ICD-10-CM

## 2017-09-19 DIAGNOSIS — N5082 Scrotal pain: Secondary | ICD-10-CM | POA: Diagnosis not present

## 2017-09-19 MED ORDER — RAMIPRIL 10 MG PO CAPS: 10.0000 mg | ORAL_CAPSULE | Freq: Every day | ORAL | 0 refills | Status: DC

## 2017-09-19 NOTE — Telephone Encounter (Signed)
New Message   Pt c/o medication issue:  1. Name of Medication: Rampiril  2. How are you currently taking this medication (dosage and times per day)? 10mg   3. Are you having a reaction (difficulty breathing--STAT)?   4. What is your medication issue? Patient states in the past he took rampiril 5mg . But when he went to the hospital they put him on 10. He wants to know if he is to stay on 10 or go back to the 5. But he will need a rx for either one. Please call.

## 2017-09-19 NOTE — Telephone Encounter (Signed)
RETURNED CALL TO PT, RX SENT TO PHARMACY AS REQUESTED

## 2017-09-24 ENCOUNTER — Ambulatory Visit: Payer: Medicare Other | Admitting: Cardiovascular Disease

## 2017-09-24 ENCOUNTER — Encounter (HOSPITAL_COMMUNITY): Payer: Self-pay

## 2017-09-24 NOTE — Progress Notes (Addendum)
Discharge Progress Report  Patient Details  Name: Justin Carr MRN: [id number] Date of Birth: Dec 11, 1945 Referring Provider:   Flowsheet Row CARDIAC REHAB PHASE II ORIENTATION from 05/28/2017 in [place]  Referring Provider  Quay Burow, MD.       Number of Visits: 36   Reason for Discharge:  Patient has met program and personal goals.  Smoking History:  Social History   Tobacco Use  Smoking Status Former Smoker  . Packs/day: 1.00  . Years: 30.00  . Pack years: 30.00  . Types: Cigarettes  . Last attempt to quit: 07/03/2006  . Years since quitting: 11.2  Smokeless Tobacco Never Used    Diagnosis:  Status post coronary artery stent placement 04/11/17 DES LAD  ADL UCSD:   Initial Exercise Prescription: Initial Exercise Prescription - 05/28/17 1100    Date of Initial Exercise RX and Referring Provider          Date  05/28/17    Referring Provider  Quay Burow, MD.        Treadmill          MPH  2.6    Grade  1    Minutes  10    METs  3.35        Bike          Level  1.2    Minutes  10    METs  3.32        NuStep          Level  3    SPM  85    Minutes  10    METs  3        Prescription Details          Frequency (times per week)  3    Duration  Progress to 30 minutes of continuous aerobic without signs/symptoms of physical distress        Intensity          THRR 40-80% of Max Heartrate  60-119    Ratings of Perceived Exertion  11-13    Perceived Dyspnea  0-4        Progression          Progression  Continue to progress workloads to maintain intensity without signs/symptoms of physical distress.        Resistance Training          Training Prescription  Yes    Weight  4lbs    Reps  10-15           Discharge Exercise Prescription (Final Exercise Prescription Changes): Exercise Prescription Changes - 09/02/17 1100    Response to Exercise          Blood Pressure (Admit)  122/62    Blood  Pressure (Exercise)  138/60    Blood Pressure (Exit)  108/70    Heart Rate (Admit)  71 bpm    Heart Rate (Exercise)  130 bpm    Heart Rate (Exit)  69 bpm    Rating of Perceived Exertion (Exercise)  11    Symptoms  none    Duration  Continue with 30 min of aerobic exercise without signs/symptoms of physical distress.    Intensity  THRR unchanged        Progression          Progression  Continue to progress workloads to maintain intensity without signs/symptoms of physical distress.    Average METs  5.8  Resistance Training          Training Prescription  Yes    Weight  5lbs    Reps  10-15    Time  10 Minutes        Treadmill          MPH  3.4    Grade  3    Minutes  10    METs  5.01        NuStep          Level  6    SPM  105    Minutes  10    METs  5.5        Rower          Level  4    Watts  81    Minutes  10    METs  7        Home Exercise Plan          Plans to continue exercise at  Home (comment) walking    Frequency  Add 2 additional days to program exercise sessions.    Initial Home Exercises Provided  06/17/17           Functional Capacity: 6 Minute Walk    6 Minute Walk    Row Name 05/28/17 0954 08/26/17 1212   Phase  Initial  Discharge   Distance  1643 feet  2137 feet   Distance % Change  no documentation  30.37 %   Distance Feet Change  no documentation  494 ft   Walk Time  6 minutes  6 minutes   # of Rest Breaks  0  0   MPH  3.11  11   METS  3.33  4.2   RPE  11  11   VO2 Peak  11.66  14.9   Symptoms  No  No   Resting HR  73 bpm  79 bpm   Resting BP  118/60  124/62   Resting Oxygen Saturation   98 %  no documentation   Exercise Oxygen Saturation  during 6 min walk  98 %  no documentation   Max Ex. HR  94 bpm  106 bpm   Max Ex. BP  142/64  124/70   2 Minute Post BP  128/64  no documentation          Psychological, QOL, Others - Outcomes: PHQ 2/9: Depression screen [facility] 2/9 09/02/2017 06/07/2017  Decreased Interest 0  0  Down, Depressed, Hopeless 0 0  PHQ - 2 Score 0 0    Quality of Life: Quality of Life - 08/28/17 1533    Quality of Life Scores          Health/Function Pre  27.9 %    Health/Function Post  27.9 %    Health/Function % Change  0 %    Socioeconomic Pre  27.43 %    Socioeconomic Post  26.64 %    Socioeconomic % Change   -2.88 %    Psych/Spiritual Pre  30 %    Psych/Spiritual Post  30 %    Psych/Spiritual % Change  0 %    Family Pre  27.6 %    Family Post  27.6 %    Family % Change  0 %    GLOBAL Pre  28.19 %    GLOBAL Post  28.03 %    GLOBAL % Change  -0.57 %  Personal Goals: Goals established at orientation with interventions provided to work toward goal. Personal Goals and Risk Factors at Admission - 05/28/17 1105    Core Components/Risk Factors/Patient Goals on Admission           Weight Management  Yes;Obesity    Intervention  Weight Management/Obesity: Establish reasonable short term and long term weight goals.;Obesity: Provide education and appropriate resources to help participant work on and attain dietary goals.    Admit Weight  215 lb 2.7 oz (97.6 kg)    Goal Weight: Short Term  209 lb 3.2 oz (94.9 kg)    Goal Weight: Long Term  195 lb 3.2 oz (88.5 kg)    Expected Outcomes  Short Term: Continue to assess and modify interventions until short term weight is achieved;Long Term: Adherence to nutrition and physical activity/exercise program aimed toward attainment of established weight goal;Weight Loss: Understanding of general recommendations for a balanced deficit meal plan, which promotes 1-2 lb weight loss per week and includes a negative energy balance of [phone] kcal/d;Understanding of distribution of calorie intake throughout the day with the consumption of 4-5 meals/snacks;Understanding recommendations for meals to include 15-35% energy as protein, 25-35% energy from fat, 35-60% energy from carbohydrates, less than 213m of dietary cholesterol, 20-35 gm of  total fiber daily    Hypertension  Yes    Intervention  Provide education on lifestyle modifcations including regular physical activity/exercise, weight management, moderate sodium restriction and increased consumption of fresh fruit, vegetables, and low fat dairy, alcohol moderation, and smoking cessation.;Monitor prescription use compliance.    Expected Outcomes  Short Term: Continued assessment and intervention until BP is < 140/980mHG in hypertensive participants. < 130/8078mG in hypertensive participants with diabetes, heart failure or chronic kidney disease.;Long Term: Maintenance of blood pressure at goal levels.    Lipids  Yes    Intervention  Provide education and support for participant on nutrition & aerobic/resistive exercise along with prescribed medications to achieve LDL <19m2mDL >40mg55m Expected Outcomes  Short Term: Participant states understanding of desired cholesterol values and is compliant with medications prescribed. Participant is following exercise prescription and nutrition guidelines.;Long Term: Cholesterol controlled with medications as prescribed, with individualized exercise RX and with personalized nutrition plan. Value goals: LDL < 19mg,16m > 40 mg.            Personal Goals Discharge: Goals and Risk Factor Review    Core Components/Risk Factors/Patient Goals Review    Row Name 06/07/17 1114 06/10/17 1529 07/10/17 1155 08/02/17 1202 09/02/17 1640   Personal Goals Review  Weight Management/Obesity;Hypertension;Lipids;Diabetes  Weight Management/Obesity;Hypertension;Lipids;Diabetes  Weight Management/Obesity;Hypertension;Lipids;Diabetes  Weight Management/Obesity;Hypertension;Lipids;Diabetes  Weight Management/Obesity;Hypertension;Lipids;Diabetes;Improve shortness of breath with ADL's   Review  pt with multiple CAD RF demonstrates eagerness to participate in CR program.  pt completed program 10 years ago and is interested in re-establishing home exercise program  to reduce disease progression.  pt with multiple CAD RF demonstrates eagerness to participate in CR program.  pt completed program 10 years ago and is interested in re-establishing home exercise program to reduce disease progression.  pt with multiple CAD RF demonstrates eagerness to participate in CR program.  pt completed program 10 years ago.  pt has not restarted HEP however remains "active".  pt does c/o occasional fatigue with golf.    pt with multiple CAD RF demonstrates eagerness to participate in CR program. pt recently started CBD oil without significant improvement in joint discomfort.   pt has not restarted  HEP however remains "active".  pt does c/o occasional fatigue with golf.  pt independant with his diabetes self care regimen.  pt aware of need to lose weight.     pt with multiple CAD RF demonstrates eagerness to participate in CR program.  pt independant with his diabetes self care regimen.  pt aware of need to lose weight.   pt plans to continue exercising on his own at local gym.    Expected Outcomes  pt will participate in CR exercise, nutrition and lifestyle modification opportunties to decrease overall RF.    pt will participate in CR exercise, nutrition and lifestyle modification opportunties to decrease overall RF.    pt will participate in CR exercise, nutrition and lifestyle modification opportunties to decrease overall RF.    pt will participate in CR exercise, nutrition and lifestyle modification opportunties to decrease overall RF.    pt will participate in exercise, nutrition and lifestyle modification opportunties to decrease overall RF.            Exercise Goals and Review: Exercise Goals    Exercise Goals    Row Name 05/28/17 0929   Increase Physical Activity  Yes   Intervention  Provide advice, education, support and counseling about physical activity/exercise needs.;Develop an individualized exercise prescription for aerobic and resistive training based on initial  evaluation findings, risk stratification, comorbidities and participant's personal goals.   Expected Outcomes  Short Term: Attend rehab on a regular basis to increase amount of physical activity.;Long Term: Exercising regularly at least 3-5 days a week.;Long Term: Add in home exercise to make exercise part of routine and to increase amount of physical activity.   Increase Strength and Stamina  Yes   Intervention  Provide advice, education, support and counseling about physical activity/exercise needs.;Develop an individualized exercise prescription for aerobic and resistive training based on initial evaluation findings, risk stratification, comorbidities and participant's personal goals.   Expected Outcomes  Short Term: Increase workloads from initial exercise prescription for resistance, speed, and METs.;Short Term: Perform resistance training exercises routinely during rehab and add in resistance training at home;Long Term: Improve cardiorespiratory fitness, muscular endurance and strength as measured by increased METs and functional capacity (6MWT)   Able to understand and use rate of perceived exertion (RPE) scale  Yes   Intervention  Provide education and explanation on how to use RPE scale   Expected Outcomes  Short Term: Able to use RPE daily in rehab to express subjective intensity level;Long Term:  Able to use RPE to guide intensity level when exercising independently   Knowledge and understanding of Target Heart Rate Range (THRR)  Yes   Intervention  Provide education and explanation of THRR including how the numbers were predicted and where they are located for reference   Expected Outcomes  Short Term: Able to state/look up THRR;Long Term: Able to use THRR to govern intensity when exercising independently;Short Term: Able to use daily as guideline for intensity in rehab   Able to check pulse independently  Yes   Intervention  Provide education and demonstration on how to check pulse in  carotid and radial arteries.;Review the importance of being able to check your own pulse for safety during independent exercise   Expected Outcomes  Short Term: Able to explain why pulse checking is important during independent exercise;Long Term: Able to check pulse independently and accurately   Understanding of Exercise Prescription  Yes   Intervention  Provide education, explanation, and written materials on patient's individual exercise  prescription   Expected Outcomes  Short Term: Able to explain program exercise prescription;Long Term: Able to explain home exercise prescription to exercise independently          Nutrition & Weight - Outcomes: Pre Biometrics - 05/28/17 1112    Pre Biometrics          Height  5' 10.5" (1.791 m)    Weight  215 lb 2.7 oz (97.6 kg)    Waist Circumference  42.25 inches    Hip Circumference  44 inches    Waist to Hip Ratio  0.96 %    BMI (Calculated)  30.43    Triceps Skinfold  20 mm    % Body Fat  30.6 %    Grip Strength  47 kg    Flexibility  9 in    Single Leg Stand  30 seconds          Post Biometrics - 08/26/17 1214     Post  Biometrics          Height  5' 10.5" (1.791 m)    Weight  207 lb 10.8 oz (94.2 kg)    Waist Circumference  41.5 inches    Hip Circumference  42.5 inches    Waist to Hip Ratio  0.98 %    BMI (Calculated)  29.37    Triceps Skinfold  20 mm    % Body Fat  29.9 %    Grip Strength  44.5 kg    Flexibility  11 in    Single Leg Stand  30 seconds           Nutrition: Nutrition Therapy & Goals - 05/28/17 0930    Nutrition Therapy          Diet  Carb Modified, Heart Healthy        Personal Nutrition Goals          Nutrition Goal  Pt to identify and limit food sources of saturated fat, trans fat, and sodium        Intervention Plan          Intervention  Prescribe, educate and counsel regarding individualized specific dietary modifications aiming towards targeted core components such as weight, hypertension,  lipid management, diabetes, heart failure and other comorbidities.    Expected Outcomes  Short Term Goal: Understand basic principles of dietary content, such as calories, fat, sodium, cholesterol and nutrients.;Long Term Goal: Adherence to prescribed nutrition plan.           Nutrition Discharge: Nutrition Assessments - 09/06/17 1211    MEDFICTS Scores          Pre Score  66    Post Score  46    Score Difference  -20           Education Questionnaire Score: Knowledge Questionnaire Score - 08/28/17 1533    Knowledge Questionnaire Score          Post Score  22/24           Goals reviewed with patient; copy given to patient.

## 2017-09-24 NOTE — Addendum Note (Signed)
Encounter addended by: Lowell Guitar, RN on: 09/24/2017 4:39 PM  Actions taken: Sign clinical note

## 2017-09-24 NOTE — Addendum Note (Signed)
Encounter addended by: Lowell Guitar, RN on: 09/24/2017 4:37 PM  Actions taken: Vitals modified, Sign clinical note

## 2017-10-04 DIAGNOSIS — H5310 Unspecified subjective visual disturbances: Secondary | ICD-10-CM | POA: Diagnosis not present

## 2017-10-04 DIAGNOSIS — H25012 Cortical age-related cataract, left eye: Secondary | ICD-10-CM | POA: Diagnosis not present

## 2017-10-04 DIAGNOSIS — H43813 Vitreous degeneration, bilateral: Secondary | ICD-10-CM | POA: Diagnosis not present

## 2017-10-14 DIAGNOSIS — N5201 Erectile dysfunction due to arterial insufficiency: Secondary | ICD-10-CM | POA: Diagnosis not present

## 2017-10-14 DIAGNOSIS — R102 Pelvic and perineal pain: Secondary | ICD-10-CM | POA: Diagnosis not present

## 2017-10-14 DIAGNOSIS — R350 Frequency of micturition: Secondary | ICD-10-CM | POA: Diagnosis not present

## 2017-10-14 DIAGNOSIS — N403 Nodular prostate with lower urinary tract symptoms: Secondary | ICD-10-CM | POA: Diagnosis not present

## 2017-10-17 DIAGNOSIS — M25551 Pain in right hip: Secondary | ICD-10-CM | POA: Insufficient documentation

## 2017-10-24 ENCOUNTER — Encounter: Payer: Self-pay | Admitting: Physician Assistant

## 2017-10-24 ENCOUNTER — Ambulatory Visit (INDEPENDENT_AMBULATORY_CARE_PROVIDER_SITE_OTHER): Payer: Medicare Other | Admitting: Physician Assistant

## 2017-10-24 VITALS — BP 126/66 | HR 58 | Ht 71.0 in | Wt 208.8 lb

## 2017-10-24 DIAGNOSIS — I701 Atherosclerosis of renal artery: Secondary | ICD-10-CM

## 2017-10-24 DIAGNOSIS — I10 Essential (primary) hypertension: Secondary | ICD-10-CM | POA: Diagnosis not present

## 2017-10-24 DIAGNOSIS — E785 Hyperlipidemia, unspecified: Secondary | ICD-10-CM

## 2017-10-24 DIAGNOSIS — I2510 Atherosclerotic heart disease of native coronary artery without angina pectoris: Secondary | ICD-10-CM

## 2017-10-24 MED ORDER — RAMIPRIL 10 MG PO CAPS
10.0000 mg | ORAL_CAPSULE | Freq: Every day | ORAL | 0 refills | Status: DC
Start: 2017-10-24 — End: 2018-02-05

## 2017-10-24 NOTE — Patient Instructions (Signed)
Medication Instructions:  Your physician recommends that you continue on your current medications as directed. Please refer to the Current Medication list given to you today.   Labwork: None ordered  Testing/Procedures: None ordered  Follow-Up: Your physician wants you to follow-up in: December with Dr. [name]   Any Other Special Instructions Will Be Listed Below (If Applicable).     If you need a refill on your cardiac medications before your next appointment, please call your pharmacy.

## 2017-10-24 NOTE — Progress Notes (Signed)
Cardiology Office Note   Date:  10/24/2017   ID:  Justin Carr, DOB October 15, 1945, MRN [id number]  PCP:  Shirline Frees, MD  Cardiologist: Dr. [name], 09/06/2017 [name], PA-C   No chief complaint on file.   History of Present Illness: Justin Carr is a 72 y.o. male with a history of NSTEMI 2009 s/p DES LAD, DES LAD 03/2017 for ISR, 60% R-RAS 2009, HTN, HLD, OSA on CPAP   Justin Carr presents for cardiology follow up.  He was playing golf 3-4 x week [facility] However, he developed an inguinal hernia and has not been exercising. Sees the surgeon next week.   When he was exercising, no CP or SOB w/ exertion.   His A1c is checked by Dr Chalmers Cater every year. Cholesterol by Dr Kenton Kingfisher.   No lower extremity edema, no orthopnea or PND.  No palpitations, no presyncope or syncope.   Past Medical History:  Diagnosis Date  . Coronary artery disease   . GERD (gastroesophageal reflux disease)   . Hyperlipidemia   . Hypertension   . NSTEMI (non-ST elevated myocardial infarction) ([place]) 06/2007   Justin Carr 07/27/2010  . OSA on CPAP   . Palpitations   . Pneumonia ~ 1971 X 1  . Type II diabetes mellitus ([place])     Past Surgical History:  Procedure Laterality Date  . CARDIOVASCULAR STRESS TEST  10/06/2007   No scintigraphic evidence of inducible myocardial ischemia. ECG positive for ischemia. Pharmacologically induced ST segment depression. Perfusion defect seen in inferior myocardial region consistent with diaphragmatic attenuation.  . CORONARY ANGIOPLASTY WITH STENT PLACEMENT  07/03/2007   LAD 90% ulcerated plaque proximal between 1st and 2nd branches stented with a 2.75x68mm Taxus Liberte stent deployed at 16atm. Postdilated at 16atm (3.19mm). resulting in reduction of 90% lesion to 0% residual with TIMI3 flow. Impingement on theostium of diag branch dilatedat 6atm resulting in reduction of 90% ostial D2 to <50% residual.  . CORONARY ANGIOPLASTY WITH STENT PLACEMENT   04/11/2017  . LEFT HEART CATH AND CORONARY ANGIOGRAPHY N/A 04/11/2017   Procedure: LEFT HEART CATH AND CORONARY ANGIOGRAPHY;  Surgeon: [name], MD;  Location: [facility];  Service: Cardiovascular;  Laterality: N/A;  . LIGAMENT REPAIR Right 03/2001   wrist  . RENAL ARTERIAL DOPPLER  09/23/1599   SMA and Cephalic artery >09% diameter reduction, Rt prox Renal artery 60-99% diameter reduction, Lft prox Renal artery 1-59% diameter reduction, kidneys are normal in size.  Marland Kitchen SHOULDER ARTHROSCOPY WITH SUBACROMIAL DECOMPRESSION Right 04/03/2013   Procedure: RIGHT SHOULDER ARTHROSCOPY WITH SUBACROMIAL DECOMPRESSION/DISTAL CLAVICLE RESECTION;  Surgeon: Marin Shutter, MD;  Location: [facility];  Service: Orthopedics;  Laterality: Right;  . TONSILLECTOMY AND ADENOIDECTOMY  ~ 1952  . TRANSTHORACIC ECHOCARDIOGRAM  10/06/2007   EF 63%, normal.    Current Outpatient Medications  Medication Sig Dispense Refill  . aspirin 81 MG tablet Take 1 tablet (81 mg total) by mouth daily. 90 tablet 3  . clopidogrel (PLAVIX) 75 MG tablet Take 1 tablet (75 mg total) by mouth daily. 90 tablet 3  . Coenzyme Q10 (CO Q 10 PO) Take 1 capsule by mouth daily.     Marland Kitchen gabapentin (NEURONTIN) 300 MG capsule Take 1 capsule (300 mg total) by mouth 3 (three) times daily. 270 capsule 3  . metFORMIN (GLUCOPHAGE) 1000 MG tablet Take 1,000 mg by mouth 2 (two) times daily with a meal.    . Multiple Vitamin (MULTIVITAMIN WITH  MINERALS) TABS tablet Take 1 tablet by mouth daily.    . nitroGLYCERIN (NITROSTAT) 0.4 MG SL tablet Place 1 tablet (0.4 mg total) under the tongue every 5 (five) minutes as needed for chest pain. 25 tablet 0  . ramipril (ALTACE) 10 MG capsule Take 1 capsule (10 mg total) by mouth daily. 30 capsule 0  . ranitidine (ZANTAC) 150 MG tablet Take 1 tablet (150 mg total) by mouth 2 (two) times daily. 180 tablet 3  . rosuvastatin (CRESTOR) 20 MG tablet Take 1 tablet (20 mg total) by mouth 3 (three) times a week. 36  tablet 3  . sildenafil (REVATIO) 20 MG tablet Take 20-100 mg by mouth as needed (for erectile disfunction).     . sitaGLIPtin (JANUVIA) 50 MG tablet Take 50 mg by mouth daily.    . ALPHA-LIPOIC ACID PO Take 1 tablet by mouth daily.    . Cyanocobalamin (B-12) 2500 MCG TABS Take 2,500 mcg by mouth daily.     . Multiple Vitamins-Minerals (PRESERVISION AREDS PO) Take 1 capsule by mouth daily.      No current facility-administered medications for this visit.     Allergies:   Peanuts [peanut oil]; Lipitor [atorvastatin]; and Rosuvastatin    Social History:  The patient  reports that he quit smoking about 11 years ago. His smoking use included cigarettes. He has a 30.00 pack-year smoking history. He has never used smokeless tobacco. He reports that he drinks alcohol. He reports that he does not use drugs.   Family History:  The patient's family history includes Arrhythmia in his father; Dementia in his father; Heart attack in his maternal grandfather; Heart disease in his mother; Parkinson's disease in his paternal grandfather.    ROS:  Please see the history of present illness. All other systems are reviewed and negative.    PHYSICAL EXAM: VS:  BP 126/66   Pulse (!) 58   Ht 5\' 11"  (1.803 m)   Wt 208 lb 12.8 oz (94.7 kg)   SpO2 96%   BMI 29.12 kg/m  , BMI Body mass index is 29.12 kg/m. GEN: Well nourished, well developed, male in no acute distress  HEENT: normal for age  Neck: no JVD, no carotid bruit, no masses Cardiac: RRR; no murmur, no rubs, or gallops Respiratory:  clear to auscultation bilaterally, normal work of breathing GI: soft, nontender, nondistended, + BS MS: no deformity or atrophy; no edema; distal pulses are 2+ in all 4 extremities   Skin: warm and dry, no rash Neuro:  Strength and sensation are intact Psych: euthymic mood, full affect   EKG:  EKG is not ordered today.   CARDIAC CATH: 04/11/2017  Prox LAD lesion is 99% stenosed.  A stent was successfully  placed.  Post intervention, there is a 0% residual stenosis.  The left ventricular systolic function is normal.  LV end diastolic pressure is normal.  The left ventricular ejection fraction is 55-65% by visual estimate.    Recent Labs: 04/05/2017: TSH 1.960 08/21/2017: Hemoglobin 14.7; Platelets 201 09/06/2017: BUN 14; Creatinine, Ser 0.80; Potassium 4.9; Sodium 140    Lipid Panel    Component Value Date/Time   CHOL 113 06/21/2016 0836   TRIG 151 (H) 06/21/2016 0836   HDL 35 (L) 06/21/2016 0836   CHOLHDL 3.2 06/21/2016 0836   VLDL 30 06/21/2016 0836   LDLCALC 48 06/21/2016 0836     Wt Readings from Last 3 Encounters:  10/24/17 208 lb 12.8 oz (94.7 kg)  09/06/17 209 lb (  94.8 kg)  08/26/17 207 lb 10.8 oz (94.2 kg)     Other studies Reviewed: Additional studies/ records that were reviewed today include: Office notes, hospital records and testing.  ASSESSMENT AND PLAN:  1.  CAD: He is on good therapy with aspirin, Plavix, statin.  No beta-blocker secondary to resting bradycardia. - He is at acceptable risk for hernia surgery, if it is needed, without further cardiac testing. - However, because he had a drug-eluting stent placed January 2019, it would be best to continue the aspirin and Plavix uninterrupted for 12 months.  I made the patient aware of this.  2.  Hyperlipidemia: His PCP did blood work October 2018.  At that time, his HDL was 38 and LDL was 57.  Triglycerides 174 and total cholesterol 131.  He is compliant with his statin.  Continue to follow through PCP.  3.  Hypertension: He is tolerating the higher dose of Altace well.  Dr. [name] checked his renal function and it was stable.  Continue the ramipril at this dose.   Current medicines are reviewed at length with the patient today.  The patient has concerns regarding medicines.  Concerns were addressed  The following changes have been made:  no change  Labs/ tests ordered today include:  No orders of the  defined types were placed in this encounter.    Disposition:   FU with Dr. [name]  Signed, [name], PA-C  10/24/2017 11:10 AM    [place] Phone: [phone]; Fax: [phone]  This note was written with the assistance of speech recognition software. Please excuse any transcriptional errors.

## 2017-11-01 DIAGNOSIS — R1031 Right lower quadrant pain: Secondary | ICD-10-CM | POA: Diagnosis not present

## 2017-11-05 ENCOUNTER — Other Ambulatory Visit: Payer: Self-pay | Admitting: Cardiovascular Disease

## 2017-11-05 NOTE — Telephone Encounter (Signed)
Rx request sent to pharmacy.  

## 2017-11-06 ENCOUNTER — Other Ambulatory Visit: Payer: Self-pay | Admitting: Surgery

## 2017-11-06 DIAGNOSIS — R1031 Right lower quadrant pain: Secondary | ICD-10-CM

## 2017-11-07 ENCOUNTER — Ambulatory Visit
Admission: RE | Admit: 2017-11-07 | Discharge: 2017-11-07 | Disposition: A | Discharge status: Home / Self Care | Payer: Medicare Other | Source: Ambulatory Visit | Attending: Surgery | Admitting: Surgery

## 2017-11-07 DIAGNOSIS — K4090 Unilateral inguinal hernia, without obstruction or gangrene, not specified as recurrent: Secondary | ICD-10-CM | POA: Diagnosis not present

## 2017-11-07 DIAGNOSIS — R1031 Right lower quadrant pain: Secondary | ICD-10-CM

## 2017-11-07 MED ORDER — IOPAMIDOL (ISOVUE-300) INJECTION 61%
100.0000 mL | Freq: Once | INTRAVENOUS | Status: AC | PRN
  Administered 2017-11-07: 100 mL via INTRAVENOUS

## 2017-11-11 ENCOUNTER — Ambulatory Visit: Payer: Self-pay | Admitting: Surgery

## 2017-11-11 ENCOUNTER — Telehealth: Payer: Self-pay | Admitting: *Deleted

## 2017-11-11 NOTE — Telephone Encounter (Signed)
PRIMARY DR [facility] Pre-operative Risk Assessment    Request for surgical clearance:  1. What type of surgery is being performed? RIGHT INGUINAL HERNIA REPAIR AND APPENDECTOMY  2. When is this surgery scheduled? YBA  3. What type of clearance is required (medical clearance vs. Pharmacy clearance to hold med vs. Both)? MEDICAL  4. Are there any medications that need to be held prior to surgery and how long? ASPIRIN 81 MG, PLAVIX 75 MG  5. Practice name and name of physician performing surgery? [facility]  ; DR TODD Harlow Asa  6. What is your office phone number[address] [place]   7.   What is your office fax number [place] [facility]  8.   Anesthesia type (None, local, MAC, general) ? GENERAL   Raiford Simmonds 11/11/2017, 5:10 PM  _________________________________________________________________   (provider comments below)

## 2017-11-12 NOTE — Telephone Encounter (Signed)
Routing to Dr. [name].   On DAPT due to DES LAD from January 2019.  Seen earlier this month by [name], PA for the clearance.   Dr. [name] - please advised about the aspirin and Plavix.  Please route your response back to CV DIV PREOP  Thanks  Burtis Junes, RN, [place] [address] [place], [place]  [address] [phone]

## 2017-11-13 NOTE — Progress Notes (Signed)
11/07/2017- noted in Epic- CT abd./pelvis w/contrast  08/21/2017- noted in Epic- EKG and CXR  04/11/2017- noted in Epic- Left heart cath and coronary angiography  04/04/2017- noted in Epic- Stress test

## 2017-11-13 NOTE — Patient Instructions (Addendum)
Blanton Kardell  11/13/2017   Your procedure is scheduled on: Friday 12/06/2017  Report to [facility] Main  Entrance              Report to admitting at  0730 AM    Call this number if you have problems the morning of surgery [phone]               Please bring CPAP mask and tubing with you to the hospital!   Remember: Do not eat food or drink liquids :After Midnight.    How to Manage Your Diabetes Before and After Surgery  Why is it important to control my blood sugar before and after surgery? . Improving blood sugar levels before and after surgery helps healing and can limit problems. . A way of improving blood sugar control is eating a healthy diet by: o  Eating less sugar and carbohydrates o  Increasing activity/exercise o  Talking with your doctor about reaching your blood sugar goals . High blood sugars (greater than 180 mg/dL) can raise your risk of infections and slow your recovery, so you will need to focus on controlling your diabetes during the weeks before surgery. . Make sure that the doctor who takes care of your diabetes knows about your planned surgery including the date and location.  How do I manage my blood sugar before surgery? . Check your blood sugar at least 4 times a day, starting 2 days before surgery, to make sure that the level is not too high or low. o Check your blood sugar the morning of your surgery when you wake up and every 2 hours until you get to the Short Stay unit. . If your blood sugar is less than 70 mg/dL, you will need to treat for low blood sugar: o Do not take insulin. o Treat a low blood sugar (less than 70 mg/dL) with  cup of clear juice (cranberry or apple), 4 glucose tablets, OR glucose gel. o Recheck blood sugar in 15 minutes after treatment (to make sure it is greater than 70 mg/dL). If your blood sugar is not greater than 70 mg/dL on recheck, call [phone] for further instructions. . Report your blood  sugar to the short stay nurse when you get to Short Stay.  . If you are admitted to the hospital after surgery: o Your blood sugar will be checked by the staff and you will probably be given insulin after surgery (instead of oral diabetes medicines) to make sure you have good blood sugar levels. o The goal for blood sugar control after surgery is 80-180 mg/dL.   WHAT DO I DO ABOUT MY DIABETES MEDICATION?       The day before surgery, Take Metformin (Glucophage ) as usual.       The day before surgery, Take Sitagliptin (Januvia) as usual.  . Do not take oral diabetes medicines (pills) the morning of surgery.    Take these medicines the morning of surgery with A SIP OF WATER:  Ranitidine (Zantac), Rosuvastatin (Crestor)   DO NOT TAKE ANY DIABETIC MEDICATIONS DAY OF YOUR SURGERY                               You may not have any metal on your body including hair pins and  piercings  Do not wear jewelry, make-up, lotions, powders or perfumes, deodorant                        Men may shave face and neck.   Do not bring valuables to the hospital. [place].  Contacts, dentures or bridgework may not be worn into surgery.  Leave suitcase in the car. After surgery it may be brought to your room.                Please read over the following fact sheets you were given: _____________________________________________________________________             [facility] - Preparing for Surgery Before surgery, you can play an important role.  Because skin is not sterile, your skin needs to be as free of germs as possible.  You can reduce the number of germs on your skin by washing with CHG (chlorahexidine gluconate) soap before surgery.  CHG is an antiseptic cleaner which kills germs and bonds with the skin to continue killing germs even after washing. Please DO NOT use if you have an allergy to CHG or antibacterial soaps.  If your skin  becomes reddened/irritated stop using the CHG and inform your nurse when you arrive at Short Stay. Do not shave (including legs and underarms) for at least 48 hours prior to the first CHG shower.  You may shave your face/neck. Please follow these instructions carefully:  1.  Shower with CHG Soap the night before surgery and the  morning of Surgery.  2.  If you choose to wash your hair, wash your hair first as usual with your  normal  shampoo.  3.  After you shampoo, rinse your hair and body thoroughly to remove the  shampoo.                           4.  Use CHG as you would any other liquid soap.  You can apply chg directly  to the skin and wash                       Gently with a scrungie or clean washcloth.  5.  Apply the CHG Soap to your body ONLY FROM THE NECK DOWN.   Do not use on face/ open                           Wound or open sores. Avoid contact with eyes, ears mouth and genitals (private parts).                       Wash face,  Genitals (private parts) with your normal soap.             6.  Wash thoroughly, paying special attention to the area where your surgery  will be performed.  7.  Thoroughly rinse your body with warm water from the neck down.  8.  DO NOT shower/wash with your normal soap after using and rinsing off  the CHG Soap.                9.  Pat yourself dry with a clean towel.            10.  Wear  clean pajamas.            11.  Place clean sheets on your bed the night of your first shower and do not  sleep with pets. Day of Surgery : Do not apply any lotions/deodorants the morning of surgery.  Please wear clean clothes to the hospital/surgery center.  FAILURE TO FOLLOW THESE INSTRUCTIONS MAY RESULT IN THE CANCELLATION OF YOUR SURGERY PATIENT SIGNATURE_________________________________  NURSE SIGNATURE__________________________________  ________________________________________________________________________

## 2017-11-14 ENCOUNTER — Encounter (HOSPITAL_COMMUNITY)
Admission: RE | Admit: 2017-11-14 | Discharge: 2017-11-14 | Disposition: A | Discharge status: Home / Self Care | Payer: Medicare Other | Source: Ambulatory Visit | Attending: Surgery | Admitting: Surgery

## 2017-11-14 ENCOUNTER — Encounter (HOSPITAL_COMMUNITY): Payer: Self-pay

## 2017-11-14 ENCOUNTER — Other Ambulatory Visit: Payer: Self-pay

## 2017-11-14 DIAGNOSIS — Z01812 Encounter for preprocedural laboratory examination: Secondary | ICD-10-CM | POA: Insufficient documentation

## 2017-11-14 DIAGNOSIS — D490 Neoplasm of unspecified behavior of digestive system: Secondary | ICD-10-CM | POA: Insufficient documentation

## 2017-11-14 LAB — CBC
HCT: 43.6 % (ref 39.0–52.0)
HEMOGLOBIN: 14.9 g/dL (ref 13.0–17.0)
MCH: 32.5 pg (ref 26.0–34.0)
MCHC: 34.2 g/dL (ref 30.0–36.0)
MCV: 95 fL (ref 78.0–100.0)
Platelets: 188 10*3/uL (ref 150–400)
RBC: 4.59 MIL/uL (ref 4.22–5.81)
RDW: 13 % (ref 11.5–15.5)
WBC: 7.2 10*3/uL (ref 4.0–10.5)

## 2017-11-14 LAB — BASIC METABOLIC PANEL
Anion gap: 7 (ref 5–15)
BUN: 14 mg/dL (ref 8–23)
CALCIUM: 9.7 mg/dL (ref 8.9–10.3)
CO2: 29 mmol/L (ref 22–32)
Chloride: 106 mmol/L (ref 98–111)
Creatinine, Ser: 1.05 mg/dL (ref 0.61–1.24)
Glucose, Bld: 142 mg/dL — ABNORMAL HIGH (ref 70–99)
Potassium: 5.1 mmol/L (ref 3.5–5.1)
SODIUM: 142 mmol/L (ref 135–145)

## 2017-11-14 LAB — HEMOGLOBIN A1C
Hgb A1c MFr Bld: 5.9 % — ABNORMAL HIGH (ref 4.8–5.6)
MEAN PLASMA GLUCOSE: 122.63 mg/dL

## 2017-11-14 LAB — GLUCOSE, CAPILLARY: Glucose-Capillary: 160 mg/dL — ABNORMAL HIGH (ref 70–99)

## 2017-11-17 NOTE — Telephone Encounter (Signed)
He just has=d a Prox LAD DES stent placed in 1/19. I'd prefer uninterrupted DES for 12 months unless there is urgency to perform the surgical ptocedure

## 2017-11-18 NOTE — Telephone Encounter (Signed)
   Primary Cardiologist: Quay Burow, MD  Chart reviewed as part of pre-operative protocol coverage. Patient was contacted 11/18/2017 in reference to pre-operative risk assessment for pending surgery as outlined below.  Justin Carr was last seen on 10/24/17 by  [name] PAC.    [name] recommended no further cardiac testing prior to surgery. Given his recent stent placement, Dr. [name] would prefer to continue ASA and plavix for 12 months without interruption, unless emergent surgery was needed.  Therefore, based on ACC/AHA guidelines, the patient would be at acceptable risk for the planned procedure without further cardiovascular testing.   I will route this recommendation to the requesting party via Epic fax function and remove from pre-op pool.  Please call with questions.  [place], PA 11/18/2017, 2:08 PM

## 2017-11-28 DIAGNOSIS — H2513 Age-related nuclear cataract, bilateral: Secondary | ICD-10-CM | POA: Diagnosis not present

## 2017-11-28 DIAGNOSIS — H353132 Nonexudative age-related macular degeneration, bilateral, intermediate dry stage: Secondary | ICD-10-CM | POA: Diagnosis not present

## 2017-11-28 DIAGNOSIS — H5203 Hypermetropia, bilateral: Secondary | ICD-10-CM | POA: Diagnosis not present

## 2017-12-06 ENCOUNTER — Ambulatory Visit (HOSPITAL_COMMUNITY): Admission: RE | Admit: 2017-12-06 | Payer: Medicare Other | Source: Ambulatory Visit | Admitting: Surgery

## 2017-12-06 ENCOUNTER — Encounter (HOSPITAL_COMMUNITY): Admission: RE | Payer: Self-pay | Source: Ambulatory Visit

## 2017-12-06 SURGERY — APPENDECTOMY, LAPAROSCOPIC
Anesthesia: General

## 2017-12-11 DIAGNOSIS — K4090 Unilateral inguinal hernia, without obstruction or gangrene, not specified as recurrent: Secondary | ICD-10-CM | POA: Diagnosis not present

## 2017-12-12 ENCOUNTER — Telehealth: Payer: Self-pay | Admitting: Cardiovascular Disease

## 2017-12-12 ENCOUNTER — Telehealth: Payer: Self-pay

## 2017-12-12 NOTE — Telephone Encounter (Signed)
Will notify preop clearance, and resubmitted request.

## 2017-12-12 NOTE — Telephone Encounter (Signed)
Called patient and notified we had sent to preop pool to be addressed

## 2017-12-12 NOTE — Telephone Encounter (Signed)
Patient called in regarding his upcoming surgeries. Clearance is in, but per Dr.Berry patient had a stent placed in 01/19, and did not want to interrupt his plavix or aspirin medication. Patient was okay with waiting his 12 months, but since last call he has been having severe pain where his hernia is located. He states he is in pain when sitting, and even when trying to tie his shoe. He would like to know if the risk of the surgery is severe. Please advise.  Thank you!

## 2017-12-12 NOTE — Telephone Encounter (Signed)
He can probably safely interrupt his antiplatelet therapy at this point.

## 2017-12-12 NOTE — Telephone Encounter (Signed)
     [facility] Pre-operative Risk Assessment    Request for surgical clearance:  1. What type of surgery is being performed? RIGHT INGUINAL HERNIA REPAIR AND APPENDECTOMY  2. When is this surgery scheduled? YBA  3. What type of clearance is required (medical clearance vs. Pharmacy clearance to hold med vs. Both)? MEDICAL  4. Are there any medications that need to be held prior to surgery and how long? ASPIRIN 81 MG, PLAVIX 75 MG  5. Practice name and name of physician performing surgery? [facility]  ; DR TODD Harlow Asa  6. What is your office phone number[address] [place]   7.   What is your office fax number [place]  8.   Anesthesia type (None, local, MAC, general) ? GENERAL   Please readdress preoperative clearance, see note from Dr.Berry on 12/12/17, regarding Plavix.

## 2017-12-12 NOTE — Telephone Encounter (Signed)
New Message:    Please call, he said it was a long detailed message,so it would probably be just easier to talk to the nurse.

## 2017-12-12 NOTE — Telephone Encounter (Signed)
   Primary Cardiologist: Quay Burow, MD  Chart reviewed as part of pre-operative protocol coverage. Given past medical history and time since last visit, based on ACC/AHA guidelines, Jerell Demery would be at acceptable risk for the planned procedure without further cardiovascular testing. I contacted Justin Carr by phone today and he denies any anginal symptoms.   Per Dr. [name] "He can probably safely interrupt his antiplatelet therapy at this point". See earlier encounter today.   Hold Plavix 5 days prior to procedure. If possible continue ASA, but can hold if absolutely necessary. Restart medications as soon as safe to do so from a surgical standpoint.   I will route this recommendation to the requesting party via Epic fax function and remove from pre-op pool.  Please call with questions.  Lyda Jester, PA-C 12/12/2017, 3:57 PM

## 2017-12-16 ENCOUNTER — Ambulatory Visit: Payer: Self-pay | Admitting: Surgery

## 2017-12-16 DIAGNOSIS — K4090 Unilateral inguinal hernia, without obstruction or gangrene, not specified as recurrent: Secondary | ICD-10-CM | POA: Diagnosis not present

## 2017-12-16 DIAGNOSIS — R1031 Right lower quadrant pain: Secondary | ICD-10-CM | POA: Diagnosis not present

## 2017-12-16 DIAGNOSIS — D373 Neoplasm of uncertain behavior of appendix: Secondary | ICD-10-CM | POA: Diagnosis not present

## 2017-12-30 NOTE — Pre-Procedure Instructions (Signed)
Justin Carr  12/30/2017      [facility] [place] [address] [place]) [place] [address] Phone: [phone] Fax: [phone]  [facility] [place] [place] [facility] [address] [facility] [address] [place] [address] Phone: [phone] Fax: [phone]  [id number] [place], [place] [address] Phone: [phone] Fax: [phone]    Your procedure is scheduled on Monday October 14.  Report to [facility] Admitting at 8:00 A.M.  Call this number if you have problems the morning of surgery:  [phone]   Remember:  Do not eat or drink after midnight.    Take these medicines the morning of surgery with A SIP OF WATER:   Gabapentin (neurontin) Ranitidine (Zantac)  DO NOT TAKE sitagliptin (Januvia) or Metformin (Glucophage) the day of surgery  Stop Clopidogrel (Plavix) 5 days prior to surgery  7 days prior to surgery STOP taking any Aleve, Naproxen, Ibuprofen, Motrin, Advil, Goody's, BC's, all herbal medications, fish oil, and all vitamins     How to Manage Your Diabetes Before and After Surgery  Why is it important to control my blood sugar before and after surgery? . Improving blood sugar levels before and after surgery helps healing and can limit problems. . A way of improving blood sugar control is eating a healthy diet by: o  Eating less sugar and carbohydrates o  Increasing activity/exercise o  Talking with your doctor about reaching your blood sugar goals . High blood sugars (greater than 180 mg/dL) can raise your risk of infections and slow your recovery, so you will need to focus on controlling your diabetes during the weeks before surgery. . Make sure that the doctor who takes care of your diabetes knows about your planned surgery including the date and location.  How do I  manage my blood sugar before surgery? . Check your blood sugar at least 4 times a day, starting 2 days before surgery, to make sure that the level is not too high or low. o Check your blood sugar the morning of your surgery when you wake up and every 2 hours until you get to the Short Stay unit. . If your blood sugar is less than 70 mg/dL, you will need to treat for low blood sugar: o Do not take insulin. o Treat a low blood sugar (less than 70 mg/dL) with  cup of clear juice (cranberry or apple), 4 glucose tablets, OR glucose gel. Recheck blood sugar in 15 minutes after treatment (to make sure it is greater than 70 mg/dL). If your blood sugar is not greater than 70 mg/dL on recheck, call [phone] o  for further instructions. . Report your blood sugar to the short stay nurse when you get to Short Stay.  . If you are admitted to the hospital after surgery: o Your blood sugar will be checked by the staff and you will probably be given insulin after surgery (instead of oral diabetes medicines) to make sure you have good blood sugar levels. o The goal for blood sugar control after surgery is 80-180 mg/dL.              Do not wear jewelry, make-up or nail polish.  Do not wear lotions, powders, or perfumes, or deodorant.  Do not shave 48 hours prior to surgery.  Men may shave face  and neck.  Do not bring valuables to the hospital.  [facility] is not responsible for any belongings or valuables.  Contacts, dentures or bridgework may not be worn into surgery.  Leave your suitcase in the car.  After surgery it may be brought to your room.  For patients admitted to the hospital, discharge time will be determined by your treatment team.  Patients discharged the day of surgery will not be allowed to drive home.   Special instructions:    [facility]- Preparing For Surgery  Before surgery, you can play an important role. Because skin is not sterile, your skin needs to be as free of  germs as possible. You can reduce the number of germs on your skin by washing with CHG (chlorahexidine gluconate) Soap before surgery.  CHG is an antiseptic cleaner which kills germs and bonds with the skin to continue killing germs even after washing.    Oral Hygiene is also important to reduce your risk of infection.  Remember - BRUSH YOUR TEETH THE MORNING OF SURGERY WITH YOUR REGULAR TOOTHPASTE  Please do not use if you have an allergy to CHG or antibacterial soaps. If your skin becomes reddened/irritated stop using the CHG.  Do not shave (including legs and underarms) for at least 48 hours prior to first CHG shower. It is OK to shave your face.  Please follow these instructions carefully.   1. Shower the NIGHT BEFORE SURGERY and the MORNING OF SURGERY with CHG.   2. If you chose to wash your hair, wash your hair first as usual with your normal shampoo.  3. After you shampoo, rinse your hair and body thoroughly to remove the shampoo.  4. Use CHG as you would any other liquid soap. You can apply CHG directly to the skin and wash gently with a scrungie or a clean washcloth.   5. Apply the CHG Soap to your body ONLY FROM THE NECK DOWN.  Do not use on open wounds or open sores. Avoid contact with your eyes, ears, mouth and genitals (private parts). Wash Face and genitals (private parts)  with your normal soap.  6. Wash thoroughly, paying special attention to the area where your surgery will be performed.  7. Thoroughly rinse your body with warm water from the neck down.  8. DO NOT shower/wash with your normal soap after using and rinsing off the CHG Soap.  9. Pat yourself dry with a CLEAN TOWEL.  10. Wear CLEAN PAJAMAS to bed the night before surgery, wear comfortable clothes the morning of surgery  11. Place CLEAN SHEETS on your bed the night of your first shower and DO NOT SLEEP WITH PETS.    Day of Surgery:  Do not apply any deodorants/lotions.  Please wear clean clothes to the  hospital/surgery center.   Remember to brush your teeth WITH YOUR REGULAR TOOTHPASTE.    Please read over the following fact sheets that you were given. Coughing and Deep Breathing and Surgical Site Infection Prevention

## 2017-12-31 ENCOUNTER — Other Ambulatory Visit: Payer: Self-pay

## 2017-12-31 ENCOUNTER — Encounter (HOSPITAL_COMMUNITY): Payer: Self-pay

## 2017-12-31 ENCOUNTER — Encounter (HOSPITAL_COMMUNITY)
Admission: RE | Admit: 2017-12-31 | Discharge: 2017-12-31 | Disposition: A | Discharge status: Home / Self Care | Payer: Medicare Other | Source: Ambulatory Visit | Attending: Surgery | Admitting: Surgery

## 2017-12-31 DIAGNOSIS — K4090 Unilateral inguinal hernia, without obstruction or gangrene, not specified as recurrent: Secondary | ICD-10-CM | POA: Insufficient documentation

## 2017-12-31 DIAGNOSIS — Z7902 Long term (current) use of antithrombotics/antiplatelets: Secondary | ICD-10-CM | POA: Insufficient documentation

## 2017-12-31 DIAGNOSIS — Z01818 Encounter for other preprocedural examination: Secondary | ICD-10-CM | POA: Insufficient documentation

## 2017-12-31 DIAGNOSIS — Z7984 Long term (current) use of oral hypoglycemic drugs: Secondary | ICD-10-CM | POA: Diagnosis not present

## 2017-12-31 DIAGNOSIS — Z7982 Long term (current) use of aspirin: Secondary | ICD-10-CM | POA: Diagnosis not present

## 2017-12-31 DIAGNOSIS — Z79899 Other long term (current) drug therapy: Secondary | ICD-10-CM | POA: Diagnosis not present

## 2017-12-31 LAB — CBC
HCT: 43.5 % (ref 39.0–52.0)
HEMOGLOBIN: 14.8 g/dL (ref 13.0–17.0)
MCH: 32.9 pg (ref 26.0–34.0)
MCHC: 34 g/dL (ref 30.0–36.0)
MCV: 96.7 fL (ref 80.0–100.0)
NRBC: 0 % (ref 0.0–0.2)
PLATELETS: 172 10*3/uL (ref 150–400)
RBC: 4.5 MIL/uL (ref 4.22–5.81)
RDW: 12.6 % (ref 11.5–15.5)
WBC: 6.2 10*3/uL (ref 4.0–10.5)

## 2017-12-31 LAB — BASIC METABOLIC PANEL
ANION GAP: 8 (ref 5–15)
BUN: 12 mg/dL (ref 8–23)
CALCIUM: 8.9 mg/dL (ref 8.9–10.3)
CO2: 25 mmol/L (ref 22–32)
Chloride: 105 mmol/L (ref 98–111)
Creatinine, Ser: 0.9 mg/dL (ref 0.61–1.24)
Glucose, Bld: 118 mg/dL — ABNORMAL HIGH (ref 70–99)
POTASSIUM: 4.3 mmol/L (ref 3.5–5.1)
Sodium: 138 mmol/L (ref 135–145)

## 2017-12-31 LAB — GLUCOSE, CAPILLARY: GLUCOSE-CAPILLARY: 121 mg/dL — AB (ref 70–99)

## 2017-12-31 NOTE — Progress Notes (Signed)
PCP - Dr. Kenton Kingfisher With [facility] Cardiologist - Dr. [name] Endocrinologist- Dr. Chalmers Cater  Chest x-ray - 09/20/17 EKG - 08/21/17 Stress Test - 04/04/17 ECHO - 10/05/17 Cardiac Cath - 04/11/17  Sleep Study - 08/30/11, Dr. Maxwell Caul CPAP - does not know pressure settings, will bring tube and mask DOS  Fasting Blood Sugar - 120-130s, pt checks occasionally at home. A1c 5.9 11/14/17 Checks Blood Sugar _____ times a day  Blood Thinner Instructions:Stop Plavix 5 days prior to surgery, keep taking ASA   Anesthesia review: hx CAD, stent in January 2019. No problems since per patient.   Patient denies shortness of breath, fever, cough and chest pain at PAT appointment   Patient verbalized understanding of instructions that were given to them at the PAT appointment. Patient was also instructed that they will need to review over the PAT instructions again at home before surgery.

## 2018-01-01 NOTE — Progress Notes (Signed)
Anesthesia Chart Review:  Case:  [id number] Date/Time:  01/06/18 0945   Procedures:      OPEN RIGHT INGUINAL HERNIA REPAIR WITH MESH (Right )     INSERTION OF MESH (Right )   Anesthesia type:  General   Pre-op diagnosis:  RIGHT INGUINAL HERNIA   Location:  [facility] OR ROOM 02 / [place] OR   Surgeon:  Armandina Gemma, MD      DISCUSSION: 72 yo male former smoker. Pertinent hx includes GERD, HTN, OSA on CPAP, DMII, CAD (NSTEMI 2009 s/p DES LAD, DES LAD 03/2017 for ISR, 60% R-RAS 2009).  Cardiac clearance by Lyda Jester, PA-C 12/12/2017 states: "Chart reviewed as part of pre-operative protocol coverage. Given past medical history and time since last visit, based on ACC/AHA guidelines, Zakai Gonyea would be at acceptable risk for the planned procedure without further cardiovascular testing. I contacted pt by phone today and he denies any anginal symptoms.   Per Dr. [name] "He can probably safely interrupt his antiplatelet therapy at this point". See earlier encounter today.   Hold Plavix 5 days prior to procedure. If possible continue ASA, but can hold if absolutely necessary. Restart medications as soon as safe to do so from a surgical standpoint."  Anticipate he can proceed as planned barring acute status change.  VS: BP (!) 131/57   Pulse (!) 56   Temp 36.7 C (Oral)   Resp 18   Ht 5\' 11"  (1.803 m)   Wt 95.5 kg   SpO2 97%   BMI 29.37 kg/m   PROVIDERS: Shirline Frees, MD is PCP  Quay Burow, MD is Cardiologist  Jacelyn Pi, MD is Endocrinologist  LABS: Labs reviewed: Acceptable for surgery. (all labs ordered are listed, but only abnormal results are displayed)  Labs Reviewed  GLUCOSE, CAPILLARY - Abnormal; Notable for the following components:      Result Value   Glucose-Capillary 121 (*)    All other components within normal limits  BASIC METABOLIC PANEL - Abnormal; Notable for the following components:   Glucose, Bld 118 (*)    All other components within normal limits   CBC     IMAGES: CHEST - 2 VIEW 09/19/2017:  COMPARISON:  08/21/2017.  FINDINGS: Mediastinum and hilar structures normal. Lungs are clear. No pleural effusion or pneumothorax. Heart size normal. Degenerative change thoracic spine.  IMPRESSION: No acute cardiopulmonary disease.  EKG: 08/21/2017: NSR  CV: Cath and PCI 04/11/2017:  Prox LAD lesion is 99% stenosed.  A stent was successfully placed.  Post intervention, there is a 0% residual stenosis.  The left ventricular systolic function is normal.  LV end diastolic pressure is normal.  The left ventricular ejection fraction is 55-65% by visual estimate.  Past Medical History:  Diagnosis Date  . Coronary artery disease   . GERD (gastroesophageal reflux disease)   . Hyperlipidemia   . Hypertension   . NSTEMI (non-ST elevated myocardial infarction) ([place]) 06/2007   [name] 07/27/2010  . OSA on CPAP   . Palpitations   . Pneumonia ~ 1971 X 1  . Type II diabetes mellitus ([place])     Past Surgical History:  Procedure Laterality Date  . CARDIOVASCULAR STRESS TEST  10/06/2007   No scintigraphic evidence of inducible myocardial ischemia. ECG positive for ischemia. Pharmacologically induced ST segment depression. Perfusion defect seen in inferior myocardial region consistent with diaphragmatic attenuation.  . CORONARY ANGIOPLASTY WITH STENT PLACEMENT  07/03/2007   LAD 90% ulcerated plaque proximal between 1st and 2nd branches stented with  a 2.75x63mm Taxus Liberte stent deployed at 16atm. Postdilated at 16atm (3.19mm). resulting in reduction of 90% lesion to 0% residual with TIMI3 flow. Impingement on theostium of diag branch dilatedat 6atm resulting in reduction of 90% ostial D2 to <50% residual.  . CORONARY ANGIOPLASTY WITH STENT PLACEMENT  04/11/2017  . LEFT HEART CATH AND CORONARY ANGIOGRAPHY N/A 04/11/2017   Procedure: LEFT HEART CATH AND CORONARY ANGIOGRAPHY;  Surgeon: [name], MD;  Location: [place] CV LAB;   Service: Cardiovascular;  Laterality: N/A;  . LIGAMENT REPAIR Right 03/2001   wrist  . RENAL ARTERIAL DOPPLER  1/85/6314   SMA and Cephalic artery >97% diameter reduction, Rt prox Renal artery 60-99% diameter reduction, Lft prox Renal artery 1-59% diameter reduction, kidneys are normal in size.  Marland Kitchen SHOULDER ARTHROSCOPY WITH SUBACROMIAL DECOMPRESSION Right 04/03/2013   Procedure: RIGHT SHOULDER ARTHROSCOPY WITH SUBACROMIAL DECOMPRESSION/DISTAL CLAVICLE RESECTION;  Surgeon: Marin Shutter, MD;  Location: [facility];  Service: Orthopedics;  Laterality: Right;  . TONSILLECTOMY AND ADENOIDECTOMY  ~ 1952  . TRANSTHORACIC ECHOCARDIOGRAM  10/06/2007   EF 63%, normal.  . WISDOM TOOTH EXTRACTION     age 60    MEDICATIONS: . aspirin 81 MG tablet  . BENFOTIAMINE PO  . clopidogrel (PLAVIX) 75 MG tablet  . Coenzyme Q10 (COQ-10 PO)  . gabapentin (NEURONTIN) 300 MG capsule  . metFORMIN (GLUCOPHAGE-XR) 500 MG 24 hr tablet  . Multiple Vitamin (MULTIVITAMIN WITH MINERALS) TABS tablet  . Multiple Vitamins-Minerals (PRESERVISION AREDS PO)  . nitroGLYCERIN (NITROSTAT) 0.4 MG SL tablet  . ramipril (ALTACE) 10 MG capsule  . ranitidine (ZANTAC) 150 MG tablet  . rosuvastatin (CRESTOR) 20 MG tablet  . sildenafil (REVATIO) 20 MG tablet  . sitaGLIPtin (JANUVIA) 50 MG tablet   No current facility-administered medications for this encounter.      Wynonia Musty [facility] Short Stay Center/Anesthesiology Phone [phone] 01/01/2018 10:19 AM

## 2018-01-03 MED ORDER — SODIUM CHLORIDE 0.9 % IV SOLN
1.0000 g | INTRAVENOUS | Status: AC
  Administered 2018-01-06: 1 g via INTRAVENOUS
  Filled 2018-01-03: qty 1

## 2018-01-05 ENCOUNTER — Encounter (HOSPITAL_COMMUNITY): Payer: Self-pay | Admitting: Surgery

## 2018-01-05 DIAGNOSIS — K4090 Unilateral inguinal hernia, without obstruction or gangrene, not specified as recurrent: Secondary | ICD-10-CM

## 2018-01-05 NOTE — H&P (Signed)
General Surgery [facility]  [name] Documented: 12/16/2017 8:56 AM Location: [facility] Patient #: [id number] DOB: 1945/11/11 Married / Language: Cleophus Molt / Race: White Male   History of Present Illness Earnstine Regal MD; 12/16/2017 9:15 AM) The patient is a 72 year old male who presents with an inguinal hernia.  CHIEF COMPLAINT: right inguinal hernia  Patient returns to be evaluated for right inguinal hernia repair. Over the past few weeks this has become progressively more symptomatic. Patient notes pain relief when lying supine or prone. However, upon standing, sitting, or engaging in physical activity, he is having more pain. Patient was evaluated by his primary care provider. They felt that the pain was due to the inguinal hernia. Patient contacted his cardiologist and we have received a clearance from Dr. Quay Burow to hold the patient's Plavix for 5 days and continue his baby aspirin so that we may proceed with hernia repair in the near future. Patient does have a cystic mass on the appendix which will require laparoscopic appendectomy as a separate surgical procedure.   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 multiple Colon Polyp Removal - Colonoscopy  Oral Surgery  Shoulder Surgery  Right. Tonsillectomy   Diagnostic Studies History Earnstine Regal, MD; 12/16/2017 9:16 AM) Colonoscopy  1-5 years ago  Allergies Sabino Gasser; 12/16/2017 8:56 AM) No Known Drug Allergies [11/01/2017]: Allergies Reconciled   Medication History Sabino Gasser; 12/16/2017 8:57 AM) Medications Reconciled Ramipril (10MG  Capsule, Oral) Active. Sildenafil Citrate (20MG  Tablet, Oral) Active. Rosuvastatin Calcium (20MG  Tablet, Oral)  Active. Januvia (50MG  Tablet, Oral) Active. MetFORMIN HCl ER (500MG  Tablet ER 24HR, Oral) Active. Gabapentin (300MG  Capsule, Oral) Active. Clopidogrel Bisulfate (75MG  Tablet, Oral) Active.  Social History Earnstine Regal, MD; 12/16/2017 9:16 AM) Alcohol use  Occasional alcohol use. Caffeine use  Coffee. No drug use  Tobacco use  Former smoker.  Family History Earnstine Regal, MD; 12/16/2017 9:16 AM) Colon Cancer  Mother. Heart Disease  Father. Hypertension  Father. Thyroid problems  Mother.  Other Problems Earnstine Regal, MD; 12/16/2017 9:16 AM) Chest pain  Diabetes Mellitus  High blood pressure  Hypercholesterolemia  Myocardial infarction  Sleep Apnea   Vitals Sabino Gasser; 12/16/2017 8:57 AM) 12/16/2017 8:56 AM Weight: 212.13 lb Height: 71in Body Surface Area: 2.16 m Body Mass Index: 29.59 kg/m  Temp.: 98.80F(Oral)  Pulse: 66 (Regular)  BP: 130/82 (Sitting, Left Arm, Standard)       Physical Exam Earnstine Regal MD; 12/16/2017 9:16 AM) The physical exam findings are as follows: Note:See vital signs recorded above  GENERAL APPEARANCE Development: normal Nutritional status: normal Gross deformities: none  SKIN Rash, lesions, ulcers: none Induration, erythema: none Nodules: none palpable  EYES Conjunctiva and lids: normal Pupils: equal and reactive Iris: normal bilaterally  EARS, NOSE, MOUTH, THROAT External ears: no lesion or deformity External nose: no lesion or deformity Hearing: grossly normal Lips: no lesion or deformity Dentition: normal for age Oral mucosa: moist  NECK Symmetric: yes Trachea: midline Thyroid: no palpable nodules in the thyroid bed  CHEST Respiratory effort: normal Retraction or accessory muscle use: no Breath sounds: normal bilaterally Rales, rhonchi, wheeze: none  CARDIOVASCULAR Auscultation: regular rhythm, normal rate Murmurs: none Pulses: carotid and radial pulse 2+ palpable Lower  extremity edema: none Lower  extremity varicosities: none  ABDOMEN Distension: none Masses: none palpable Tenderness: none Hepatosplenomegaly: not present Hernia: Obvious bulge right groin, augments with Valsalva and cough, not easily reducible, mild discomfort to palpation  GENITOURINARY Penis: no lesions Scrotum: no masses  MUSCULOSKELETAL Station and gait: normal Digits and nails: no clubbing or cyanosis Muscle strength: grossly normal all extremities Range of motion: grossly normal all extremities Deformity: none  LYMPHATIC Cervical: none palpable Supraclavicular: none palpable  PSYCHIATRIC Oriented to person, place, and time: yes Mood and affect: normal for situation Judgment and insight: appropriate for situation    Assessment & Plan Earnstine Regal MD; 12/16/2017 9:18 AM) RIGHT INGUINAL HERNIA (K40.90) Current Plans Pt Education - Pamphlet Given - Hernia Surgery: discussed with patient and provided information. Patient returns today to discuss surgical repair of right inguinal hernia. This is becoming progressively more symptomatic. Patient has obtained cardiac clearance from his cardiologist. We will plan to hold his Plavix for 5 days to continue his baby aspirin through the surgery.  Patient I again discussed his hernia repair. We discussed restrictions on his activities after the procedure. I provided him with written literature on hernia surgery to review at home.  We will plan to proceed with appendectomy at a separate time later this fall.  The risks and benefits of the procedure have been discussed at length with the patient. The patient understands the proposed procedure, potential alternative treatments, and the course of recovery to be expected. All of the patient's questions have been answered at this time. The patient wishes to proceed with surgery.   Armandina Gemma, [facility] Office: [phone]

## 2018-01-06 ENCOUNTER — Ambulatory Visit (HOSPITAL_COMMUNITY)
Admission: RE | Admit: 2018-01-06 | Discharge: 2018-01-06 | Disposition: A | Discharge status: Home / Self Care | Payer: Medicare Other | Source: Ambulatory Visit | Attending: Surgery | Admitting: Surgery

## 2018-01-06 ENCOUNTER — Encounter (HOSPITAL_COMMUNITY): Payer: Self-pay | Admitting: *Deleted

## 2018-01-06 ENCOUNTER — Ambulatory Visit (HOSPITAL_COMMUNITY): Payer: Medicare Other | Admitting: Anesthesiology

## 2018-01-06 ENCOUNTER — Encounter (HOSPITAL_COMMUNITY)
Admission: RE | Disposition: A | Discharge status: Home / Self Care | Payer: Self-pay | Source: Ambulatory Visit | Attending: Surgery

## 2018-01-06 ENCOUNTER — Ambulatory Visit (HOSPITAL_COMMUNITY): Payer: Medicare Other | Admitting: Physician Assistant

## 2018-01-06 DIAGNOSIS — Z91010 Allergy to peanuts: Secondary | ICD-10-CM | POA: Diagnosis not present

## 2018-01-06 DIAGNOSIS — Z7902 Long term (current) use of antithrombotics/antiplatelets: Secondary | ICD-10-CM | POA: Insufficient documentation

## 2018-01-06 DIAGNOSIS — Z9989 Dependence on other enabling machines and devices: Secondary | ICD-10-CM | POA: Insufficient documentation

## 2018-01-06 DIAGNOSIS — K219 Gastro-esophageal reflux disease without esophagitis: Secondary | ICD-10-CM | POA: Insufficient documentation

## 2018-01-06 DIAGNOSIS — Z888 Allergy status to other drugs, medicaments and biological substances status: Secondary | ICD-10-CM | POA: Diagnosis not present

## 2018-01-06 DIAGNOSIS — Z8249 Family history of ischemic heart disease and other diseases of the circulatory system: Secondary | ICD-10-CM | POA: Insufficient documentation

## 2018-01-06 DIAGNOSIS — Z7982 Long term (current) use of aspirin: Secondary | ICD-10-CM | POA: Diagnosis not present

## 2018-01-06 DIAGNOSIS — I252 Old myocardial infarction: Secondary | ICD-10-CM | POA: Diagnosis not present

## 2018-01-06 DIAGNOSIS — E7800 Pure hypercholesterolemia, unspecified: Secondary | ICD-10-CM | POA: Insufficient documentation

## 2018-01-06 DIAGNOSIS — Z87891 Personal history of nicotine dependence: Secondary | ICD-10-CM | POA: Insufficient documentation

## 2018-01-06 DIAGNOSIS — K4090 Unilateral inguinal hernia, without obstruction or gangrene, not specified as recurrent: Secondary | ICD-10-CM | POA: Diagnosis not present

## 2018-01-06 DIAGNOSIS — I2510 Atherosclerotic heart disease of native coronary artery without angina pectoris: Secondary | ICD-10-CM | POA: Insufficient documentation

## 2018-01-06 DIAGNOSIS — Z7984 Long term (current) use of oral hypoglycemic drugs: Secondary | ICD-10-CM | POA: Diagnosis not present

## 2018-01-06 DIAGNOSIS — E119 Type 2 diabetes mellitus without complications: Secondary | ICD-10-CM | POA: Insufficient documentation

## 2018-01-06 DIAGNOSIS — Z955 Presence of coronary angioplasty implant and graft: Secondary | ICD-10-CM | POA: Diagnosis not present

## 2018-01-06 DIAGNOSIS — I10 Essential (primary) hypertension: Secondary | ICD-10-CM | POA: Diagnosis not present

## 2018-01-06 DIAGNOSIS — G4730 Sleep apnea, unspecified: Secondary | ICD-10-CM | POA: Diagnosis not present

## 2018-01-06 DIAGNOSIS — G8918 Other acute postprocedural pain: Secondary | ICD-10-CM | POA: Diagnosis not present

## 2018-01-06 DIAGNOSIS — Z79899 Other long term (current) drug therapy: Secondary | ICD-10-CM | POA: Insufficient documentation

## 2018-01-06 DIAGNOSIS — D176 Benign lipomatous neoplasm of spermatic cord: Secondary | ICD-10-CM | POA: Insufficient documentation

## 2018-01-06 HISTORY — PX: INSERTION OF MESH: SHX5868

## 2018-01-06 HISTORY — PX: INGUINAL HERNIA REPAIR: SHX194

## 2018-01-06 LAB — GLUCOSE, CAPILLARY
Glucose-Capillary: 128 mg/dL — ABNORMAL HIGH (ref 70–99)
Glucose-Capillary: 163 mg/dL — ABNORMAL HIGH (ref 70–99)

## 2018-01-06 SURGERY — REPAIR, HERNIA, INGUINAL, ADULT
Anesthesia: General | Site: Groin | Laterality: Right

## 2018-01-06 MED ORDER — DEXAMETHASONE SODIUM PHOSPHATE 10 MG/ML IJ SOLN
INTRAMUSCULAR | Status: AC
  Filled 2018-01-06: qty 1

## 2018-01-06 MED ORDER — MIDAZOLAM HCL 2 MG/2ML IJ SOLN
INTRAMUSCULAR | Status: AC
  Administered 2018-01-06: 1 mg via INTRAVENOUS
  Filled 2018-01-06: qty 2

## 2018-01-06 MED ORDER — PROPOFOL 10 MG/ML IV BOLUS
INTRAVENOUS | Status: AC
  Filled 2018-01-06: qty 20

## 2018-01-06 MED ORDER — CHLORHEXIDINE GLUCONATE CLOTH 2 % EX PADS: 6.0000 | MEDICATED_PAD | Freq: Once | CUTANEOUS | Status: DC

## 2018-01-06 MED ORDER — ACETAMINOPHEN 500 MG PO TABS
ORAL_TABLET | ORAL | Status: AC
  Filled 2018-01-06: qty 2

## 2018-01-06 MED ORDER — MIDAZOLAM HCL 2 MG/2ML IJ SOLN
1.0000 mg | Freq: Once | INTRAMUSCULAR | Status: AC
  Administered 2018-01-06: 1 mg via INTRAVENOUS

## 2018-01-06 MED ORDER — 0.9 % SODIUM CHLORIDE (POUR BTL) OPTIME
TOPICAL | Status: DC | PRN
  Administered 2018-01-06: 1000 mL

## 2018-01-06 MED ORDER — BUPIVACAINE HCL 0.25 % IJ SOLN
INTRAMUSCULAR | Status: DC | PRN
  Administered 2018-01-06: 20 mL

## 2018-01-06 MED ORDER — HYDROMORPHONE HCL 1 MG/ML IJ SOLN: 0.2500 mg | INTRAMUSCULAR | Status: DC | PRN

## 2018-01-06 MED ORDER — FENTANYL CITRATE (PF) 100 MCG/2ML IJ SOLN
100.0000 ug | Freq: Once | INTRAMUSCULAR | Status: AC
  Administered 2018-01-06: 100 ug via INTRAVENOUS

## 2018-01-06 MED ORDER — FENTANYL CITRATE (PF) 100 MCG/2ML IJ SOLN
INTRAMUSCULAR | Status: AC
  Administered 2018-01-06: 100 ug via INTRAVENOUS
  Filled 2018-01-06: qty 2

## 2018-01-06 MED ORDER — LACTATED RINGERS IV SOLN
INTRAVENOUS | Status: DC | PRN
  Administered 2018-01-06: 11:00:00 via INTRAVENOUS

## 2018-01-06 MED ORDER — LIDOCAINE 2% (20 MG/ML) 5 ML SYRINGE
INTRAMUSCULAR | Status: DC | PRN
  Administered 2018-01-06: 60 mg via INTRAVENOUS

## 2018-01-06 MED ORDER — LACTATED RINGERS IV SOLN
INTRAVENOUS | Status: DC
  Administered 2018-01-06: 08:00:00 via INTRAVENOUS

## 2018-01-06 MED ORDER — DEXAMETHASONE SODIUM PHOSPHATE 10 MG/ML IJ SOLN
INTRAMUSCULAR | Status: DC | PRN
  Administered 2018-01-06: 10 mg via INTRAVENOUS

## 2018-01-06 MED ORDER — PHENYLEPHRINE 40 MCG/ML (10ML) SYRINGE FOR IV PUSH (FOR BLOOD PRESSURE SUPPORT)
PREFILLED_SYRINGE | INTRAVENOUS | Status: AC
  Filled 2018-01-06: qty 10

## 2018-01-06 MED ORDER — FENTANYL CITRATE (PF) 250 MCG/5ML IJ SOLN
INTRAMUSCULAR | Status: DC | PRN
  Administered 2018-01-06 (×2): 50 ug via INTRAVENOUS

## 2018-01-06 MED ORDER — ROCURONIUM BROMIDE 50 MG/5ML IV SOSY
PREFILLED_SYRINGE | INTRAVENOUS | Status: AC
  Filled 2018-01-06: qty 5

## 2018-01-06 MED ORDER — TRAMADOL HCL 50 MG PO TABS: 50.0000 mg | ORAL_TABLET | Freq: Four times a day (QID) | ORAL | 0 refills | Status: DC | PRN

## 2018-01-06 MED ORDER — BUPIVACAINE HCL (PF) 0.25 % IJ SOLN
INTRAMUSCULAR | Status: AC
  Filled 2018-01-06: qty 30

## 2018-01-06 MED ORDER — ACETAMINOPHEN 500 MG PO TABS
1000.0000 mg | ORAL_TABLET | Freq: Once | ORAL | Status: AC
  Administered 2018-01-06: 1000 mg via ORAL
  Filled 2018-01-06: qty 2

## 2018-01-06 MED ORDER — SUGAMMADEX SODIUM 200 MG/2ML IV SOLN
INTRAVENOUS | Status: DC | PRN
  Administered 2018-01-06: 200 mg via INTRAVENOUS

## 2018-01-06 MED ORDER — GABAPENTIN 300 MG PO CAPS
ORAL_CAPSULE | ORAL | Status: AC
  Filled 2018-01-06: qty 1

## 2018-01-06 MED ORDER — ONDANSETRON HCL 4 MG/2ML IJ SOLN
INTRAMUSCULAR | Status: AC
  Filled 2018-01-06: qty 2

## 2018-01-06 MED ORDER — ROCURONIUM BROMIDE 10 MG/ML (PF) SYRINGE
PREFILLED_SYRINGE | INTRAVENOUS | Status: DC | PRN
  Administered 2018-01-06: 50 mg via INTRAVENOUS
  Administered 2018-01-06: 10 mg via INTRAVENOUS

## 2018-01-06 MED ORDER — PROPOFOL 10 MG/ML IV BOLUS
INTRAVENOUS | Status: DC | PRN
  Administered 2018-01-06: 130 mg via INTRAVENOUS

## 2018-01-06 MED ORDER — BUPIVACAINE-EPINEPHRINE (PF) 0.5% -1:200000 IJ SOLN
INTRAMUSCULAR | Status: DC | PRN
  Administered 2018-01-06: 30 mL

## 2018-01-06 MED ORDER — GABAPENTIN 300 MG PO CAPS
300.0000 mg | ORAL_CAPSULE | Freq: Once | ORAL | Status: AC
  Administered 2018-01-06: 300 mg via ORAL
  Filled 2018-01-06: qty 1

## 2018-01-06 MED ORDER — LIDOCAINE 2% (20 MG/ML) 5 ML SYRINGE
INTRAMUSCULAR | Status: AC
  Filled 2018-01-06: qty 5

## 2018-01-06 MED ORDER — PHENYLEPHRINE 40 MCG/ML (10ML) SYRINGE FOR IV PUSH (FOR BLOOD PRESSURE SUPPORT)
PREFILLED_SYRINGE | INTRAVENOUS | Status: DC | PRN
  Administered 2018-01-06: 40 ug via INTRAVENOUS
  Administered 2018-01-06: 80 ug via INTRAVENOUS

## 2018-01-06 MED ORDER — FENTANYL CITRATE (PF) 250 MCG/5ML IJ SOLN
INTRAMUSCULAR | Status: AC
  Filled 2018-01-06: qty 5

## 2018-01-06 MED ORDER — ONDANSETRON HCL 4 MG/2ML IJ SOLN
INTRAMUSCULAR | Status: DC | PRN
  Administered 2018-01-06: 4 mg via INTRAVENOUS

## 2018-01-06 SURGICAL SUPPLY — 48 items
BLADE CLIPPER SURG (BLADE) ×2 IMPLANT
BLADE SURG 10 STRL SS (BLADE) ×3 IMPLANT
BLADE SURG 15 STRL LF DISP TIS (BLADE) ×1 IMPLANT
BLADE SURG 15 STRL SS (BLADE) ×3
CANISTER SUCT 3000ML PPV (MISCELLANEOUS) ×2 IMPLANT
CHLORAPREP W/TINT 26ML (MISCELLANEOUS) ×3 IMPLANT
CLOSURE WOUND 1/2 X4 (GAUZE/BANDAGES/DRESSINGS) ×1
COVER SURGICAL LIGHT HANDLE (MISCELLANEOUS) ×3 IMPLANT
COVER WAND RF STERILE (DRAPES) ×1 IMPLANT
DECANTER SPIKE VIAL GLASS SM (MISCELLANEOUS) ×3 IMPLANT
DRAIN PENROSE 1/2X12 LTX STRL (WOUND CARE) ×2 IMPLANT
DRAPE LAPAROTOMY TRNSV 102X78 (DRAPE) ×3 IMPLANT
DRAPE UTILITY XL STRL (DRAPES) ×3 IMPLANT
ELECT CAUTERY BLADE 6.4 (BLADE) ×1 IMPLANT
ELECT REM PT RETURN 9FT ADLT (ELECTROSURGICAL) ×3
ELECTRODE REM PT RTRN 9FT ADLT (ELECTROSURGICAL) ×1 IMPLANT
GAUZE SPONGE 4X4 12PLY STRL (GAUZE/BANDAGES/DRESSINGS) ×3 IMPLANT
GLOVE SURG ORTHO 8.0 STRL STRW (GLOVE) ×3 IMPLANT
GOWN STRL REUS W/ TWL LRG LVL3 (GOWN DISPOSABLE) ×1 IMPLANT
GOWN STRL REUS W/ TWL XL LVL3 (GOWN DISPOSABLE) ×1 IMPLANT
GOWN STRL REUS W/TWL LRG LVL3 (GOWN DISPOSABLE) ×3
GOWN STRL REUS W/TWL XL LVL3 (GOWN DISPOSABLE) ×3
KIT BASIN OR (CUSTOM PROCEDURE TRAY) ×3 IMPLANT
KIT TURNOVER KIT B (KITS) ×3 IMPLANT
MESH ULTRAPRO 3X6 7.6X15CM (Mesh General) ×2 IMPLANT
NDL HYPO 25GX1X1/2 BEV (NEEDLE) ×1 IMPLANT
NEEDLE HYPO 25GX1X1/2 BEV (NEEDLE) ×3 IMPLANT
NS IRRIG 1000ML POUR BTL (IV SOLUTION) ×3 IMPLANT
PACK SURGICAL SETUP 50X90 (CUSTOM PROCEDURE TRAY) ×3 IMPLANT
PAD ARMBOARD 7.5X6 YLW CONV (MISCELLANEOUS) ×3 IMPLANT
PENCIL BUTTON HOLSTER BLD 10FT (ELECTRODE) ×1 IMPLANT
PENCIL SMOKE EVAC W/HOLSTER (ELECTROSURGICAL) ×2 IMPLANT
SPECIMEN JAR SMALL (MISCELLANEOUS) IMPLANT
SPONGE LAP 18X18 X RAY DECT (DISPOSABLE) ×3 IMPLANT
STRIP CLOSURE SKIN 1/2X4 (GAUZE/BANDAGES/DRESSINGS) ×2 IMPLANT
SUT MNCRL AB 4-0 PS2 18 (SUTURE) ×3 IMPLANT
SUT NOVA NAB GS-22 2 0 T19 (SUTURE) ×6 IMPLANT
SUT SILK 2 0 SH (SUTURE) ×2 IMPLANT
SUT SILK 3 0 (SUTURE) ×3
SUT SILK 3-0 18XBRD TIE 12 (SUTURE) ×1 IMPLANT
SUT VIC AB 3-0 SH 18 (SUTURE) ×3 IMPLANT
SYR BULB 3OZ (MISCELLANEOUS) ×3 IMPLANT
SYR CONTROL 10ML LL (SYRINGE) ×3 IMPLANT
TOWEL OR 17X24 6PK STRL BLUE (TOWEL DISPOSABLE) ×3 IMPLANT
TOWEL OR 17X26 10 PK STRL BLUE (TOWEL DISPOSABLE) ×3 IMPLANT
TUBE CONNECTING 12'X1/4 (SUCTIONS) ×1
TUBE CONNECTING 12X1/4 (SUCTIONS) ×1 IMPLANT
YANKAUER SUCT BULB TIP NO VENT (SUCTIONS) ×2 IMPLANT

## 2018-01-06 NOTE — Anesthesia Preprocedure Evaluation (Addendum)
Anesthesia Evaluation  Patient identified by MRN, date of birth, ID band Patient awake    Reviewed: Allergy & Precautions, H&P , NPO status , Patient's Chart, lab work & pertinent test results  Airway Mallampati: III  TM Distance: >3 FB Neck ROM: Full    Dental no notable dental hx. (+) Teeth Intact, Dental Advisory Given   Pulmonary sleep apnea and Continuous Positive Airway Pressure Ventilation , former smoker,    Pulmonary exam normal breath sounds clear to auscultation       Cardiovascular hypertension, Pt. on medications + CAD, + Past MI and + Cardiac Stents   Rhythm:Regular Rate:Normal     Neuro/Psych negative neurological ROS  negative psych ROS   GI/Hepatic Neg liver ROS, GERD  Medicated and Controlled,  Endo/Other  diabetes, Type 2, Oral Hypoglycemic Agents  Renal/GU negative Renal ROS  negative genitourinary   Musculoskeletal   Abdominal   Peds  Hematology negative hematology ROS (+)   Anesthesia Other Findings   Reproductive/Obstetrics negative OB ROS                           Anesthesia Physical Anesthesia Plan  ASA: III  Anesthesia Plan: General   Post-op Pain Management:  Regional for Post-op pain   Induction: Intravenous  PONV Risk Score and Plan: 3 and Ondansetron, Midazolam and Treatment may vary due to age or medical condition  Airway Management Planned: Oral ETT  Additional Equipment:   Intra-op Plan:   Post-operative Plan: Extubation in OR  Informed Consent: I have reviewed the patients History and Physical, chart, labs and discussed the procedure including the risks, benefits and alternatives for the proposed anesthesia with the patient or authorized representative who has indicated his/her understanding and acceptance.   Dental advisory given  Plan Discussed with: CRNA  Anesthesia Plan Comments:         Anesthesia Quick Evaluation

## 2018-01-06 NOTE — Interval H&P Note (Signed)
History and Physical Interval Note:  01/06/2018 10:02 AM  Justin Carr  has presented today for surgery, with the diagnosis of RIGHT INGUINAL HERNIA  The various methods of treatment have been discussed with the patient and family. After consideration of risks, benefits and other options for treatment, the patient has consented to  Procedure(s): OPEN RIGHT INGUINAL HERNIA REPAIR WITH MESH (Right) INSERTION OF MESH (Right) as a surgical intervention .  The patient's history has been reviewed, patient examined, no change in status, stable for surgery.  I have reviewed the patient's chart and labs.  Questions were answered to the patient's satisfaction.     Dyke Weible Jerilynn Mages

## 2018-01-06 NOTE — Anesthesia Postprocedure Evaluation (Signed)
Anesthesia Post Note  Patient: Justin Carr  Procedure(s) Performed: OPEN RIGHT INGUINAL HERNIA REPAIR WITH MESH (Right Groin) INSERTION OF MESH (Right Groin)     Patient location during evaluation: PACU Anesthesia Type: General and Regional Level of consciousness: awake and alert Pain management: pain level controlled Vital Signs Assessment: post-procedure vital signs reviewed and stable Respiratory status: spontaneous breathing, nonlabored ventilation and respiratory function stable Cardiovascular status: blood pressure returned to baseline and stable Postop Assessment: no apparent nausea or vomiting Anesthetic complications: no    Last Vitals:  Vitals:   01/06/18 1159 01/06/18 1227  BP: 130/65 130/79  Pulse: 68 70  Resp: 18 15  Temp:    SpO2: 94% 94%    Last Pain:  Vitals:   01/06/18 1227  PainSc: 0-No pain                 Elva Mauro,W. EDMOND

## 2018-01-06 NOTE — Op Note (Signed)
Inguinal Hernia, Open, Procedure Note  Pre-operative Diagnosis:  Right inguinal hernia  Post-operative Diagnosis: same  Surgeon:  Earnstine Regal, MD, FACS  Anesthesia:  General  Preparation:  Chlora-prep  Estimated Blood Loss: minimal  Complications:  none  Indications: The patient presented with a right, reducible hernia.   Patient returns to be evaluated for right inguinal hernia repair. Over the past few weeks this has become progressively more symptomatic. Patient notes pain relief when lying supine or prone. However, upon standing, sitting, or engaging in physical activity, he is having more pain. Patient was evaluated by his primary care provider. They felt that the pain was due to the inguinal hernia. Patient contacted his cardiologist and we have received a clearance from Dr. Quay Burow to hold the patient's Plavix for 5 days and continue his baby aspirin so that we may proceed with hernia repair in the near future.    Procedure Details  The patient was evaluated in the holding area. All of the patient's questions were answered and the proposed procedure was confirmed. The site of the procedure was properly marked. The patient was taken to the Operating Room, identified by name, and the procedure verified as inguinal hernia repair.  The patient was placed in the supine position and underwent induction of anesthesia. A "Time Out" was performed per routine. The lower abdomen and groin were prepped and draped in the usual aseptic fashion.  After ascertaining that an adequate level of anesthesia had been obtained, an incision was made in the groin with a #10 blade.  Dissection was carried through the subcutaneous tissues and hemostasis obtained with the electrocautery.  A Gelpi retractor was placed for exposure.  The external oblique fascia was incised in line with it's fibers and extended through the external inguinal ring.  The cord structures were dissected out of the inguinal  canal and encircled with a Penrose drain.  The floor of the inguinal canal was dissected out.  There was no evidence of a direct hernia defect.  The cord was explored and a large lipoma was present extending nearly to the hemiscrotum.  This was dissected out to the level of the internal ring and a high ligation performed with 2-0 silk sutures.  The floor of the inguinal canal was reconstructed with Ethicon Ultrapro mesh cut to the appropriate dimensions.  It was secured to the pubic tubercle with a 2-0 Novafil suture and along the inguinal ligament with a running 2-0 Novafil suture.  Mesh was split to accommodate the cord structures.  The superior margin of the mesh was secured to the transversalis and internal oblique musculature with interrupted 2-0 Novafil sutures.  The tails of the mesh were overlapped lateral to the cord structures and secured to the inguinal ligament with interrupted 2-0 Novafil sutures to recreate the internal inguinal ring.  Cord structures were returned to the inguinal canal.  Local anesthetic was infiltrated throughout the field.  External oblique fascia was closed with interrupted 3-0 Vicryl sutures.  Subcutaneous tissues were closed with interrupted 3-0 Vicryl sutures.  Skin was anesthetized with local anesthetic, and the skin edges were re-approximated with a running 4-0 Monocryl suture.  Wound was washed and dried and steristrips were applied.  A dry gauze dressing was applied.  Instrument, sponge, and needle counts were correct prior to closure and at the conclusion of the case.  The patient tolerated the procedure well.  The patient was awakened from anesthesia and brought to the recovery room in stable condition.  Armandina Gemma, MD [facility] Office: [phone]

## 2018-01-06 NOTE — Transfer of Care (Signed)
Immediate Anesthesia Transfer of Care Note  Patient: Justin Carr  Procedure(s) Performed: OPEN RIGHT INGUINAL HERNIA REPAIR WITH MESH (Right Groin) INSERTION OF MESH (Right Groin)  Patient Location: PACU  Anesthesia Type:General  Level of Consciousness: awake, alert  and oriented  Airway & Oxygen Therapy: Patient Spontanous Breathing and Patient connected to face mask oxygen  Post-op Assessment: Report given to RN and Post -op Vital signs reviewed and stable  Post vital signs: Reviewed and stable  Last Vitals:  Vitals Value Taken Time  BP 150/73 01/06/2018 11:29 AM  Temp    Pulse 71 01/06/2018 11:31 AM  Resp 10 01/06/2018 11:31 AM  SpO2 100 % 01/06/2018 11:31 AM  Vitals shown include unvalidated device data.  Last Pain:  Vitals:   01/06/18 0900  PainSc: 0-No pain         Complications: No apparent anesthesia complications

## 2018-01-06 NOTE — Anesthesia Procedure Notes (Signed)
Procedure Name: Intubation Date/Time: 01/06/2018 10:38 AM Performed by: Teressa Lower., CRNA Pre-anesthesia Checklist: Patient identified, Emergency Drugs available, Suction available and Patient being monitored Patient Re-evaluated:Patient Re-evaluated prior to induction Oxygen Delivery Method: Circle system utilized Preoxygenation: Pre-oxygenation with 100% oxygen Induction Type: IV induction Ventilation: Mask ventilation without difficulty and Oral airway inserted - appropriate to patient size Laryngoscope Size: Sabra Heck and 2 Grade View: Grade II Tube type: Oral Tube size: 7.5 mm Number of attempts: 2 Airway Equipment and Method: Stylet and Oral airway Placement Confirmation: ETT inserted through vocal cords under direct vision,  positive ETCO2 and breath sounds checked- equal and bilateral Secured at: 23 cm Tube secured with: Tape Dental Injury: Teeth and Oropharynx as per pre-operative assessment

## 2018-01-06 NOTE — Anesthesia Procedure Notes (Signed)
Anesthesia Regional Block: TAP block   Pre-Anesthetic Checklist: ,, timeout performed, Correct Patient, Correct Site, Correct Laterality, Correct Procedure, Correct Position, site marked, Risks and benefits discussed, pre-op evaluation,  At surgeon's request and post-op pain management  Laterality: Right  Prep: Maximum Sterile Barrier Precautions used, chloraprep       Needles:  Injection technique: Single-shot  Needle Type: Echogenic Stimulator Needle     Needle Length: 9cm  Needle Gauge: 21     Additional Needles:   Procedures:,,,, ultrasound used (permanent image in chart),,,,  Narrative:  Start time: 01/06/2018 8:49 AM End time: 01/06/2018 8:59 AM Injection made incrementally with aspirations every 5 mL. Anesthesiologist: Roderic Palau, MD  Additional Notes: 2% Lidocaine skin wheel.

## 2018-01-07 ENCOUNTER — Encounter (HOSPITAL_COMMUNITY): Payer: Self-pay | Admitting: Surgery

## 2018-01-14 DIAGNOSIS — Z23 Encounter for immunization: Secondary | ICD-10-CM | POA: Diagnosis not present

## 2018-01-14 DIAGNOSIS — E1165 Type 2 diabetes mellitus with hyperglycemia: Secondary | ICD-10-CM | POA: Diagnosis not present

## 2018-01-14 DIAGNOSIS — E1140 Type 2 diabetes mellitus with diabetic neuropathy, unspecified: Secondary | ICD-10-CM | POA: Diagnosis not present

## 2018-01-14 DIAGNOSIS — E7800 Pure hypercholesterolemia, unspecified: Secondary | ICD-10-CM | POA: Diagnosis not present

## 2018-01-14 DIAGNOSIS — I10 Essential (primary) hypertension: Secondary | ICD-10-CM | POA: Diagnosis not present

## 2018-01-28 DIAGNOSIS — N402 Nodular prostate without lower urinary tract symptoms: Secondary | ICD-10-CM | POA: Diagnosis not present

## 2018-01-29 ENCOUNTER — Telehealth: Payer: Self-pay | Admitting: Cardiovascular Disease

## 2018-01-29 MED ORDER — FAMOTIDINE 20 MG PO TABS: 20.0000 mg | ORAL_TABLET | Freq: Two times a day (BID) | ORAL | 6 refills | Status: DC

## 2018-01-29 NOTE — Telephone Encounter (Signed)
Returned call to patient he stated Zantac has been recalled,he wanted to know what he should take.Advised I will send message to our pharmacist for advice.

## 2018-01-29 NOTE — Telephone Encounter (Signed)
New Message   Pt c/o medication issue:  1. Name of Medication: ranitidine (ZANTAC) 150 MG tablet  2. How are you currently taking this medication (dosage and times per day)? Take 1 tablet (150 mg total) by mouth 2 (two) times daily.  3. Are you having a reaction (difficulty breathing--STAT)? no  4. What is your medication issue? Pt states that he went to his pcp and they informed home of this medication being on a recall due to possible contaminations and the pt wants to know what other options he has. Please call

## 2018-01-29 NOTE — Telephone Encounter (Signed)
Returned call to patient Justin Carr's recommendation given.Stated he wanted stop Zantac.Will start Famotidine 20 mg twice a day.Prescription sent to pharmacy.

## 2018-01-29 NOTE — Telephone Encounter (Signed)
Most of the recall involved over the counter Zantac. He should contact his pharmacy determine if his prescriptions was affected by the recall.   Alternative is famotidine 20mg  po BID

## 2018-02-05 ENCOUNTER — Other Ambulatory Visit: Payer: Self-pay | Admitting: Physician Assistant

## 2018-02-28 ENCOUNTER — Other Ambulatory Visit: Payer: Self-pay | Admitting: Cardiovascular Disease

## 2018-03-05 ENCOUNTER — Ambulatory Visit: Payer: Self-pay | Admitting: Surgery

## 2018-03-11 ENCOUNTER — Ambulatory Visit (INDEPENDENT_AMBULATORY_CARE_PROVIDER_SITE_OTHER): Payer: Medicare Other | Admitting: Cardiovascular Disease

## 2018-03-11 ENCOUNTER — Encounter: Payer: Self-pay | Admitting: Cardiovascular Disease

## 2018-03-11 VITALS — BP 122/60 | HR 56 | Ht 71.0 in | Wt 212.0 lb

## 2018-03-11 DIAGNOSIS — I2510 Atherosclerotic heart disease of native coronary artery without angina pectoris: Secondary | ICD-10-CM

## 2018-03-11 DIAGNOSIS — I10 Essential (primary) hypertension: Secondary | ICD-10-CM

## 2018-03-11 DIAGNOSIS — E7800 Pure hypercholesterolemia, unspecified: Secondary | ICD-10-CM

## 2018-03-11 DIAGNOSIS — I701 Atherosclerosis of renal artery: Secondary | ICD-10-CM | POA: Diagnosis not present

## 2018-03-11 NOTE — Progress Notes (Signed)
03/11/2018 Justin Carr   1945-07-04  [id number]  Primary Physician Shirline Frees, MD Primary Cardiologist: [name] MD FACP, [place], [place], [place]  HPI:  Justin Carr is a 72 y.o.  mildly overweight married Caucasian male, father of 2 children (1 living), who I  I last saw in the office 09/06/2017. 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 His problems include obstructive sleep apnea, on CPAP, hypertension, and hyperlipidemia. He denies chest pain or shortness of breath. He had a non-ST-segment-elevation myocardial infarction, July 02, 2007. He underwent PCI and stenting of his proximal LAD with a Taxus Liberte drug-eluting stent. He had also had a 60% right renal artery stenosis documented angiographically at that time, which we have been following by duplex ultrasound. Since I saw him back a year ago he denies chest pain or shortness of breathbut has noticed some left upper extremity discomfort with exertion similar to his pre-MI symptoms..Recent lipid profile performed by his PCP3/29/18 revealing a total cholesterol 113, LDL 48 and HDL of 35. he underwent Myoview stress testing 04/04/17 that showed ischemia in the LAD territory. I performed radial diagnostic cath on him 04/11/17 revealed a 99% "in-stent restenosis" within the proximal LAD stent. Remainder of his coronary anatomy was free of significant disease in his LV function was normal. I restarted him with a 3 mm x 20 mm long synergy drug-eluting stent postdilated to 3.23 mm resulting reduction a 9% stenosis to 0% residual. He has done well since.  Since I saw him in the office back in June he is done well specifically denying chest pain or shortness of breath.  He did undergo an uncomplicated right inguinal hernia repair by Dr.Gerki n 01/07/2018 and apparently needs another abdominal operation in the near future because of a  mass in his appendix.   Current Meds  Medication Sig  . aspirin 81 MG tablet Take 1 tablet (81 mg total) by mouth daily. (Patient taking differently: Take 81 mg by mouth at bedtime. )  . BENFOTIAMINE PO Take 1 tablet by mouth 2 (two) times daily.  . clopidogrel (PLAVIX) 75 MG tablet Take 1 tablet (75 mg total) by mouth daily.  . Coenzyme Q10 (COQ-10 PO) Take 1 capsule by mouth at bedtime.   . dapagliflozin propanediol (FARXIGA) 5 MG TABS tablet Take 5 mg by mouth daily.  . Emollient (COCOA BUTTER EX) Apply 1 application topically 2 (two) times daily.  . famotidine (PEPCID) 20 MG tablet Take 1 tablet (20 mg total) by mouth 2 (two) times daily.  Marland Kitchen gabapentin (NEURONTIN) 300 MG capsule Take 1 capsule (300 mg total) by mouth 3 (three) times daily. (Patient taking differently: Take 300 mg by mouth at bedtime. )  . metFORMIN (GLUCOPHAGE-XR) 500 MG 24 hr tablet Take 1,000 mg by mouth 2 (two) times daily.  . Multiple Vitamin (MULTIVITAMIN WITH MINERALS) TABS tablet Take 1 tablet by mouth daily.  . Multiple Vitamins-Minerals (PRESERVISION AREDS PO) Take 1 capsule by mouth 2 (two) times daily.   . nitroGLYCERIN (NITROSTAT) 0.4 MG SL tablet DISSOLVE ONE TABLET UNDER THE TONGUE EVERY 5 MINUTES AS NEEDED FOR CHEST PAIN. (Patient taking differently: Place 0.4 mg under the tongue every 5 (five) minutes as needed for chest pain. )  . ramipril (ALTACE) 10 MG capsule TAKE 1 CAPSULE BY MOUTH ONCE DAILY (Patient taking differently: Take 10 mg by mouth daily. )  .  rosuvastatin (CRESTOR) 20 MG tablet  TAKE 1 TABLET BY MOUTH 3 TIMES A WEEK (Patient taking differently: Take 20 mg by mouth 3 (three) times a week. )  . sildenafil (REVATIO) 20 MG tablet Take 20-100 mg by mouth as needed (for erectile disfunction).   . traMADol (ULTRAM) 50 MG tablet Take 1-2 tablets (50-100 mg total) by mouth every 6 (six) hours as needed for moderate pain.     Allergies  Allergen Reactions  . Peanuts [Peanut Oil] Anaphylaxis  .  Lipitor [Atorvastatin] Other (See Comments)    Muscle cramps  . Rosuvastatin Other (See Comments)    Muscle cramps if taken daily     Social History   Socioeconomic History  . Marital status: Married    Spouse name: Not on file  . Number of children: 2  . Years of education: College +  . Highest education level: Not on file  Occupational History  . Occupation: Retired  Scientific laboratory technician  . Financial resource strain: Not hard at all  . Food insecurity:    Worry: Never true    Inability: Never true  . Transportation needs:    Medical: No    Non-medical: No  Tobacco Use  . Smoking status: Former Smoker    Packs/day: 1.00    Years: 30.00    Pack years: 30.00    Types: Cigarettes    Last attempt to quit: 07/03/2006    Years since quitting: 11.6  . Smokeless tobacco: Never Used  Substance and Sexual Activity  . Alcohol use: Yes    Comment: 04/11/2017 "might have a beer q other week"  . Drug use: No  . Sexual activity: Not Currently  Lifestyle  . Physical activity:    Days per week: 3 days    Minutes per session: 30 min  . Stress: Not at all  Relationships  . Social connections:    Talks on phone: Not on file    Gets together: Not on file    Attends religious service: Not on file    Active member of club or organization: Not on file    Attends meetings of clubs or organizations: Not on file    Relationship status: Not on file  . Intimate partner violence:    Fear of current or ex partner: Not on file    Emotionally abused: Not on file    Physically abused: Not on file    Forced sexual activity: Not on file  Other Topics Concern  . Not on file  Social History Narrative   1 cup coffee and 2 sodas per day.   Lives at home with his wife.   Right-handed.     Review of Systems: General: negative for chills, fever, night sweats or weight changes.  Cardiovascular: negative for chest pain, dyspnea on exertion, edema, orthopnea, palpitations, paroxysmal nocturnal dyspnea or  shortness of breath Dermatological: negative for rash Respiratory: negative for cough or wheezing Urologic: negative for hematuria Abdominal: negative for nausea, vomiting, diarrhea, bright red blood per rectum, melena, or hematemesis Neurologic: negative for visual changes, syncope, or dizziness All other systems reviewed and are otherwise negative except as noted above.    Blood pressure 122/60, pulse (!) 56, height 5\' 11"  (1.803 m), weight 212 lb (96.2 kg).  General appearance: alert and no distress Neck: no adenopathy, no carotid bruit, no JVD, supple, symmetrical, trachea midline and thyroid not enlarged, symmetric, no tenderness/mass/nodules Lungs: clear to auscultation bilaterally Heart: regular rate and rhythm, S1, S2 normal,  no murmur, click, rub or gallop Extremities: extremities normal, atraumatic, no cyanosis or edema Pulses: 2+ and symmetric Skin: Skin color, texture, turgor normal. No rashes or lesions Neurologic: Alert and oriented X 3, normal strength and tone. Normal symmetric reflexes. Normal coordination and gait  EKG sinus bradycardia 56 without ST or T wave changes.  I personally reviewed this EKG.  ASSESSMENT AND PLAN:   Coronary artery disease History of CAD status post non-STEMI 07/02/2007.  I performed PCI and stenting of his proximal LAD with a Taxus Liberte drug-eluting stent.  He also had a 60% right renal artery stenosis document angiographically at that time.  He underwent Myoview stress testing 04/04/2017 that showed ischemia in the LAD territory which led to heart cath on 04/11/2017 revealing 99% "in-stent restenosis within the proximal LAD stent.  I restented him with a 3 mm x 20 mm long Synergy drug-eluting stent postdilated to 3.23 mm with an excellent result.  He has been asymptomatic since.  He is scheduled for surgery by Dr. Harlow Asa in the upcoming future.  He can stop his Plavix for this.  Essential hypertension History of essential hypertension with  blood pressure measured today 122/60.  He is on ramipril.  Continue current meds at current dosing.  Hyperlipidemia History of hyperlipidemia on statin therapy followed by his PCP      [name] MD [facility], [facility] 03/11/2018 9:40 AM

## 2018-03-11 NOTE — Assessment & Plan Note (Signed)
History of hyperlipidemia on statin therapy followed by his PCP 

## 2018-03-11 NOTE — Patient Instructions (Signed)
Medication Instructions:  Your physician recommends that you continue on your current medications as directed. Please refer to the Current Medication list given to you today.  If you need a refill on your cardiac medications before your next appointment, please call your pharmacy.   Lab work: NONE If you have labs (blood work) drawn today and your tests are completely normal, you will receive your results only by: . MyChart Message (if you have MyChart) OR . A paper copy in the mail If you have any lab test that is abnormal or we need to change your treatment, we will call you to review the results.  Testing/Procedures: NONE  Follow-Up: At [facility], you and your health needs are our priority.  As part of our continuing mission to provide you with exceptional heart care, we have created designated Provider Care Teams.  These Care Teams include your primary Cardiologist (physician) and Advanced Practice Providers (APPs -  Physician Assistants and Nurse Practitioners) who all work together to provide you with the care you need, when you need it. You will need a follow up appointment in 12 months.  Please call our office 2 months in advance to schedule this appointment.  You may see Jonathan Berry, MD or one of the following Advanced Practice Providers on your designated Care Team:   Luke Kilroy, PA-C Krista Kroeger, PA-C . Callie Goodrich, PA-C     

## 2018-03-11 NOTE — Assessment & Plan Note (Signed)
History of essential hypertension with blood pressure measured today 122/60.  He is on ramipril.  Continue current meds at current dosing.

## 2018-03-11 NOTE — Assessment & Plan Note (Signed)
History of CAD status post non-STEMI 07/02/2007.  I performed PCI and stenting of his proximal LAD with a Taxus Liberte drug-eluting stent.  He also had a 60% right renal artery stenosis document angiographically at that time.  He underwent Myoview stress testing 04/04/2017 that showed ischemia in the LAD territory which led to heart cath on 04/11/2017 revealing 99% "in-stent restenosis within the proximal LAD stent.  I restented him with a 3 mm x 20 mm long Synergy drug-eluting stent postdilated to 3.23 mm with an excellent result.  He has been asymptomatic since.  He is scheduled for surgery by Dr. Harlow Asa in the upcoming future.  He can stop his Plavix for this.

## 2018-03-14 NOTE — Patient Instructions (Addendum)
Justin Carr  03/14/2018   Your procedure is scheduled on: 04-01-18    Report to [facility] Main  Entrance    Report to Admitting at 5:30 AM    Call this number if you have problems the morning of surgery [phone]    Remember: Do not eat food or drink liquids :After Midnight.     BRUSH YOUR TEETH MORNING OF SURGERY AND RINSE YOUR MOUTH OUT, NO CHEWING GUM CANDY OR MINTS.     Take these medicines the morning of surgery with A SIP OF WATER: Famotidine (Pepcid)    DO NOT TAKE ANY DIABETIC MEDICATIONS DAY OF YOUR SURGERY                               You may not have any metal on your body including hair pins and              piercings  Do not wear jewelry, make-up, lotions, powders or perfumes, deodorant             Do not wear nail polish.  Do not shave  48 hours prior to surgery.     Do not bring valuables to the hospital. Justin Carr.  Contacts, dentures or bridgework may not be worn into surgery.  Leave suitcase in the car. After surgery it may be brought to your room.      Special Instructions: Please bring your mask and tubing for your CPAP machine              Please read over the following fact sheets you were given: _____________________________________________________________________  How to Manage Your Diabetes Before and After Surgery  Why is it important to control my blood sugar before and after surgery? . Improving blood sugar levels before and after surgery helps healing and can limit problems. . A way of improving blood sugar control is eating a healthy diet by: o  Eating less sugar and carbohydrates o  Increasing activity/exercise o  Talking with your doctor about reaching your blood sugar goals . High blood sugars (greater than 180 mg/dL) can raise your risk of infections and slow your recovery, so you will need to focus on controlling your diabetes during the weeks before  surgery. . Make sure that the doctor who takes care of your diabetes knows about your planned surgery including the date and location.  How do I manage my blood sugar before surgery? . Check your blood sugar at least 4 times a day, starting 2 days before surgery, to make sure that the level is not too high or low. o Check your blood sugar the morning of your surgery when you wake up and every 2 hours until you get to the Short Stay unit. . If your blood sugar is less than 70 mg/dL, you will need to treat for low blood sugar: o Do not take insulin. o Treat a low blood sugar (less than 70 mg/dL) with  cup of clear juice (cranberry or apple), 4 glucose tablets, OR glucose gel. o Recheck blood sugar in 15 minutes after treatment (to make sure it is greater than 70 mg/dL). If your blood sugar is not greater than 70 mg/dL on recheck, call [phone] for further  instructions. . Report your blood sugar to the short stay nurse when you get to Short Stay.  . If you are admitted to the hospital after surgery: o Your blood sugar will be checked by the staff and you will probably be given insulin after surgery (instead of oral diabetes medicines) to make sure you have good blood sugar levels. o The goal for blood sugar control after surgery is 80-180 mg/dL.   WHAT DO I DO ABOUT MY DIABETES MEDICATION?  Marland Kitchen Do not take oral diabetes medicines (pills) the morning of surgery.  . THE DAY BEFORE SURGERY, take your usual Metformin           Reviewed and Endorsed by [facility] Patient Education Committee, August 2015           [facility] - Preparing for Surgery Before surgery, you can play an important role.  Because skin is not sterile, your skin needs to be as free of germs as possible.  You can reduce the number of germs on your skin by washing with CHG (chlorahexidine gluconate) soap before surgery.  CHG is an antiseptic cleaner which kills germs and bonds with the skin to continue killing germs  even after washing. Please DO NOT use if you have an allergy to CHG or antibacterial soaps.  If your skin becomes reddened/irritated stop using the CHG and inform your nurse when you arrive at Short Stay. Do not shave (including legs and underarms) for at least 48 hours prior to the first CHG shower.  You may shave your face/neck. Please follow these instructions carefully:  1.  Shower with CHG Soap the night before surgery and the  morning of Surgery.  2.  If you choose to wash your hair, wash your hair first as usual with your  normal  shampoo.  3.  After you shampoo, rinse your hair and body thoroughly to remove the  shampoo.                           4.  Use CHG as you would any other liquid soap.  You can apply chg directly  to the skin and wash                       Gently with a scrungie or clean washcloth.  5.  Apply the CHG Soap to your body ONLY FROM THE NECK DOWN.   Do not use on face/ open                           Wound or open sores. Avoid contact with eyes, ears mouth and genitals (private parts).                       Wash face,  Genitals (private parts) with your normal soap.             6.  Wash thoroughly, paying special attention to the area where your surgery  will be performed.  7.  Thoroughly rinse your body with warm water from the neck down.  8.  DO NOT shower/wash with your normal soap after using and rinsing off  the CHG Soap.                9.  Pat yourself dry with a clean towel.            10.  Wear clean pajamas.            11.  Place clean sheets on your bed the night of your first shower and do not  sleep with pets. Day of Surgery : Do not apply any lotions/deodorants the morning of surgery.  Please wear clean clothes to the hospital/surgery center.  FAILURE TO FOLLOW THESE INSTRUCTIONS MAY RESULT IN THE CANCELLATION OF YOUR SURGERY PATIENT SIGNATURE_________________________________  NURSE  SIGNATURE__________________________________  ________________________________________________________________________

## 2018-03-14 NOTE — Progress Notes (Addendum)
03-11-18 (Epic) Cardiac Clearance from Dr. [name]  03-12-18 (Epic) ECHO  09-20-17 (Epic) CXR  04-04-17 (Epic) Stress Test  Per pt, he is scheduled to stop the Plavix 5 days prior to surgery, and to continue the ASA

## 2018-03-17 ENCOUNTER — Encounter (HOSPITAL_COMMUNITY)
Admission: RE | Admit: 2018-03-17 | Discharge: 2018-03-17 | Disposition: A | Discharge status: Home / Self Care | Payer: Medicare Other | Source: Ambulatory Visit | Attending: Surgery | Admitting: Surgery

## 2018-03-17 ENCOUNTER — Other Ambulatory Visit: Payer: Self-pay

## 2018-03-17 ENCOUNTER — Encounter (HOSPITAL_COMMUNITY): Payer: Self-pay

## 2018-03-17 DIAGNOSIS — Z01812 Encounter for preprocedural laboratory examination: Secondary | ICD-10-CM | POA: Insufficient documentation

## 2018-03-17 DIAGNOSIS — C181 Malignant neoplasm of appendix: Secondary | ICD-10-CM | POA: Insufficient documentation

## 2018-03-17 LAB — BASIC METABOLIC PANEL
Anion gap: 8 (ref 5–15)
BUN: 14 mg/dL (ref 8–23)
CO2: 26 mmol/L (ref 22–32)
CREATININE: 0.88 mg/dL (ref 0.61–1.24)
Calcium: 8.8 mg/dL — ABNORMAL LOW (ref 8.9–10.3)
Chloride: 104 mmol/L (ref 98–111)
GFR calc Af Amer: >60 mL/min (ref >60)
GFR calc non Af Amer: >60 mL/min (ref >60)
Glucose, Bld: 152 mg/dL — ABNORMAL HIGH (ref 70–99)
Potassium: 4.6 mmol/L (ref 3.5–5.1)
SODIUM: 138 mmol/L (ref 135–145)

## 2018-03-17 LAB — CBC
HCT: 44.6 % (ref 39.0–52.0)
Hemoglobin: 14.9 g/dL (ref 13.0–17.0)
MCH: 32.7 pg (ref 26.0–34.0)
MCHC: 33.4 g/dL (ref 30.0–36.0)
MCV: 97.8 fL (ref 80.0–100.0)
Platelets: 184 10*3/uL (ref 150–400)
RBC: 4.56 MIL/uL (ref 4.22–5.81)
RDW: 13.1 % (ref 11.5–15.5)
WBC: 6.9 10*3/uL (ref 4.0–10.5)
nRBC: 0 % (ref 0.0–0.2)

## 2018-03-17 LAB — HEMOGLOBIN A1C
Hgb A1c MFr Bld: 6.5 % — ABNORMAL HIGH (ref 4.8–5.6)
Mean Plasma Glucose: 139.85 mg/dL

## 2018-03-17 LAB — GLUCOSE, CAPILLARY: Glucose-Capillary: 152 mg/dL — ABNORMAL HIGH (ref 70–99)

## 2018-03-25 DIAGNOSIS — Z125 Encounter for screening for malignant neoplasm of prostate: Secondary | ICD-10-CM | POA: Diagnosis not present

## 2018-03-25 DIAGNOSIS — Z7984 Long term (current) use of oral hypoglycemic drugs: Secondary | ICD-10-CM | POA: Diagnosis not present

## 2018-03-25 DIAGNOSIS — Z0000 Encounter for general adult medical examination without abnormal findings: Secondary | ICD-10-CM | POA: Diagnosis not present

## 2018-03-25 DIAGNOSIS — E782 Mixed hyperlipidemia: Secondary | ICD-10-CM | POA: Diagnosis not present

## 2018-03-25 DIAGNOSIS — N529 Male erectile dysfunction, unspecified: Secondary | ICD-10-CM | POA: Diagnosis not present

## 2018-03-25 DIAGNOSIS — E1169 Type 2 diabetes mellitus with other specified complication: Secondary | ICD-10-CM | POA: Diagnosis not present

## 2018-03-25 DIAGNOSIS — I10 Essential (primary) hypertension: Secondary | ICD-10-CM | POA: Diagnosis not present

## 2018-03-26 IMAGING — CR DG RIBS W/ CHEST 3+V*L*
3 series · 3 of 3 positions shown · non-contrast
Comparison: 03/25/2013

CLINICAL DATA: Left rib pain, no known injury, initial encounter

EXAM:
LEFT RIBS AND CHEST - 3+ VIEW

[w chest pa]
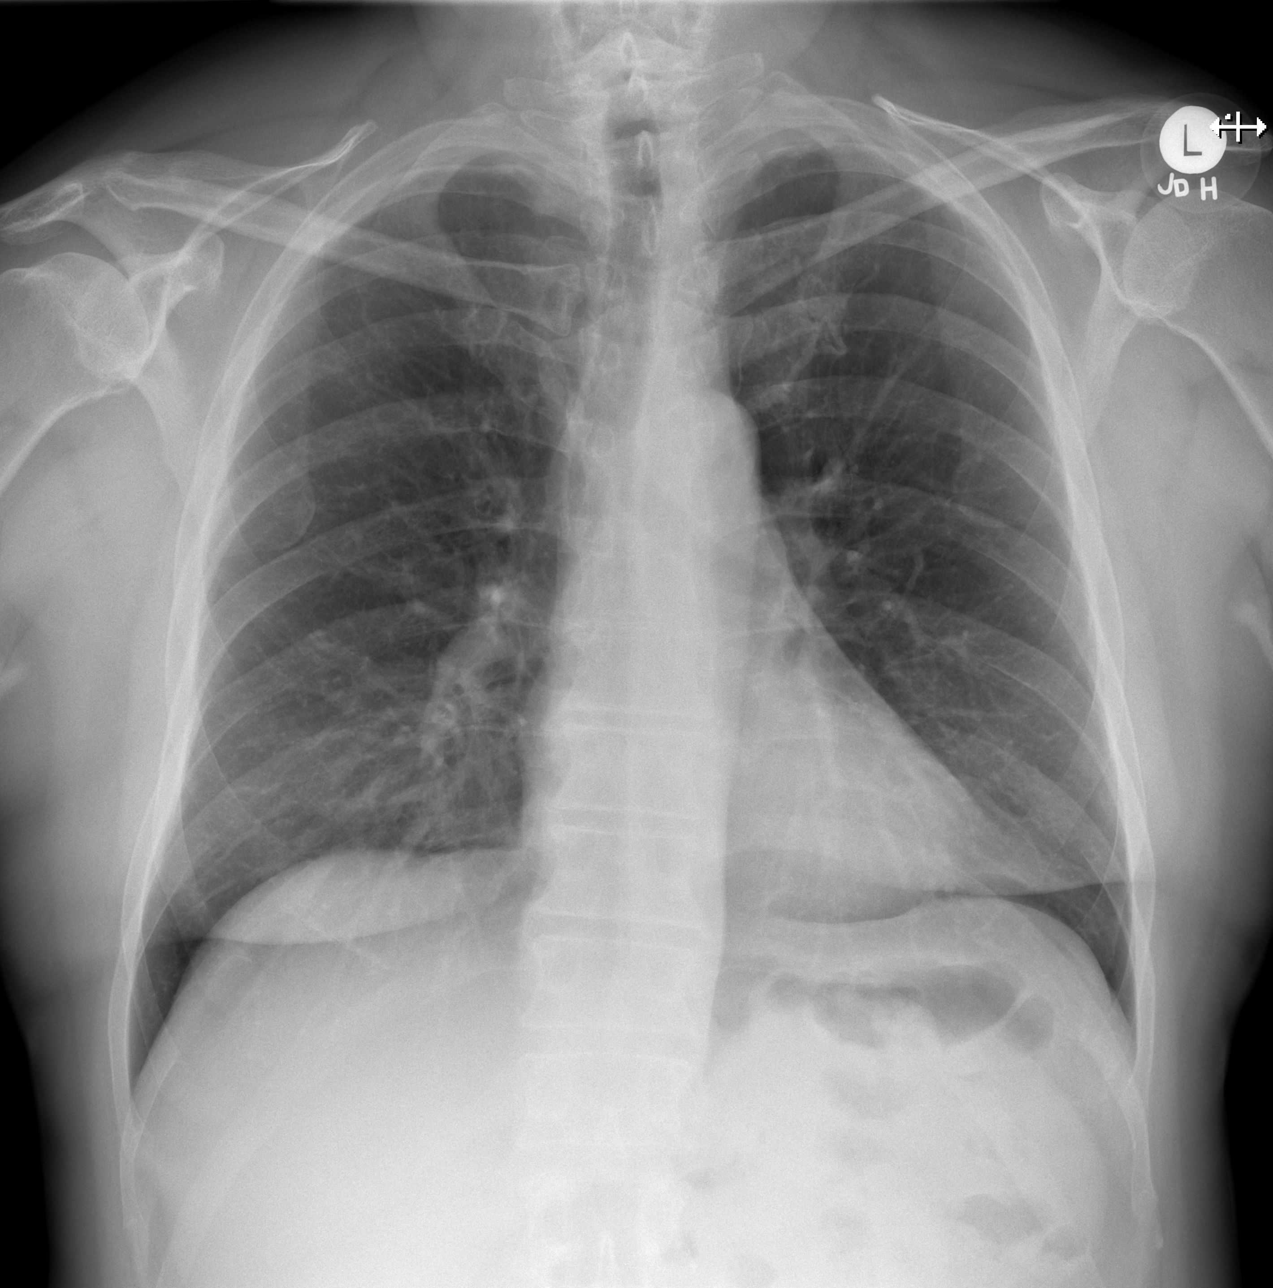

[w ribs ap lower left]
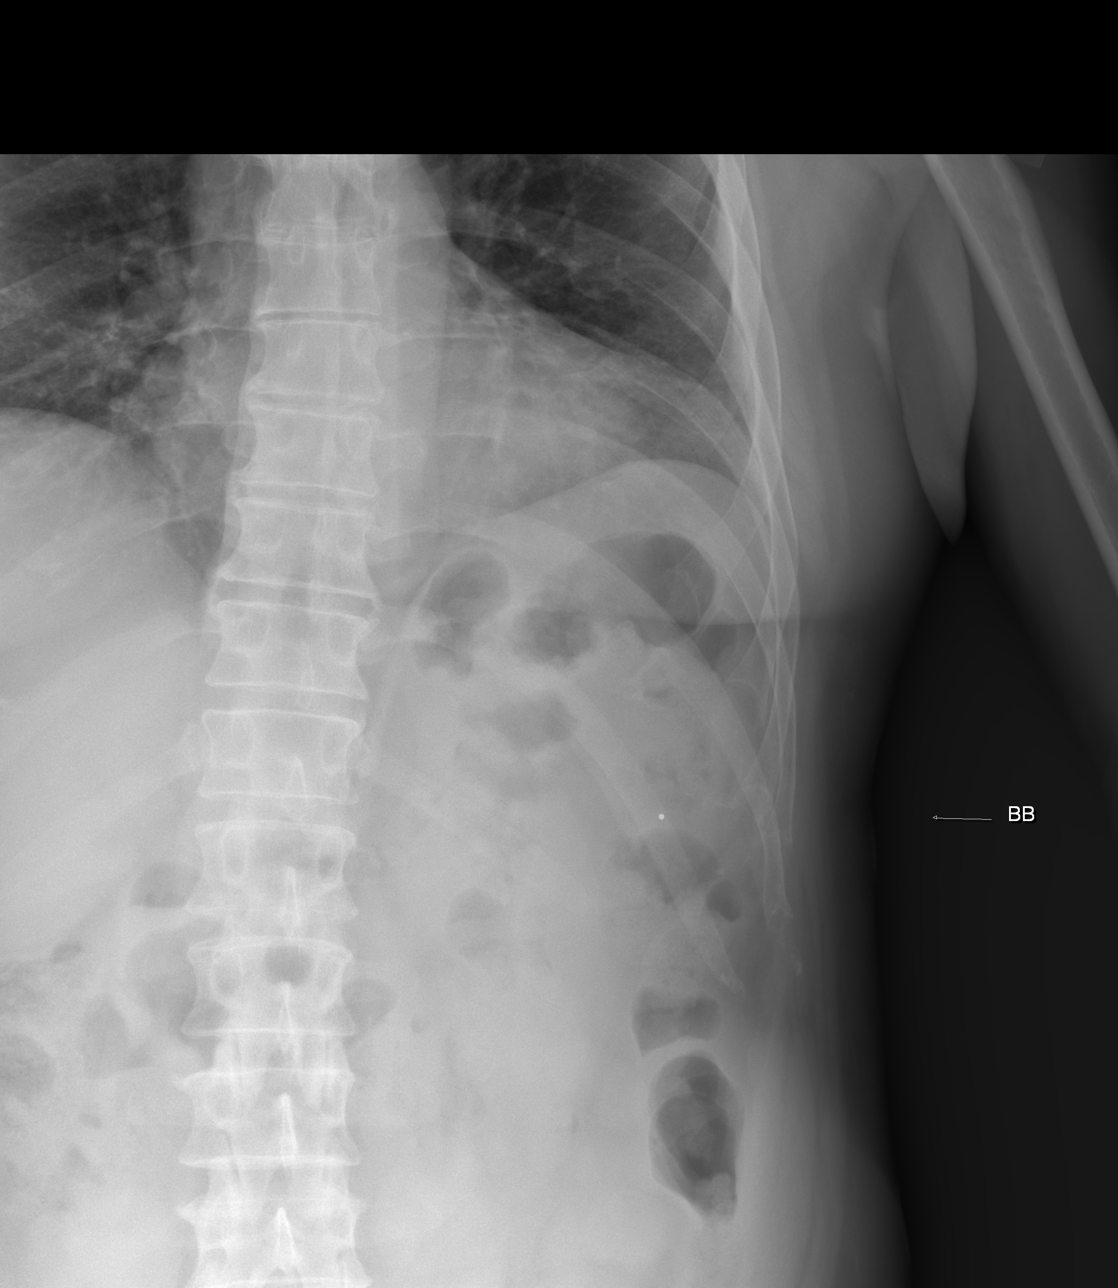

[w ribs obl left]
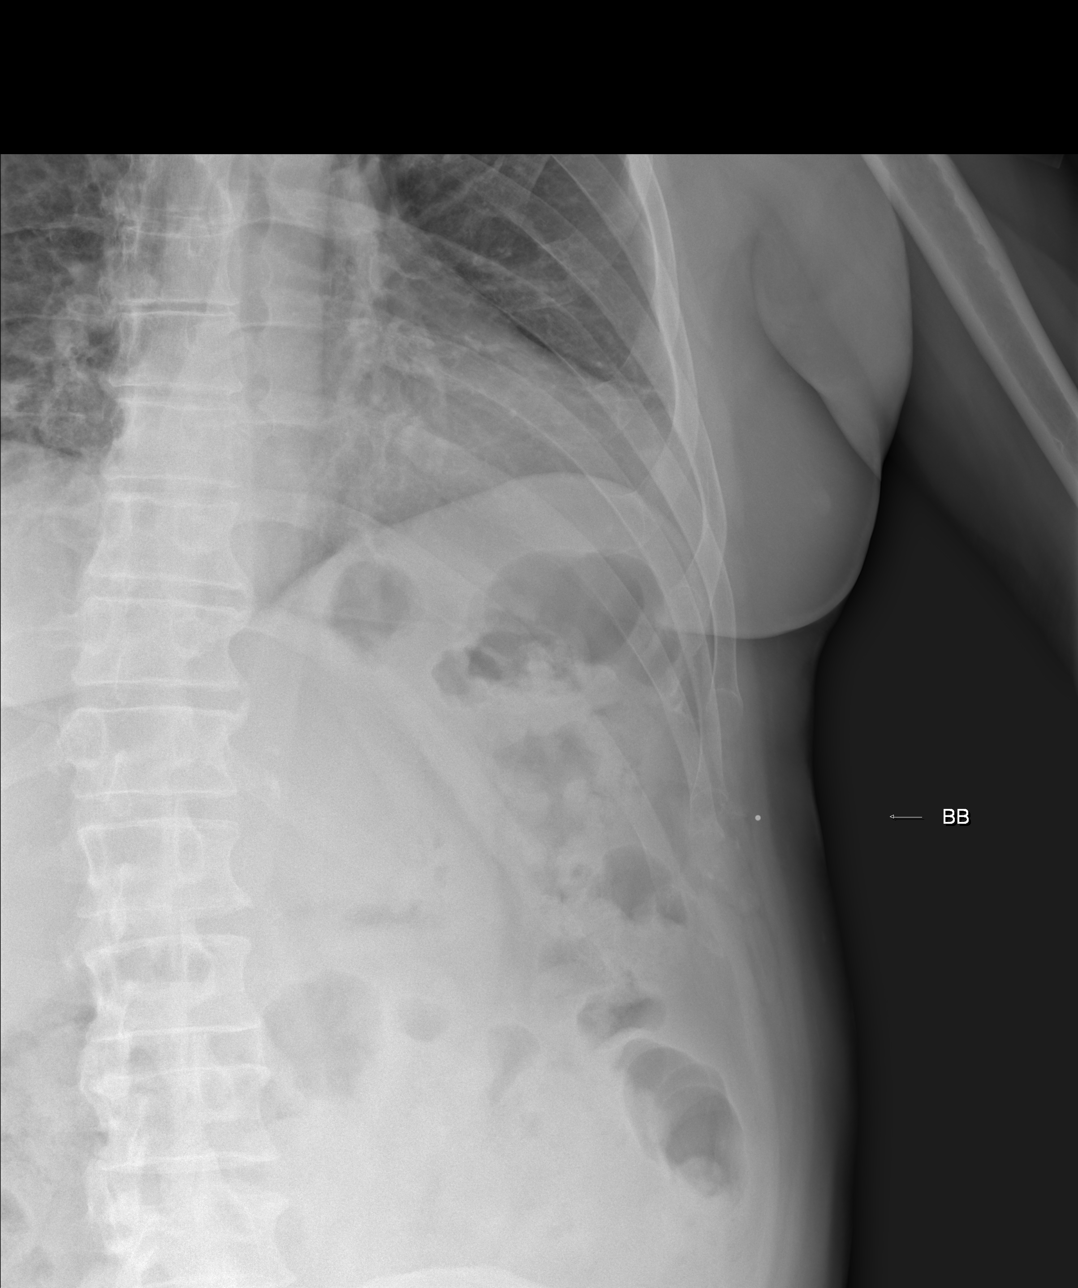

[3 of 3 positions shown; findings below may reference images not displayed]

FINDINGS: Cardiac shadow is within normal limits. The lungs are well aerated
bilaterally. No pneumothorax or sizable effusion is seen. No
evidence of rib fracture is identified. Mild degenerative changes in
the thoracic spine are seen.
IMPRESSION: No rib fracture identified.

## 2018-03-31 ENCOUNTER — Encounter (HOSPITAL_COMMUNITY): Payer: Self-pay | Admitting: Surgery

## 2018-03-31 DIAGNOSIS — D490 Neoplasm of unspecified behavior of digestive system: Secondary | ICD-10-CM | POA: Diagnosis present

## 2018-03-31 MED ORDER — SODIUM CHLORIDE 0.9 % IV SOLN
1.0000 g | INTRAVENOUS | Status: AC
  Administered 2018-04-01: 1 g via INTRAVENOUS
  Filled 2018-03-31: qty 1

## 2018-03-31 NOTE — H&P (Signed)
General Surgery [facility]  Justin Carr DOB: 08/20/1945 Married / Language: English / Race: White Male   History of Present Illness  The patient is a 73 year old male presenting for a post-operative visit.  CHIEF COMPLAINT: post op inguinal hernia repair, appendiceal mass  Patient returns for follow-up having undergone right inguinal hernia repair with mesh. Postoperative course has been uneventful and he has had no complications. He is having minimal discomfort. He is return to all of his activities.  Patient has a cystic neoplasm of the appendix which will require appendectomy for definitive diagnosis and management. Patient would like to proceed with that in early January.   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 multiple Colon Polyp Removal - Colonoscopy  Oral Surgery  Shoulder Surgery  Right. Tonsillectomy   Diagnostic Studies History  Colonoscopy  1-5 years ago  Allergies  Peanuts  Diarrhea. Allergies Reconciled  No Known Drug Allergies [11/01/2017]:  Medication History Coenzyme Q10 (60MG  Tablet, Oral) Active. Ramipril (10MG  Capsule, Oral) Active. Sildenafil Citrate (20MG  Tablet, Oral) Active. Rosuvastatin Calcium (20MG  Tablet, Oral) Active. MetFORMIN HCl ER (500MG  Tablet ER 24HR, Oral) Active. Gabapentin (300MG  Capsule, Oral) Active. Clopidogrel Bisulfate (75MG  Tablet, Oral) Active. Famotidine (20MG  Tablet, Oral) Active. Medications Reconciled  Social History  Alcohol use  Occasional alcohol use. Caffeine use  Coffee. No drug use  Tobacco use  Former smoker.  Family History Colon Cancer  Mother. Heart Disease  Father. Hypertension  Father. Thyroid problems  Mother.  Other Problems Chest pain  Diabetes Mellitus  High blood  pressure  Hypercholesterolemia  Myocardial infarction  Sleep Apnea   Vitals Weight: 214.25 lb Height: 71in Body Surface Area: 2.17 m Body Mass Index: 29.88 kg/m  Temp.: 97.54F  Pulse: 88 (Regular)  P.OX: 98% (Room air) BP: 138/82 (Sitting, Left Arm, Standard)  Physical Exam   See vital signs recorded above  GENERAL APPEARANCE Development: normal Nutritional status: normal Gross deformities: none  SKIN Rash, lesions, ulcers: none Induration, erythema: none Nodules: none palpable  EYES Conjunctiva and lids: normal Pupils: equal and reactive Iris: normal bilaterally  EARS, NOSE, MOUTH, THROAT External ears: no lesion or deformity External nose: no lesion or deformity Hearing: grossly normal Lips: no lesion or deformity Dentition: normal for age Oral mucosa: moist  NECK Symmetric: yes Trachea: midline Thyroid: no palpable nodules in the thyroid bed  CHEST Respiratory effort: normal Retraction or accessory muscle use: no Breath sounds: normal bilaterally Rales, rhonchi, wheeze: none  CARDIOVASCULAR Auscultation: regular rhythm, normal rate Murmurs: none Pulses: carotid and radial pulse 2+ palpable Lower extremity edema: none Lower extremity varicosities: none  ABDOMEN Distension: none Masses: none palpable Tenderness: none Hepatosplenomegaly: not present Hernia: not present  GENITOURINARY Penis: no lesions Scrotum: no masses Well-healed incision right groin. Minimal soft tissue swelling. With palpation in the inguinal canal during cough and Valsalva, there is no sign of recurrent hernia.  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  Patient has done well from his right inguinal hernia repair. He is had no complications.  Patient would like to proceed with appendectomy in early January. We discussed the procedure. We plan laparoscopic appendectomy. We will schedule an overnight hospital stay if needed. We discussed restrictions on his activities after the surgery.  The risks and benefits of the procedure have been discussed at length with the patient. The patient understands the proposed procedure, potential alternative treatments, and the course of recovery to be expected. All of the patient's questions have been answered at this time. The patient wishes to proceed with surgery.  Armandina Gemma, [facility] Office: [phone]

## 2018-04-01 ENCOUNTER — Other Ambulatory Visit: Payer: Self-pay

## 2018-04-01 ENCOUNTER — Observation Stay (HOSPITAL_COMMUNITY)
Admission: RE | Admit: 2018-04-01 | Discharge: 2018-04-02 | Disposition: A | Discharge status: Home / Self Care | Payer: Medicare Other | Attending: Surgery | Admitting: Surgery

## 2018-04-01 ENCOUNTER — Encounter (HOSPITAL_COMMUNITY): Payer: Self-pay

## 2018-04-01 ENCOUNTER — Ambulatory Visit (HOSPITAL_COMMUNITY): Payer: Medicare Other | Admitting: Anesthesiology

## 2018-04-01 ENCOUNTER — Encounter (HOSPITAL_COMMUNITY)
Admission: RE | Disposition: A | Discharge status: Home / Self Care | Payer: Self-pay | Source: Home / Self Care | Attending: Surgery

## 2018-04-01 DIAGNOSIS — R030 Elevated blood-pressure reading, without diagnosis of hypertension: Secondary | ICD-10-CM | POA: Diagnosis not present

## 2018-04-01 DIAGNOSIS — E1140 Type 2 diabetes mellitus with diabetic neuropathy, unspecified: Secondary | ICD-10-CM | POA: Diagnosis not present

## 2018-04-01 DIAGNOSIS — E119 Type 2 diabetes mellitus without complications: Secondary | ICD-10-CM | POA: Insufficient documentation

## 2018-04-01 DIAGNOSIS — Z91010 Allergy to peanuts: Secondary | ICD-10-CM | POA: Insufficient documentation

## 2018-04-01 DIAGNOSIS — Z87891 Personal history of nicotine dependence: Secondary | ICD-10-CM | POA: Insufficient documentation

## 2018-04-01 DIAGNOSIS — Z7984 Long term (current) use of oral hypoglycemic drugs: Secondary | ICD-10-CM | POA: Insufficient documentation

## 2018-04-01 DIAGNOSIS — Z79899 Other long term (current) drug therapy: Secondary | ICD-10-CM | POA: Insufficient documentation

## 2018-04-01 DIAGNOSIS — I252 Old myocardial infarction: Secondary | ICD-10-CM | POA: Diagnosis not present

## 2018-04-01 DIAGNOSIS — D490 Neoplasm of unspecified behavior of digestive system: Principal | ICD-10-CM | POA: Insufficient documentation

## 2018-04-01 DIAGNOSIS — I2510 Atherosclerotic heart disease of native coronary artery without angina pectoris: Secondary | ICD-10-CM | POA: Diagnosis not present

## 2018-04-01 DIAGNOSIS — Z7902 Long term (current) use of antithrombotics/antiplatelets: Secondary | ICD-10-CM | POA: Diagnosis not present

## 2018-04-01 DIAGNOSIS — D373 Neoplasm of uncertain behavior of appendix: Secondary | ICD-10-CM | POA: Diagnosis not present

## 2018-04-01 DIAGNOSIS — I10 Essential (primary) hypertension: Secondary | ICD-10-CM | POA: Diagnosis not present

## 2018-04-01 DIAGNOSIS — E7800 Pure hypercholesterolemia, unspecified: Secondary | ICD-10-CM | POA: Insufficient documentation

## 2018-04-01 DIAGNOSIS — C181 Malignant neoplasm of appendix: Secondary | ICD-10-CM | POA: Diagnosis not present

## 2018-04-01 HISTORY — PX: LAPAROSCOPIC APPENDECTOMY: SHX408

## 2018-04-01 LAB — GLUCOSE, CAPILLARY
Glucose-Capillary: 188 mg/dL — ABNORMAL HIGH (ref 70–99)
Glucose-Capillary: 170 mg/dL — ABNORMAL HIGH (ref 70–99)

## 2018-04-01 SURGERY — APPENDECTOMY, LAPAROSCOPIC
Anesthesia: General

## 2018-04-01 MED ORDER — ONDANSETRON HCL 4 MG/2ML IJ SOLN: 4.0000 mg | Freq: Four times a day (QID) | INTRAMUSCULAR | Status: DC | PRN

## 2018-04-01 MED ORDER — ONDANSETRON HCL 4 MG/2ML IJ SOLN
INTRAMUSCULAR | Status: AC
  Filled 2018-04-01: qty 2

## 2018-04-01 MED ORDER — CHLORHEXIDINE GLUCONATE CLOTH 2 % EX PADS: 6.0000 | MEDICATED_PAD | Freq: Once | CUTANEOUS | Status: DC

## 2018-04-01 MED ORDER — ROCURONIUM BROMIDE 10 MG/ML (PF) SYRINGE
PREFILLED_SYRINGE | INTRAVENOUS | Status: DC | PRN
  Administered 2018-04-01: 50 mg via INTRAVENOUS

## 2018-04-01 MED ORDER — GABAPENTIN 300 MG PO CAPS
300.0000 mg | ORAL_CAPSULE | Freq: Every day | ORAL | Status: DC
  Administered 2018-04-01: 300 mg via ORAL
  Filled 2018-04-01: qty 1

## 2018-04-01 MED ORDER — SUGAMMADEX SODIUM 200 MG/2ML IV SOLN
INTRAVENOUS | Status: DC | PRN
  Administered 2018-04-01: 190 mg via INTRAVENOUS

## 2018-04-01 MED ORDER — DEXAMETHASONE SODIUM PHOSPHATE 10 MG/ML IJ SOLN
INTRAMUSCULAR | Status: DC | PRN
  Administered 2018-04-01: 10 mg via INTRAVENOUS

## 2018-04-01 MED ORDER — OXYCODONE HCL 5 MG PO TABS
ORAL_TABLET | ORAL | Status: AC
  Filled 2018-04-01: qty 1

## 2018-04-01 MED ORDER — ROCURONIUM BROMIDE 10 MG/ML (PF) SYRINGE
PREFILLED_SYRINGE | INTRAVENOUS | Status: AC
  Filled 2018-04-01: qty 10

## 2018-04-01 MED ORDER — FENTANYL CITRATE (PF) 100 MCG/2ML IJ SOLN
INTRAMUSCULAR | Status: AC
  Filled 2018-04-01: qty 2

## 2018-04-01 MED ORDER — FENTANYL CITRATE (PF) 100 MCG/2ML IJ SOLN
25.0000 ug | INTRAMUSCULAR | Status: DC | PRN
  Administered 2018-04-01 (×2): 50 ug via INTRAVENOUS

## 2018-04-01 MED ORDER — TRAMADOL HCL 50 MG PO TABS: 50.0000 mg | ORAL_TABLET | Freq: Four times a day (QID) | ORAL | Status: DC | PRN

## 2018-04-01 MED ORDER — LACTATED RINGERS IR SOLN
Status: DC | PRN
  Administered 2018-04-01: 1000 mL

## 2018-04-01 MED ORDER — ONDANSETRON 4 MG PO TBDP: 4.0000 mg | ORAL_TABLET | Freq: Four times a day (QID) | ORAL | Status: DC | PRN

## 2018-04-01 MED ORDER — ONDANSETRON HCL 4 MG/2ML IJ SOLN
INTRAMUSCULAR | Status: DC | PRN
  Administered 2018-04-01: 4 mg via INTRAVENOUS

## 2018-04-01 MED ORDER — KCL IN DEXTROSE-NACL 20-5-0.45 MEQ/L-%-% IV SOLN
INTRAVENOUS | Status: DC
  Administered 2018-04-01: 13:00:00 via INTRAVENOUS
  Filled 2018-04-01 (×2): qty 1000

## 2018-04-01 MED ORDER — METFORMIN HCL ER 500 MG PO TB24
1000.0000 mg | ORAL_TABLET | Freq: Two times a day (BID) | ORAL | Status: DC
  Administered 2018-04-01 – 2018-04-02 (×2): 1000 mg via ORAL
  Filled 2018-04-01 (×2): qty 2

## 2018-04-01 MED ORDER — DEXAMETHASONE SODIUM PHOSPHATE 10 MG/ML IJ SOLN
INTRAMUSCULAR | Status: AC
  Filled 2018-04-01: qty 1

## 2018-04-01 MED ORDER — PROPOFOL 10 MG/ML IV BOLUS
INTRAVENOUS | Status: AC
  Filled 2018-04-01: qty 20

## 2018-04-01 MED ORDER — ESMOLOL HCL 100 MG/10ML IV SOLN
INTRAVENOUS | Status: AC
  Filled 2018-04-01: qty 10

## 2018-04-01 MED ORDER — HYDROCODONE-ACETAMINOPHEN 5-325 MG PO TABS: 1.0000 | ORAL_TABLET | ORAL | Status: DC | PRN

## 2018-04-01 MED ORDER — ACETAMINOPHEN 325 MG PO TABS: 650.0000 mg | ORAL_TABLET | Freq: Four times a day (QID) | ORAL | Status: DC | PRN

## 2018-04-01 MED ORDER — LIDOCAINE 2% (20 MG/ML) 5 ML SYRINGE
INTRAMUSCULAR | Status: AC
  Filled 2018-04-01: qty 5

## 2018-04-01 MED ORDER — ACETAMINOPHEN 650 MG RE SUPP: 650.0000 mg | Freq: Four times a day (QID) | RECTAL | Status: DC | PRN

## 2018-04-01 MED ORDER — LIDOCAINE 2% (20 MG/ML) 5 ML SYRINGE
INTRAMUSCULAR | Status: DC | PRN
  Administered 2018-04-01: 50 mg via INTRAVENOUS

## 2018-04-01 MED ORDER — OXYCODONE HCL 5 MG PO TABS
5.0000 mg | ORAL_TABLET | Freq: Once | ORAL | Status: AC | PRN
  Administered 2018-04-01: 5 mg via ORAL

## 2018-04-01 MED ORDER — FENTANYL CITRATE (PF) 100 MCG/2ML IJ SOLN
INTRAMUSCULAR | Status: DC | PRN
  Administered 2018-04-01: 50 ug via INTRAVENOUS
  Administered 2018-04-01: 100 ug via INTRAVENOUS
  Administered 2018-04-01 (×2): 50 ug via INTRAVENOUS

## 2018-04-01 MED ORDER — PHENYLEPHRINE 40 MCG/ML (10ML) SYRINGE FOR IV PUSH (FOR BLOOD PRESSURE SUPPORT)
PREFILLED_SYRINGE | INTRAVENOUS | Status: DC | PRN
  Administered 2018-04-01: 80 ug via INTRAVENOUS

## 2018-04-01 MED ORDER — ONDANSETRON HCL 4 MG/2ML IJ SOLN: 4.0000 mg | Freq: Once | INTRAMUSCULAR | Status: DC | PRN

## 2018-04-01 MED ORDER — EPHEDRINE SULFATE-NACL 50-0.9 MG/10ML-% IV SOSY
PREFILLED_SYRINGE | INTRAVENOUS | Status: DC | PRN
  Administered 2018-04-01: 5 mg via INTRAVENOUS

## 2018-04-01 MED ORDER — LACTATED RINGERS IV SOLN
INTRAVENOUS | Status: DC
  Administered 2018-04-01: 07:00:00 via INTRAVENOUS

## 2018-04-01 MED ORDER — OXYCODONE HCL 5 MG/5ML PO SOLN: 5.0000 mg | Freq: Once | ORAL | Status: AC | PRN

## 2018-04-01 MED ORDER — HYDROMORPHONE HCL 1 MG/ML IJ SOLN: 1.0000 mg | INTRAMUSCULAR | Status: DC | PRN

## 2018-04-01 MED ORDER — FENTANYL CITRATE (PF) 250 MCG/5ML IJ SOLN
INTRAMUSCULAR | Status: AC
  Filled 2018-04-01: qty 5

## 2018-04-01 MED ORDER — 0.9 % SODIUM CHLORIDE (POUR BTL) OPTIME
TOPICAL | Status: DC | PRN
  Administered 2018-04-01: 1000 mL

## 2018-04-01 MED ORDER — SUGAMMADEX SODIUM 200 MG/2ML IV SOLN
INTRAVENOUS | Status: AC
  Filled 2018-04-01: qty 2

## 2018-04-01 MED ORDER — PROPOFOL 10 MG/ML IV BOLUS
INTRAVENOUS | Status: DC | PRN
  Administered 2018-04-01: 150 mg via INTRAVENOUS

## 2018-04-01 MED ORDER — RAMIPRIL 10 MG PO CAPS
10.0000 mg | ORAL_CAPSULE | Freq: Every day | ORAL | Status: DC
  Administered 2018-04-01 – 2018-04-02 (×2): 10 mg via ORAL
  Filled 2018-04-01 (×2): qty 1

## 2018-04-01 MED ORDER — BUPIVACAINE-EPINEPHRINE (PF) 0.25% -1:200000 IJ SOLN
INTRAMUSCULAR | Status: AC
  Filled 2018-04-01: qty 30

## 2018-04-01 MED ORDER — BUPIVACAINE-EPINEPHRINE 0.25% -1:200000 IJ SOLN
INTRAMUSCULAR | Status: DC | PRN
  Administered 2018-04-01: 20 mL

## 2018-04-01 SURGICAL SUPPLY — 40 items
ADH SKN CLS APL DERMABOND .7 (GAUZE/BANDAGES/DRESSINGS) ×1
APPLIER CLIP ROT 10 11.4 M/L (STAPLE)
APR CLP MED LRG 11.4X10 (STAPLE)
BAG SPEC RTRVL LRG 6X4 10 (ENDOMECHANICALS) ×1
CHLORAPREP W/TINT 26ML (MISCELLANEOUS) ×3 IMPLANT
CLIP APPLIE ROT 10 11.4 M/L (STAPLE) IMPLANT
CLOSURE WOUND 1/2 X4 (GAUZE/BANDAGES/DRESSINGS) ×1
COVER SURGICAL LIGHT HANDLE (MISCELLANEOUS) ×3 IMPLANT
COVER WAND RF STERILE (DRAPES) ×2 IMPLANT
CUTTER FLEX LINEAR 45M (STAPLE) ×2 IMPLANT
DERMABOND ADVANCED (GAUZE/BANDAGES/DRESSINGS) ×2
DERMABOND ADVANCED .7 DNX12 (GAUZE/BANDAGES/DRESSINGS) IMPLANT
DRAPE LAPAROSCOPIC ABDOMINAL (DRAPES) ×3 IMPLANT
ELECT REM PT RETURN 15FT ADLT (MISCELLANEOUS) ×3 IMPLANT
ENDOLOOP SUT PDS II  0 18 (SUTURE)
ENDOLOOP SUT PDS II 0 18 (SUTURE) IMPLANT
GAUZE SPONGE 2X2 8PLY STRL LF (GAUZE/BANDAGES/DRESSINGS) IMPLANT
GLOVE BIOGEL PI IND STRL 6.5 (GLOVE) IMPLANT
GLOVE BIOGEL PI INDICATOR 6.5 (GLOVE) ×2
GLOVE SURG ORTHO 8.0 STRL STRW (GLOVE) ×3 IMPLANT
GLOVE SURG SS PI 6.0 STRL IVOR (GLOVE) ×2 IMPLANT
GOWN STRL REUS W/TWL XL LVL3 (GOWN DISPOSABLE) ×6 IMPLANT
KIT BASIN OR (CUSTOM PROCEDURE TRAY) ×3 IMPLANT
POUCH SPECIMEN RETRIEVAL 10MM (ENDOMECHANICALS) ×3 IMPLANT
RELOAD 45 VASCULAR/THIN (ENDOMECHANICALS) IMPLANT
RELOAD STAPLE 45 2.5 WHT GRN (ENDOMECHANICALS) IMPLANT
RELOAD STAPLE 45 3.5 BLU ETS (ENDOMECHANICALS) IMPLANT
RELOAD STAPLE TA45 3.5 REG BLU (ENDOMECHANICALS) ×3 IMPLANT
SET IRRIG TUBING LAPAROSCOPIC (IRRIGATION / IRRIGATOR) ×3 IMPLANT
SET TUBE SMOKE EVAC HIGH FLOW (TUBING) ×2 IMPLANT
SHEARS HARMONIC ACE PLUS 36CM (ENDOMECHANICALS) ×3 IMPLANT
SPONGE GAUZE 2X2 STER 10/PKG (GAUZE/BANDAGES/DRESSINGS) ×2
STRIP CLOSURE SKIN 1/2X4 (GAUZE/BANDAGES/DRESSINGS) ×2 IMPLANT
SUT MNCRL AB 4-0 PS2 18 (SUTURE) ×3 IMPLANT
TOWEL OR 17X26 10 PK STRL BLUE (TOWEL DISPOSABLE) ×3 IMPLANT
TOWEL OR NON WOVEN STRL DISP B (DISPOSABLE) ×3 IMPLANT
TRAY FOLEY MTR SLVR 16FR STAT (SET/KITS/TRAYS/PACK) ×2 IMPLANT
TRAY LAPAROSCOPIC (CUSTOM PROCEDURE TRAY) ×3 IMPLANT
TROCAR XCEL BLUNT TIP 100MML (ENDOMECHANICALS) ×3 IMPLANT
TROCAR XCEL NON-BLD 11X100MML (ENDOMECHANICALS) ×3 IMPLANT

## 2018-04-01 NOTE — Op Note (Signed)
OPERATIVE REPORT - LAPAROSCOPIC APPENDECTOMY  Preop diagnosis:  Cystic neoplasm of the appendix  Postop diagnosis:  same  Procedure:  Laparoscopic appendectomy  Surgeon:  Armandina Gemma, MD  Anesthesia:  general endotracheal  Estimated blood loss:  minimal  Preparation:  Chlora-prep  Complications:  none  Indications:  Patient is a 73 yo WM with a cystic neoplasm of the appendix found by CT scan.  Now for appendectomy for definitive diagnosis and management.  Procedure:  Patient is brought to the operating room and placed in a supine position on the operating room table. Following administration of general anesthesia, a time out was held and the patient's name and procedure is confirmed. Patient is then prepped and draped in the usual strict aseptic fashion.  After ascertaining that an adequate level of anesthesia has been achieved, a peri-umbilical incision is made with a #15 blade. Dissection is carried down to the fascia. Fascia is incised in the midline and the peritoneal cavity is entered cautiously. A #0-vicryl pursestring suture is placed in the fascia. An Hassan cannula is introduced under direct vision and secured with the pursestring suture. The abdomen is insufflated with carbon dioxide. The laparoscope is introduced and the abdomen is explored. Operative ports are placed in the right upper quadrant and left lower quadrant. The appendix is identified.  The base appears normal.  There is a cystic appearing mass involving the distal 50% of the appendix.  The mesoappendix is divided with the harmonic scalpel. Dissection is carried down to the base of the appendix. The base of the appendix is dissected out clearing the junction with the cecal wall. Using an Endo-GIA stapler, the base of the appendix is transected at the junction with the cecal wall. There is good approximation of tissue along the staple line. There is good hemostasis along the staple line. The appendix is placed into an  endo-catch bag and withdrawn through the umbilical port. The #0-vicryl pursestring suture is tied securely.  Right lower quadrant is irrigated with warm saline which is evacuated. Good hemostasis is noted. Ports are removed under direct vision. Good hemostasis is noted at the port sites. Pneumoperitoneum is released.  Skin incisions are anesthetized with local anesthetic. Wounds are closed with interrupted 4-0 Monocryl subcuticular sutures. Wounds are washed and dried and Dermabond was applied. The patient is awakened from anesthesia and brought to the recovery room. The patient tolerated the procedure well.  Armandina Gemma, MD [facility] Office: [phone]

## 2018-04-01 NOTE — Anesthesia Preprocedure Evaluation (Signed)
Anesthesia Evaluation   Patient awake    Reviewed: Allergy & Precautions, NPO status , Patient's Chart, lab work & pertinent test results  Airway Mallampati: II  TM Distance: >3 FB Neck ROM: Full    Dental  (+) Teeth Intact, Dental Advisory Given   Pulmonary former smoker,    breath sounds clear to auscultation       Cardiovascular hypertension,  Rhythm:Regular Rate:Normal     Neuro/Psych    GI/Hepatic   Endo/Other  diabetes  Renal/GU      Musculoskeletal   Abdominal   Peds  Hematology   Anesthesia Other Findings   Reproductive/Obstetrics                             Anesthesia Physical Anesthesia Plan  ASA: III  Anesthesia Plan: General   Post-op Pain Management:    Induction: Intravenous  PONV Risk Score and Plan: Ondansetron  Airway Management Planned:   Additional Equipment:   Intra-op Plan:   Post-operative Plan: Extubation in OR  Informed Consent: I have reviewed the patients History and Physical, chart, labs and discussed the procedure including the risks, benefits and alternatives for the proposed anesthesia with the patient or authorized representative who has indicated his/her understanding and acceptance.   Dental advisory given  Plan Discussed with: CRNA and Anesthesiologist  Anesthesia Plan Comments:         Anesthesia Quick Evaluation

## 2018-04-01 NOTE — Interval H&P Note (Signed)
History and Physical Interval Note:  04/01/2018 7:18 AM  Justin Carr  has presented today for surgery, with the diagnosis of cystic neoplasm of the appendix.  The various methods of treatment have been discussed with the patient and family. After consideration of risks, benefits and other options for treatment, the patient has consented to    Procedure(s): APPENDECTOMY LAPAROSCOPIC (N/A) as a surgical intervention .    The patient's history has been reviewed, patient examined, no change in status, stable for surgery.  I have reviewed the patient's chart and labs.  Questions were answered to the patient's satisfaction.    Armandina Gemma, [facility] Office: [place]

## 2018-04-01 NOTE — Transfer of Care (Signed)
Immediate Anesthesia Transfer of Care Note  Patient: Justin Carr  Procedure(s) Performed: Procedure(s): APPENDECTOMY LAPAROSCOPIC (N/A)  Patient Location: PACU  Anesthesia Type:General  Level of Consciousness:  sedated, patient cooperative and responds to stimulation  Airway & Oxygen Therapy:Patient Spontanous Breathing and Patient connected to face mask oxgen  Post-op Assessment:  Report given to PACU RN and Post -op Vital signs reviewed and stable  Post vital signs:  Reviewed and stable  Last Vitals:  Vitals:   04/01/18 0558  BP: 140/72  Pulse: 63  Resp: 16  Temp: 36.8 C  SpO2: 91%    Complications: No apparent anesthesia complications

## 2018-04-01 NOTE — Anesthesia Postprocedure Evaluation (Signed)
Anesthesia Post Note  Patient: Justin Carr  Procedure(s) Performed: APPENDECTOMY LAPAROSCOPIC (N/A )     Patient location during evaluation: PACU Anesthesia Type: General Level of consciousness: awake and alert Pain management: pain level controlled Vital Signs Assessment: post-procedure vital signs reviewed and stable Respiratory status: spontaneous breathing, nonlabored ventilation, respiratory function stable and patient connected to nasal cannula oxygen Cardiovascular status: blood pressure returned to baseline and stable Postop Assessment: no apparent nausea or vomiting Anesthetic complications: no    Last Vitals:  Vitals:   04/01/18 1516 04/01/18 1716  BP: (!) 149/69 (!) 143/65  Pulse: 72 69  Resp: 18 17  Temp: 36.8 C 36.5 C  SpO2: 98% 95%    Last Pain:  Vitals:   04/01/18 1716  TempSrc: Oral  PainSc:                  Marlen Mollica,[place] COKER

## 2018-04-01 NOTE — Anesthesia Procedure Notes (Addendum)
Procedure Name: Intubation Date/Time: 04/01/2018 7:38 AM Performed by: Anne Fu, CRNA Pre-anesthesia Checklist: Patient identified, Emergency Drugs available, Suction available, Patient being monitored and Timeout performed Patient Re-evaluated:Patient Re-evaluated prior to induction Oxygen Delivery Method: Circle system utilized Preoxygenation: Pre-oxygenation with 100% oxygen Induction Type: IV induction Ventilation: Mask ventilation without difficulty Laryngoscope Size: Mac and 4 Grade View: Grade II Tube type: Oral Tube size: 7.5 mm Number of attempts: 1 Airway Equipment and Method: Stylet Placement Confirmation: ETT inserted through vocal cords under direct vision,  positive ETCO2 and breath sounds checked- equal and bilateral Secured at: 21 cm Tube secured with: Tape Dental Injury: Teeth and Oropharynx as per pre-operative assessment

## 2018-04-02 ENCOUNTER — Encounter (HOSPITAL_COMMUNITY): Payer: Self-pay | Admitting: Surgery

## 2018-04-02 DIAGNOSIS — I252 Old myocardial infarction: Secondary | ICD-10-CM | POA: Diagnosis not present

## 2018-04-02 DIAGNOSIS — D490 Neoplasm of unspecified behavior of digestive system: Secondary | ICD-10-CM | POA: Diagnosis not present

## 2018-04-02 DIAGNOSIS — E119 Type 2 diabetes mellitus without complications: Secondary | ICD-10-CM | POA: Diagnosis not present

## 2018-04-02 DIAGNOSIS — Z7984 Long term (current) use of oral hypoglycemic drugs: Secondary | ICD-10-CM | POA: Diagnosis not present

## 2018-04-02 DIAGNOSIS — R030 Elevated blood-pressure reading, without diagnosis of hypertension: Secondary | ICD-10-CM | POA: Diagnosis not present

## 2018-04-02 DIAGNOSIS — E7800 Pure hypercholesterolemia, unspecified: Secondary | ICD-10-CM | POA: Diagnosis not present

## 2018-04-02 NOTE — Discharge Summary (Signed)
Physician Discharge Summary [facility]  Patient ID: Justin Carr MRN: [id number] DOB/AGE: 30-Oct-1945 73 y.o.  Admit date: 04/01/2018 Discharge date: 04/02/2018  Admission Diagnoses:  Appendiceal neoplasm  Discharge Diagnoses:  Principal Problem:   Neoplasm of appendix   Discharged Condition: good  Hospital Course: Patient was admitted for observation following laparoscopic surgery.  Post op course was uncomplicated.  Pain was well controlled.  Tolerated diet.  Patient was prepared for discharge home on POD#1.  Consults: None  Treatments: surgery: laparoscopic appendectomy  Discharge Exam: Blood pressure 128/61, pulse (!) 59, temperature 97.6 F (36.4 C), temperature source Oral, resp. rate 13, height 5\' 11"  (1.803 m), weight 94.3 kg, SpO2 99 %. HEENT - clear Neck - soft Chest - clear bilaterally Cor - RRR Abd - soft, mild distension; wounds dry and intact  Disposition: Home  Discharge Instructions    Diet - low sodium heart healthy   Complete by:  As directed    Discharge instructions   Complete by:  As directed    [facility]  LAPAROSCOPIC SURGERY:  POST-OP INSTRUCTIONS  Always review your discharge instruction sheet given to you by the facility where your surgery was performed.  A prescription for pain medication may be given to you upon discharge.  Take your pain medication as prescribed.  If narcotic pain medicine is not needed, then you may take acetaminophen (Tylenol) or ibuprofen (Advil) as needed.  Take your usually prescribed medications unless otherwise directed.  If you need a refill on your pain medication, please contact your pharmacy.  They will contact our office to request authorization. Prescriptions will not be filled after 5 P.M. or on weekends.  You should follow a light diet the first few days after arrival home, such as soup and crackers or toast.  Be sure to include plenty of fluids daily.  Most  patients will experience some swelling and bruising in the area of the incisions.  Ice packs will help.  Swelling and bruising can take several days to resolve.   It is common to experience some constipation after surgery.  Increasing fluid intake and taking a stool softener (such as Colace) will usually help or prevent this problem from occurring.  A mild laxative (Milk of Magnesia or Miralax) should be taken according to package instructions if there has been no bowel movement after 48 hours.  You will have steri-strips and a gauze dressing over your incisions.  You may remove the gauze bandage on the second day after surgery, and you may shower at that time.  Leave your steri-strips (small skin tapes) in place directly over the incision.  These strips should remain on the skin for 5-7 days and then be removed.  You may get them wet in the shower and pat them dry.  Any sutures or staples will be removed at the office during your follow-up visit.  ACTIVITIES:  You may resume regular (light) daily activities beginning the next day - such as daily self-care, walking, climbing stairs - gradually increasing activities as tolerated.  You may have sexual intercourse when it is comfortable.  Refrain from any heavy lifting or straining until approved by your doctor.  You may drive when you are no longer taking prescription pain medication, you can comfortably wear a seatbelt, and you can safely maneuver your car and apply brakes.  You should see your doctor in the office for a follow-up appointment approximately 2-3 weeks after your surgery.  Make sure that you call for this appointment within a day or two after you arrive home to insure a convenient appointment time.  WHEN TO CALL YOUR DOCTOR: Fever over 101.0 Inability to urinate Continued bleeding from incision Increased pain, redness, or drainage from the incision Increasing abdominal pain  The clinic staff is available to answer your questions  during regular business hours.  Please don't hesitate to call and ask to speak to one of the nurses for clinical concerns.  If you have a medical emergency, go to the nearest emergency room or call 911.  A surgeon from [facility] is always on call for the hospital.  Earnstine Regal, MD, [facility] Office: [place] Free:  [place] [phone]  Website: [email or web address]   Increase activity slowly   Complete by:  As directed    No dressing needed   Complete by:  As directed      Allergies as of 04/02/2018      Reactions   Peanuts [peanut Oil] Anaphylaxis   Lipitor [atorvastatin] Other (See Comments)   Muscle cramps   Rosuvastatin Other (See Comments)   Muscle cramps if taken daily-pt still takes drug, but takes 3 times a week.      Medication List    TAKE these medications   aspirin 81 MG tablet Take 1 tablet (81 mg total) by mouth daily. What changed:  when to take this   BENFOTIAMINE PO Take 1 tablet by mouth 2 (two) times daily.   clopidogrel 75 MG tablet Commonly known as:  PLAVIX Take 1 tablet (75 mg total) by mouth daily.   COCOA BUTTER EX Apply 1 application topically 2 (two) times daily.   COQ-10 PO Take 1 capsule by mouth at bedtime.   famotidine 20 MG tablet Commonly known as:  PEPCID Take 1 tablet (20 mg total) by mouth 2 (two) times daily.   FARXIGA 5 MG Tabs tablet Generic drug:  dapagliflozin propanediol Take 5 mg by mouth daily.   gabapentin 300 MG capsule Commonly known as:  NEURONTIN Take 1 capsule (300 mg total) by mouth 3 (three) times daily. What changed:  when to take this   metFORMIN 500 MG 24 hr tablet Commonly known as:  GLUCOPHAGE-XR Take 1,000 mg by mouth 2 (two) times daily.   multivitamin with minerals Tabs tablet Take 1 tablet by mouth daily.   nitroGLYCERIN 0.4 MG SL tablet Commonly known as:  NITROSTAT DISSOLVE ONE TABLET UNDER THE TONGUE EVERY 5  MINUTES AS NEEDED FOR CHEST PAIN. What changed:  See the new instructions.   PRESERVISION AREDS PO Take 1 capsule by mouth 2 (two) times daily.   ramipril 10 MG capsule Commonly known as:  ALTACE TAKE 1 CAPSULE BY MOUTH ONCE DAILY   rosuvastatin 20 MG tablet Commonly known as:  CRESTOR TAKE 1 TABLET BY MOUTH 3 TIMES A WEEK What changed:  See the new instructions.   sildenafil 20 MG tablet Commonly known as:  REVATIO Take 20-100 mg by mouth as needed (for erectile disfunction).   traMADol 50 MG tablet Commonly known as:  ULTRAM Take 1-2 tablets (50-100 mg total) by mouth every 6 (six) hours as needed for moderate pain.      Follow-up Information    Armandina Gemma, MD. Schedule an appointment as soon as possible for a visit in 3 week(s).   Specialty:  General Surgery Contact information: [address] [place] [address] [phone]  Earnstine Regal, MD, [facility] Office: [phone]   Signed: Earnstine Regal 04/02/2018, 9:06 AM

## 2018-04-02 NOTE — Progress Notes (Signed)
Pt was discharged home today. Instructions were reviewed with patient, and questions were answered. Pt was taken to main entrance via wheelchair by NT.  

## 2018-04-16 DIAGNOSIS — D373 Neoplasm of uncertain behavior of appendix: Secondary | ICD-10-CM | POA: Diagnosis not present

## 2018-04-18 DIAGNOSIS — D373 Neoplasm of uncertain behavior of appendix: Secondary | ICD-10-CM | POA: Diagnosis not present

## 2018-04-22 DIAGNOSIS — H25811 Combined forms of age-related cataract, right eye: Secondary | ICD-10-CM | POA: Diagnosis not present

## 2018-04-22 DIAGNOSIS — H2511 Age-related nuclear cataract, right eye: Secondary | ICD-10-CM | POA: Diagnosis not present

## 2018-05-15 ENCOUNTER — Other Ambulatory Visit: Payer: Self-pay | Admitting: Cardiovascular Disease

## 2018-05-16 NOTE — Telephone Encounter (Signed)
Rx has been sent to the pharmacy electronically. ° °

## 2018-05-28 ENCOUNTER — Other Ambulatory Visit: Payer: Self-pay | Admitting: Cardiovascular Disease

## 2018-07-24 DIAGNOSIS — L821 Other seborrheic keratosis: Secondary | ICD-10-CM | POA: Diagnosis not present

## 2018-07-24 DIAGNOSIS — L570 Actinic keratosis: Secondary | ICD-10-CM | POA: Diagnosis not present

## 2018-07-24 DIAGNOSIS — D1801 Hemangioma of skin and subcutaneous tissue: Secondary | ICD-10-CM | POA: Diagnosis not present

## 2018-07-26 ENCOUNTER — Other Ambulatory Visit: Payer: Self-pay | Admitting: Podiatry

## 2018-07-31 DIAGNOSIS — R102 Pelvic and perineal pain: Secondary | ICD-10-CM | POA: Diagnosis not present

## 2018-08-04 ENCOUNTER — Other Ambulatory Visit: Payer: Self-pay | Admitting: Physician Assistant

## 2018-08-12 DIAGNOSIS — H25812 Combined forms of age-related cataract, left eye: Secondary | ICD-10-CM | POA: Diagnosis not present

## 2018-08-12 DIAGNOSIS — H2512 Age-related nuclear cataract, left eye: Secondary | ICD-10-CM | POA: Diagnosis not present

## 2018-08-21 DIAGNOSIS — G4733 Obstructive sleep apnea (adult) (pediatric): Secondary | ICD-10-CM | POA: Diagnosis not present

## 2018-09-18 ENCOUNTER — Other Ambulatory Visit: Payer: Self-pay | Admitting: Cardiovascular Disease

## 2018-09-18 NOTE — Telephone Encounter (Signed)
Pt calling requesting a refill on famotidine. Pt would like a call back at [phone]. Please address

## 2018-09-19 ENCOUNTER — Other Ambulatory Visit: Payer: Self-pay

## 2018-09-19 MED ORDER — FAMOTIDINE 20 MG PO TABS: 20.0000 mg | ORAL_TABLET | Freq: Two times a day (BID) | ORAL | 6 refills | Status: DC

## 2018-09-22 ENCOUNTER — Other Ambulatory Visit: Payer: Self-pay

## 2018-09-22 MED ORDER — FAMOTIDINE 20 MG PO TABS: 20.0000 mg | ORAL_TABLET | Freq: Two times a day (BID) | ORAL | 6 refills | Status: DC

## 2018-09-22 NOTE — Telephone Encounter (Signed)
Refill for famotidine sent by Caprice Beaver, LPN on 06/16/4008 as requested. Left message on patient's home phone per DPR informing him that refill has been sent in. Advised patient to contact our office with any further questions or concerns.

## 2018-10-01 DIAGNOSIS — K621 Rectal polyp: Secondary | ICD-10-CM | POA: Diagnosis not present

## 2018-10-01 DIAGNOSIS — D123 Benign neoplasm of transverse colon: Secondary | ICD-10-CM | POA: Diagnosis not present

## 2018-10-01 DIAGNOSIS — Z86010 Personal history of colonic polyps: Secondary | ICD-10-CM | POA: Diagnosis not present

## 2018-10-01 DIAGNOSIS — K5730 Diverticulosis of large intestine without perforation or abscess without bleeding: Secondary | ICD-10-CM | POA: Diagnosis not present

## 2018-10-03 DIAGNOSIS — D123 Benign neoplasm of transverse colon: Secondary | ICD-10-CM | POA: Diagnosis not present

## 2018-10-03 DIAGNOSIS — K621 Rectal polyp: Secondary | ICD-10-CM | POA: Diagnosis not present

## 2018-11-19 DIAGNOSIS — H838X3 Other specified diseases of inner ear, bilateral: Secondary | ICD-10-CM | POA: Diagnosis not present

## 2018-11-19 DIAGNOSIS — H6122 Impacted cerumen, left ear: Secondary | ICD-10-CM | POA: Diagnosis not present

## 2018-11-19 DIAGNOSIS — H903 Sensorineural hearing loss, bilateral: Secondary | ICD-10-CM | POA: Diagnosis not present

## 2018-11-21 ENCOUNTER — Telehealth: Payer: Self-pay | Admitting: Cardiovascular Disease

## 2018-11-21 NOTE — Telephone Encounter (Signed)
Justin Carr needs a return office visit with me the week after next for evaluation of his left arm pain.

## 2018-11-21 NOTE — Telephone Encounter (Signed)
New Message   Patient states that periodically he has experienced pain in his left arm. He said last time he felt that way he ended up having a stent. Patient is very concern.

## 2018-11-21 NOTE — Telephone Encounter (Signed)
Patient states he has had coming and going left arm pain for no more than 2 weeks.  He states that last time he had this arm pain they ended up doing a stent, and now patient is just concerned. Patient states the pain is 4 out of 10, but he does not want to wait until it worsened, and he would like to know what to do as he does not want to go to ER.  Patient denies chest pain/neck pain, does noticed SOB with slight activities, denies swelling.  Patient states yesterday he went to hit some golf balls and noticed that he could feel his heart beating out of his chest, and his head pounding.  BP today was 156/70 HR 66.

## 2018-11-24 ENCOUNTER — Other Ambulatory Visit: Payer: Self-pay | Admitting: Otolaryngology

## 2018-11-24 DIAGNOSIS — H918X9 Other specified hearing loss, unspecified ear: Secondary | ICD-10-CM

## 2018-12-17 ENCOUNTER — Ambulatory Visit: Payer: Medicare Other | Admitting: Cardiovascular Disease

## 2018-12-18 ENCOUNTER — Ambulatory Visit
Admission: RE | Admit: 2018-12-18 | Discharge: 2018-12-18 | Disposition: A | Discharge status: Home / Self Care | Payer: Medicare Other | Source: Ambulatory Visit | Attending: Otolaryngology | Admitting: Otolaryngology

## 2018-12-18 DIAGNOSIS — H9190 Unspecified hearing loss, unspecified ear: Secondary | ICD-10-CM | POA: Diagnosis not present

## 2018-12-18 DIAGNOSIS — H918X9 Other specified hearing loss, unspecified ear: Secondary | ICD-10-CM

## 2018-12-18 MED ORDER — GADOBENATE DIMEGLUMINE 529 MG/ML IV SOLN
20.0000 mL | Freq: Once | INTRAVENOUS | Status: AC | PRN
  Administered 2018-12-18: 20 mL via INTRAVENOUS

## 2018-12-23 DIAGNOSIS — Z23 Encounter for immunization: Secondary | ICD-10-CM | POA: Diagnosis not present

## 2019-01-14 DIAGNOSIS — I2510 Atherosclerotic heart disease of native coronary artery without angina pectoris: Secondary | ICD-10-CM | POA: Diagnosis not present

## 2019-01-14 DIAGNOSIS — E7800 Pure hypercholesterolemia, unspecified: Secondary | ICD-10-CM | POA: Diagnosis not present

## 2019-01-14 DIAGNOSIS — E1165 Type 2 diabetes mellitus with hyperglycemia: Secondary | ICD-10-CM | POA: Diagnosis not present

## 2019-01-14 DIAGNOSIS — I10 Essential (primary) hypertension: Secondary | ICD-10-CM | POA: Diagnosis not present

## 2019-01-14 DIAGNOSIS — E1140 Type 2 diabetes mellitus with diabetic neuropathy, unspecified: Secondary | ICD-10-CM | POA: Diagnosis not present

## 2019-01-21 ENCOUNTER — Other Ambulatory Visit: Payer: Self-pay | Admitting: Cardiovascular Disease

## 2019-01-21 DIAGNOSIS — M5412 Radiculopathy, cervical region: Secondary | ICD-10-CM | POA: Diagnosis not present

## 2019-01-27 DIAGNOSIS — R3912 Poor urinary stream: Secondary | ICD-10-CM | POA: Diagnosis not present

## 2019-01-27 DIAGNOSIS — R350 Frequency of micturition: Secondary | ICD-10-CM | POA: Diagnosis not present

## 2019-02-04 DIAGNOSIS — M5412 Radiculopathy, cervical region: Secondary | ICD-10-CM | POA: Diagnosis not present

## 2019-02-05 ENCOUNTER — Other Ambulatory Visit: Payer: Self-pay | Admitting: Cardiovascular Disease

## 2019-02-08 ENCOUNTER — Other Ambulatory Visit: Payer: Self-pay | Admitting: Physician Assistant

## 2019-02-09 ENCOUNTER — Other Ambulatory Visit: Payer: Self-pay

## 2019-02-09 NOTE — Telephone Encounter (Signed)
Made in error

## 2019-02-10 NOTE — Telephone Encounter (Signed)
This is Dr. Berry's pt 

## 2019-02-17 DIAGNOSIS — M542 Cervicalgia: Secondary | ICD-10-CM | POA: Diagnosis not present

## 2019-02-24 ENCOUNTER — Other Ambulatory Visit: Payer: Self-pay

## 2019-02-24 ENCOUNTER — Ambulatory Visit (INDEPENDENT_AMBULATORY_CARE_PROVIDER_SITE_OTHER): Payer: Medicare Other | Admitting: Cardiovascular Disease

## 2019-02-24 ENCOUNTER — Encounter: Payer: Self-pay | Admitting: Cardiovascular Disease

## 2019-02-24 VITALS — BP 118/50 | HR 60 | Temp 96.8°F | Ht 71.0 in | Wt 206.0 lb

## 2019-02-24 DIAGNOSIS — E782 Mixed hyperlipidemia: Secondary | ICD-10-CM | POA: Diagnosis not present

## 2019-02-24 DIAGNOSIS — I701 Atherosclerosis of renal artery: Secondary | ICD-10-CM | POA: Diagnosis not present

## 2019-02-24 DIAGNOSIS — I2510 Atherosclerotic heart disease of native coronary artery without angina pectoris: Secondary | ICD-10-CM | POA: Diagnosis not present

## 2019-02-24 DIAGNOSIS — I10 Essential (primary) hypertension: Secondary | ICD-10-CM | POA: Diagnosis not present

## 2019-02-24 DIAGNOSIS — R072 Precordial pain: Secondary | ICD-10-CM

## 2019-02-24 DIAGNOSIS — I2583 Coronary atherosclerosis due to lipid rich plaque: Secondary | ICD-10-CM

## 2019-02-24 DIAGNOSIS — R079 Chest pain, unspecified: Secondary | ICD-10-CM | POA: Diagnosis not present

## 2019-02-24 NOTE — Patient Instructions (Signed)
Medication Instructions:  Your physician recommends that you continue on your current medications as directed. Please refer to the Current Medication list given to you today.  If you need a refill on your cardiac medications before your next appointment, please call your pharmacy.   Lab work: NONE  Testing/Procedures: NONE  Follow-Up: At [facility], you and your health needs are our priority.  As part of our continuing mission to provide you with exceptional heart care, we have created designated Provider Care Teams.  These Care Teams include your primary Cardiologist (physician) and Advanced Practice Providers (APPs -  Physician Assistants and Nurse Practitioners) who all work together to provide you with the care you need, when you need it. You may see Jonathan Berry, MD or one of the following Advanced Practice Providers on your designated Care Team:    Luke Kilroy, PA-C  Callie Goodrich, PA-C  Jesse Cleaver, FNP Your physician wants you to follow-up in 1 year        

## 2019-02-24 NOTE — Progress Notes (Signed)
02/24/2019 Justin Carr   1945/12/30  [id number]  Primary Physician Shirline Frees, MD Primary Cardiologist: [name] MD FACP, [place], [place], [place]  HPI:  Justin Carr is a 73 y.o.  mildly overweight married Caucasian male, father of 2 children (1 living), who I I last saw in the office 03/11/2018. 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 His problems include obstructive sleep apnea, on CPAP, hypertension, and hyperlipidemia. He denies chest pain or shortness of breath. He had a non-ST-segment-elevation myocardial infarction, July 02, 2007. He underwent PCI and stenting of his proximal LAD with a Taxus Liberte drug-eluting stent. He had also had a 60% right renal artery stenosis documented angiographically at that time, which we have been following by duplex ultrasound. Since I saw him back a year ago he denies chest pain or shortness of breathbut has noticed some left upper extremity discomfort with exertion similar to his pre-MI symptoms..Recent lipid profile performed by his PCP3/29/18 revealing a total cholesterol 113, LDL 48 and HDL of 35. he underwent Myoview stress testing 04/04/17 that showed ischemia in the LAD territory. I performed radial diagnostic cath on him 04/11/17 revealed a 99% "in-stent restenosis" within the proximal LAD stent. Remainder of his coronary anatomy was free of significant disease in his LV function was normal. I restarted him with a 3 mm x 20 mm long synergy drug-eluting stent postdilated to 3.23 mm resulting reduction a 9% stenosis to 0% residual. He has done well since.  Since I saw him in the office 03/11/2018 he has done well.  He did have 1 fairly limited episode of left upper extremity pain which resolved spontaneously.  He denies chest pain or shortness of breath.  He remains on dual antiplatelet therapy.   Current Meds  Medication Sig  .  aspirin 81 MG tablet Take 1 tablet (81 mg total) by mouth daily. (Patient taking differently: Take 81 mg by mouth at bedtime. )  . clopidogrel (PLAVIX) 75 MG tablet Take 1 tablet by mouth once daily  . Coenzyme Q10 (COQ-10 PO) Take 1 capsule by mouth at bedtime.   . dapagliflozin propanediol (FARXIGA) 5 MG TABS tablet Take 5 mg by mouth daily.  . famotidine (PEPCID) 20 MG tablet Take 1 tablet (20 mg total) by mouth 2 (two) times daily.  Marland Kitchen gabapentin (NEURONTIN) 300 MG capsule TAKE 1 CAPSULE BY MOUTH THREE TIMES A DAY  . metFORMIN (GLUCOPHAGE-XR) 500 MG 24 hr tablet Take 1,000 mg by mouth 2 (two) times daily.  . Multiple Vitamin (MULTIVITAMIN WITH MINERALS) TABS tablet Take 1 tablet by mouth daily.  . Multiple Vitamins-Minerals (PRESERVISION AREDS PO) Take 1 capsule by mouth 2 (two) times daily.   . nitroGLYCERIN (NITROSTAT) 0.4 MG SL tablet DISSOLVE ONE TABLET UNDER THE TONGUE EVERY 5 MINUTES AS NEEDED FOR CHEST PAIN.  DO NOT EXCEED A TOTAL OF 3 DOSES IN 15 MINUTES  . ramipril (ALTACE) 10 MG capsule Take 1 capsule (10 mg total) by mouth daily. NEED OV.  . rosuvastatin (CRESTOR) 20 MG tablet TAKE 1 TABLET BY MOUTH THREE TIMES A WEEK  . sildenafil (REVATIO) 20 MG tablet Take 20-100 mg by mouth as needed (for erectile disfunction).   . traMADol (ULTRAM) 50 MG tablet Take 1-2 tablets (50-100 mg total) by mouth every 6 (six) hours as needed for moderate pain.     Allergies  Allergen Reactions  .  Peanuts [Peanut Oil] Anaphylaxis  . Lipitor [Atorvastatin] Other (See Comments)    Muscle cramps  . Rosuvastatin Other (See Comments)    Muscle cramps if taken daily-pt still takes drug, but takes 3 times a week.    Social History   Socioeconomic History  . Marital status: Married    Spouse name: Not on file  . Number of children: 2  . Years of education: College +  . Highest education level: Not on file  Occupational History  . Occupation: Retired  Scientific laboratory technician  . Financial resource strain:  Not hard at all  . Food insecurity    Worry: Never true    Inability: Never true  . Transportation needs    Medical: No    Non-medical: No  Tobacco Use  . Smoking status: Former Smoker    Packs/day: 1.00    Years: 30.00    Pack years: 30.00    Types: Cigarettes    Quit date: 07/03/2006    Years since quitting: 12.6  . Smokeless tobacco: Never Used  Substance and Sexual Activity  . Alcohol use: Yes    Comment: 04/11/2017 "might have a beer q other week"  . Drug use: No  . Sexual activity: Not Currently  Lifestyle  . Physical activity    Days per week: 3 days    Minutes per session: 30 min  . Stress: Not at all  Relationships  . Social Herbalist on phone: Not on file    Gets together: Not on file    Attends religious service: Not on file    Active member of club or organization: Not on file    Attends meetings of clubs or organizations: Not on file    Relationship status: Not on file  . Intimate partner violence    Fear of current or ex partner: Not on file    Emotionally abused: Not on file    Physically abused: Not on file    Forced sexual activity: Not on file  Other Topics Concern  . Not on file  Social History Narrative   1 cup coffee and 2 sodas per day.   Lives at home with his wife.   Right-handed.     Review of Systems: General: negative for chills, fever, night sweats or weight changes.  Cardiovascular: negative for chest pain, dyspnea on exertion, edema, orthopnea, palpitations, paroxysmal nocturnal dyspnea or shortness of breath Dermatological: negative for rash Respiratory: negative for cough or wheezing Urologic: negative for hematuria Abdominal: negative for nausea, vomiting, diarrhea, bright red blood per rectum, melena, or hematemesis Neurologic: negative for visual changes, syncope, or dizziness All other systems reviewed and are otherwise negative except as noted above.    Blood pressure (!) 118/50, pulse 60, temperature (!) 96.8 F  (36 C), height 5\' 11"  (1.803 m), weight 206 lb (93.4 kg).  General appearance: alert and no distress Neck: no adenopathy, no carotid bruit, no JVD, supple, symmetrical, trachea midline and thyroid not enlarged, symmetric, no tenderness/mass/nodules Lungs: clear to auscultation bilaterally Heart: regular rate and rhythm, S1, S2 normal, no murmur, click, rub or gallop Extremities: extremities normal, atraumatic, no cyanosis or edema Pulses: 2+ and symmetric Skin: Skin color, texture, turgor normal. No rashes or lesions Neurologic: Alert and oriented X 3, normal strength and tone. Normal symmetric reflexes. Normal coordination and gait  EKG sinus rhythm at 60 without ST or T wave changes.  I personally reviewed this EKG.  ASSESSMENT AND PLAN:  Coronary artery disease History of CAD status post non-ST segment elevation myocardial infarction July 02, 2007.  With approximately PCI and stenting using a Taxus liver today drug-eluting stent.  He also had a 60% right renal artery stenosis at the time document angiographically.  Because of left upper extremity discomfort similar to his pre-MI symptoms he had a Myoview stress test performed 04/04/2017 that showed ischemia in the LAD territory.  I performed diagnostic radial cath on him 04/11/2017 revealing a 99% "in-stent restenosis" within the proximal LAD stent.  The remainder of his anatomy was free of significant disease and his LV function was normal.  I restented him with a 3 mm x 20 mm long Synergy drug-eluting stent postdilated to 3.23 mm with a 0% residual.  He did have a transient left upper extremity pain back in August which resolved spontaneously but no chest pain  Essential hypertension History of essential hypertension with blood pressure measured today at 118/50.  He is on ramipril.  Hyperlipidemia History of hyperlipidemia on statin therapy with lipid profile performed 03/25/2018 revealing total cholesterol of 155, LDL 65 and HDL 42.   Renal artery stenosis History of 60% right renal artery stenosis demonstrated by angiography 07/02/2007 which has been followed by duplex ultrasound.  He underwent ultrasound in 2018 revealing no evidence of renal artery stenosis.      [name] MD FACP,FACC,FAHA, [facility] 02/24/2019 9:13 AM

## 2019-02-24 NOTE — Assessment & Plan Note (Signed)
History of essential hypertension with blood pressure measured today at 118/50.  He is on ramipril.

## 2019-02-24 NOTE — Assessment & Plan Note (Signed)
History of 60% right renal artery stenosis demonstrated by angiography 07/02/2007 which has been followed by duplex ultrasound.  He underwent ultrasound in 2018 revealing no evidence of renal artery stenosis.

## 2019-02-24 NOTE — Assessment & Plan Note (Signed)
History of CAD status post non-ST segment elevation myocardial infarction July 02, 2007.  With approximately PCI and stenting using a Taxus liver today drug-eluting stent.  He also had a 60% right renal artery stenosis at the time document angiographically.  Because of left upper extremity discomfort similar to his pre-MI symptoms he had a Myoview stress test performed 04/04/2017 that showed ischemia in the LAD territory.  I performed diagnostic radial cath on him 04/11/2017 revealing a 99% "in-stent restenosis" within the proximal LAD stent.  The remainder of his anatomy was free of significant disease and his LV function was normal.  I restented him with a 3 mm x 20 mm long Synergy drug-eluting stent postdilated to 3.23 mm with a 0% residual.  He did have a transient left upper extremity pain back in August which resolved spontaneously but no chest pain

## 2019-02-24 NOTE — Assessment & Plan Note (Signed)
History of hyperlipidemia on statin therapy with lipid profile performed 03/25/2018 revealing total cholesterol of 155, LDL 65 and HDL 42.

## 2019-02-26 DIAGNOSIS — E782 Mixed hyperlipidemia: Secondary | ICD-10-CM | POA: Diagnosis not present

## 2019-02-26 DIAGNOSIS — Z0000 Encounter for general adult medical examination without abnormal findings: Secondary | ICD-10-CM | POA: Diagnosis not present

## 2019-02-26 DIAGNOSIS — N5203 Combined arterial insufficiency and corporo-venous occlusive erectile dysfunction: Secondary | ICD-10-CM | POA: Diagnosis not present

## 2019-02-26 DIAGNOSIS — I10 Essential (primary) hypertension: Secondary | ICD-10-CM | POA: Diagnosis not present

## 2019-02-26 DIAGNOSIS — G4733 Obstructive sleep apnea (adult) (pediatric): Secondary | ICD-10-CM | POA: Diagnosis not present

## 2019-02-26 DIAGNOSIS — E1169 Type 2 diabetes mellitus with other specified complication: Secondary | ICD-10-CM | POA: Diagnosis not present

## 2019-02-26 DIAGNOSIS — Z7984 Long term (current) use of oral hypoglycemic drugs: Secondary | ICD-10-CM | POA: Diagnosis not present

## 2019-04-17 ENCOUNTER — Other Ambulatory Visit: Payer: Self-pay | Admitting: Cardiovascular Disease

## 2019-05-11 ENCOUNTER — Other Ambulatory Visit: Payer: Self-pay | Admitting: Physician Assistant

## 2019-05-24 ENCOUNTER — Ambulatory Visit: Payer: Federal, State, Local not specified - PPO | Attending: Internal Medicine

## 2019-05-24 DIAGNOSIS — Z23 Encounter for immunization: Secondary | ICD-10-CM | POA: Insufficient documentation

## 2019-05-24 NOTE — Progress Notes (Signed)
   Covid-19 Vaccination Clinic  Name:  Justin Carr    MRN: [id number] DOB: [birth year]  05/24/2019  [name] was observed post Covid-19 immunization for 15 minutes without incidence. He was provided with Vaccine Information Sheet and instruction to access the V-Safe system.   [name] was instructed to call 911 with any severe reactions post vaccine: Marland Kitchen Difficulty breathing  . Swelling of your face and throat  . A fast heartbeat  . A bad rash all over your body  . Dizziness and weakness    Immunizations Administered    Name Date Dose VIS Date Route   Pfizer COVID-19 Vaccine 05/24/2019  9:51 AM 0.3 mL 03/06/2019 Intramuscular   Manufacturer: [place]   Lot: [id number]   [place]: [id number]

## 2019-06-01 ENCOUNTER — Other Ambulatory Visit: Payer: Self-pay

## 2019-06-01 ENCOUNTER — Ambulatory Visit (INDEPENDENT_AMBULATORY_CARE_PROVIDER_SITE_OTHER): Payer: Medicare Other | Admitting: Podiatry

## 2019-06-01 ENCOUNTER — Encounter: Payer: Self-pay | Admitting: Podiatry

## 2019-06-01 VITALS — Temp 98.0°F

## 2019-06-01 DIAGNOSIS — G629 Polyneuropathy, unspecified: Secondary | ICD-10-CM | POA: Diagnosis not present

## 2019-06-01 DIAGNOSIS — L600 Ingrowing nail: Secondary | ICD-10-CM | POA: Diagnosis not present

## 2019-06-01 MED ORDER — NEOMYCIN-POLYMYXIN-HC 3.5-10000-1 OT SOLN: OTIC | 0 refills | Status: DC

## 2019-06-01 NOTE — Patient Instructions (Signed)

## 2019-06-03 NOTE — Progress Notes (Signed)
Subjective:   Patient ID: Justin Carr, male   DOB: 74 y.o.   MRN: [id number]   HPI Patient states this right hallux nail has been thick and dystrophic and painful and I had the left one removed several years ago which did well.  I also have a lot of numbness in both my feet and I have numerous questions concerning this and I have started Lyrica     ROS      Objective:  Physical Exam  Neurovascular status was found to be intact with digital perfusion good and hair growth adequate for his age.  Patient does have mild diminishment of sharp dull vibratory bilateral and while he has not had falls he feels slightly unsteady and is concerned about the intensification of neuropathic symptoms.  He has severely thickened dystrophic hallux nail right that he cannot cut and increasingly is bothersome for him with history of nail removal prominent left     Assessment:  Neuropathy present bilateral with thickened mycotic nail bed right that is becoming more symptomatic for him     Plan:  H&P reviewed both conditions and I have recommended nail removal and explained procedure to patient.  Patient wants surgery and understands risks and signs consent form and today I infiltrated the right hallux 60 mg like Marcaine mixture sterile prep applied and using sterile instrumentation removed hallux nail exposed the matrix and applied phenol 5 applications 30 seconds followed by alcohol lavage and sterile dressing.  Gave instructions on soaks and patient will be seen back for [place] to recheck in the next several weeks and I went ahead also today and I for the neuropathy recommended that he do do the Lyrica but I explained this will not cure this and if he starts to develop balance issues I want to work with physical therapy to try to help him

## 2019-06-17 ENCOUNTER — Ambulatory Visit: Payer: Federal, State, Local not specified - PPO | Attending: Internal Medicine

## 2019-06-17 DIAGNOSIS — Z23 Encounter for immunization: Secondary | ICD-10-CM

## 2019-06-17 NOTE — Progress Notes (Signed)
   Covid-19 Vaccination Clinic  Name:  Justin Carr    MRN: [id number] DOB: [birth year]  06/17/2019  Mr. Justin Carr was observed post Covid-19 immunization for 15 minutes without incident. He was provided with Vaccine Information Sheet and instruction to access the V-Safe system.   Mr. Justin Carr was instructed to call 911 with any severe reactions post vaccine: Marland Kitchen Difficulty breathing  . Swelling of face and throat  . A fast heartbeat  . A bad rash all over body  . Dizziness and weakness   Immunizations Administered    Name Date Dose VIS Date Route   Pfizer COVID-19 Vaccine 06/17/2019 12:12 PM 0.3 mL 03/06/2019 Intramuscular   Manufacturer: [place]   Lot: [id number]   [place]: [id number]

## 2019-07-07 ENCOUNTER — Other Ambulatory Visit: Payer: Self-pay | Admitting: Cardiovascular Disease

## 2019-07-15 ENCOUNTER — Other Ambulatory Visit: Payer: Self-pay | Admitting: Student

## 2019-07-15 DIAGNOSIS — R1031 Right lower quadrant pain: Secondary | ICD-10-CM

## 2019-07-16 ENCOUNTER — Ambulatory Visit
Admission: RE | Admit: 2019-07-16 | Discharge: 2019-07-16 | Disposition: A | Discharge status: Home / Self Care | Payer: Federal, State, Local not specified - PPO | Source: Ambulatory Visit | Attending: Student | Admitting: Student

## 2019-07-16 ENCOUNTER — Other Ambulatory Visit: Payer: Self-pay

## 2019-07-16 DIAGNOSIS — R1031 Right lower quadrant pain: Secondary | ICD-10-CM

## 2019-09-04 ENCOUNTER — Encounter (HOSPITAL_COMMUNITY): Payer: Self-pay | Admitting: Emergency Medicine

## 2019-09-04 ENCOUNTER — Other Ambulatory Visit: Payer: Self-pay

## 2019-09-04 ENCOUNTER — Emergency Department (HOSPITAL_COMMUNITY)
Admission: EM | Admit: 2019-09-04 | Discharge: 2019-09-05 | Disposition: A | Discharge status: Home / Self Care | Payer: Medicare Other | Attending: Emergency Medicine | Admitting: Emergency Medicine

## 2019-09-04 DIAGNOSIS — Z87891 Personal history of nicotine dependence: Secondary | ICD-10-CM | POA: Diagnosis not present

## 2019-09-04 DIAGNOSIS — Z8509 Personal history of malignant neoplasm of other digestive organs: Secondary | ICD-10-CM | POA: Diagnosis not present

## 2019-09-04 DIAGNOSIS — W19XXXA Unspecified fall, initial encounter: Secondary | ICD-10-CM | POA: Insufficient documentation

## 2019-09-04 DIAGNOSIS — I2510 Atherosclerotic heart disease of native coronary artery without angina pectoris: Secondary | ICD-10-CM | POA: Insufficient documentation

## 2019-09-04 DIAGNOSIS — R42 Dizziness and giddiness: Secondary | ICD-10-CM | POA: Diagnosis not present

## 2019-09-04 DIAGNOSIS — Z79899 Other long term (current) drug therapy: Secondary | ICD-10-CM | POA: Diagnosis not present

## 2019-09-04 DIAGNOSIS — I10 Essential (primary) hypertension: Secondary | ICD-10-CM | POA: Insufficient documentation

## 2019-09-04 DIAGNOSIS — Y9301 Activity, walking, marching and hiking: Secondary | ICD-10-CM | POA: Insufficient documentation

## 2019-09-04 DIAGNOSIS — Z7984 Long term (current) use of oral hypoglycemic drugs: Secondary | ICD-10-CM | POA: Insufficient documentation

## 2019-09-04 DIAGNOSIS — Y999 Unspecified external cause status: Secondary | ICD-10-CM | POA: Diagnosis not present

## 2019-09-04 DIAGNOSIS — Y929 Unspecified place or not applicable: Secondary | ICD-10-CM | POA: Insufficient documentation

## 2019-09-04 DIAGNOSIS — Z7902 Long term (current) use of antithrombotics/antiplatelets: Secondary | ICD-10-CM | POA: Insufficient documentation

## 2019-09-04 DIAGNOSIS — Z91010 Allergy to peanuts: Secondary | ICD-10-CM | POA: Diagnosis not present

## 2019-09-04 DIAGNOSIS — E119 Type 2 diabetes mellitus without complications: Secondary | ICD-10-CM | POA: Insufficient documentation

## 2019-09-04 DIAGNOSIS — Z7982 Long term (current) use of aspirin: Secondary | ICD-10-CM | POA: Insufficient documentation

## 2019-09-04 DIAGNOSIS — Z955 Presence of coronary angioplasty implant and graft: Secondary | ICD-10-CM | POA: Diagnosis not present

## 2019-09-04 DIAGNOSIS — H9202 Otalgia, left ear: Secondary | ICD-10-CM | POA: Diagnosis not present

## 2019-09-04 LAB — COMPREHENSIVE METABOLIC PANEL
ALT: 27 U/L (ref 0–44)
AST: 23 U/L (ref 15–41)
Albumin: 4.2 g/dL (ref 3.5–5.0)
Alkaline Phosphatase: 48 U/L (ref 38–126)
Anion gap: 5 (ref 5–15)
BUN: 18 mg/dL (ref 8–23)
CO2: 27 mmol/L (ref 22–32)
Calcium: 9.1 mg/dL (ref 8.9–10.3)
Chloride: 108 mmol/L (ref 98–111)
Creatinine, Ser: 1.2 mg/dL (ref 0.61–1.24)
GFR calc Af Amer: >60 mL/min (ref >60)
GFR calc non Af Amer: 60 mL/min — ABNORMAL LOW (ref >60)
Glucose, Bld: 167 mg/dL — ABNORMAL HIGH (ref 70–99)
Potassium: 4.8 mmol/L (ref 3.5–5.1)
Sodium: 140 mmol/L (ref 135–145)
Total Bilirubin: 0.5 mg/dL (ref 0.3–1.2)
Total Protein: 6.6 g/dL (ref 6.5–8.1)

## 2019-09-04 LAB — CBC WITH DIFFERENTIAL/PLATELET
Abs Immature Granulocytes: 0.02 10*3/uL (ref 0.00–0.07)
Basophils Absolute: 0.1 10*3/uL (ref 0.0–0.1)
Basophils Relative: 1 %
Eosinophils Absolute: 0.1 10*3/uL (ref 0.0–0.5)
Eosinophils Relative: 2 %
HCT: 47.8 % (ref 39.0–52.0)
Hemoglobin: 16.1 g/dL (ref 13.0–17.0)
Immature Granulocytes: 0 %
Lymphocytes Relative: 40 %
Lymphs Abs: 2.9 10*3/uL (ref 0.7–4.0)
MCH: 32.7 pg (ref 26.0–34.0)
MCHC: 33.7 g/dL (ref 30.0–36.0)
MCV: 97 fL (ref 80.0–100.0)
Monocytes Absolute: 0.7 10*3/uL (ref 0.1–1.0)
Monocytes Relative: 9 %
Neutro Abs: 3.5 10*3/uL (ref 1.7–7.7)
Neutrophils Relative %: 48 %
Platelets: 180 10*3/uL (ref 150–400)
RBC: 4.93 MIL/uL (ref 4.22–5.81)
RDW: 12.7 % (ref 11.5–15.5)
WBC: 7.3 10*3/uL (ref 4.0–10.5)
nRBC: 0 % (ref 0.0–0.2)

## 2019-09-04 LAB — CBG MONITORING, ED: Glucose-Capillary: 184 mg/dL — ABNORMAL HIGH (ref 70–99)

## 2019-09-04 NOTE — ED Triage Notes (Addendum)
Patient lost his balance and fell twice this afternoon , denies injury/ambulatory , alert and oriented x4 at triage , speech clear/no facial asymmetry , no arm drift/equal strong grips . Patient added intermittent left ear ache for several days . Denies chest pain or SOB . CBG=184.

## 2019-09-05 ENCOUNTER — Emergency Department (HOSPITAL_COMMUNITY): Payer: Medicare Other

## 2019-09-05 DIAGNOSIS — R42 Dizziness and giddiness: Secondary | ICD-10-CM | POA: Diagnosis not present

## 2019-09-05 LAB — URINALYSIS, ROUTINE W REFLEX MICROSCOPIC
Bacteria, UA: NONE SEEN
Bilirubin Urine: NEGATIVE
Glucose, UA: >=500 mg/dL — AB
Hgb urine dipstick: NEGATIVE
Ketones, ur: NEGATIVE mg/dL
Leukocytes,Ua: NEGATIVE
Nitrite: NEGATIVE
Protein, ur: NEGATIVE mg/dL
Specific Gravity, Urine: 1.02 (ref 1.005–1.030)
pH: 6 (ref 5.0–8.0)

## 2019-09-05 MED ORDER — GADOBUTROL 1 MMOL/ML IV SOLN
10.0000 mL | Freq: Once | INTRAVENOUS | Status: AC | PRN
  Administered 2019-09-05: 10 mL via INTRAVENOUS

## 2019-09-05 NOTE — ED Notes (Signed)
Pt. Transported to MRI 

## 2019-09-05 NOTE — ED Provider Notes (Addendum)
[facility] EMERGENCY DEPARTMENT Provider Note   CSN: [id number] Arrival date & time: 09/04/19  1923     History Chief Complaint  Patient presents with  . Fall    Justin Carr is a 74 y.o. male.  Patient presents to the emergency department for evaluation of dizziness with fall.  Patient reports that he was walking to his car when he suddenly felt dizzy.  He felt like he was falling to the left.  He reports that he could not stop falling to the left, fell over into the grass.  No injury from the fall.  He was ultimately able to get up, get into his car and drive home.  He is feeling better now.  No headache, vision change.  He has noticed for several months of intermittent pain behind his left ear but no ringing in his ears or hearing loss.  Patient denies any concomitant speech difficulty, unilateral numbness, tingling or weakness.        Past Medical History:  Diagnosis Date  . Coronary artery disease   . GERD (gastroesophageal reflux disease)   . Hyperlipidemia   . Hypertension   . NSTEMI (non-ST elevated myocardial infarction) ([place]) 06/2007   [name] 07/27/2010  . OSA on CPAP   . Palpitations   . Pneumonia ~ 1971 X 1  . Type II diabetes mellitus [facility])     Patient Active Problem List   Diagnosis Date Noted  . 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 Right inguinal hernia 01/05/2018  . CAD (coronary artery disease) 04/11/2017  . Stable angina (HCC)   . Abnormal nuclear stress test   . Diabetic neuropathy ([place]) 04/20/2015  . Paresthesia 03/23/2015  . Palpitations 03/24/2014  . Coronary artery disease 04/21/2013  . Essential hypertension 04/21/2013  . Hyperlipidemia 04/21/2013  . Diabetes ([place]) 04/21/2013  . Obstructive sleep apnea 04/21/2013  . Renal artery stenosis ([place]) 04/21/2013    Past Surgical History:  Procedure Laterality Date  . CARDIOVASCULAR STRESS TEST  10/06/2007   No scintigraphic evidence of inducible myocardial ischemia. ECG positive  for ischemia. Pharmacologically induced ST segment depression. Perfusion defect seen in inferior myocardial region consistent with diaphragmatic attenuation.  . CORONARY ANGIOPLASTY WITH STENT PLACEMENT  07/03/2007   LAD 90% ulcerated plaque proximal between 1st and 2nd branches stented with a 2.75x67mm Taxus Liberte stent deployed at 16atm. Postdilated at 16atm (3.36mm). resulting in reduction of 90% lesion to 0% residual with TIMI3 flow. Impingement on theostium of diag branch dilatedat 6atm resulting in reduction of 90% ostial D2 to <50% residual.  . CORONARY ANGIOPLASTY WITH STENT PLACEMENT  04/11/2017  . INGUINAL HERNIA REPAIR Right 01/06/2018   Procedure: OPEN RIGHT INGUINAL HERNIA REPAIR WITH MESH;  Surgeon: Armandina Gemma, MD;  Location: [place];  Service: General;  Laterality: Right;  . INSERTION OF MESH Right 01/06/2018   Procedure: INSERTION OF MESH;  Surgeon: Armandina Gemma, MD;  Location: [place];  Service: General;  Laterality: Right;  . LAPAROSCOPIC APPENDECTOMY N/A 04/01/2018   Procedure: APPENDECTOMY LAPAROSCOPIC;  Surgeon: Armandina Gemma, MD;  Location: [facility];  Service: General;  Laterality: N/A;  . LEFT HEART CATH AND CORONARY ANGIOGRAPHY N/A 04/11/2017   Procedure: LEFT HEART CATH AND CORONARY ANGIOGRAPHY;  Surgeon: [name], MD;  Location: [place] [facility];  Service: Cardiovascular;  Laterality: N/A;  . LIGAMENT REPAIR Right 03/2001   wrist  . RENAL ARTERIAL DOPPLER  5/64/3329   SMA and Cephalic artery >51% diameter reduction, Rt prox  Renal artery 60-99% diameter reduction, Lft prox Renal artery 1-59% diameter reduction, kidneys are normal in size.  Marland Kitchen SHOULDER ARTHROSCOPY WITH SUBACROMIAL DECOMPRESSION Right 04/03/2013   Procedure: RIGHT SHOULDER ARTHROSCOPY WITH SUBACROMIAL DECOMPRESSION/DISTAL CLAVICLE RESECTION;  Surgeon: Marin Shutter, MD;  Location: [place];  Service: Orthopedics;  Laterality: Right;  . TONSILLECTOMY AND ADENOIDECTOMY  ~ 1952  . TRANSTHORACIC ECHOCARDIOGRAM   10/06/2007   EF 63%, normal.  . WISDOM TOOTH EXTRACTION     age 18       Family History  Problem Relation Age of Onset  . Heart attack Maternal Grandfather   . Parkinson's disease Paternal Grandfather   . Heart disease Mother   . Arrhythmia Father   . Dementia Father     Social History   Tobacco Use  . Smoking status: Former Smoker    Packs/day: 1.00    Years: 30.00    Pack years: 30.00    Types: Cigarettes    Quit date: 07/03/2006    Years since quitting: 13.1  . Smokeless tobacco: Never Used  Vaping Use  . Vaping Use: Never used  Substance Use Topics  . Alcohol use: Yes    Comment: 04/11/2017 "might have a beer q other week"  . Drug use: No    Home Medications Prior to Admission medications   Medication Sig Start Date End Date Taking? Authorizing Provider  aspirin 81 MG tablet Take 1 tablet (81 mg total) by mouth daily. Patient taking differently: Take 81 mg by mouth at bedtime.  04/12/17  Yes Bhagat, Crista Luria, PA  clopidogrel (PLAVIX) 75 MG tablet Take 1 tablet by mouth once daily 02/09/19  Yes [name], MD  Coenzyme Q10 (COQ-10 PO) Take 1 capsule by mouth at bedtime.    Yes [provider]  dapagliflozin propanediol (FARXIGA) 5 MG TABS tablet Take 5 mg by mouth daily.   Yes [provider]  famotidine (PEPCID) 20 MG tablet Take 1 tablet (20 mg total) by mouth 2 (two) times daily. 09/22/18  Yes [name], MD  metFORMIN (GLUCOPHAGE) 500 MG tablet Take 1,000 mg by mouth 2 (two) times daily. 05/05/19  Yes [provider]  Multiple Vitamin (MULTIVITAMIN WITH MINERALS) TABS tablet Take 1 tablet by mouth daily.   Yes [provider]  Multiple Vitamins-Minerals (PRESERVISION AREDS PO) Take 1 capsule by mouth 2 (two) times daily.    Yes [provider]  nitroGLYCERIN (NITROSTAT) 0.4 MG SL tablet DISSOLVE ONE TABLET UNDER THE TONGUE EVERY 5 MINUTES AS NEEDED FOR CHEST PAIN.  DO NOT EXCEED A TOTAL OF 3 DOSES IN 15  MINUTES Patient taking differently: Place 0.4 mg under the tongue every 5 (five) minutes as needed for chest pain.  05/30/18  Yes [name], MD  pregabalin (LYRICA) 75 MG capsule Take 75 mg by mouth 2 (two) times daily.  05/12/19  Yes [provider]  ramipril (ALTACE) 10 MG capsule Take 1 capsule (10 mg total) by mouth daily. 05/12/19  Yes Barrett, Evelene Croon, PA-C  rosuvastatin (CRESTOR) 20 MG tablet TAKE 1 TABLET BY MOUTH THREE TIMES A WEEK Patient taking differently: Take 20 mg by mouth every Monday, Wednesday, and Friday.  07/07/19  Yes [name], MD  sildenafil (REVATIO) 20 MG tablet Take 20-100 mg by mouth as needed (for erectile disfunction).    Yes [provider]  neomycin-polymyxin-hydrocortisone (CORTISPORIN) OTIC solution Apply 1-2 drops to the toe after soaking toe twice a day Patient not taking: Reported  on 09/05/2019 06/01/19   Wallene Huh, DPM    Allergies    Peanuts [peanut oil], Lipitor [atorvastatin], and Rosuvastatin  Review of Systems   Review of Systems  Neurological: Positive for dizziness.  All other systems reviewed and are negative.   Physical Exam Updated Vital Signs BP (!) 165/65 (BP Location: Right Arm)   Pulse 62   Temp 98 F (36.7 C) (Oral)   Resp 12   Ht 5\' 10"  (1.778 m)   Wt 100 kg   SpO2 99%   BMI 31.63 kg/m   Physical Exam Vitals and nursing note reviewed.  Constitutional:      General: He is not in acute distress.    Appearance: Normal appearance. He is well-developed.  HENT:     Head: Normocephalic and atraumatic.     Right Ear: Hearing normal.     Left Ear: Hearing normal.     Nose: Nose normal.  Eyes:     Conjunctiva/sclera: Conjunctivae normal.     Pupils: Pupils are equal, round, and reactive to light.  Cardiovascular:     Rate and Rhythm: Regular rhythm.     Heart sounds: S1 normal and S2 normal. No murmur heard.  No friction rub. No gallop.   Pulmonary:     Effort: Pulmonary effort is normal. No  respiratory distress.     Breath sounds: Normal breath sounds.  Chest:     Chest wall: No tenderness.  Abdominal:     General: Bowel sounds are normal.     Palpations: Abdomen is soft.     Tenderness: There is no abdominal tenderness. There is no guarding or rebound. Negative signs include Murphy's sign and McBurney's sign.     Hernia: No hernia is present.  Musculoskeletal:        General: Normal range of motion.     Cervical back: Normal range of motion and neck supple.  Skin:    General: Skin is warm and dry.     Findings: No rash.  Neurological:     Mental Status: He is alert and oriented to person, place, and time.     GCS: GCS eye subscore is 4. GCS verbal subscore is 5. GCS motor subscore is 6.     Cranial Nerves: No cranial nerve deficit.     Sensory: No sensory deficit.     Coordination: Coordination normal.     Comments: Extraocular muscle movement: normal No visual field cut Pupils: equal and reactive both direct and consensual response is normal No nystagmus present    Sensory function is intact to light touch, pinprick Proprioception intact  Grip strength 5/5 symmetric in upper extremities No pronator drift Normal finger to nose bilaterally  Lower extremity strength 5/5 against gravity Normal heel to shin bilaterally  Gait: normal   Psychiatric:        Speech: Speech normal.        Behavior: Behavior normal.        Thought Content: Thought content normal.     ED Results / Procedures / Treatments   Labs (all labs ordered are listed, but only abnormal results are displayed) Labs Reviewed  COMPREHENSIVE METABOLIC PANEL - Abnormal; Notable for the following components:      Result Value   Glucose, Bld 167 (*)    GFR calc non Af Amer 60 (*)    All other components within normal limits  URINALYSIS, ROUTINE W REFLEX MICROSCOPIC - Abnormal; Notable for the following components:   Glucose,  UA >=500 (*)    All other components within normal limits  CBG  MONITORING, ED - Abnormal; Notable for the following components:   Glucose-Capillary 184 (*)    All other components within normal limits  CBC WITH DIFFERENTIAL/PLATELET    EKG EKG Interpretation  Date/Time:  Friday September 04 2019 19:31:55 EDT Ventricular Rate:  64 PR Interval:  212 QRS Duration: 88 QT Interval:  394 QTC Calculation: 406 R Axis:   43 Text Interpretation: Sinus rhythm with 1st degree A-V block Otherwise normal ECG Confirmed by Orpah Greek [phone]) on 09/05/2019 12:37:28 AM   Radiology MR ANGIO HEAD WO CONTRAST  Result Date: 09/05/2019 EXAM: MRI HEAD WITHOUT CONTRAST MRA HEAD WITHOUT CONTRAST MRA NECK WITHOUT AND WITH CONTRAST TECHNIQUE: Multiplanar, multiecho pulse sequences of the brain and surrounding structures were obtained without intravenous contrast. Angiographic images of the Circle of Willis were obtained using MRA technique without intravenous contrast. Angiographic images of the neck were obtained using MRA technique without and with intravenous contrast. Carotid stenosis measurements (when applicable) are obtained utilizing NASCET criteria, using the distal internal carotid diameter as the denominator. CONTRAST:  59mL GADAVIST GADOBUTROL 1 MMOL/ML IV SOLN COMPARISON:  None. FINDINGS: MRI HEAD FINDINGS BRAIN: The midline structures are normal. There is no acute infarct, acute hemorrhage or mass. The white matter signal is normal for the patient's age. The CSF spaces are normal for age, with no hydrocephalus. Blood-sensitive sequences show no chronic microhemorrhage or superficial siderosis. SKULL AND UPPER CERVICAL SPINE: The visualized skull base, calvarium, upper cervical spine and extracranial soft tissues are normal. SINUSES/ORBITS: No fluid levels or advanced mucosal thickening. No mastoid or middle ear effusion. The orbits are normal. MRA HEAD FINDINGS POSTERIOR CIRCULATION: --Basilar artery: Normal. --Posterior cerebral arteries: Normal. Both originate  from the basilar artery. --Superior cerebellar arteries: Normal. --Inferior cerebellar arteries: Normal anterior and posterior inferior cerebellar arteries. ANTERIOR CIRCULATION: --Intracranial internal carotid arteries: Normal. --Anterior cerebral arteries: Normal. Both A1 segments are present. Patent anterior communicating artery. --Middle cerebral arteries: Normal. --Posterior communicating arteries: Present on the right, absent on the left. MRA NECK FINDINGS Aortic arch: Normal 3 vessel aortic branching pattern. The visualized subclavian arteries are normal. Right carotid system: Normal course and caliber without stenosis or evidence of dissection. Left carotid system: Normal course and caliber without stenosis or evidence of dissection. Vertebral arteries: Left dominant. Vertebral artery origins are normal. Vertebral arteries are normal in course and caliber to the vertebrobasilar confluence without stenosis or evidence of dissection. IMPRESSION: 1. Normal MRI of the brain. 2. Normal MRA of the head and neck. Electronically Signed   By: Ulyses Jarred M.D.   On: 09/05/2019 04:20   MR Angiogram Neck W or Wo Contrast  Result Date: 09/05/2019 EXAM: MRI HEAD WITHOUT CONTRAST MRA HEAD WITHOUT CONTRAST MRA NECK WITHOUT AND WITH CONTRAST TECHNIQUE: Multiplanar, multiecho pulse sequences of the brain and surrounding structures were obtained without intravenous contrast. Angiographic images of the Circle of Willis were obtained using MRA technique without intravenous contrast. Angiographic images of the neck were obtained using MRA technique without and with intravenous contrast. Carotid stenosis measurements (when applicable) are obtained utilizing NASCET criteria, using the distal internal carotid diameter as the denominator. CONTRAST:  21mL GADAVIST GADOBUTROL 1 MMOL/ML IV SOLN COMPARISON:  None. FINDINGS: MRI HEAD FINDINGS BRAIN: The midline structures are normal. There is no acute infarct, acute hemorrhage or  mass. The white matter signal is normal for the patient's age. The CSF spaces are normal for age,  with no hydrocephalus. Blood-sensitive sequences show no chronic microhemorrhage or superficial siderosis. SKULL AND UPPER CERVICAL SPINE: The visualized skull base, calvarium, upper cervical spine and extracranial soft tissues are normal. SINUSES/ORBITS: No fluid levels or advanced mucosal thickening. No mastoid or middle ear effusion. The orbits are normal. MRA HEAD FINDINGS POSTERIOR CIRCULATION: --Basilar artery: Normal. --Posterior cerebral arteries: Normal. Both originate from the basilar artery. --Superior cerebellar arteries: Normal. --Inferior cerebellar arteries: Normal anterior and posterior inferior cerebellar arteries. ANTERIOR CIRCULATION: --Intracranial internal carotid arteries: Normal. --Anterior cerebral arteries: Normal. Both A1 segments are present. Patent anterior communicating artery. --Middle cerebral arteries: Normal. --Posterior communicating arteries: Present on the right, absent on the left. MRA NECK FINDINGS Aortic arch: Normal 3 vessel aortic branching pattern. The visualized subclavian arteries are normal. Right carotid system: Normal course and caliber without stenosis or evidence of dissection. Left carotid system: Normal course and caliber without stenosis or evidence of dissection. Vertebral arteries: Left dominant. Vertebral artery origins are normal. Vertebral arteries are normal in course and caliber to the vertebrobasilar confluence without stenosis or evidence of dissection. IMPRESSION: 1. Normal MRI of the brain. 2. Normal MRA of the head and neck. Electronically Signed   By: Ulyses Jarred M.D.   On: 09/05/2019 04:20   MR BRAIN WO CONTRAST  Result Date: 09/05/2019 EXAM: MRI HEAD WITHOUT CONTRAST MRA HEAD WITHOUT CONTRAST MRA NECK WITHOUT AND WITH CONTRAST TECHNIQUE: Multiplanar, multiecho pulse sequences of the brain and surrounding structures were obtained without intravenous  contrast. Angiographic images of the Circle of Willis were obtained using MRA technique without intravenous contrast. Angiographic images of the neck were obtained using MRA technique without and with intravenous contrast. Carotid stenosis measurements (when applicable) are obtained utilizing NASCET criteria, using the distal internal carotid diameter as the denominator. CONTRAST:  40mL GADAVIST GADOBUTROL 1 MMOL/ML IV SOLN COMPARISON:  None. FINDINGS: MRI HEAD FINDINGS BRAIN: The midline structures are normal. There is no acute infarct, acute hemorrhage or mass. The white matter signal is normal for the patient's age. The CSF spaces are normal for age, with no hydrocephalus. Blood-sensitive sequences show no chronic microhemorrhage or superficial siderosis. SKULL AND UPPER CERVICAL SPINE: The visualized skull base, calvarium, upper cervical spine and extracranial soft tissues are normal. SINUSES/ORBITS: No fluid levels or advanced mucosal thickening. No mastoid or middle ear effusion. The orbits are normal. MRA HEAD FINDINGS POSTERIOR CIRCULATION: --Basilar artery: Normal. --Posterior cerebral arteries: Normal. Both originate from the basilar artery. --Superior cerebellar arteries: Normal. --Inferior cerebellar arteries: Normal anterior and posterior inferior cerebellar arteries. ANTERIOR CIRCULATION: --Intracranial internal carotid arteries: Normal. --Anterior cerebral arteries: Normal. Both A1 segments are present. Patent anterior communicating artery. --Middle cerebral arteries: Normal. --Posterior communicating arteries: Present on the right, absent on the left. MRA NECK FINDINGS Aortic arch: Normal 3 vessel aortic branching pattern. The visualized subclavian arteries are normal. Right carotid system: Normal course and caliber without stenosis or evidence of dissection. Left carotid system: Normal course and caliber without stenosis or evidence of dissection. Vertebral arteries: Left dominant. Vertebral artery  origins are normal. Vertebral arteries are normal in course and caliber to the vertebrobasilar confluence without stenosis or evidence of dissection. IMPRESSION: 1. Normal MRI of the brain. 2. Normal MRA of the head and neck. Electronically Signed   By: Ulyses Jarred M.D.   On: 09/05/2019 04:20    Procedures Procedures (including critical care time)  Medications Ordered in ED Medications  gadobutrol (GADAVIST) 1 MMOL/ML injection 10 mL (10 mLs Intravenous Contrast Given 09/05/19 0407)  ED Course  I have reviewed the triage vital signs and the nursing notes.  Pertinent labs & imaging results that were available during my care of the patient were reviewed by me and considered in my medical decision making (see chart for details).    MDM Rules/Calculators/A&P                          Patient presents to the emergency department for evaluation of sudden onset dizziness which caused a fall.  Symptoms have resolved and he has a normal neurologic exam.  No injury from the fall.  There is nothing to indicate that this was cardiac, patient had a sense of vertigo at that time.  MRI and MRA are normal.  As patient is back to his normal baseline, no further work-up necessary.  Addendum: Patient had sudden onset of vertigo and differential diagnosis of this includes posterior circulation stroke.  An MRI was therefore ordered to determine if he has had a stroke causing his vertigo.  Additionally, posterior circulation abnormalities in the vessels can result in symptomatic dizziness and vertigo as well.  MR AAA of neck and brain was therefore ordered to determine if there was any narrowing, aneurysm, etc. that would explain the patient's neurologic findings.  Final Clinical Impression(s) / ED Diagnoses Final diagnoses:  Vertigo    Rx / DC Orders ED Discharge Orders    None       Jolleen Seman, Gwenyth Allegra, MD 09/05/19 0425    Orpah Greek, MD 10/03/19 [phone]

## 2019-09-05 NOTE — ED Notes (Signed)
Pt did not answer called multiples times

## 2019-12-16 IMAGING — DX DG CHEST 2V
2 series · 2 of 2 positions shown · non-contrast
Comparison: Chest radiograph performed 04/05/2017

CLINICAL DATA: Acute onset of right arm pain.

EXAM:
CHEST - 2 VIEW

[chest pa]
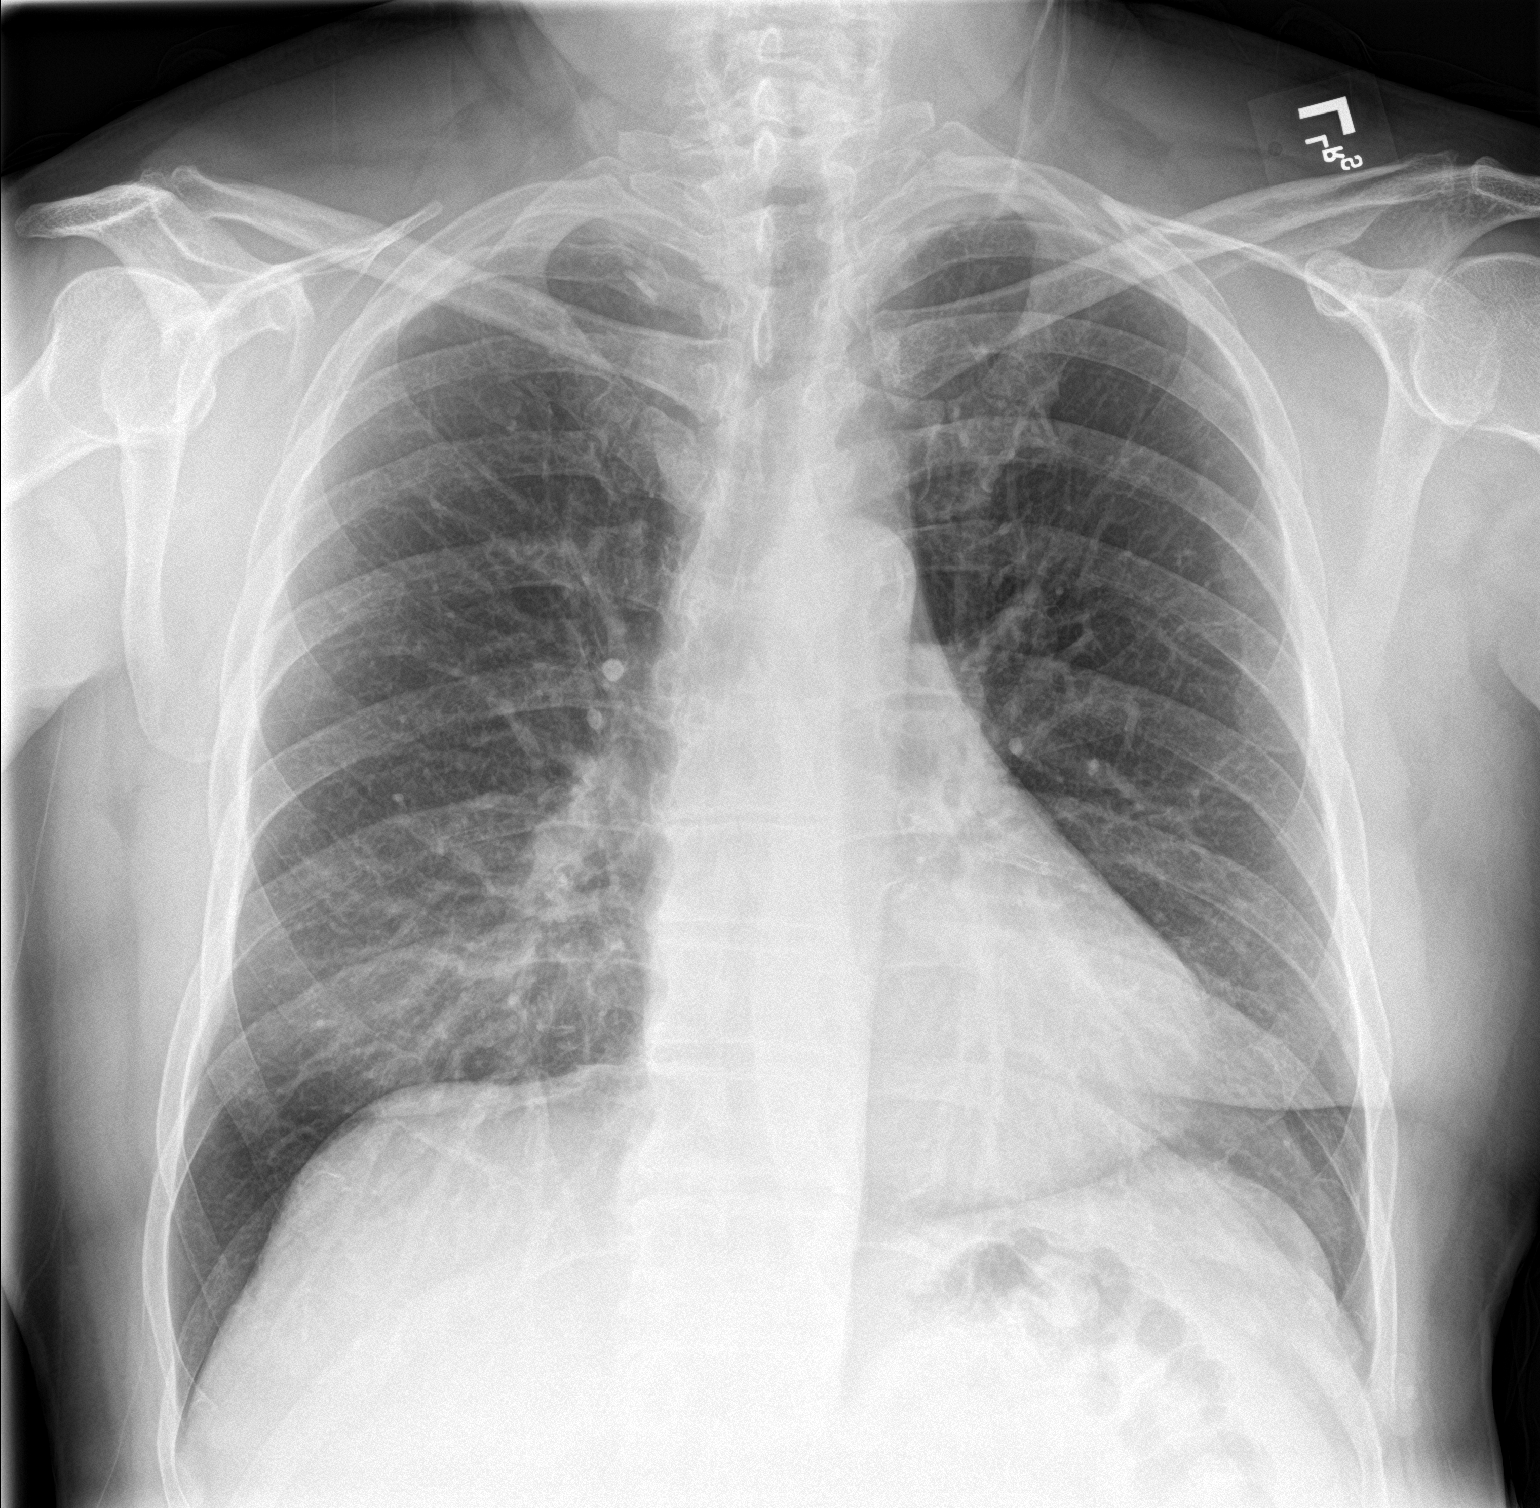

[chest lat]
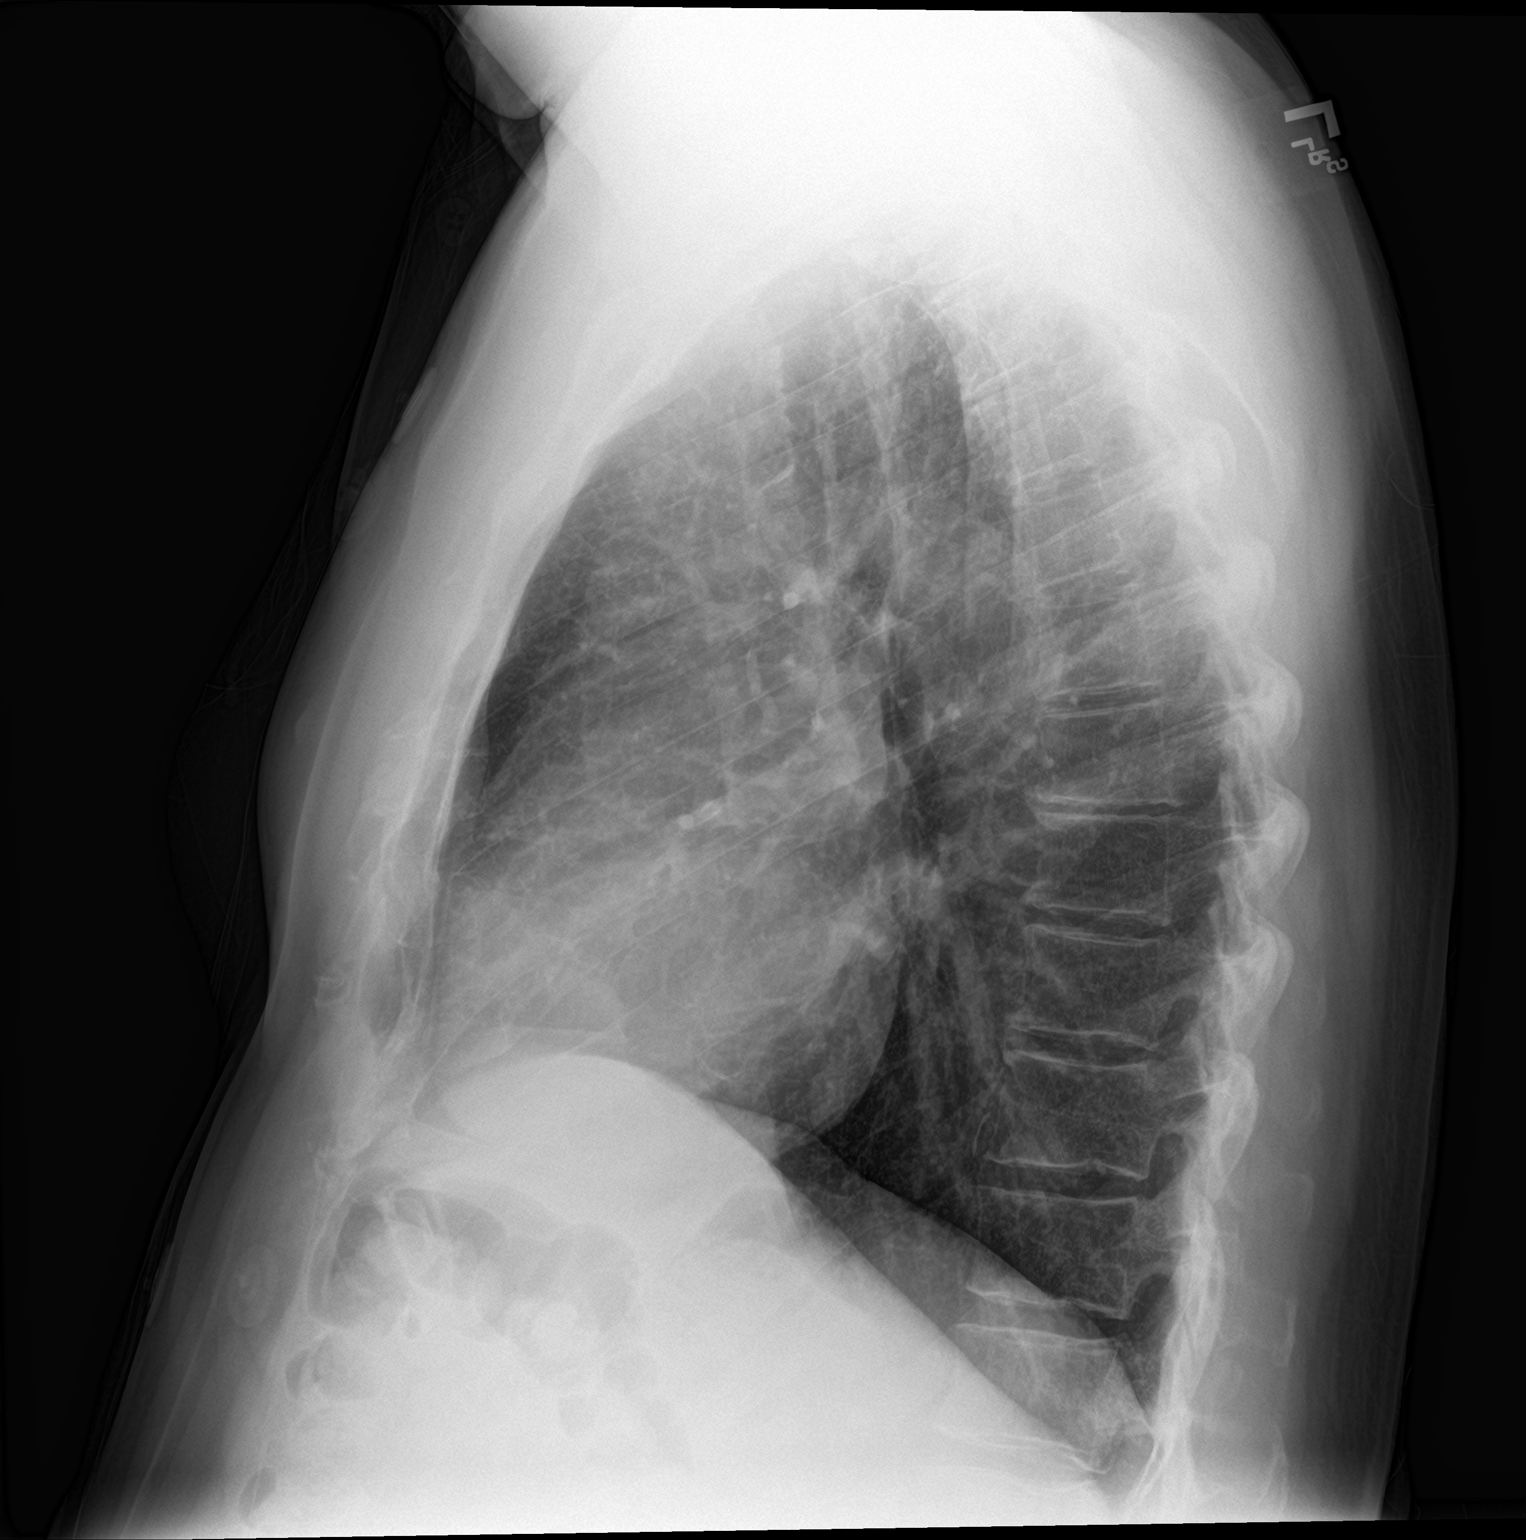

[2 of 2 positions shown; findings below may reference images not displayed]

FINDINGS: The lungs are well-aerated. Apparent right basilar airspace opacity
may reflect pneumonia, depending on the patient's symptoms. There is
no evidence of pleural effusion or pneumothorax.

The heart is normal in size; the mediastinal contour is within
normal limits. No acute osseous abnormalities are seen.
IMPRESSION: Apparent right basilar airspace opacity may reflect pneumonia,
depending on the patient's symptoms.

## 2020-02-15 ENCOUNTER — Other Ambulatory Visit: Payer: Self-pay | Admitting: Physician Assistant

## 2020-02-15 ENCOUNTER — Other Ambulatory Visit: Payer: Self-pay | Admitting: Cardiovascular Disease

## 2020-03-09 ENCOUNTER — Other Ambulatory Visit: Payer: Self-pay | Admitting: Cardiovascular Disease

## 2020-03-15 ENCOUNTER — Other Ambulatory Visit: Payer: Self-pay | Admitting: *Deleted

## 2020-03-15 DIAGNOSIS — Z87891 Personal history of nicotine dependence: Secondary | ICD-10-CM

## 2020-04-12 ENCOUNTER — Other Ambulatory Visit: Payer: Self-pay | Admitting: Cardiovascular Disease

## 2020-04-20 ENCOUNTER — Ambulatory Visit
Admission: RE | Admit: 2020-04-20 | Discharge: 2020-04-20 | Disposition: A | Discharge status: Home / Self Care | Payer: Federal, State, Local not specified - PPO | Source: Ambulatory Visit | Attending: Acute Care | Admitting: Acute Care

## 2020-04-20 ENCOUNTER — Other Ambulatory Visit: Payer: Self-pay

## 2020-04-20 ENCOUNTER — Encounter: Payer: Self-pay | Admitting: Acute Care

## 2020-04-20 ENCOUNTER — Ambulatory Visit (INDEPENDENT_AMBULATORY_CARE_PROVIDER_SITE_OTHER): Payer: Medicare Other | Admitting: Acute Care

## 2020-04-20 VITALS — BP 112/58 | HR 67 | Temp 97.3°F | Ht 70.0 in | Wt 214.6 lb

## 2020-04-20 DIAGNOSIS — Z87891 Personal history of nicotine dependence: Secondary | ICD-10-CM

## 2020-04-20 DIAGNOSIS — Z122 Encounter for screening for malignant neoplasm of respiratory organs: Secondary | ICD-10-CM

## 2020-04-20 NOTE — Progress Notes (Signed)
Shared Decision Making Visit Lung Cancer Screening Program [phone])   Eligibility:  Age 75 y.o.  Pack Years Smoking History Calculation 34 pack year smoking history (# packs/per year x # years smoked)  Recent History of coughing up blood  no  Unexplained weight loss? no ( >Than 15 pounds within the last 6 months )  Prior History Lung / other cancer no (Diagnosis within the last 5 years already requiring surveillance chest CT Scans).  Smoking Status Former Smoker  Former Smokers: Years since quit: 13 years  Quit Date: 2009  Visit Components:  Discussion included one or more decision making aids. yes  Discussion included risk/benefits of screening. yes  Discussion included potential follow up diagnostic testing for abnormal scans. yes  Discussion included meaning and risk of over diagnosis. yes  Discussion included meaning and risk of False Positives. yes  Discussion included meaning of total radiation exposure. yes  Counseling Included:  Importance of adherence to annual lung cancer LDCT screening. yes  Impact of comorbidities on ability to participate in the program. yes  Ability and willingness to under diagnostic treatment. yes  Smoking Cessation Counseling:  Current Smokers:   Discussed importance of smoking cessation. yes  Information about tobacco cessation classes and interventions provided to patient. yes  Patient provided with "ticket" for LDCT Scan. yes  Symptomatic Patient. yes  CounselingNA  Diagnosis Code: Tobacco Use Z72.0  Asymptomatic Patient yes  Counseling (Intermediate counseling: > three minutes counseling) W5809  Former Smokers:   Discussed the importance of maintaining cigarette abstinence. yes  Diagnosis Code: Personal History of Nicotine Dependence. X83.382  Information about tobacco cessation classes and interventions provided to patient. Yes  Patient provided with "ticket" for LDCT Scan. yes  Written Order for Lung  Cancer Screening with LDCT placed in Epic. Yes (CT Chest Lung Cancer Screening Low Dose W/O CM) [id number] Z12.2-Screening of respiratory organs Z87.891-Personal history of nicotine dependence   I spent 25 minutes of face to face time with Mr. Justin Carr discussing the risks and benefits of lung cancer screening. We viewed a power point together that explained in detail the above noted topics. We took the time to pause the power point at intervals to allow for questions to be asked and answered to ensure understanding. We discussed that he had taken the single most powerful action possible to decrease his risk of developing lung cancer when he quit smoking. I counseled him to remain smoke free, and to contact me if he ever had the desire to smoke again so that I can provide resources and tools to help support the effort to remain smoke free. We discussed the time and location of the scan, and that either  Doroteo Glassman RN or I will call with the results within  24-48 hours of receiving them. He has my card and contact information in the event he needs to speak with me, in addition to a copy of the power point we reviewed as a resource. He verbalized understanding of all of the above and had no further questions upon leaving the office.     I explained to the patient that there has been a high incidence of coronary artery disease noted on these exams. I explained that this is a non-gated exam therefore degree or severity cannot be determined. This patient is Currently on statin therapy. I have asked the patient to follow-up with their PCP regarding any incidental finding of coronary artery disease and management with diet or medication as they feel is  clinically indicated. The patient verbalized understanding of the above and had no further questions.    Justin Spatz, NP 04/20/2020

## 2020-04-20 NOTE — Patient Instructions (Signed)
Thank you for participating in the [facility]. It was our pleasure to meet you today. We will call you with the results of your scan within the next few days. Your scan will be assigned a Lung RADS category score by the physicians reading the scans.  This Lung RADS score determines follow up scanning.  See below for description of categories, and follow up screening recommendations. We will be in touch to schedule your follow up screening annually or based on recommendations of our providers. We will fax a copy of your scan results to your Primary Care Physician, or the physician who referred you to the program, to ensure they have the results. Please call the office if you have any questions or concerns regarding your scanning experience or results.  Our office number is [phone]. Please speak with Denise Phelps, RN. She is our Lung Cancer Screening RN. If she is unavailable when you call, please have the office staff send her a message. She will return your call at her earliest convenience. Remember, if your scan is normal, we will scan you annually as long as you continue to meet the criteria for the program. (Age 55-77, Current smoker or smoker who has quit within the last 15 years). If you are a smoker, remember, quitting is the single most powerful action that you can take to decrease your risk of lung cancer and other pulmonary, breathing related problems. We know quitting is hard, and we are here to help.  Please let us know if there is anything we can do to help you meet your goal of quitting. If you are a former smoker, congratulations. We are proud of you! Remain smoke free! Remember you can refer friends or family members through the number above.  We will screen them to make sure they meet criteria for the program. Thank you for helping us take better care of you by participating in Lung Screening.  Lung RADS Categories:  Lung RADS 1: no nodules  or definitely non-concerning nodules.  Recommendation is for a repeat annual scan in 12 months.  Lung RADS 2:  nodules that are non-concerning in appearance and behavior with a very low likelihood of becoming an active cancer. Recommendation is for a repeat annual scan in 12 months.  Lung RADS 3: nodules that are probably non-concerning , includes nodules with a low likelihood of becoming an active cancer.  Recommendation is for a 6-month repeat screening scan. Often noted after an upper respiratory illness. We will be in touch to make sure you have no questions, and to schedule your 6-month scan.  Lung RADS 4 A: nodules with concerning findings, recommendation is most often for a follow up scan in 3 months or additional testing based on our provider's assessment of the scan. We will be in touch to make sure you have no questions and to schedule the recommended 3 month follow up scan.  Lung RADS 4 B:  indicates findings that are concerning. We will be in touch with you to schedule additional diagnostic testing based on our provider's  assessment of the scan.   

## 2020-04-26 ENCOUNTER — Other Ambulatory Visit: Payer: Self-pay

## 2020-04-26 ENCOUNTER — Ambulatory Visit (INDEPENDENT_AMBULATORY_CARE_PROVIDER_SITE_OTHER): Payer: Medicare Other | Admitting: Cardiovascular Disease

## 2020-04-26 ENCOUNTER — Encounter: Payer: Self-pay | Admitting: Cardiovascular Disease

## 2020-04-26 DIAGNOSIS — E782 Mixed hyperlipidemia: Secondary | ICD-10-CM

## 2020-04-26 DIAGNOSIS — I10 Essential (primary) hypertension: Secondary | ICD-10-CM | POA: Diagnosis not present

## 2020-04-26 DIAGNOSIS — G4733 Obstructive sleep apnea (adult) (pediatric): Secondary | ICD-10-CM

## 2020-04-26 DIAGNOSIS — I2510 Atherosclerotic heart disease of native coronary artery without angina pectoris: Secondary | ICD-10-CM

## 2020-04-26 NOTE — Assessment & Plan Note (Signed)
History of CAD status post non-STEMI 07/02/2007.  He underwent PCI and stenting of the proximal LAD with a Taxus Liberte today drug-eluting stent.  He had Myoview stress testing performed 04/04/2017 that showed ischemia in the LAD territory.  I performed a radial diagnostic cath on him 1/70/19 revealing a 99% "in-stent restenosis" within the proximal LAD stent.  The remainder of his anatomy was free of significant disease and he had normal LV function.  I restented him with a 3 mm x 20 mm long Synergy drug-eluting stent postdilated to 3.23 mm with an excellent result.  He is remained asymptomatic since.

## 2020-04-26 NOTE — Assessment & Plan Note (Signed)
History of obstructive sleep apnea on CPAP. 

## 2020-04-26 NOTE — Assessment & Plan Note (Signed)
History of essential hypertension a blood pressure measured today 120/60.  He is on ramipril. °

## 2020-04-26 NOTE — Patient Instructions (Signed)

## 2020-04-26 NOTE — Addendum Note (Signed)
Addended by: Beatrix Fetters on: 04/26/2020 09:27 AM   Modules accepted: Orders

## 2020-04-26 NOTE — Assessment & Plan Note (Signed)
History of hyperlipidemia on statin therapy with lipid profile performed 02/29/2020 revealing total cholesterol 143, LDL 77 and HDL 42.

## 2020-04-26 NOTE — Progress Notes (Signed)
04/26/2020 Justin Carr   1945-05-19  [id number]  Primary Physician Shirline Frees, MD Primary Cardiologist: [name] MD [name], [place]  HPI:  Justin Carr is a 75 y.o. male   mildly overweight married Caucasian male, father of 2 children (1 living), who II last saw in the office  02/24/2019. 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 His problems include obstructive sleep apnea, on CPAP, hypertension, and hyperlipidemia. He denies chest pain or shortness of breath. He had a non-ST-segment-elevation myocardial infarction, July 02, 2007. He underwent PCI and stenting of his proximal LAD with a Taxus Liberte drug-eluting stent. He had also had a 60% right renal artery stenosis documented angiographically at that time, which we have been following by duplex ultrasound. Since I saw him back a year ago he denies chest pain or shortness of breathbut has noticed some left upper extremity discomfort with exertion similar to his pre-MI symptoms.Marland Kitchen  He  underwent Myoview stress testing 04/04/17 that showed ischemia in the LAD territory. I performed radial diagnostic cath on him 04/11/17 revealed a 99% "in-stent restenosis" within the proximal LAD stent. Remainder of his coronary anatomy was free of significant disease in his LV function was normal. I restarted him with a 3 mm x 20 mm long synergy drug-eluting stent postdilated to 3.23 mm resulting reduction a 9% stenosis to 0% residual. He has done well since.  Since I saw him in the office  a year ago he continues to do well.  He did have an episode back in June of last year and went to the emergency room with symptoms that sounded like vertigo although they quickly disappeared.  No cause of his dizziness was ever determined.  He denies chest pain or shortness of breath.  He is most bothered by symptomatic peripheral neuropathy thought  to be diabetic in nature.    Current Meds  Medication Sig  . aspirin 81 MG tablet Take 1 tablet (81 mg total) by mouth daily. (Patient taking differently: Take 81 mg by mouth at bedtime.)  . clopidogrel (PLAVIX) 75 MG tablet Take 1 tablet by mouth once daily  . Coenzyme Q10 (COQ-10 PO) Take 1 capsule by mouth at bedtime.   . dapagliflozin propanediol (FARXIGA) 5 MG TABS tablet Take 5 mg by mouth daily.  . famotidine (PEPCID) 20 MG tablet Take 1 tablet (20 mg total) by mouth 2 (two) times daily.  . metFORMIN (GLUCOPHAGE) 500 MG tablet Take 1,000 mg by mouth 2 (two) times daily.  . Multiple Vitamin (MULTIVITAMIN WITH MINERALS) TABS tablet Take 1 tablet by mouth daily.  . nitroGLYCERIN (NITROSTAT) 0.4 MG SL tablet DISSOLVE ONE TABLET UNDER THE TONGUE EVERY 5 MINUTES AS NEEDED FOR CHEST PAIN.  DO NOT EXCEED A TOTAL OF 3 DOSES IN 15 MINUTES (Patient taking differently: Place 0.4 mg under the tongue every 5 (five) minutes as needed for chest pain.)  . pregabalin (LYRICA) 75 MG capsule Take 100 mg by mouth 2 (two) times daily.  . ramipril (ALTACE) 10 MG capsule Take 1 capsule by mouth once daily  . rosuvastatin (CRESTOR) 20 MG tablet TAKE 1 TABLET BY MOUTH THREE TIMES A WEEK  . sildenafil (REVATIO) 20 MG tablet Take 20-100 mg by mouth as needed (for erectile disfunction).      Allergies  Allergen Reactions  . Peanuts [Peanut Oil] Anaphylaxis  . Lipitor [Atorvastatin]  Other (See Comments)    Muscle cramps  . Rosuvastatin Other (See Comments)    Muscle cramps if taken daily-pt still takes drug, but takes 3 times a week.    Social History   Socioeconomic History  . Marital status: Married    Spouse name: Not on file  . Number of children: 2  . Years of education: College +  . Highest education level: Not on file  Occupational History  . Occupation: Retired  Tobacco Use  . Smoking status: Former Smoker    Packs/day: 1.00    Years: 34.00    Pack years: 34.00    Types: Cigarettes     Quit date: 07/03/2007    Years since quitting: 12.8  . Smokeless tobacco: Never Used  Vaping Use  . Vaping Use: Never used  Substance and Sexual Activity  . Alcohol use: Yes    Comment: 04/11/2017 "might have a beer q other week"  . Drug use: No  . Sexual activity: Not Currently  Other Topics Concern  . Not on file  Social History Narrative   1 cup coffee and 2 sodas per day.   Lives at home with his wife.   Right-handed.   Social Determinants of Health   Financial Resource Strain: Not on file  Food Insecurity: Not on file  Transportation Needs: Not on file  Physical Activity: Not on file  Stress: Not on file  Social Connections: Not on file  Intimate Partner Violence: Not on file     Review of Systems: General: negative for chills, fever, night sweats or weight changes.  Cardiovascular: negative for chest pain, dyspnea on exertion, edema, orthopnea, palpitations, paroxysmal nocturnal dyspnea or shortness of breath Dermatological: negative for rash Respiratory: negative for cough or wheezing Urologic: negative for hematuria Abdominal: negative for nausea, vomiting, diarrhea, bright red blood per rectum, melena, or hematemesis Neurologic: negative for visual changes, syncope, or dizziness All other systems reviewed and are otherwise negative except as noted above.    Blood pressure 120/60, pulse (!) 53, resp. rate 18, height 5\' 10"  (1.778 m), weight 216 lb 6.4 oz (98.2 kg).  General appearance: alert and no distress Neck: no adenopathy, no carotid bruit, no JVD, supple, symmetrical, trachea midline and thyroid not enlarged, symmetric, no tenderness/mass/nodules Lungs: clear to auscultation bilaterally Heart: regular rate and rhythm, S1, S2 normal, no murmur, click, rub or gallop Extremities: extremities normal, atraumatic, no cyanosis or edema Pulses: 2+ and symmetric Skin: Skin color, texture, turgor normal. No rashes or lesions Neurologic: Alert and oriented X 3, normal  strength and tone. Normal symmetric reflexes. Normal coordination and gait  EKG sinus bradycardia 52 without ST or T wave changes.  I personally reviewed this EKG.  ASSESSMENT AND PLAN:   Coronary artery disease History of CAD status post non-STEMI 07/02/2007.  He underwent PCI and stenting of the proximal LAD with a Taxus Liberte today drug-eluting stent.  He had Myoview stress testing performed 04/04/2017 that showed ischemia in the LAD territory.  I performed a radial diagnostic cath on him 1/70/19 revealing a 99% "in-stent restenosis" within the proximal LAD stent.  The remainder of his anatomy was free of significant disease and he had normal LV function.  I restented him with a 3 mm x 20 mm long Synergy drug-eluting stent postdilated to 3.23 mm with an excellent result.  He is remained asymptomatic since.  Essential hypertension History of essential hypertension a blood pressure measured today 120/60.  He is on ramipril.  Hyperlipidemia History of hyperlipidemia on statin therapy with lipid profile performed 02/29/2020 revealing total cholesterol 143, LDL 77 and HDL 42.  Obstructive sleep apnea History of obstructive sleep apnea on CPAP      [name] MD [facility], [facility] 04/26/2020 9:21 AM

## 2020-05-03 NOTE — Progress Notes (Signed)
Please call patient and let them  know their  low dose Ct was read as a Lung RADS 2: nodules that are benign in appearance and behavior with a very low likelihood of becoming a clinically active cancer due to size or lack of growth. Recommendation per radiology is for a repeat LDCT in 12 months. .Please let them  know we will order and schedule their  annual screening scan for 03/2021. Please let them  know there was notation of CAD on their  scan.  Please remind the patient  that this is a non-gated exam therefore degree or severity of disease  cannot be determined. Please have them  follow up with their PCP regarding potential risk factor modification, dietary therapy or pharmacologic therapy if clinically indicated. Pt.  is  currently on statin therapy. Please place order for annual  screening scan for  03/2021 and fax results to PCP. Thanks so much.  Please let patient know there was notation of CAD. He is on a statin and followed by cards.

## 2020-05-04 ENCOUNTER — Other Ambulatory Visit: Payer: Self-pay | Admitting: *Deleted

## 2020-05-04 DIAGNOSIS — Z87891 Personal history of nicotine dependence: Secondary | ICD-10-CM

## 2020-05-12 ENCOUNTER — Other Ambulatory Visit: Payer: Self-pay | Admitting: Cardiovascular Disease

## 2020-06-02 ENCOUNTER — Other Ambulatory Visit: Payer: Self-pay | Admitting: Cardiovascular Disease

## 2020-07-12 ENCOUNTER — Other Ambulatory Visit: Payer: Self-pay | Admitting: Cardiovascular Disease

## 2020-08-18 ENCOUNTER — Other Ambulatory Visit: Payer: Self-pay | Admitting: Cardiovascular Disease

## 2020-11-03 DIAGNOSIS — G629 Polyneuropathy, unspecified: Secondary | ICD-10-CM | POA: Insufficient documentation

## 2020-11-04 ENCOUNTER — Telehealth: Payer: Self-pay | Admitting: *Deleted

## 2020-11-04 NOTE — Telephone Encounter (Signed)
   [facility] Pre-operative Risk Assessment    Patient Name: Justin Carr  DOB: October 29, 1945 MRN: [id number]  HEARTCARE STAFF:  - IMPORTANT!!!!!! Under Visit Info/Reason for Call, type in Other and utilize the format Clearance MM/DD/YY or Clearance TBD. Do not use dashes or single digits. - Please review there is not already an duplicate clearance open for this procedure. - If request is for dental extraction, please clarify the # of teeth to be extracted. - If the patient is currently at the dentist's office, call Pre-Op Callback Staff (MA/nurse) to input urgent request.  - If the patient is not currently in the dentist office, please route to the Pre-Op pool.  Request for surgical clearance:  What type of surgery is being performed? Combination of bilateral direct brow and blepharoplasty  When is this surgery scheduled? TBD  What type of clearance is required (medical clearance vs. Pharmacy clearance to hold med vs. Both)? both  Are there any medications that need to be held prior to surgery and how long? Plavix-need direction  Practice name and name of physician performing surgery? Sarajane Marek MD  What is the office phone number? [phone]   7.   What is the office fax number? [phone]  8.   Anesthesia type (None, local, MAC, general) ? Not listed-office is closed   Fredia Beets 11/04/2020, 11:58 AM  _________________________________________________________________   (provider comments below)

## 2020-11-07 NOTE — Telephone Encounter (Signed)
   Name: Justin Carr  DOB: June 10, 1945  MRN: [id number]   Primary Cardiologist: Quay Burow, MD  Chart reviewed as part of pre-operative protocol coverage. Patient was contacted 11/07/2020 in reference to pre-operative risk assessment for pending surgery as outlined below.  Justin Carr was last seen on 04/26/20 by Dr. [name].  Since that day, Justin Carr has done fine from a cardiac standpoint. He tells me he is planning to delay this procedure until 03/2021, at which time he was informed that his surgeons office will need to resubmit a preop assessment request closer to his procedure date. He will be due for his annual appt 04/2021 and asked to move it up a couple months to address his preop status at that time which is a reasonable request.   I will route to scheduling to arrange a follow-up visit with Dr. [name] 02/2021.   Pre-op covering staff: - Please contact requesting surgeon's office via preferred method (i.e, phone, fax) to inform them of need to resubmit a clearance request closer to his planned procedure 03/2021   Abigail Butts, PA-C  11/07/2020, 3:41 PM

## 2020-11-09 NOTE — Telephone Encounter (Signed)
Sent to requesting surgeon's office to make them aware to resend the clearance closer to the procedure date.

## 2020-11-09 NOTE — Telephone Encounter (Signed)
Left message for pt to call back.  Pt needs to schedule a follow-up appointment with Dr. [name] in December.

## 2020-11-09 NOTE — Telephone Encounter (Signed)
Patient has been scheduled to see Dr. [name] 03/07/21 at 10:00 am

## 2020-11-16 ENCOUNTER — Other Ambulatory Visit: Payer: Self-pay | Admitting: Cardiovascular Disease

## 2020-11-17 NOTE — Telephone Encounter (Signed)
Rx(s) sent to pharmacy electronically.  

## 2020-11-27 ENCOUNTER — Other Ambulatory Visit: Payer: Self-pay | Admitting: Cardiovascular Disease

## 2021-03-07 ENCOUNTER — Encounter: Payer: Self-pay | Admitting: Cardiovascular Disease

## 2021-03-07 ENCOUNTER — Ambulatory Visit (INDEPENDENT_AMBULATORY_CARE_PROVIDER_SITE_OTHER): Payer: Medicare Other | Admitting: Cardiovascular Disease

## 2021-03-07 ENCOUNTER — Other Ambulatory Visit: Payer: Self-pay

## 2021-03-07 VITALS — BP 120/60 | HR 56 | Ht 70.0 in | Wt 215.0 lb

## 2021-03-07 DIAGNOSIS — I2510 Atherosclerotic heart disease of native coronary artery without angina pectoris: Secondary | ICD-10-CM

## 2021-03-07 DIAGNOSIS — Z01810 Encounter for preprocedural cardiovascular examination: Secondary | ICD-10-CM

## 2021-03-07 DIAGNOSIS — I10 Essential (primary) hypertension: Secondary | ICD-10-CM | POA: Diagnosis not present

## 2021-03-07 DIAGNOSIS — R0989 Other specified symptoms and signs involving the circulatory and respiratory systems: Secondary | ICD-10-CM

## 2021-03-07 DIAGNOSIS — G4733 Obstructive sleep apnea (adult) (pediatric): Secondary | ICD-10-CM

## 2021-03-07 DIAGNOSIS — E782 Mixed hyperlipidemia: Secondary | ICD-10-CM | POA: Diagnosis not present

## 2021-03-07 DIAGNOSIS — Z01818 Encounter for other preprocedural examination: Secondary | ICD-10-CM

## 2021-03-07 NOTE — Patient Instructions (Signed)
Medication Instructions:  Your physician recommends that you continue on your current medications as directed. Please refer to the Current Medication list given to you today.  *If you need a refill on your cardiac medications before your next appointment, please call your pharmacy*   Testing/Procedures: Your physician has requested that you have a carotid duplex. This test is an ultrasound of the carotid arteries in your neck. It looks at blood flow through these arteries that supply the brain with blood. Allow one hour for this exam. There are no restrictions or special instructions.  Your physician has requested that you have a renal artery duplex. During this test, an ultrasound is used to evaluate blood flow to the kidneys. Allow one hour for this exam. Do not eat after midnight the day before and avoid carbonated beverages. Take your medications as you usually do.   These procedures are done at [facility].  Follow-Up: At [facility], you and your health needs are our priority.  As part of our continuing mission to provide you with exceptional heart care, we have created designated Provider Care Teams.  These Care Teams include your primary Cardiologist (physician) and Advanced Practice Providers (APPs -  Physician Assistants and Nurse Practitioners) who all work together to provide you with the care you need, when you need it.  We recommend signing up for the patient portal called "MyChart".  Sign up information is provided on this After Visit Summary.  MyChart is used to connect with patients for Virtual Visits (Telemedicine).  Patients are able to view lab/test results, encounter notes, upcoming appointments, etc.  Non-urgent messages can be sent to your provider as well.   To learn more about what you can do with MyChart, go to [email or web address].    Your next appointment:   12 month(s)  The format for your next appointment:   In Person  Provider:   Quay Burow,  MD

## 2021-03-07 NOTE — Progress Notes (Addendum)
03/29/2021 Justin Carr   1945/11/05  [id number]  Primary Physician Justin Frees, MD Primary Cardiologist: Justin Harp MD Justin Carr, [place]  HPI:  Justin Carr is a 75 y.o.  mildly overweight married Caucasian male, father of 2 children (1 living), who I  I last saw in the office 04/26/2020. 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 His problems include obstructive sleep apnea, on CPAP, hypertension, and hyperlipidemia. He denies chest pain or shortness of breath. He had a non-ST-segment-elevation myocardial infarction, July 02, 2007. He underwent PCI and stenting of his proximal LAD with a Taxus Liberte drug-eluting stent. He had also had a 60% right renal artery stenosis documented angiographically at that time, which we have been following by duplex ultrasound. Since I saw him back a year ago he denies chest pain or shortness of breath but has noticed some left upper extremity discomfort with exertion similar to his pre-MI symptoms. Marland Kitchen    He  underwent Myoview stress testing 04/04/17 that showed ischemia in the LAD territory. I performed radial diagnostic cath on him 04/11/17 revealed a 99% "in-stent restenosis" within the proximal LAD stent. Remainder of his coronary anatomy was free of significant disease in his LV function was normal. I restarted him with a 3 mm x 20 mm long synergy drug-eluting stent postdilated to 3.23 mm resulting reduction a 9% stenosis to 0% residual. He has done well since.   Since I saw him in the office 10 months ago he continues to do well.  He is scheduled for blepharoplasty next month which she is cleared for at low cardiac risk.  He denies chest pain or shortness of breath.  Current Meds  Medication Sig   Ascorbic Acid (VITAMIN C) 500 MG CAPS See admin instructions.   aspirin 81 MG tablet Take 1 tablet (81 mg total) by mouth daily. (Patient taking  differently: Take 81 mg by mouth at bedtime.)   Cholecalciferol (VITAMIN D) 50 MCG (2000 UT) tablet 1 tablet   clopidogrel (PLAVIX) 75 MG tablet Take 1 tablet by mouth once daily   Coenzyme Q10 (COQ-10 PO) Take 1 capsule by mouth at bedtime.    dapagliflozin propanediol (FARXIGA) 5 MG TABS tablet Take 5 mg by mouth daily.   famotidine (PEPCID) 20 MG tablet Take 1 tablet (20 mg total) by mouth 2 (two) times daily.   metFORMIN (GLUCOPHAGE) 500 MG tablet Take 500 mg by mouth 2 (two) times daily.   Multiple Vitamin (MULTIVITAMIN WITH MINERALS) TABS tablet Take 1 tablet by mouth daily.   Multiple Vitamins-Minerals (PRESERVISION AREDS PO) Take 1 capsule by mouth 2 (two) times daily.   nitroGLYCERIN (NITROSTAT) 0.4 MG SL tablet DISSOLVE ONE TABLET UNDER THE TONGUE EVERY 5 MINUTES AS NEEDED FOR CHEST PAIN.  DO NOT EXCEED A TOTAL OF 3 DOSES IN 15 MINUTES   pregabalin (LYRICA) 75 MG capsule Take 100 mg by mouth 2 (two) times daily.   ramipril (ALTACE) 10 MG capsule Take 1 capsule by mouth once daily   rosuvastatin (CRESTOR) 20 MG tablet TAKE 1 TABLET BY MOUTH THREE TIMES A WEEK   sildenafil (REVATIO) 20 MG tablet Take 20-100 mg by mouth as needed (for erectile disfunction).    TRULICITY 2.35 TD/3.2KG SOPN Inject into the skin once a week.     Allergies  Allergen Reactions   Peanuts [Peanut Oil] Anaphylaxis   Lipitor [  Atorvastatin] Other (See Comments)    Muscle cramps   Rosuvastatin Other (See Comments)    Muscle cramps if taken daily-pt still takes drug, but takes 3 times a week.    Social History   Socioeconomic History   Marital status: Married    Spouse name: Not on file   Number of children: 2   Years of education: College +   Highest education level: Not on file  Occupational History   Occupation: Retired  Tobacco Use   Smoking status: Former    Packs/day: 1.00    Years: 34.00    Pack years: 34.00    Types: Cigarettes    Quit date: 07/03/2007    Years since quitting: 13.7    Smokeless tobacco: Never  Vaping Use   Vaping Use: Never used  Substance and Sexual Activity   Alcohol use: Yes    Comment: 04/11/2017 "might have a beer q other week"   Drug use: No   Sexual activity: Not Currently  Other Topics Concern   Not on file  Social History Narrative   1 cup coffee and 2 sodas per day.   Lives at home with his wife.   Right-handed.   Social Determinants of Health   Financial Resource Strain: Not on file  Food Insecurity: Not on file  Transportation Needs: Not on file  Physical Activity: Not on file  Stress: Not on file  Social Connections: Not on file  Intimate Partner Violence: Not on file     Review of Systems: General: negative for chills, fever, night sweats or weight changes.  Cardiovascular: negative for chest pain, dyspnea on exertion, edema, orthopnea, palpitations, paroxysmal nocturnal dyspnea or shortness of breath Dermatological: negative for rash Respiratory: negative for cough or wheezing Urologic: negative for hematuria Abdominal: negative for nausea, vomiting, diarrhea, bright red blood per rectum, melena, or hematemesis Neurologic: negative for visual changes, syncope, or dizziness All other systems reviewed and are otherwise negative except as noted above.    Blood pressure 120/60, pulse (!) 56, height 5\' 10"  (1.778 m), weight 215 lb (97.5 kg), SpO2 96 %.  General appearance: alert and no distress Neck: no adenopathy, no JVD, supple, symmetrical, trachea midline, thyroid not enlarged, symmetric, no tenderness/mass/nodules, and soft right carotid Justin Carr Lungs: clear to auscultation bilaterally Heart: regular rate and rhythm, S1, S2 normal, no murmur, click, rub or gallop Extremities: extremities normal, atraumatic, no cyanosis or edema Pulses: 2+ and symmetric Skin: Skin color, texture, turgor normal. No rashes or lesions Neurologic: Grossly normal  EKG sinus bradycardia at 56 without ST or T wave changes.  I personally reviewed  this EKG.  ASSESSMENT AND PLAN:   Coronary artery disease History of CAD status post non-STEMI and PCI and stenting of the proximal LAD with a Taxus Liberte drug-eluting stent by myself 07/02/2007.  He underwent Myoview stress testing 04/04/2017 that showed ischemia in the LAD territory.  I performed radial diagnostic cath on him 04/11/2017 revealing a 99% "in-stent restenosis within the proximal LAD stent which I restented with a 3 mm x 20 mm long Synergy drug-eluting stent postdilated to 3.23 mm.  He is done well since.  He denies chest pain or shortness of breath.  Essential hypertension History of essential hypertension a blood pressure measured today 120/60.  He is on ramipril.  Hyperlipidemia History of hyperlipidemia on statin therapy with lipid profile performed 02/29/2020 revealing total cholesterol 143, LDL 77 and HDL 42.  More recent lab work performed 03/08/2021 revealed a total  cholesterol 144, LDL of 75 and HDL of 42.  Obstructive sleep apnea History of obstructive sleep apnea on CPAP  Renal artery stenosis (HCC) History of 60% right renal artery stenosis found at the time of original angiography in 2009.  We were following him by duplex ultrasound last tested in 2016.  I am going to repeat a renal Doppler study.  Preoperative clearance Patient scheduled for blepharoplasty in January.  He is cleared from a cardiac point of view.  It is okay for him to interrupt his antiplatelet therapy (Plavix).     Justin Harp MD FACP,FACC,FAHA, [facility] 03/29/2021 4:17 PM

## 2021-03-07 NOTE — Assessment & Plan Note (Signed)
History of obstructive sleep apnea on CPAP. 

## 2021-03-07 NOTE — Assessment & Plan Note (Addendum)
History of CAD status post non-STEMI and PCI and stenting of the proximal LAD with a Taxus Liberte drug-eluting stent by myself 07/02/2007.  He underwent Myoview stress testing 04/04/2017 that showed ischemia in the LAD territory.  I performed radial diagnostic cath on him 04/11/2017 revealing a 99% "in-stent restenosis within the proximal LAD stent which I restented with a 3 mm x 20 mm long Synergy drug-eluting stent postdilated to 3.23 mm.  He is done well since.  He denies chest pain or shortness of breath.

## 2021-03-07 NOTE — Assessment & Plan Note (Signed)
Patient scheduled for blepharoplasty in January.  He is cleared from a cardiac point of view.  It is okay for him to interrupt his antiplatelet therapy (Plavix).

## 2021-03-07 NOTE — Assessment & Plan Note (Signed)
History of essential hypertension a blood pressure measured today 120/60.  He is on ramipril.

## 2021-03-07 NOTE — Assessment & Plan Note (Addendum)
History of hyperlipidemia on statin therapy with lipid profile performed 02/29/2020 revealing total cholesterol 143, LDL 77 and HDL 42.  More recent lab work performed 03/08/2021 revealed a total cholesterol 144, LDL of 75 and HDL of 42.

## 2021-03-07 NOTE — Assessment & Plan Note (Signed)
History of 60% right renal artery stenosis found at the time of original angiography in 2009.  We were following him by duplex ultrasound last tested in 2016.  I am going to repeat a renal Doppler study.

## 2021-03-28 ENCOUNTER — Ambulatory Visit: Payer: Medicare Other | Attending: Otolaryngology

## 2021-03-28 ENCOUNTER — Other Ambulatory Visit: Payer: Self-pay

## 2021-03-28 DIAGNOSIS — R42 Dizziness and giddiness: Secondary | ICD-10-CM | POA: Diagnosis present

## 2021-03-28 DIAGNOSIS — R2681 Unsteadiness on feet: Secondary | ICD-10-CM | POA: Insufficient documentation

## 2021-03-28 NOTE — Patient Instructions (Signed)
Gaze Stabilization: Standing Feet Apart    Feet shoulder width apart, keeping eyes on target on wall 3-4 feet away, tilt head down 15-30 and move head side to side for 60 seconds. Repeat while moving head up and down for 60 seconds. Do 2-3 sessions per day. Progression: complete with feet apart/together on Foam.    Gaze Stabilization: Tip Card  1.Target must remain in focus, not blurry, and appear stationary while head is in motion. 2.Perform exercises with small head movements (45 to either side of midline). 3.Increase speed of head motion so long as target is in focus. 4.If you wear eyeglasses, be sure you can see target through lens (therapist will give specific instructions for bifocal / progressive lenses). 5.These exercises may provoke dizziness or nausea. Work through these symptoms. If too dizzy, slow head movement slightly. Rest between each exercise. 6.Exercises demand concentration; avoid distractions. 7.For safety, perform standing exercises close to a counter, wall, corner, or next to someone.    Feet Together (Compliant Surface) - Eyes Closed    Stand on compliant surface: on pillow/foam with feet together and arms out. Close eyes and visualize upright position. Hold 30 seconds. Repeat 3 times per session. Do 1 sessions per day.  Progression: with eyes closed, add in head turns to right and left.

## 2021-03-28 NOTE — Therapy (Signed)
[facility] [address] [place], [place], [address] Phone: [phone]   Fax:  [phone]  Physical Therapy Evaluation  Patient Details  Name: Justin Carr MRN: [id number] Date of Birth: [birth year] Referring Provider (PT): Leta Baptist, MD   Encounter Date: 03/28/2021   PT End of Session - 03/28/21 1029     Visit Number 1    Number of Visits 1    Date for PT Re-Evaluation 03/29/21    Authorization Type UHC Medicare    PT Start Time 0931    PT Stop Time 1014    PT Time Calculation (min) 43 min    Activity Tolerance Patient tolerated treatment well    Behavior During Therapy [facility] for tasks assessed/performed             Past Medical History:  Diagnosis Date   Coronary artery disease    GERD (gastroesophageal reflux disease)    Hyperlipidemia    Hypertension    NSTEMI (non-ST elevated myocardial infarction) ([facility]) 06/2007   [name] 07/27/2010   OSA on CPAP    Palpitations    Pneumonia ~ 1971 X 1   Type II diabetes mellitus ([facility])     Past Surgical History:  Procedure Laterality Date   CARDIOVASCULAR STRESS TEST  10/06/2007   No scintigraphic evidence of inducible myocardial ischemia. ECG positive for ischemia. Pharmacologically induced ST segment depression. Perfusion defect seen in inferior myocardial region consistent with diaphragmatic attenuation.   CORONARY ANGIOPLASTY WITH STENT PLACEMENT  07/03/2007   LAD 90% ulcerated plaque proximal between 1st and 2nd branches stented with a 2.75x108mm Taxus Liberte stent deployed at 16atm. Postdilated at 16atm (3.22mm). resulting in reduction of 90% lesion to 0% residual with TIMI3 flow. Impingement on theostium of diag branch dilatedat 6atm resulting in reduction of 90% ostial D2 to <50% residual.   CORONARY ANGIOPLASTY WITH STENT PLACEMENT  04/11/2017   INGUINAL HERNIA REPAIR Right 01/06/2018   Procedure: OPEN RIGHT INGUINAL HERNIA REPAIR WITH MESH;  Surgeon: Armandina Gemma,  MD;  Location: [facility];  Service: General;  Laterality: Right;   INSERTION OF MESH Right 01/06/2018   Procedure: INSERTION OF MESH;  Surgeon: Armandina Gemma, MD;  Location: [facility];  Service: General;  Laterality: Right;   LAPAROSCOPIC APPENDECTOMY N/A 04/01/2018   Procedure: APPENDECTOMY LAPAROSCOPIC;  Surgeon: Armandina Gemma, MD;  Location: [facility];  Service: General;  Laterality: N/A;   LEFT HEART CATH AND CORONARY ANGIOGRAPHY N/A 04/11/2017   Procedure: LEFT HEART CATH AND CORONARY ANGIOGRAPHY;  Surgeon: [name], MD;  Location: [facility];  Service: Cardiovascular;  Laterality: N/A;   LIGAMENT REPAIR Right 03/2001   wrist   RENAL ARTERIAL DOPPLER  09/14/2977   SMA and Cephalic artery >89% diameter reduction, Rt prox Renal artery 60-99% diameter reduction, Lft prox Renal artery 1-59% diameter reduction, kidneys are normal in size.   SHOULDER ARTHROSCOPY WITH SUBACROMIAL DECOMPRESSION Right 04/03/2013   Procedure: RIGHT SHOULDER ARTHROSCOPY WITH SUBACROMIAL DECOMPRESSION/DISTAL CLAVICLE RESECTION;  Surgeon: Marin Shutter, MD;  Location: [facility];  Service: Orthopedics;  Laterality: Right;   TONSILLECTOMY AND ADENOIDECTOMY  ~ 1952   TRANSTHORACIC ECHOCARDIOGRAM  10/06/2007   EF 63%, normal.   WISDOM TOOTH EXTRACTION     age 76    There were no vitals filed for this visit.    Subjective Assessment - 03/28/21 0933     Subjective "Not sure why I am here". Patient reports he has a weird sensation in the  L ear, reports it feels vacant. Went to ENT regarding this and recieved no answers. Reports he went for balance testing but unsure of results. Frustrated that PT does not have the results in hand. Patient reports one fall approx one year ago where he drifted off to the L Side, went to ED with no significant findings. States he feels unsteady at times. Denies room spinning, tinnitus, or vision changes. Has a history of sensorineural hearing loss, with asymmetry on the left side. Currently is  playing golf approx 2-3x/week.    Pertinent History CAD, GERD, HLD, HTN, Type 2 DM    Limitations Standing;Walking    Diagnostic tests MRI negative for intracranial lesion.    Currently in Pain? No/denies                [facility] PT Assessment - 03/28/21 0001       Assessment   Medical Diagnosis Dizziness    Referring Provider (PT) Leta Baptist, MD    Onset Date/Surgical Date 03/07/21    Prior Therapy Cardiac Rehab, Outpatient Ortho      Precautions   Precautions Fall;Other (comment)    Precaution Comments CAD, GERD, HLD, HTN, Type 2 DM      Balance Screen   Has the patient fallen in the past 6 months No    Has the patient had a decrease in activity level because of a fear of falling?  No    Is the patient reluctant to leave their home because of a fear of falling?  No      Home Ecologist residence    Living Arrangements Spouse/significant other    Available Help at Discharge Family    Type of Teague    Additional Comments denies difficulty getting in/around home      Prior Function   Level of Independence Independent    Vocation Retired    Leisure Merchandiser, retail; Walk Dogs      Cognition   Overall Cognitive Status Within Functional Limits for tasks assessed      Observation/Other Assessments   Focus on Therapeutic Outcomes Human resources officer)  Staff Did Not Capture      Sensation   Light Touch Impaired by gross assessment    Additional Comments History of Neuropathy in BLE      Coordination   Gross Motor Movements are Fluid and Coordinated Yes      ROM / Strength   AROM / PROM / Strength AROM;Strength      AROM   Overall AROM  Within functional limits for tasks performed    Overall AROM Comments for BLE's      Strength   Overall Strength Within functional limits for tasks performed    Overall Strength Comments for BLE's      Transfers   Transfers Sit to Stand;Stand to Sit    Sit to Stand 7: Independent    Stand to Sit 7: Independent       Ambulation/Gait   Ambulation/Gait Yes    Ambulation/Gait Assistance 7: Independent    Ambulation Distance (Feet) 50 Feet    Assistive device None    Gait Pattern Within Functional Limits    Ambulation Surface Level;Indoor      High Level Balance   High Level Balance Comments Completed M-CTSIB; Patient able to hold situation 1-4 for full 30 seconds, mild postural sway on situation 4.  Vestibular Assessment - 03/28/21 0001       Symptom Behavior   Subjective history of current problem see subjective    Type of Dizziness  Imbalance;Lightheadedness    Frequency of Dizziness intermittent    Symptom Nature Motion provoked;Intermittent    Aggravating Factors No known aggravating factors    Relieving Factors No known relieving factors    Progression of Symptoms No change since onset      Oculomotor Exam   Oculomotor Alignment Normal    Ocular ROM WNL    Spontaneous Absent    Gaze-induced  Absent    Smooth Pursuits Intact    Saccades Intact      Oculomotor Exam-Fixation Suppressed    Left Head Impulse Positive    Right Head Impulse Negative      Vestibulo-Ocular Reflex   VOR 1 Head Only (x 1 viewing) Normal    VOR Cancellation Normal      Visual Acuity   Static 6    Dynamic 8   no dizziness               Objective measurements completed on examination: See above findings.        Vestibular Treatment/Exercise - 03/28/21 0001       Vestibular Treatment/Exercise   Vestibular Treatment Provided Gaze    Gaze Exercises X1 Viewing Horizontal;X1 Viewing Vertical      X1 Viewing Horizontal   Foot Position Bil Stance    Reps 1    Comments x 60 seconds; educating on proper form/technique      X1 Viewing Vertical   Foot Position Bil Stance    Reps 1    Comments x 60 seconds; educating on proper form/technique                Balance Exercises - 03/28/21 0001       Balance Exercises: Standing   Standing Eyes Closed  Narrow base of support (BOS);Foam/compliant surface;3 reps;30 secs;Limitations    Standing Eyes Closed Limitations narrow BOS EC on airex, 2 x 30 seconds. educated on progression of addition of head turns.             Gaze Stabilization: Standing Feet Apart    Feet shoulder width apart, keeping eyes on target on wall 3-4 feet away, tilt head down 15-30 and move head side to side for 60 seconds. Repeat while moving head up and down for 60 seconds. Do 2-3 sessions per day. Progression: complete with feet apart/together on Foam.    Gaze Stabilization: Tip Card  1.Target must remain in focus, not blurry, and appear stationary while head is in motion. 2.Perform exercises with small head movements (45 to either side of midline). 3.Increase speed of head motion so long as target is in focus. 4.If you wear eyeglasses, be sure you can see target through lens (therapist will give specific instructions for bifocal / progressive lenses). 5.These exercises may provoke dizziness or nausea. Work through these symptoms. If too dizzy, slow head movement slightly. Rest between each exercise. 6.Exercises demand concentration; avoid distractions. 7.For safety, perform standing exercises close to a counter, wall, corner, or next to someone.    Feet Together (Compliant Surface) - Eyes Closed    Stand on compliant surface: on pillow/foam with feet together and arms out. Close eyes and visualize upright position. Hold 30 seconds. Repeat 3 times per session. Do 1 sessions per day.  Progression: with eyes closed, add in head turns to right and left.  PT Education - 03/28/21 1029     Education Details Eval Findings; HEP Provided    Person(s) Educated Patient    Methods Explanation;Demonstration;Handout    Comprehension Verbalized understanding;Returned demonstration                         Plan - 03/28/21 1034     Clinical Impression Statement Patient is a 76 y.o. male  referred to Neuro OPPT services for Dizziness. Patient's PMH includes the following: CAD, GERD, HLD, HTN, Type 2 DM. On evaluation, patietn is independent with mobility, no unsteadiness noted with transfers or amublation. Patient had normal oculomotor exam. Pt did present with positive HIT on L indicating impaired VOR. With M-CTSIB, patient able to maintain balance for all situation but increased sway with eyes closed. Therefore rest of session spent establishing initial HEP for patient to address deficitsi and self management of symptom at home. No further PT needed at this time.    Personal Factors and Comorbidities Comorbidity 3+;Time since onset of injury/illness/exacerbation    Comorbidities CAD, GERD, HLD, HTN, Type 2 DM    Examination-Participation Restrictions Community Activity    Stability/Clinical Decision Making Stable/Uncomplicated    Clinical Decision Making Low    Rehab Potential Good    PT Frequency One time visit    PT Treatment/Interventions Vestibular;Neuromuscular re-education;Balance training;Patient/family education;Therapeutic exercise    PT Next Visit Plan No further visits needed    PT Home Exercise Plan HEP Provided (VOR and Balance Eyes Closed)    Consulted and Agree with Plan of Care Patient             Patient will benefit from skilled therapeutic intervention in order to improve the following deficits and impairments:  Decreased balance, Dizziness  Visit Diagnosis: Unsteadiness on feet  Dizziness and giddiness     Problem List Patient Active Problem List   Diagnosis Date Noted   Preoperative clearance 03/07/2021   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 [place], [place], [address] Phone: [phone]   Fax:  [phone]  Name: Justin Carr MRN: [id number] Date of Birth: 04-24-1945

## 2021-04-11 ENCOUNTER — Ambulatory Visit (HOSPITAL_COMMUNITY)
Admission: RE | Admit: 2021-04-11 | Discharge: 2021-04-11 | Disposition: A | Discharge status: Home / Self Care | Payer: Medicare Other | Source: Ambulatory Visit | Attending: Internal Medicine | Admitting: Internal Medicine

## 2021-04-11 ENCOUNTER — Ambulatory Visit (HOSPITAL_BASED_OUTPATIENT_CLINIC_OR_DEPARTMENT_OTHER)
Admission: RE | Admit: 2021-04-11 | Discharge: 2021-04-11 | Disposition: A | Discharge status: Home / Self Care | Payer: Medicare Other | Source: Ambulatory Visit | Attending: Cardiovascular Disease | Admitting: Cardiovascular Disease

## 2021-04-11 ENCOUNTER — Other Ambulatory Visit: Payer: Self-pay | Admitting: Cardiovascular Disease

## 2021-04-11 ENCOUNTER — Other Ambulatory Visit: Payer: Self-pay

## 2021-04-11 ENCOUNTER — Other Ambulatory Visit (HOSPITAL_COMMUNITY): Payer: Self-pay | Admitting: Cardiovascular Disease

## 2021-04-11 DIAGNOSIS — I2510 Atherosclerotic heart disease of native coronary artery without angina pectoris: Secondary | ICD-10-CM | POA: Diagnosis not present

## 2021-04-11 DIAGNOSIS — G4733 Obstructive sleep apnea (adult) (pediatric): Secondary | ICD-10-CM | POA: Diagnosis present

## 2021-04-11 DIAGNOSIS — R0989 Other specified symptoms and signs involving the circulatory and respiratory systems: Secondary | ICD-10-CM | POA: Insufficient documentation

## 2021-04-11 DIAGNOSIS — I10 Essential (primary) hypertension: Secondary | ICD-10-CM | POA: Insufficient documentation

## 2021-04-11 DIAGNOSIS — I701 Atherosclerosis of renal artery: Secondary | ICD-10-CM

## 2021-04-11 DIAGNOSIS — E782 Mixed hyperlipidemia: Secondary | ICD-10-CM | POA: Diagnosis present

## 2021-05-02 ENCOUNTER — Other Ambulatory Visit: Payer: Self-pay | Admitting: Cardiovascular Disease

## 2021-05-09 ENCOUNTER — Telehealth: Payer: Self-pay | Admitting: Cardiovascular Disease

## 2021-05-09 NOTE — Telephone Encounter (Signed)
Spoke with patient who reports having left arm pain on occasion for the past 2-3 weeks. He denies chest pain. BP earlier today 165/75 and now 130/60. He does not want to go to the ED, but voiced understanding that if the pain increases or if he has chest pain the ntg doesn't relieve, he will go to the ED. He is willing to have a stress test. Do you want to double book to see pt?

## 2021-05-09 NOTE — Telephone Encounter (Signed)
° °  Pt said, he's been experiencing pain on his left arm again, similar experienced he had in 2018 when Dr. [name] knew he might need a new stent, he said it is not urgent, he doesn't want to go to ED. He said if Dr. [name] wants him to get a stress test to check him out he will do that

## 2021-05-10 ENCOUNTER — Ambulatory Visit (INDEPENDENT_AMBULATORY_CARE_PROVIDER_SITE_OTHER): Payer: Medicare Other | Admitting: Cardiovascular Disease

## 2021-05-10 ENCOUNTER — Other Ambulatory Visit: Payer: Self-pay

## 2021-05-10 ENCOUNTER — Encounter: Payer: Self-pay | Admitting: Cardiovascular Disease

## 2021-05-10 DIAGNOSIS — I25119 Atherosclerotic heart disease of native coronary artery with unspecified angina pectoris: Secondary | ICD-10-CM

## 2021-05-10 MED ORDER — SODIUM CHLORIDE 0.9% FLUSH: 3.0000 mL | Freq: Two times a day (BID) | INTRAVENOUS | Status: DC

## 2021-05-10 NOTE — Telephone Encounter (Signed)
Spoke with pt regarding issues with arm pain that somewhat resembles anginal pain that he experienced before second LAD stent was placed in 2019. Per Dr. [name], would like to get pt in as soon as possible. Pt able to make appointment for today. Pt verbalizes understanding.

## 2021-05-10 NOTE — Patient Instructions (Signed)
Medication Instructions:  Your physician recommends that you continue on your current medications as directed. Please refer to the Current Medication list given to you today.  *If you need a refill on your cardiac medications before your next appointment, please call your pharmacy*   Lab Work: Your physician recommends that you have labs drawn today: BMET and CBC  If you have labs (blood work) drawn today and your tests are completely normal, you will receive your results only by: [place] (if you have MyChart) OR A paper copy in the mail If you have any lab test that is abnormal or we need to change your treatment, we will call you to review the results.   Follow-Up: At [facility], you and your health needs are our priority.  As part of our continuing mission to provide you with exceptional heart care, we have created designated Provider Care Teams.  These Care Teams include your primary Cardiologist (physician) and Advanced Practice Providers (APPs -  Physician Assistants and Nurse Practitioners) who all work together to provide you with the care you need, when you need it.  We recommend signing up for the patient portal called "MyChart".  Sign up information is provided on this After Visit Summary.  MyChart is used to connect with patients for Virtual Visits (Telemedicine).  Patients are able to view lab/test results, encounter notes, upcoming appointments, etc.  Non-urgent messages can be sent to your provider as well.   To learn more about what you can do with MyChart, go to [email or web address].    Your next appointment:   2-3 week(s)  The format for your next appointment:   In Person  Provider:   Quay Burow, MD   Other Instructions  [place] [place] [address] Dept: [phone] Loc: [phone]  DIONIS AUTRY  05/10/2021  You are scheduled for a  Cardiac Catheterization on Thursday, February 16 with Dr. Quay Burow.  1. Please arrive at the [facility] (Main Entrance A) at [facility]: [address] [place], [place] [address] at 11:00 AM (This time is two hours before your procedure to ensure your preparation). Free valet parking service is available.   Special note: Every effort is made to have your procedure done on time. Please understand that emergencies sometimes delay scheduled procedures.  2. Diet: Do not eat solid foods after midnight.  The patient may have clear liquids until 5am upon the day of the procedure.  3. Labs: You will need to have blood drawn today.  4. Medication instructions in preparation for your procedure:    Do not take Diabetes Med Glucophage (Metformin) on the day of the procedure and HOLD 48 HOURS AFTER THE PROCEDURE.  On the morning of your procedure, take your Aspirin and Plavix/Clopidogrel and any morning medicines NOT listed above.  You may use sips of water.  5. Plan for one night stay--bring personal belongings. 6. Bring a current list of your medications and current insurance cards. 7. You MUST have a responsible person to drive you home. 8. Someone MUST be with you the first 24 hours after you arrive home or your discharge will be delayed. 9. Please wear clothes that are easy to get on and off and wear slip-on shoes.  Thank you for allowing [place] to care for you!   -- [facility]

## 2021-05-10 NOTE — Assessment & Plan Note (Signed)
History of CAD status post non-STEMI, PCI and stenting of his proximal LAD 07/02/2007.  He also had a 60% right renal artery stenosis at that time angiographically.  He had done well until January 2019 when he had a Myoview performed 04/04/2017 that showed ischemia in the LAD territory.  Cath performed 04/11/2017 revealed 99% "in-stent restenosis within the LAD stent.  The remainder of his coronary anatomy was free of disease.  I restented him with a 3 mm x 20 mm long Synergy drug-eluting stent postdilated to 3.23 mm.  He recently has noted some pain in his left shoulder and bicep similar to his prior symptoms.  Based on this I decided to proceed with outpatient radial diagnostic coronary angiography.  If he has restenosis within the previously stented segment he will need CABG with a LIMA to his LAD.

## 2021-05-10 NOTE — H&P (View-Only) (Signed)
05/10/2021 Justin Carr   11-14-1945  [id number]  Primary Physician Justin Frees, MD Primary Cardiologist: Justin Harp MD Justin Carr, [place]  HPI:  Justin Carr is a 76 y.o.  mildly overweight married Caucasian male, father of 2 children (1 living), who I  I last saw in the office 03/29/2021. 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 His problems include obstructive sleep apnea, on CPAP, hypertension, and hyperlipidemia. He denies chest pain or shortness of breath. He had a non-ST-segment-elevation myocardial infarction, July 02, 2007. He underwent PCI and stenting of his proximal LAD with a Taxus Liberte drug-eluting stent. He had also had a 60% right renal artery stenosis documented angiographically at that time, which we have been following by duplex ultrasound. Since I saw him back a year ago he denies chest pain or shortness of breath but has noticed some left upper extremity discomfort with exertion similar to his pre-MI symptoms. Marland Kitchen    He  underwent Myoview stress testing 04/04/17 that showed ischemia in the LAD territory. I performed radial diagnostic cath on him 04/11/17 revealed a 99% "in-stent restenosis" within the proximal LAD stent. Remainder of his coronary anatomy was free of significant disease in his LV function was normal. I restarted him with a 3 mm x 20 mm long synergy drug-eluting stent postdilated to 3.23 mm resulting reduction a 9% stenosis to 0% residual. He has done well since.   He underwent blepharoplasty last month successfully.  Over the last several weeks has noticed some discomfort in his left bicep and has occurred both at rest and with exertion.  These symptoms are similar to his preintervention symptoms 4 years ago.  Current Meds  Medication Sig   Ascorbic Acid (VITAMIN C) 500 MG CAPS See admin instructions.   aspirin 81 MG tablet Take 1 tablet  (81 mg total) by mouth daily. (Patient taking differently: Take 81 mg by mouth at bedtime.)   Cholecalciferol (VITAMIN D) 50 MCG (2000 UT) tablet 1 tablet   clopidogrel (PLAVIX) 75 MG tablet Take 1 tablet by mouth once daily   Coenzyme Q10 (COQ-10 PO) Take 1 capsule by mouth at bedtime.    dapagliflozin propanediol (FARXIGA) 5 MG TABS tablet Take 5 mg by mouth daily.   famotidine (PEPCID) 20 MG tablet Take 1 tablet (20 mg total) by mouth 2 (two) times daily.   metFORMIN (GLUCOPHAGE) 500 MG tablet Take 500 mg by mouth 2 (two) times daily.   Multiple Vitamin (MULTIVITAMIN WITH MINERALS) TABS tablet Take 1 tablet by mouth daily.   Multiple Vitamins-Minerals (PRESERVISION AREDS PO) Take 1 capsule by mouth 2 (two) times daily.   nitroGLYCERIN (NITROSTAT) 0.4 MG SL tablet DISSOLVE ONE TABLET UNDER THE TONGUE EVERY 5 MINUTES AS NEEDED FOR CHEST PAIN.  DO NOT EXCEED A TOTAL OF 3 DOSES IN 15 MINUTES   pregabalin (LYRICA) 75 MG capsule Take 100 mg by mouth 2 (two) times daily.   ramipril (ALTACE) 10 MG capsule Take 1 capsule by mouth once daily   rosuvastatin (CRESTOR) 20 MG tablet TAKE 1 TABLET BY MOUTH THREE TIMES A WEEK   sildenafil (REVATIO) 20 MG tablet Take 20-100 mg by mouth as needed (for erectile disfunction).    TRULICITY 0.38 UE/2.8MK SOPN Inject into the skin once a week.     Allergies  Allergen Reactions   Peanuts [Peanut Oil] Anaphylaxis   Lipitor [  Atorvastatin] Other (See Comments)    Muscle cramps   Rosuvastatin Other (See Comments)    Muscle cramps if taken daily-pt still takes drug, but takes 3 times a week.    Social History   Socioeconomic History   Marital status: Married    Spouse name: Not on file   Number of children: 2   Years of education: College +   Highest education level: Not on file  Occupational History   Occupation: Retired  Tobacco Use   Smoking status: Former    Packs/day: 1.00    Years: 34.00    Pack years: 34.00    Types: Cigarettes    Quit date:  07/03/2007    Years since quitting: 13.8   Smokeless tobacco: Never  Vaping Use   Vaping Use: Never used  Substance and Sexual Activity   Alcohol use: Yes    Comment: 04/11/2017 "might have a beer q other week"   Drug use: No   Sexual activity: Not Currently  Other Topics Concern   Not on file  Social History Narrative   1 cup coffee and 2 sodas per day.   Lives at home with his wife.   Right-handed.   Social Determinants of Health   Financial Resource Strain: Not on file  Food Insecurity: Not on file  Transportation Needs: Not on file  Physical Activity: Not on file  Stress: Not on file  Social Connections: Not on file  Intimate Partner Violence: Not on file     Review of Systems: General: negative for chills, fever, night sweats or weight changes.  Cardiovascular: negative for chest pain, dyspnea on exertion, edema, orthopnea, palpitations, paroxysmal nocturnal dyspnea or shortness of breath Dermatological: negative for rash Respiratory: negative for cough or wheezing Urologic: negative for hematuria Abdominal: negative for nausea, vomiting, diarrhea, bright red blood per rectum, melena, or hematemesis Neurologic: negative for visual changes, syncope, or dizziness All other systems reviewed and are otherwise negative except as noted above.    Blood pressure 116/62, pulse 63, height 5\' 11"  (1.803 m), weight 218 lb (98.9 kg), SpO2 97 %.  General appearance: alert and no distress Neck: no adenopathy, no carotid bruit, no JVD, supple, symmetrical, trachea midline, and thyroid not enlarged, symmetric, no tenderness/mass/nodules Lungs: clear to auscultation bilaterally Heart: regular rate and rhythm, S1, S2 normal, no murmur, click, rub or gallop Extremities: extremities normal, atraumatic, no cyanosis or edema Pulses: 2+ and symmetric Skin: Skin color, texture, turgor normal. No rashes or lesions Neurologic: Grossly normal  EKG sinus rhythm at 63 without ST or T wave  changes.  I personally reviewed this EKG.  ASSESSMENT AND PLAN:   Coronary artery disease History of CAD status post non-STEMI, PCI and stenting of his proximal LAD 07/02/2007.  He also had a 60% right renal artery stenosis at that time angiographically.  He had done well until January 2019 when he had a Myoview performed 04/04/2017 that showed ischemia in the LAD territory.  Cath performed 04/11/2017 revealed 99% "in-stent restenosis within the LAD stent.  The remainder of his coronary anatomy was free of disease.  I restented him with a 3 mm x 20 mm long Synergy drug-eluting stent postdilated to 3.23 mm.  He recently has noted some pain in his left shoulder and bicep similar to his prior symptoms.  Based on this I decided to proceed with outpatient radial diagnostic coronary angiography.  If he has restenosis within the previously stented segment he will need CABG with a LIMA  to his LAD.     Justin Harp MD FACP,FACC,FAHA, [facility] 05/10/2021 11:23 AM

## 2021-05-10 NOTE — Progress Notes (Signed)
05/10/2021 Justin Carr   [birth year]  [id number]  Primary Physician Justin Frees, MD Primary Cardiologist: Justin Harp MD Justin Carr, [place]  HPI:  Justin Carr is a 76 y.o.  mildly overweight married Caucasian male, father of 2 children (1 living), who I  I last saw in the office 03/29/2021. 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 His problems include obstructive sleep apnea, on CPAP, hypertension, and hyperlipidemia. He denies chest pain or shortness of breath. He had a non-ST-segment-elevation myocardial infarction, July 02, 2007. He underwent PCI and stenting of his proximal LAD with a Taxus Liberte drug-eluting stent. He had also had a 60% right renal artery stenosis documented angiographically at that time, which we have been following by duplex ultrasound. Since I saw him back a year ago he denies chest pain or shortness of breath but has noticed some left upper extremity discomfort with exertion similar to his pre-MI symptoms. Marland Kitchen    He  underwent Myoview stress testing 04/04/17 that showed ischemia in the LAD territory. I performed radial diagnostic cath on him 04/11/17 revealed a 99% "in-stent restenosis" within the proximal LAD stent. Remainder of his coronary anatomy was free of significant disease in his LV function was normal. I restarted him with a 3 mm x 20 mm long synergy drug-eluting stent postdilated to 3.23 mm resulting reduction a 9% stenosis to 0% residual. He has done well since.   He underwent blepharoplasty last month successfully.  Over the last several weeks has noticed some discomfort in his left bicep and has occurred both at rest and with exertion.  These symptoms are similar to his preintervention symptoms 4 years ago.  Current Meds  Medication Sig   Ascorbic Acid (VITAMIN C) 500 MG CAPS See admin instructions.   aspirin 81 MG tablet Take 1 tablet  (81 mg total) by mouth daily. (Patient taking differently: Take 81 mg by mouth at bedtime.)   Cholecalciferol (VITAMIN D) 50 MCG (2000 UT) tablet 1 tablet   clopidogrel (PLAVIX) 75 MG tablet Take 1 tablet by mouth once daily   Coenzyme Q10 (COQ-10 PO) Take 1 capsule by mouth at bedtime.    dapagliflozin propanediol (FARXIGA) 5 MG TABS tablet Take 5 mg by mouth daily.   famotidine (PEPCID) 20 MG tablet Take 1 tablet (20 mg total) by mouth 2 (two) times daily.   metFORMIN (GLUCOPHAGE) 500 MG tablet Take 500 mg by mouth 2 (two) times daily.   Multiple Vitamin (MULTIVITAMIN WITH MINERALS) TABS tablet Take 1 tablet by mouth daily.   Multiple Vitamins-Minerals (PRESERVISION AREDS PO) Take 1 capsule by mouth 2 (two) times daily.   nitroGLYCERIN (NITROSTAT) 0.4 MG SL tablet DISSOLVE ONE TABLET UNDER THE TONGUE EVERY 5 MINUTES AS NEEDED FOR CHEST PAIN.  DO NOT EXCEED A TOTAL OF 3 DOSES IN 15 MINUTES   pregabalin (LYRICA) 75 MG capsule Take 100 mg by mouth 2 (two) times daily.   ramipril (ALTACE) 10 MG capsule Take 1 capsule by mouth once daily   rosuvastatin (CRESTOR) 20 MG tablet TAKE 1 TABLET BY MOUTH THREE TIMES A WEEK   sildenafil (REVATIO) 20 MG tablet Take 20-100 mg by mouth as needed (for erectile disfunction).    TRULICITY 3.66 YQ/0.3KV SOPN Inject into the skin once a week.     Allergies  Allergen Reactions   Peanuts [Peanut Oil] Anaphylaxis   Lipitor [  Atorvastatin] Other (See Comments)    Muscle cramps   Rosuvastatin Other (See Comments)    Muscle cramps if taken daily-pt still takes drug, but takes 3 times a week.    Social History   Socioeconomic History   Marital status: Married    Spouse name: Not on file   Number of children: 2   Years of education: College +   Highest education level: Not on file  Occupational History   Occupation: Retired  Tobacco Use   Smoking status: Former    Packs/day: 1.00    Years: 34.00    Pack years: 34.00    Types: Cigarettes    Quit date:  07/03/2007    Years since quitting: 13.8   Smokeless tobacco: Never  Vaping Use   Vaping Use: Never used  Substance and Sexual Activity   Alcohol use: Yes    Comment: 04/11/2017 "might have a beer q other week"   Drug use: No   Sexual activity: Not Currently  Other Topics Concern   Not on file  Social History Narrative   1 cup coffee and 2 sodas per day.   Lives at home with his wife.   Right-handed.   Social Determinants of Health   Financial Resource Strain: Not on file  Food Insecurity: Not on file  Transportation Needs: Not on file  Physical Activity: Not on file  Stress: Not on file  Social Connections: Not on file  Intimate Partner Violence: Not on file     Review of Systems: General: negative for chills, fever, night sweats or weight changes.  Cardiovascular: negative for chest pain, dyspnea on exertion, edema, orthopnea, palpitations, paroxysmal nocturnal dyspnea or shortness of breath Dermatological: negative for rash Respiratory: negative for cough or wheezing Urologic: negative for hematuria Abdominal: negative for nausea, vomiting, diarrhea, bright red blood per rectum, melena, or hematemesis Neurologic: negative for visual changes, syncope, or dizziness All other systems reviewed and are otherwise negative except as noted above.    Blood pressure 116/62, pulse 63, height 5\' 11"  (1.803 m), weight 218 lb (98.9 kg), SpO2 97 %.  General appearance: alert and no distress Neck: no adenopathy, no carotid bruit, no JVD, supple, symmetrical, trachea midline, and thyroid not enlarged, symmetric, no tenderness/mass/nodules Lungs: clear to auscultation bilaterally Heart: regular rate and rhythm, S1, S2 normal, no murmur, click, rub or gallop Extremities: extremities normal, atraumatic, no cyanosis or edema Pulses: 2+ and symmetric Skin: Skin color, texture, turgor normal. No rashes or lesions Neurologic: Grossly normal  EKG sinus rhythm at 63 without ST or T wave  changes.  I personally reviewed this EKG.  ASSESSMENT AND PLAN:   Coronary artery disease History of CAD status post non-STEMI, PCI and stenting of his proximal LAD 07/02/2007.  He also had a 60% right renal artery stenosis at that time angiographically.  He had done well until January 2019 when he had a Myoview performed 04/04/2017 that showed ischemia in the LAD territory.  Cath performed 04/11/2017 revealed 99% "in-stent restenosis within the LAD stent.  The remainder of his coronary anatomy was free of disease.  I restented him with a 3 mm x 20 mm long Synergy drug-eluting stent postdilated to 3.23 mm.  He recently has noted some pain in his left shoulder and bicep similar to his prior symptoms.  Based on this I decided to proceed with outpatient radial diagnostic coronary angiography.  If he has restenosis within the previously stented segment he will need CABG with a LIMA  to his LAD.     Justin Harp MD FACP,FACC,FAHA, [facility] 05/10/2021 11:23 AM

## 2021-05-11 ENCOUNTER — Encounter (HOSPITAL_COMMUNITY)
Admission: RE | Disposition: A | Discharge status: Home / Self Care | Payer: Medicare Other | Source: Home / Self Care | Attending: Cardiovascular Disease

## 2021-05-11 ENCOUNTER — Other Ambulatory Visit: Payer: Self-pay

## 2021-05-11 ENCOUNTER — Ambulatory Visit (HOSPITAL_COMMUNITY)
Admission: RE | Admit: 2021-05-11 | Discharge: 2021-05-11 | Disposition: A | Discharge status: Home / Self Care | Payer: Medicare Other | Attending: Cardiovascular Disease | Admitting: Cardiovascular Disease

## 2021-05-11 ENCOUNTER — Ambulatory Visit (HOSPITAL_BASED_OUTPATIENT_CLINIC_OR_DEPARTMENT_OTHER): Payer: Medicare Other

## 2021-05-11 DIAGNOSIS — Z87891 Personal history of nicotine dependence: Secondary | ICD-10-CM | POA: Insufficient documentation

## 2021-05-11 DIAGNOSIS — Z955 Presence of coronary angioplasty implant and graft: Secondary | ICD-10-CM | POA: Insufficient documentation

## 2021-05-11 DIAGNOSIS — I10 Essential (primary) hypertension: Secondary | ICD-10-CM | POA: Insufficient documentation

## 2021-05-11 DIAGNOSIS — Z6829 Body mass index (BMI) 29.0-29.9, adult: Secondary | ICD-10-CM | POA: Insufficient documentation

## 2021-05-11 DIAGNOSIS — I77819 Aortic ectasia, unspecified site: Secondary | ICD-10-CM | POA: Diagnosis not present

## 2021-05-11 DIAGNOSIS — E663 Overweight: Secondary | ICD-10-CM | POA: Insufficient documentation

## 2021-05-11 DIAGNOSIS — I2510 Atherosclerotic heart disease of native coronary artery without angina pectoris: Secondary | ICD-10-CM | POA: Insufficient documentation

## 2021-05-11 DIAGNOSIS — I252 Old myocardial infarction: Secondary | ICD-10-CM | POA: Diagnosis not present

## 2021-05-11 DIAGNOSIS — G4733 Obstructive sleep apnea (adult) (pediatric): Secondary | ICD-10-CM | POA: Diagnosis not present

## 2021-05-11 DIAGNOSIS — I25119 Atherosclerotic heart disease of native coronary artery with unspecified angina pectoris: Secondary | ICD-10-CM

## 2021-05-11 DIAGNOSIS — E785 Hyperlipidemia, unspecified: Secondary | ICD-10-CM | POA: Insufficient documentation

## 2021-05-11 DIAGNOSIS — I358 Other nonrheumatic aortic valve disorders: Secondary | ICD-10-CM | POA: Insufficient documentation

## 2021-05-11 HISTORY — PX: INTRAVASCULAR ULTRASOUND/IVUS: CATH118244

## 2021-05-11 HISTORY — PX: LEFT HEART CATH AND CORONARY ANGIOGRAPHY: CATH118249

## 2021-05-11 LAB — ECHOCARDIOGRAM COMPLETE
Area-P 1/2: 3.23 cm2
Height: 71 in
S' Lateral: 3.2 cm
Weight: 3392 oz

## 2021-05-11 LAB — BASIC METABOLIC PANEL
BUN/Creatinine Ratio: 17 (ref 10–24)
BUN: 17 mg/dL (ref 8–27)
CO2: 26 mmol/L (ref 20–29)
Calcium: 10.2 mg/dL (ref 8.6–10.2)
Chloride: 101 mmol/L (ref 96–106)
Creatinine, Ser: 1.01 mg/dL (ref 0.76–1.27)
Glucose: 136 mg/dL — ABNORMAL HIGH (ref 70–99)
Potassium: 5.2 mmol/L (ref 3.5–5.2)
Sodium: 141 mmol/L (ref 134–144)
eGFR: 78 mL/min/{1.73_m2} (ref >59)

## 2021-05-11 LAB — POCT ACTIVATED CLOTTING TIME
Activated Clotting Time: 383 seconds
Activated Clotting Time: 468 seconds

## 2021-05-11 LAB — CBC
Hematocrit: 46.2 % (ref 37.5–51.0)
Hemoglobin: 16.1 g/dL (ref 13.0–17.7)
MCH: 33.1 pg — ABNORMAL HIGH (ref 26.6–33.0)
MCHC: 34.8 g/dL (ref 31.5–35.7)
MCV: 95 fL (ref 79–97)
Platelets: 175 10*3/uL (ref 150–450)
RBC: 4.87 x10E6/uL (ref 4.14–5.80)
RDW: 12.6 % (ref 11.6–15.4)
WBC: 7.1 10*3/uL (ref 3.4–10.8)

## 2021-05-11 LAB — GLUCOSE, CAPILLARY: Glucose-Capillary: 113 mg/dL — ABNORMAL HIGH (ref 70–99)

## 2021-05-11 SURGERY — LEFT HEART CATH AND CORONARY ANGIOGRAPHY
Anesthesia: LOCAL

## 2021-05-11 MED ORDER — MIDAZOLAM HCL 2 MG/2ML IJ SOLN
INTRAMUSCULAR | Status: DC | PRN
  Administered 2021-05-11: 1 mg via INTRAVENOUS

## 2021-05-11 MED ORDER — VERAPAMIL HCL 2.5 MG/ML IV SOLN
INTRA_ARTERIAL | Status: DC | PRN
  Administered 2021-05-11 (×2): 5 mL via INTRA_ARTERIAL

## 2021-05-11 MED ORDER — IOHEXOL 350 MG/ML SOLN
INTRAVENOUS | Status: DC | PRN
Start: 2021-05-11 — End: 2021-05-11
  Administered 2021-05-11: 80 mL via INTRA_ARTERIAL

## 2021-05-11 MED ORDER — FENTANYL CITRATE (PF) 100 MCG/2ML IJ SOLN
INTRAMUSCULAR | Status: AC
  Filled 2021-05-11: qty 2

## 2021-05-11 MED ORDER — HEPARIN SODIUM (PORCINE) 1000 UNIT/ML IJ SOLN
INTRAMUSCULAR | Status: DC | PRN
  Administered 2021-05-11 (×2): 5000 [IU] via INTRAVENOUS

## 2021-05-11 MED ORDER — SODIUM CHLORIDE 0.9 % WEIGHT BASED INFUSION
3.0000 mL/kg/h | INTRAVENOUS | Status: AC
  Administered 2021-05-11: 3 mL/kg/h via INTRAVENOUS

## 2021-05-11 MED ORDER — VERAPAMIL HCL 2.5 MG/ML IV SOLN
INTRAVENOUS | Status: AC
  Filled 2021-05-11: qty 2

## 2021-05-11 MED ORDER — NITROGLYCERIN 1 MG/10 ML FOR IR/CATH LAB
INTRA_ARTERIAL | Status: AC
  Filled 2021-05-11: qty 10

## 2021-05-11 MED ORDER — HEPARIN (PORCINE) IN NACL 1000-0.9 UT/500ML-% IV SOLN
INTRAVENOUS | Status: DC | PRN
  Administered 2021-05-11 (×2): 500 mL

## 2021-05-11 MED ORDER — LIDOCAINE HCL (PF) 1 % IJ SOLN
INTRAMUSCULAR | Status: AC
  Filled 2021-05-11: qty 30

## 2021-05-11 MED ORDER — SODIUM CHLORIDE 0.9% FLUSH: 3.0000 mL | INTRAVENOUS | Status: DC | PRN

## 2021-05-11 MED ORDER — LIDOCAINE HCL (PF) 1 % IJ SOLN
INTRAMUSCULAR | Status: DC | PRN
Start: 2021-05-11 — End: 2021-05-11
  Administered 2021-05-11: 2 mL

## 2021-05-11 MED ORDER — SODIUM CHLORIDE 0.9 % WEIGHT BASED INFUSION: 1.0000 mL/kg/h | INTRAVENOUS | Status: DC

## 2021-05-11 MED ORDER — FENTANYL CITRATE (PF) 100 MCG/2ML IJ SOLN
INTRAMUSCULAR | Status: DC | PRN
  Administered 2021-05-11: 25 ug via INTRAVENOUS

## 2021-05-11 MED ORDER — MIDAZOLAM HCL 2 MG/2ML IJ SOLN
INTRAMUSCULAR | Status: AC
  Filled 2021-05-11: qty 2

## 2021-05-11 MED ORDER — HEPARIN (PORCINE) IN NACL 1000-0.9 UT/500ML-% IV SOLN
INTRAVENOUS | Status: AC
  Filled 2021-05-11: qty 1000

## 2021-05-11 MED ORDER — ASPIRIN 81 MG PO CHEW
81.0000 mg | CHEWABLE_TABLET | ORAL | Status: AC
  Administered 2021-05-11: 81 mg via ORAL
  Filled 2021-05-11: qty 1

## 2021-05-11 MED ORDER — HEPARIN SODIUM (PORCINE) 1000 UNIT/ML IJ SOLN
INTRAMUSCULAR | Status: AC
  Filled 2021-05-11: qty 10

## 2021-05-11 MED ORDER — SODIUM CHLORIDE 0.9 % IV SOLN: 250.0000 mL | INTRAVENOUS | Status: DC | PRN

## 2021-05-11 SURGICAL SUPPLY — 15 items
CATH INFINITI JR4 5F (CATHETERS) ×1 IMPLANT
CATH OPTICROSS HD (CATHETERS) ×1 IMPLANT
CATH OPTITORQUE TIG 4.0 5F (CATHETERS) ×1 IMPLANT
CATH VISTA GUIDE 6FR XBLAD3.0 (CATHETERS) ×1 IMPLANT
DEVICE RAD COMP TR BAND LRG (VASCULAR PRODUCTS) ×1 IMPLANT
GLIDESHEATH SLEND A-KIT 6F 22G (SHEATH) ×1 IMPLANT
GUIDEWIRE INQWIRE 1.5J.035X260 (WIRE) IMPLANT
INQWIRE 1.5J .035X260CM (WIRE) ×2
KIT HEART LEFT (KITS) ×3 IMPLANT
PACK CARDIAC CATHETERIZATION (CUSTOM PROCEDURE TRAY) ×3 IMPLANT
SLED PULL BACK IVUS (MISCELLANEOUS) ×1 IMPLANT
TRANSDUCER W/STOPCOCK (MISCELLANEOUS) ×3 IMPLANT
TUBING CIL FLEX 10 FLL-RA (TUBING) ×3 IMPLANT
WIRE ASAHI PROWATER 180CM (WIRE) ×1 IMPLANT
WIRE HI TORQ VERSACORE-J 145CM (WIRE) ×1 IMPLANT

## 2021-05-11 NOTE — Progress Notes (Incomplete)
°  Echocardiogram 2D Echocardiogram has been performed.  Darlina Sicilian M 05/11/2021, 3:27 PM

## 2021-05-11 NOTE — Progress Notes (Signed)
[address]       [place] [address]             [phone]        Guinn A Takacs [facility] Medical Record #[id number] Date of Birth: Apr 02, 1945  Referring: [name], MD Primary Care: Shirline Frees, MD Primary Cardiologist:Jonathan [name], MD  Chief Complaint:    Chief Complaint  Patient presents with   Coronary Artery Disease    Initial surgical consult, Cath 2/16, ECHO 2/16    History of Present Illness:     76 year old male with history of coronary artery disease recently underwent a left heart cath which showed left main, and proximal RCA disease.  He denies any chest pain or shortness of breath, but has been experiencing some left arm paresthesias which is consistent with his previous symptomatology for his coronary artery disease.   Past Medical and Surgical History: Previous Chest Surgery: No Previous Chest Radiation: No Diabetes Mellitus: Yes.  HbA1C pending Creatinine: 1.01  Past Medical History:  Diagnosis Date   Coronary artery disease    GERD (gastroesophageal reflux disease)    Hyperlipidemia    Hypertension    NSTEMI (non-ST elevated myocardial infarction) ([facility]) 06/2007   [name] 07/27/2010   OSA on CPAP    Palpitations    Pneumonia ~ 1971 X 1   Type II diabetes mellitus ([facility])     Past Surgical History:  Procedure Laterality Date   CARDIOVASCULAR STRESS TEST  10/06/2007   No scintigraphic evidence of inducible myocardial ischemia. ECG positive for ischemia. Pharmacologically induced ST segment depression. Perfusion defect seen in inferior myocardial region consistent with diaphragmatic attenuation.   CORONARY ANGIOPLASTY WITH STENT PLACEMENT  07/03/2007   LAD 90% ulcerated plaque proximal between 1st and 2nd branches stented with a 2.75x42mm Taxus Liberte stent deployed at 16atm. Postdilated at 16atm (3.5mm). resulting in reduction of 90% lesion to 0% residual with TIMI3 flow. Impingement on theostium of diag branch  dilatedat 6atm resulting in reduction of 90% ostial D2 to <50% residual.   CORONARY ANGIOPLASTY WITH STENT PLACEMENT  04/11/2017   INGUINAL HERNIA REPAIR Right 01/06/2018   Procedure: OPEN RIGHT INGUINAL HERNIA REPAIR WITH MESH;  Surgeon: Armandina Gemma, MD;  Location: [facility];  Service: General;  Laterality: Right;   INSERTION OF MESH Right 01/06/2018   Procedure: INSERTION OF MESH;  Surgeon: Armandina Gemma, MD;  Location: [facility];  Service: General;  Laterality: Right;   INTRAVASCULAR ULTRASOUND/IVUS N/A 05/11/2021   Procedure: Intravascular Ultrasound/IVUS;  Surgeon: [name], MD;  Location: [facility];  Service: Cardiovascular;  Laterality: N/A;   LAPAROSCOPIC APPENDECTOMY N/A 04/01/2018   Procedure: APPENDECTOMY LAPAROSCOPIC;  Surgeon: Armandina Gemma, MD;  [facility];  Service: General;  Laterality: N/A;   LEFT HEART CATH AND CORONARY ANGIOGRAPHY N/A 04/11/2017   Procedure: LEFT HEART CATH AND CORONARY ANGIOGRAPHY;  Surgeon: [name], MD;  Location: [facility];  Service: Cardiovascular;  Laterality: N/A;   LEFT HEART CATH AND CORONARY ANGIOGRAPHY N/A 05/11/2021   Procedure: LEFT HEART CATH AND CORONARY ANGIOGRAPHY;  Surgeon: [name], MD;  Location: [facility];  Service: Cardiovascular;  Laterality: N/A;   LIGAMENT REPAIR Right 03/2001   wrist   RENAL ARTERIAL DOPPLER  8/92/1194   SMA and Cephalic artery >17% diameter reduction, Rt prox Renal artery 60-99% diameter reduction, Lft prox Renal artery 1-59% diameter reduction, kidneys are normal in size.   SHOULDER  ARTHROSCOPY WITH SUBACROMIAL DECOMPRESSION Right 04/03/2013   Procedure: RIGHT SHOULDER ARTHROSCOPY WITH SUBACROMIAL DECOMPRESSION/DISTAL CLAVICLE RESECTION;  Surgeon: Marin Shutter, MD;  Location: [facility];  Service: Orthopedics;  Laterality: Right;   TONSILLECTOMY AND ADENOIDECTOMY  ~ 1952   TRANSTHORACIC ECHOCARDIOGRAM  10/06/2007   EF 63%, normal.   WISDOM TOOTH EXTRACTION     age 71     Social History: Support: Lives with his wife.  Retired Regulatory affairs officer.  Social History   Tobacco Use  Smoking Status Former   Packs/day: 1.00   Years: 34.00   Pack years: 34.00   Types: Cigarettes   Quit date: 07/03/2007   Years since quitting: 13.8  Smokeless Tobacco Never    Social History   Substance and Sexual Activity  Alcohol Use Yes   Comment: 04/11/2017 "might have a beer q other week"     Allergies  Allergen Reactions   Peanuts [Peanut Oil] Anaphylaxis   Lipitor [Atorvastatin] Other (See Comments)    Muscle cramps   Rosuvastatin Other (See Comments)    Muscle cramps if taken daily-pt still takes drug, but takes 3 times a week.    Medications: Asprin: Yes Statin: Yes Beta Blocker: No Ace Inhibitor: Ramipril Anti-Coagulation: Plavix, but last dose was 05/11/2021  Current Outpatient Medications  Medication Sig Dispense Refill   Alpha-Lipoic Acid 300 MG CAPS Take 300 mg by mouth in the morning and at bedtime.     Ascorbic Acid (VITAMIN C) 500 MG CAPS Take 500 mg by mouth daily.     aspirin 81 MG tablet Take 1 tablet (81 mg total) by mouth daily. (Patient taking differently: Take 81 mg by mouth at bedtime.) 90 tablet 3   Cholecalciferol (VITAMIN D) 50 MCG (2000 UT) tablet Take 2,000 Units by mouth daily.     Coenzyme Q10 (COQ-10 PO) Take 1 capsule by mouth at bedtime.      dapagliflozin propanediol (FARXIGA) 5 MG TABS tablet Take 5 mg by mouth daily.     famotidine (PEPCID) 20 MG tablet Take 1 tablet (20 mg total) by mouth 2 (two) times daily. 60 tablet 6   metFORMIN (GLUCOPHAGE) 500 MG tablet Take 500 mg by mouth 2 (two) times daily.     Multiple Vitamin (MULTIVITAMIN WITH MINERALS) TABS tablet Take 1 tablet by mouth daily.     Multiple Vitamins-Minerals (PRESERVISION AREDS PO) Take 1 capsule by mouth 2 (two) times daily.     nitroGLYCERIN (NITROSTAT) 0.4 MG SL tablet DISSOLVE ONE TABLET UNDER THE TONGUE EVERY 5 MINUTES AS NEEDED FOR CHEST PAIN.  DO NOT EXCEED A  TOTAL OF 3 DOSES IN 15 MINUTES 25 tablet 1   pregabalin (LYRICA) 100 MG capsule Take 100 mg by mouth 2 (two) times daily.     ramipril (ALTACE) 10 MG capsule Take 1 capsule by mouth once daily (Patient taking differently: Take 10 mg by mouth every evening.) 90 capsule 3   rosuvastatin (CRESTOR) 20 MG tablet TAKE 1 TABLET BY MOUTH THREE TIMES A WEEK 36 tablet 0   sildenafil (VIAGRA) 100 MG tablet Take 100 mg by mouth daily as needed for erectile dysfunction.     TRULICITY 9.48 NI/6.2VO SOPN Inject 0.75 mg into the skin every Monday.     No current facility-administered medications for this visit.    (Not in a hospital admission)   Family History  Problem Relation Age of Onset   Heart attack Maternal Grandfather    Parkinson's disease Paternal Grandfather    Heart disease  Mother    Arrhythmia Father    Dementia Father      Review of Systems:   Review of Systems  Constitutional:  Negative for fever, malaise/fatigue and weight loss.  Respiratory:  Negative for shortness of breath.   Cardiovascular:  Negative for chest pain, palpitations, orthopnea and leg swelling.  Neurological:  Positive for tremors and sensory change.     Physical Exam: BP 130/68 (BP Location: Left Arm, Patient Position: Sitting)    Pulse 68    Resp 20    Ht 5\' 11"  (1.803 m)    Wt 212 lb (96.2 kg)    SpO2 94% Comment: RA   BMI 29.57 kg/m  Physical Exam Constitutional:      General: He is not in acute distress.    Appearance: Normal appearance. He is not ill-appearing.  Cardiovascular:     Rate and Rhythm: Normal rate.     Pulses: Normal pulses.     Heart sounds: No murmur heard. Pulmonary:     Effort: Pulmonary effort is normal. No respiratory distress.  Abdominal:     General: There is no distension.  Musculoskeletal:        General: Normal range of motion.     Cervical back: Normal range of motion.  Skin:    General: Skin is warm and dry.  Neurological:     General: No focal deficit present.      Mental Status: He is alert and oriented to person, place, and time.      Diagnostic Studies & Laboratory data:    Left Heart Catherization:  Intervention  Echo: IMPRESSIONS     1. Left ventricular ejection fraction, by estimation, is 60 to 65%. The  left ventricle has normal function. The left ventricle has no regional  wall motion abnormalities. Left ventricular diastolic parameters are  consistent with Grade I diastolic  dysfunction (impaired relaxation).   2. Right ventricular systolic function is normal. The right ventricular  size is normal.   3. The mitral valve is grossly normal. Trivial mitral valve  regurgitation.   4. The aortic valve is tricuspid. There is mild calcification of the  aortic valve. There is mild thickening of the aortic valve. Aortic valve  regurgitation is not visualized. Aortic valve sclerosis/calcification is  present, without any evidence of  aortic stenosis.   5. Aortic dilatation noted. There is borderline dilatation of the  ascending aorta, measuring 36 mm.   6. The inferior vena cava is normal in size with greater than 50%  respiratory variability, suggesting right atrial pressure of 3 mmHg.   EKG: Sinus with first-degree AV block I have independently reviewed the above radiologic studies and discussed with the patient   Recent Lab Findings: Lab Results  Component Value Date   WBC 7.1 05/10/2021   HGB 16.1 05/10/2021   HCT 46.2 05/10/2021   PLT 175 05/10/2021   GLUCOSE 136 (H) 05/10/2021   CHOL 113 06/21/2016   TRIG 151 (H) 06/21/2016   HDL 35 (L) 06/21/2016   LDLCALC 48 06/21/2016   ALT 27 09/04/2019   AST 23 09/04/2019   NA 141 05/10/2021   K 5.2 05/10/2021   CL 101 05/10/2021   CREATININE 1.01 05/10/2021   BUN 17 05/10/2021   CO2 26 05/10/2021   TSH 1.960 04/05/2017   INR 1.0 04/05/2017   HGBA1C 6.5 (H) 03/17/2018      Assessment / Plan:   76 year old male with left main, and proximal RCA disease.  Personally  reviewed his left heart cath.  He has good targets in his LAD, circumflex and his RCA territories.  His echocardiogram shows preserved biventricular function and no significant valvular disease.  We discussed the risk and benefits of surgical revascularization and he is agreeable to proceed.  He is tentatively scheduled for May 17, 2021.     I  spent 40 minutes counseling the patient face to face.   Lajuana Matte 05/12/2021 3:38 PM

## 2021-05-11 NOTE — Interval H&P Note (Signed)
Cath Lab Visit (complete for each Cath Lab visit)  Clinical Evaluation Leading to the Procedure:   ACS: No.  Non-ACS:    Anginal Classification: CCS I  Anti-ischemic medical therapy: No Therapy  Non-Invasive Test Results: No non-invasive testing performed  Prior CABG: No previous CABG      History and Physical Interval Note:  05/11/2021 12:31 PM  [name]  has presented today for surgery, with the diagnosis of angina.  The various methods of treatment have been discussed with the patient and family. After consideration of risks, benefits and other options for treatment, the patient has consented to  Procedure(s): LEFT HEART CATH AND CORONARY ANGIOGRAPHY (N/A) as a surgical intervention.  The patient's history has been reviewed, patient examined, no change in status, stable for surgery.  I have reviewed the patient's chart and labs.  Questions were answered to the patient's satisfaction.     Justin Carr

## 2021-05-11 NOTE — H&P (View-Only) (Signed)
[address]       [place] [address]             [phone]        Kaveh A Brousseau [facility] Medical Record #[id number] Date of Birth: 06/22/1945  Referring: [name], MD Primary Care: Shirline Frees, MD Primary Cardiologist:Jonathan [name], MD  Chief Complaint:    Chief Complaint  Patient presents with   Coronary Artery Disease    Initial surgical consult, Cath 2/16, ECHO 2/16    History of Present Illness:     76 year old male with history of coronary artery disease recently underwent a left heart cath which showed left main, and proximal RCA disease.  He denies any chest pain or shortness of breath, but has been experiencing some left arm paresthesias which is consistent with his previous symptomatology for his coronary artery disease.   Past Medical and Surgical History: Previous Chest Surgery: No Previous Chest Radiation: No Diabetes Mellitus: Yes.  HbA1C pending Creatinine: 1.01  Past Medical History:  Diagnosis Date   Coronary artery disease    GERD (gastroesophageal reflux disease)    Hyperlipidemia    Hypertension    NSTEMI (non-ST elevated myocardial infarction) ([facility]) 06/2007   [name] 07/27/2010   OSA on CPAP    Palpitations    Pneumonia ~ 1971 X 1   Type II diabetes mellitus ([facility])     Past Surgical History:  Procedure Laterality Date   CARDIOVASCULAR STRESS TEST  10/06/2007   No scintigraphic evidence of inducible myocardial ischemia. ECG positive for ischemia. Pharmacologically induced ST segment depression. Perfusion defect seen in inferior myocardial region consistent with diaphragmatic attenuation.   CORONARY ANGIOPLASTY WITH STENT PLACEMENT  07/03/2007   LAD 90% ulcerated plaque proximal between 1st and 2nd branches stented with a 2.75x34mm Taxus Liberte stent deployed at 16atm. Postdilated at 16atm (3.52mm). resulting in reduction of 90% lesion to 0% residual with TIMI3 flow. Impingement on theostium of diag branch  dilatedat 6atm resulting in reduction of 90% ostial D2 to <50% residual.   CORONARY ANGIOPLASTY WITH STENT PLACEMENT  04/11/2017   INGUINAL HERNIA REPAIR Right 01/06/2018   Procedure: OPEN RIGHT INGUINAL HERNIA REPAIR WITH MESH;  Surgeon: Armandina Gemma, MD;  Location: [facility];  Service: General;  Laterality: Right;   INSERTION OF MESH Right 01/06/2018   Procedure: INSERTION OF MESH;  Surgeon: Armandina Gemma, MD;  Location: [facility];  Service: General;  Laterality: Right;   INTRAVASCULAR ULTRASOUND/IVUS N/A 05/11/2021   Procedure: Intravascular Ultrasound/IVUS;  Surgeon: [name], MD;  Location: [facility];  Service: Cardiovascular;  Laterality: N/A;   LAPAROSCOPIC APPENDECTOMY N/A 04/01/2018   Procedure: APPENDECTOMY LAPAROSCOPIC;  Surgeon: Armandina Gemma, MD;  [facility];  Service: General;  Laterality: N/A;   LEFT HEART CATH AND CORONARY ANGIOGRAPHY N/A 04/11/2017   Procedure: LEFT HEART CATH AND CORONARY ANGIOGRAPHY;  Surgeon: [name], MD;  Location: [facility];  Service: Cardiovascular;  Laterality: N/A;   LEFT HEART CATH AND CORONARY ANGIOGRAPHY N/A 05/11/2021   Procedure: LEFT HEART CATH AND CORONARY ANGIOGRAPHY;  Surgeon: [name], MD;  Location: [facility];  Service: Cardiovascular;  Laterality: N/A;   LIGAMENT REPAIR Right 03/2001   wrist   RENAL ARTERIAL DOPPLER  09/24/6376   SMA and Cephalic artery >58% diameter reduction, Rt prox Renal artery 60-99% diameter reduction, Lft prox Renal artery 1-59% diameter reduction, kidneys are normal in size.   SHOULDER  ARTHROSCOPY WITH SUBACROMIAL DECOMPRESSION Right 04/03/2013   Procedure: RIGHT SHOULDER ARTHROSCOPY WITH SUBACROMIAL DECOMPRESSION/DISTAL CLAVICLE RESECTION;  Surgeon: Marin Shutter, MD;  Location: [facility];  Service: Orthopedics;  Laterality: Right;   TONSILLECTOMY AND ADENOIDECTOMY  ~ 1952   TRANSTHORACIC ECHOCARDIOGRAM  10/06/2007   EF 63%, normal.   WISDOM TOOTH EXTRACTION     age 67     Social History: Support: Lives with his wife.  Retired Regulatory affairs officer.  Social History   Tobacco Use  Smoking Status Former   Packs/day: 1.00   Years: 34.00   Pack years: 34.00   Types: Cigarettes   Quit date: 07/03/2007   Years since quitting: 13.8  Smokeless Tobacco Never    Social History   Substance and Sexual Activity  Alcohol Use Yes   Comment: 04/11/2017 "might have a beer q other week"     Allergies  Allergen Reactions   Peanuts [Peanut Oil] Anaphylaxis   Lipitor [Atorvastatin] Other (See Comments)    Muscle cramps   Rosuvastatin Other (See Comments)    Muscle cramps if taken daily-pt still takes drug, but takes 3 times a week.    Medications: Asprin: Yes Statin: Yes Beta Blocker: No Ace Inhibitor: Ramipril Anti-Coagulation: Plavix, but last dose was 05/11/2021  Current Outpatient Medications  Medication Sig Dispense Refill   Alpha-Lipoic Acid 300 MG CAPS Take 300 mg by mouth in the morning and at bedtime.     Ascorbic Acid (VITAMIN C) 500 MG CAPS Take 500 mg by mouth daily.     aspirin 81 MG tablet Take 1 tablet (81 mg total) by mouth daily. (Patient taking differently: Take 81 mg by mouth at bedtime.) 90 tablet 3   Cholecalciferol (VITAMIN D) 50 MCG (2000 UT) tablet Take 2,000 Units by mouth daily.     Coenzyme Q10 (COQ-10 PO) Take 1 capsule by mouth at bedtime.      dapagliflozin propanediol (FARXIGA) 5 MG TABS tablet Take 5 mg by mouth daily.     famotidine (PEPCID) 20 MG tablet Take 1 tablet (20 mg total) by mouth 2 (two) times daily. 60 tablet 6   metFORMIN (GLUCOPHAGE) 500 MG tablet Take 500 mg by mouth 2 (two) times daily.     Multiple Vitamin (MULTIVITAMIN WITH MINERALS) TABS tablet Take 1 tablet by mouth daily.     Multiple Vitamins-Minerals (PRESERVISION AREDS PO) Take 1 capsule by mouth 2 (two) times daily.     nitroGLYCERIN (NITROSTAT) 0.4 MG SL tablet DISSOLVE ONE TABLET UNDER THE TONGUE EVERY 5 MINUTES AS NEEDED FOR CHEST PAIN.  DO NOT EXCEED A  TOTAL OF 3 DOSES IN 15 MINUTES 25 tablet 1   pregabalin (LYRICA) 100 MG capsule Take 100 mg by mouth 2 (two) times daily.     ramipril (ALTACE) 10 MG capsule Take 1 capsule by mouth once daily (Patient taking differently: Take 10 mg by mouth every evening.) 90 capsule 3   rosuvastatin (CRESTOR) 20 MG tablet TAKE 1 TABLET BY MOUTH THREE TIMES A WEEK 36 tablet 0   sildenafil (VIAGRA) 100 MG tablet Take 100 mg by mouth daily as needed for erectile dysfunction.     TRULICITY 8.12 XN/1.7GY SOPN Inject 0.75 mg into the skin every Monday.     No current facility-administered medications for this visit.    (Not in a hospital admission)   Family History  Problem Relation Age of Onset   Heart attack Maternal Grandfather    Parkinson's disease Paternal Grandfather    Heart disease  Mother    Arrhythmia Father    Dementia Father      Review of Systems:   Review of Systems  Constitutional:  Negative for fever, malaise/fatigue and weight loss.  Respiratory:  Negative for shortness of breath.   Cardiovascular:  Negative for chest pain, palpitations, orthopnea and leg swelling.  Neurological:  Positive for tremors and sensory change.     Physical Exam: BP 130/68 (BP Location: Left Arm, Patient Position: Sitting)    Pulse 68    Resp 20    Ht 5\' 11"  (1.803 m)    Wt 212 lb (96.2 kg)    SpO2 94% Comment: RA   BMI 29.57 kg/m  Physical Exam Constitutional:      General: He is not in acute distress.    Appearance: Normal appearance. He is not ill-appearing.  Cardiovascular:     Rate and Rhythm: Normal rate.     Pulses: Normal pulses.     Heart sounds: No murmur heard. Pulmonary:     Effort: Pulmonary effort is normal. No respiratory distress.  Abdominal:     General: There is no distension.  Musculoskeletal:        General: Normal range of motion.     Cervical back: Normal range of motion.  Skin:    General: Skin is warm and dry.  Neurological:     General: No focal deficit present.      Mental Status: He is alert and oriented to person, place, and time.      Diagnostic Studies & Laboratory data:    Left Heart Catherization:  Intervention  Echo: IMPRESSIONS     1. Left ventricular ejection fraction, by estimation, is 60 to 65%. The  left ventricle has normal function. The left ventricle has no regional  wall motion abnormalities. Left ventricular diastolic parameters are  consistent with Grade I diastolic  dysfunction (impaired relaxation).   2. Right ventricular systolic function is normal. The right ventricular  size is normal.   3. The mitral valve is grossly normal. Trivial mitral valve  regurgitation.   4. The aortic valve is tricuspid. There is mild calcification of the  aortic valve. There is mild thickening of the aortic valve. Aortic valve  regurgitation is not visualized. Aortic valve sclerosis/calcification is  present, without any evidence of  aortic stenosis.   5. Aortic dilatation noted. There is borderline dilatation of the  ascending aorta, measuring 36 mm.   6. The inferior vena cava is normal in size with greater than 50%  respiratory variability, suggesting right atrial pressure of 3 mmHg.   EKG: Sinus with first-degree AV block I have independently reviewed the above radiologic studies and discussed with the patient   Recent Lab Findings: Lab Results  Component Value Date   WBC 7.1 05/10/2021   HGB 16.1 05/10/2021   HCT 46.2 05/10/2021   PLT 175 05/10/2021   GLUCOSE 136 (H) 05/10/2021   CHOL 113 06/21/2016   TRIG 151 (H) 06/21/2016   HDL 35 (L) 06/21/2016   LDLCALC 48 06/21/2016   ALT 27 09/04/2019   AST 23 09/04/2019   NA 141 05/10/2021   K 5.2 05/10/2021   CL 101 05/10/2021   CREATININE 1.01 05/10/2021   BUN 17 05/10/2021   CO2 26 05/10/2021   TSH 1.960 04/05/2017   INR 1.0 04/05/2017   HGBA1C 6.5 (H) 03/17/2018      Assessment / Plan:   76 year old male with left main, and proximal RCA disease.  Personally  reviewed his left heart cath.  He has good targets in his LAD, circumflex and his RCA territories.  His echocardiogram shows preserved biventricular function and no significant valvular disease.  We discussed the risk and benefits of surgical revascularization and he is agreeable to proceed.  He is tentatively scheduled for May 17, 2021.     I  spent 40 minutes counseling the patient face to face.   Lajuana Matte 05/12/2021 3:38 PM

## 2021-05-11 NOTE — Progress Notes (Signed)
[facility] consulted for outpatient CABG evaluation. Pt will dc Plavix. [facility] office will call patient with an appointment.

## 2021-05-12 ENCOUNTER — Institutional Professional Consult (permissible substitution) (INDEPENDENT_AMBULATORY_CARE_PROVIDER_SITE_OTHER): Payer: Medicare Other | Admitting: Thoracic Surgery (Cardiothoracic Vascular Surgery)

## 2021-05-12 ENCOUNTER — Encounter (HOSPITAL_COMMUNITY): Payer: Self-pay | Admitting: Cardiovascular Disease

## 2021-05-12 ENCOUNTER — Other Ambulatory Visit: Payer: Self-pay | Admitting: *Deleted

## 2021-05-12 VITALS — BP 130/68 | HR 68 | Resp 20 | Ht 71.0 in | Wt 212.0 lb

## 2021-05-12 DIAGNOSIS — I2510 Atherosclerotic heart disease of native coronary artery without angina pectoris: Secondary | ICD-10-CM | POA: Diagnosis not present

## 2021-05-15 NOTE — Progress Notes (Signed)
Surgical Instructions    Your procedure is scheduled on Wednesday, February 22nd, 2023.   Report to [facility] Main Entrance "A" at 05:30 A.M., then check in with the Admitting office.  Call this number if you have problems the morning of surgery:  [phone]   If you have any questions prior to your surgery date call [phone]: Open Monday-Friday 8am-4pm    Remember:  Do not eat or drink after midnight the night before your surgery    Take these medicines the morning of surgery with A SIP OF WATER:   famotidine (PEPCID) pregabalin (LYRICA)  rosuvastatin (CRESTOR)  nitroGLYCERIN (NITROSTAT) - if needed  Follow your surgeon's instructions on when to stop Aspirin.  If no instructions were given by your surgeon then you will need to call the office to get those instructions.     As of today, STOP taking any Aspirin (unless otherwise instructed by your surgeon) Aleve, Naproxen, Ibuprofen, Motrin, Advil, Goody's, BC's, all herbal medications, fish oil, and all vitamins.   WHAT DO I DO ABOUT MY DIABETES MEDICATION?   Do not take metFORMIN (GLUCOPHAGE) the morning of surgery.  Do not take dapagliflozin propanediol (FARXIGA) the day prior surgery and the day of surgery.  Do not take TRULICITY the day of surgery.   HOW TO MANAGE YOUR DIABETES BEFORE AND AFTER SURGERY  Why is it important to control my blood sugar before and after surgery? Improving blood sugar levels before and after surgery helps healing and can limit problems. A way of improving blood sugar control is eating a healthy diet by:  Eating less sugar and carbohydrates  Increasing activity/exercise  Talking with your doctor about reaching your blood sugar goals High blood sugars (greater than 180 mg/dL) can raise your risk of infections and slow your recovery, so you will need to focus on controlling your diabetes during the weeks before surgery. Make sure that the doctor who takes care of your diabetes knows  about your planned surgery including the date and location.  How do I manage my blood sugar before surgery? Check your blood sugar at least 4 times a day, starting 2 days before surgery, to make sure that the level is not too high or low.  Check your blood sugar the morning of your surgery when you wake up and every 2 hours until you get to the Short Stay unit.  If your blood sugar is less than 70 mg/dL, you will need to treat for low blood sugar: Do not take insulin. Treat a low blood sugar (less than 70 mg/dL) with  cup of clear juice (cranberry or apple), 4 glucose tablets, OR glucose gel. Recheck blood sugar in 15 minutes after treatment (to make sure it is greater than 70 mg/dL). If your blood sugar is not greater than 70 mg/dL on recheck, call [phone] for further instructions. Report your blood sugar to the short stay nurse when you get to Short Stay.  If you are admitted to the hospital after surgery: Your blood sugar will be checked by the staff and you will probably be given insulin after surgery (instead of oral diabetes medicines) to make sure you have good blood sugar levels. The goal for blood sugar control after surgery is 80-180 mg/dL.    The day of surgery:          Do not wear jewelry  Do not wear lotions, powders, colognes, or deodorant. Men may shave face and neck. Do not bring valuables to the hospital.  [facility] is not responsible for any belongings or valuables. .   Do NOT Smoke (Tobacco/Vaping)  24 hours prior to your procedure  If you use a CPAP at night, you may bring your mask for your overnight stay.   Contacts, glasses, hearing aids, dentures or partials may not be worn into surgery, please bring cases for these belongings   For patients admitted to the hospital, discharge time will be determined by your treatment team.   Patients discharged the day of surgery will not be allowed to drive home, and someone needs to stay with them for 24  hours.  NO VISITORS WILL BE ALLOWED IN PRE-OP WHERE PATIENTS ARE PREPPED FOR SURGERY.  ONLY 1 SUPPORT PERSON MAY BE PRESENT IN THE WAITING ROOM WHILE YOU ARE IN SURGERY.  IF YOU ARE TO BE ADMITTED, ONCE YOU ARE IN YOUR ROOM YOU WILL BE ALLOWED TWO (2) VISITORS. 1 (ONE) VISITOR MAY STAY OVERNIGHT BUT MUST ARRIVE TO THE ROOM BY 8pm.  Minor children may have two parents present. Special consideration for safety and communication needs will be reviewed on a case by case basis.  Special instructions:    Oral Hygiene is also important to reduce your risk of infection.  Remember - BRUSH YOUR TEETH THE MORNING OF SURGERY WITH YOUR REGULAR TOOTHPASTE   [facility]- Preparing For Surgery  Before surgery, you can play an important role. Because skin is not sterile, your skin needs to be as free of germs as possible. You can reduce the number of germs on your skin by washing with CHG (chlorahexidine gluconate) Soap before surgery.  CHG is an antiseptic cleaner which kills germs and bonds with the skin to continue killing germs even after washing.     Please do not use if you have an allergy to CHG or antibacterial soaps. If your skin becomes reddened/irritated stop using the CHG.  Do not shave (including legs and underarms) for at least 48 hours prior to first CHG shower. It is OK to shave your face.  Please follow these instructions carefully.     Shower the NIGHT BEFORE SURGERY and the MORNING OF SURGERY with CHG Soap.   If you chose to wash your hair, wash your hair first as usual with your normal shampoo. After you shampoo, rinse your hair and body thoroughly to remove the shampoo.  Then [employer] and genitals (private parts) with your normal soap and rinse thoroughly to remove soap.  After that Use CHG Soap as you would any other liquid soap. You can apply CHG directly to the skin and wash gently with a scrungie or a clean washcloth.   Apply the CHG Soap to your body ONLY FROM THE NECK DOWN.  Do  not use on open wounds or open sores. Avoid contact with your eyes, ears, mouth and genitals (private parts). Wash Face and genitals (private parts)  with your normal soap.   Wash thoroughly, paying special attention to the area where your surgery will be performed.  Thoroughly rinse your body with warm water from the neck down.  DO NOT shower/wash with your normal soap after using and rinsing off the CHG Soap.  Pat yourself dry with a CLEAN TOWEL.  Wear CLEAN PAJAMAS to bed the night before surgery  Place CLEAN SHEETS on your bed the night before your surgery  DO NOT SLEEP WITH PETS.   Day of Surgery:  Take a shower with CHG soap. Wear Clean/Comfortable clothing the morning of surgery Do  not apply any deodorants/lotions.   Remember to brush your teeth WITH YOUR REGULAR TOOTHPASTE.    COVID testing  If you are going to stay overnight or be admitted after your procedure/surgery and require a pre-op COVID test, please follow these instructions after your COVID test   You are not required to quarantine however you are required to wear a well-fitting mask when you are out and around people not in your household.  If your mask becomes wet or soiled, replace with a new one.  Wash your hands often with soap and water for 20 seconds or clean your hands with an alcohol-based hand sanitizer that contains at least 60% alcohol.  Do not share personal items.  Notify your provider: if you are in close contact with someone who has COVID  or if you develop a fever of 100.4 or greater, sneezing, cough, sore throat, shortness of breath or body aches.    Please read over the following fact sheets that you were given.

## 2021-05-16 ENCOUNTER — Other Ambulatory Visit: Payer: Self-pay

## 2021-05-16 ENCOUNTER — Encounter (HOSPITAL_COMMUNITY)
Admission: RE | Admit: 2021-05-16 | Discharge: 2021-05-16 | Disposition: A | Discharge status: Home / Self Care | Payer: Medicare Other | Source: Ambulatory Visit | Attending: Thoracic Surgery (Cardiothoracic Vascular Surgery) | Admitting: Thoracic Surgery (Cardiothoracic Vascular Surgery)

## 2021-05-16 ENCOUNTER — Ambulatory Visit (HOSPITAL_COMMUNITY)
Admission: RE | Admit: 2021-05-16 | Discharge: 2021-05-16 | Disposition: A | Discharge status: Home / Self Care | Payer: Medicare Other | Source: Ambulatory Visit | Attending: Thoracic Surgery (Cardiothoracic Vascular Surgery) | Admitting: Thoracic Surgery (Cardiothoracic Vascular Surgery)

## 2021-05-16 ENCOUNTER — Encounter (HOSPITAL_COMMUNITY): Payer: Self-pay

## 2021-05-16 ENCOUNTER — Ambulatory Visit (HOSPITAL_BASED_OUTPATIENT_CLINIC_OR_DEPARTMENT_OTHER)
Admission: RE | Admit: 2021-05-16 | Discharge: 2021-05-16 | Disposition: A | Discharge status: Home / Self Care | Payer: Medicare Other | Source: Ambulatory Visit | Attending: Thoracic Surgery (Cardiothoracic Vascular Surgery) | Admitting: Thoracic Surgery (Cardiothoracic Vascular Surgery)

## 2021-05-16 VITALS — BP 143/87 | HR 65 | Temp 98.7°F | Resp 18 | Ht 71.0 in | Wt 212.0 lb

## 2021-05-16 DIAGNOSIS — Z01818 Encounter for other preprocedural examination: Secondary | ICD-10-CM

## 2021-05-16 DIAGNOSIS — D62 Acute posthemorrhagic anemia: Secondary | ICD-10-CM | POA: Diagnosis not present

## 2021-05-16 DIAGNOSIS — I2510 Atherosclerotic heart disease of native coronary artery without angina pectoris: Secondary | ICD-10-CM | POA: Diagnosis not present

## 2021-05-16 DIAGNOSIS — Z7982 Long term (current) use of aspirin: Secondary | ICD-10-CM | POA: Diagnosis not present

## 2021-05-16 DIAGNOSIS — Z87891 Personal history of nicotine dependence: Secondary | ICD-10-CM | POA: Diagnosis not present

## 2021-05-16 DIAGNOSIS — E1140 Type 2 diabetes mellitus with diabetic neuropathy, unspecified: Secondary | ICD-10-CM | POA: Diagnosis not present

## 2021-05-16 DIAGNOSIS — K219 Gastro-esophageal reflux disease without esophagitis: Secondary | ICD-10-CM | POA: Diagnosis not present

## 2021-05-16 DIAGNOSIS — Z20822 Contact with and (suspected) exposure to covid-19: Secondary | ICD-10-CM | POA: Diagnosis not present

## 2021-05-16 DIAGNOSIS — I252 Old myocardial infarction: Secondary | ICD-10-CM | POA: Diagnosis not present

## 2021-05-16 DIAGNOSIS — G4733 Obstructive sleep apnea (adult) (pediatric): Secondary | ICD-10-CM | POA: Diagnosis not present

## 2021-05-16 DIAGNOSIS — I10 Essential (primary) hypertension: Secondary | ICD-10-CM | POA: Diagnosis not present

## 2021-05-16 DIAGNOSIS — Z79899 Other long term (current) drug therapy: Secondary | ICD-10-CM | POA: Diagnosis not present

## 2021-05-16 DIAGNOSIS — E785 Hyperlipidemia, unspecified: Secondary | ICD-10-CM | POA: Diagnosis not present

## 2021-05-16 DIAGNOSIS — Z7984 Long term (current) use of oral hypoglycemic drugs: Secondary | ICD-10-CM | POA: Diagnosis not present

## 2021-05-16 DIAGNOSIS — Z955 Presence of coronary angioplasty implant and graft: Secondary | ICD-10-CM | POA: Diagnosis not present

## 2021-05-16 DIAGNOSIS — D6959 Other secondary thrombocytopenia: Secondary | ICD-10-CM | POA: Diagnosis not present

## 2021-05-16 LAB — APTT: aPTT: 36 seconds (ref 24–36)

## 2021-05-16 LAB — BLOOD GAS, ARTERIAL
Acid-Base Excess: 5.4 mmol/L — ABNORMAL HIGH (ref 0.0–2.0)
Bicarbonate: 29.9 mmol/L — ABNORMAL HIGH (ref 20.0–28.0)
Drawn by: 58793
FIO2: 21 %
O2 Saturation: 97.9 %
Patient temperature: 37
pCO2 arterial: 42 mmHg (ref 32–48)
pH, Arterial: 7.46 — ABNORMAL HIGH (ref 7.35–7.45)
pO2, Arterial: 84 mmHg (ref 83–108)

## 2021-05-16 LAB — SURGICAL PCR SCREEN
MRSA, PCR: NEGATIVE
Staphylococcus aureus: NEGATIVE

## 2021-05-16 LAB — COMPREHENSIVE METABOLIC PANEL
ALT: 28 U/L (ref 0–44)
AST: 18 U/L (ref 15–41)
Albumin: 4 g/dL (ref 3.5–5.0)
Alkaline Phosphatase: 59 U/L (ref 38–126)
Anion gap: 10 (ref 5–15)
BUN: 14 mg/dL (ref 8–23)
CO2: 24 mmol/L (ref 22–32)
Calcium: 9.1 mg/dL (ref 8.9–10.3)
Chloride: 101 mmol/L (ref 98–111)
Creatinine, Ser: 0.92 mg/dL (ref 0.61–1.24)
GFR, Estimated: >60 mL/min (ref >60)
Glucose, Bld: 104 mg/dL — ABNORMAL HIGH (ref 70–99)
Potassium: 4.1 mmol/L (ref 3.5–5.1)
Sodium: 135 mmol/L (ref 135–145)
Total Bilirubin: 0.8 mg/dL (ref 0.3–1.2)
Total Protein: 6.6 g/dL (ref 6.5–8.1)

## 2021-05-16 LAB — URINALYSIS, ROUTINE W REFLEX MICROSCOPIC
Bilirubin Urine: NEGATIVE
Glucose, UA: 50 mg/dL — AB
Hgb urine dipstick: NEGATIVE
Ketones, ur: NEGATIVE mg/dL
Leukocytes,Ua: NEGATIVE
Nitrite: NEGATIVE
Protein, ur: NEGATIVE mg/dL
Specific Gravity, Urine: 1.008 (ref 1.005–1.030)
pH: 6 (ref 5.0–8.0)

## 2021-05-16 LAB — CBC
HCT: 45.8 % (ref 39.0–52.0)
Hemoglobin: 16.1 g/dL (ref 13.0–17.0)
MCH: 33.5 pg (ref 26.0–34.0)
MCHC: 35.2 g/dL (ref 30.0–36.0)
MCV: 95.4 fL (ref 80.0–100.0)
Platelets: 178 10*3/uL (ref 150–400)
RBC: 4.8 MIL/uL (ref 4.22–5.81)
RDW: 12.9 % (ref 11.5–15.5)
WBC: 7.5 10*3/uL (ref 4.0–10.5)
nRBC: 0 % (ref 0.0–0.2)

## 2021-05-16 LAB — HEMOGLOBIN A1C
Hgb A1c MFr Bld: 6.1 % — ABNORMAL HIGH (ref 4.8–5.6)
Mean Plasma Glucose: 128 mg/dL

## 2021-05-16 LAB — TYPE AND SCREEN
ABO/RH(D): A POS
Antibody Screen: NEGATIVE

## 2021-05-16 LAB — PROTIME-INR
INR: 1.1 (ref 0.8–1.2)
Prothrombin Time: 13.8 seconds (ref 11.4–15.2)

## 2021-05-16 LAB — GLUCOSE, CAPILLARY: Glucose-Capillary: 106 mg/dL — ABNORMAL HIGH (ref 70–99)

## 2021-05-16 MED ORDER — TRANEXAMIC ACID (OHS) BOLUS VIA INFUSION
15.0000 mg/kg | INTRAVENOUS | Status: AC
  Administered 2021-05-17: 1443 mg via INTRAVENOUS
  Filled 2021-05-16: qty 1443

## 2021-05-16 MED ORDER — POTASSIUM CHLORIDE 2 MEQ/ML IV SOLN
80.0000 meq | INTRAVENOUS | Status: DC
  Filled 2021-05-16: qty 40

## 2021-05-16 MED ORDER — MANNITOL 20 % IV SOLN
INTRAVENOUS | Status: DC
  Filled 2021-05-16: qty 13

## 2021-05-16 MED ORDER — DEXMEDETOMIDINE HCL IN NACL 400 MCG/100ML IV SOLN
0.1000 ug/kg/h | INTRAVENOUS | Status: AC
  Administered 2021-05-17: .7 ug/kg/h via INTRAVENOUS
  Filled 2021-05-16: qty 100

## 2021-05-16 MED ORDER — NOREPINEPHRINE 4 MG/250ML-% IV SOLN
0.0000 ug/min | INTRAVENOUS | Status: DC
  Filled 2021-05-16: qty 250

## 2021-05-16 MED ORDER — HEPARIN 30,000 UNITS/1000 ML (OHS) CELLSAVER SOLUTION
Status: DC
Start: 2021-05-17 — End: 2021-05-17
  Filled 2021-05-16: qty 1000

## 2021-05-16 MED ORDER — PHENYLEPHRINE HCL-NACL 20-0.9 MG/250ML-% IV SOLN
30.0000 ug/min | INTRAVENOUS | Status: AC
  Administered 2021-05-17: 30 ug/min via INTRAVENOUS
  Filled 2021-05-16: qty 250

## 2021-05-16 MED ORDER — CEFAZOLIN SODIUM-DEXTROSE 2-4 GM/100ML-% IV SOLN
2.0000 g | INTRAVENOUS | Status: AC
  Administered 2021-05-17: 2 g via INTRAVENOUS

## 2021-05-16 MED ORDER — NITROGLYCERIN IN D5W 200-5 MCG/ML-% IV SOLN
2.0000 ug/min | INTRAVENOUS | Status: AC
  Administered 2021-05-17: 10 ug/min via INTRAVENOUS
  Filled 2021-05-16: qty 250

## 2021-05-16 MED ORDER — TRANEXAMIC ACID (OHS) PUMP PRIME SOLUTION
2.0000 mg/kg | INTRAVENOUS | Status: DC
  Filled 2021-05-16: qty 1.92

## 2021-05-16 MED ORDER — INSULIN REGULAR(HUMAN) IN NACL 100-0.9 UT/100ML-% IV SOLN
INTRAVENOUS | Status: AC
  Administered 2021-05-17: 2.8 [IU]/h via INTRAVENOUS
  Filled 2021-05-16: qty 100

## 2021-05-16 MED ORDER — EPINEPHRINE HCL 5 MG/250ML IV SOLN IN NS
0.0000 ug/min | INTRAVENOUS | Status: DC
  Filled 2021-05-16: qty 250

## 2021-05-16 MED ORDER — VANCOMYCIN HCL 1500 MG/300ML IV SOLN
1500.0000 mg | INTRAVENOUS | Status: AC
  Administered 2021-05-17: 1500 mg via INTRAVENOUS
  Filled 2021-05-16: qty 300

## 2021-05-16 MED ORDER — PLASMA-LYTE A IV SOLN
INTRAVENOUS | Status: DC
  Filled 2021-05-16: qty 2.5

## 2021-05-16 MED ORDER — MILRINONE LACTATE IN DEXTROSE 20-5 MG/100ML-% IV SOLN
0.3000 ug/kg/min | INTRAVENOUS | Status: DC
  Filled 2021-05-16: qty 100

## 2021-05-16 MED ORDER — CEFAZOLIN SODIUM-DEXTROSE 2-4 GM/100ML-% IV SOLN
2.0000 g | INTRAVENOUS | Status: AC
  Administered 2021-05-17: 2 g via INTRAVENOUS
  Filled 2021-05-16 (×2): qty 100

## 2021-05-16 MED ORDER — SODIUM CHLORIDE 0.9 % IV SOLN
1.5000 mg/kg/h | INTRAVENOUS | Status: AC
  Administered 2021-05-17: 1.5 mg/kg/h via INTRAVENOUS
  Filled 2021-05-16: qty 25

## 2021-05-16 NOTE — Progress Notes (Signed)
PCP - Shirline Frees, MD Cardiologist - Quay Burow, MD  PPM/ICD - denies Device Orders - n/a Rep Notified - n/a  Chest x-ray - 05/16/2021 EKG - 05/12/2021 Stress Test - 04/04/2017 ECHO - 05/11/2021 Cardiac Cath - 05/11/2021  Sleep Study - yes, positive for OSA CPAP - yes  Fasting Blood Sugar - 105 - 120 Checks Blood Sugar 2 times a week CBG today - 106 A1C - done in PAT on 05/16/2021  Blood Thinner Instructions: n/a  Aspirin Instructions: Patient will hold Aspirin the day of surgery  ERAS Protcol - n/a  COVID TEST- done in PAT on 05/16/2021   Anesthesia review: yes - cardiac history - Karoline Caldwell PA-C reviewed patient's chart when patient was in PAT.  Patient denies shortness of breath, fever, cough and chest pain at PAT appointment   All instructions explained to the patient, with a verbal understanding of the material. Patient agrees to go over the instructions while at home for a better understanding. Patient also instructed to self quarantine after being tested for COVID-19. The opportunity to ask questions was provided.

## 2021-05-16 NOTE — Progress Notes (Signed)
Abnormal UA in PAT: Glucose, UA 50. Dr. Kipp Brood office was notified Justin Carr, Justin Knack, RN)

## 2021-05-16 NOTE — Progress Notes (Signed)
Pre cabg has been completed.   Preliminary results in CV Proc.   Justin Carr 05/16/2021 2:20 PM

## 2021-05-17 ENCOUNTER — Inpatient Hospital Stay (HOSPITAL_COMMUNITY): Payer: Medicare Other

## 2021-05-17 ENCOUNTER — Inpatient Hospital Stay (HOSPITAL_COMMUNITY): Payer: Medicare Other | Admitting: Critical Care Medicine

## 2021-05-17 ENCOUNTER — Inpatient Hospital Stay (HOSPITAL_COMMUNITY)
Admission: RE | Admit: 2021-05-17 | Discharge: 2021-05-22 | DRG: 236 | Disposition: A | Discharge status: Home / Self Care | Payer: Medicare Other | Attending: Thoracic Surgery (Cardiothoracic Vascular Surgery) | Admitting: Thoracic Surgery (Cardiothoracic Vascular Surgery)

## 2021-05-17 ENCOUNTER — Inpatient Hospital Stay (HOSPITAL_COMMUNITY)
Admission: RE | Disposition: A | Discharge status: Home / Self Care | Payer: Self-pay | Source: Home / Self Care | Attending: Thoracic Surgery (Cardiothoracic Vascular Surgery)

## 2021-05-17 ENCOUNTER — Other Ambulatory Visit: Payer: Self-pay

## 2021-05-17 ENCOUNTER — Encounter (HOSPITAL_COMMUNITY): Payer: Self-pay | Admitting: Thoracic Surgery (Cardiothoracic Vascular Surgery)

## 2021-05-17 DIAGNOSIS — Z7984 Long term (current) use of oral hypoglycemic drugs: Secondary | ICD-10-CM

## 2021-05-17 DIAGNOSIS — Z955 Presence of coronary angioplasty implant and graft: Secondary | ICD-10-CM

## 2021-05-17 DIAGNOSIS — D62 Acute posthemorrhagic anemia: Secondary | ICD-10-CM | POA: Diagnosis not present

## 2021-05-17 DIAGNOSIS — I480 Paroxysmal atrial fibrillation: Secondary | ICD-10-CM | POA: Diagnosis not present

## 2021-05-17 DIAGNOSIS — Z8249 Family history of ischemic heart disease and other diseases of the circulatory system: Secondary | ICD-10-CM

## 2021-05-17 DIAGNOSIS — D6959 Other secondary thrombocytopenia: Secondary | ICD-10-CM | POA: Diagnosis not present

## 2021-05-17 DIAGNOSIS — Z951 Presence of aortocoronary bypass graft: Principal | ICD-10-CM

## 2021-05-17 DIAGNOSIS — Z87891 Personal history of nicotine dependence: Secondary | ICD-10-CM

## 2021-05-17 DIAGNOSIS — E1151 Type 2 diabetes mellitus with diabetic peripheral angiopathy without gangrene: Secondary | ICD-10-CM

## 2021-05-17 DIAGNOSIS — J939 Pneumothorax, unspecified: Secondary | ICD-10-CM

## 2021-05-17 DIAGNOSIS — G4733 Obstructive sleep apnea (adult) (pediatric): Secondary | ICD-10-CM | POA: Diagnosis present

## 2021-05-17 DIAGNOSIS — I10 Essential (primary) hypertension: Secondary | ICD-10-CM | POA: Diagnosis present

## 2021-05-17 DIAGNOSIS — I252 Old myocardial infarction: Secondary | ICD-10-CM

## 2021-05-17 DIAGNOSIS — I2510 Atherosclerotic heart disease of native coronary artery without angina pectoris: Secondary | ICD-10-CM | POA: Diagnosis present

## 2021-05-17 DIAGNOSIS — Z20822 Contact with and (suspected) exposure to covid-19: Secondary | ICD-10-CM | POA: Diagnosis present

## 2021-05-17 DIAGNOSIS — Z79899 Other long term (current) drug therapy: Secondary | ICD-10-CM

## 2021-05-17 DIAGNOSIS — I25119 Atherosclerotic heart disease of native coronary artery with unspecified angina pectoris: Secondary | ICD-10-CM

## 2021-05-17 DIAGNOSIS — K219 Gastro-esophageal reflux disease without esophagitis: Secondary | ICD-10-CM | POA: Diagnosis present

## 2021-05-17 DIAGNOSIS — E785 Hyperlipidemia, unspecified: Secondary | ICD-10-CM | POA: Diagnosis present

## 2021-05-17 DIAGNOSIS — I440 Atrioventricular block, first degree: Secondary | ICD-10-CM | POA: Diagnosis present

## 2021-05-17 DIAGNOSIS — Z91010 Allergy to peanuts: Secondary | ICD-10-CM | POA: Diagnosis not present

## 2021-05-17 DIAGNOSIS — E1140 Type 2 diabetes mellitus with diabetic neuropathy, unspecified: Secondary | ICD-10-CM | POA: Diagnosis present

## 2021-05-17 DIAGNOSIS — Z7982 Long term (current) use of aspirin: Secondary | ICD-10-CM

## 2021-05-17 HISTORY — PX: TEE WITHOUT CARDIOVERSION: SHX5443

## 2021-05-17 HISTORY — PX: CORONARY ARTERY BYPASS GRAFT: SHX141

## 2021-05-17 LAB — PLATELET COUNT: Platelets: 122 10*3/uL — ABNORMAL LOW (ref 150–400)

## 2021-05-17 LAB — POCT I-STAT EG7
Acid-Base Excess: 1 mmol/L (ref 0.0–2.0)
Bicarbonate: 26.3 mmol/L (ref 20.0–28.0)
Calcium, Ion: 0.99 mmol/L — ABNORMAL LOW (ref 1.15–1.40)
HCT: 28 % — ABNORMAL LOW (ref 39.0–52.0)
Hemoglobin: 9.5 g/dL — ABNORMAL LOW (ref 13.0–17.0)
O2 Saturation: 82 %
Potassium: 6.1 mmol/L — ABNORMAL HIGH (ref 3.5–5.1)
Sodium: 136 mmol/L (ref 135–145)
TCO2: 28 mmol/L (ref 22–32)
pCO2, Ven: 41.9 mmHg — ABNORMAL LOW (ref 44–60)
pH, Ven: 7.406 (ref 7.25–7.43)
pO2, Ven: 46 mmHg — ABNORMAL HIGH (ref 32–45)

## 2021-05-17 LAB — POCT I-STAT, CHEM 8
BUN: 15 mg/dL (ref 8–23)
BUN: 14 mg/dL (ref 8–23)
BUN: 12 mg/dL (ref 8–23)
BUN: 13 mg/dL (ref 8–23)
Calcium, Ion: 1.16 mmol/L (ref 1.15–1.40)
Calcium, Ion: 1.17 mmol/L (ref 1.15–1.40)
Calcium, Ion: 0.87 mmol/L — CL (ref 1.15–1.40)
Calcium, Ion: 1.25 mmol/L (ref 1.15–1.40)
Chloride: 101 mmol/L (ref 98–111)
Chloride: 104 mmol/L (ref 98–111)
Chloride: 100 mmol/L (ref 98–111)
Chloride: 104 mmol/L (ref 98–111)
Creatinine, Ser: 0.8 mg/dL (ref 0.61–1.24)
Creatinine, Ser: 0.7 mg/dL (ref 0.61–1.24)
Creatinine, Ser: 0.4 mg/dL — ABNORMAL LOW (ref 0.61–1.24)
Creatinine, Ser: 0.7 mg/dL (ref 0.61–1.24)
Glucose, Bld: 122 mg/dL — ABNORMAL HIGH (ref 70–99)
Glucose, Bld: 134 mg/dL — ABNORMAL HIGH (ref 70–99)
Glucose, Bld: 96 mg/dL (ref 70–99)
Glucose, Bld: 158 mg/dL — ABNORMAL HIGH (ref 70–99)
HCT: 40 % (ref 39.0–52.0)
HCT: 39 % (ref 39.0–52.0)
HCT: 31 % — ABNORMAL LOW (ref 39.0–52.0)
HCT: 29 % — ABNORMAL LOW (ref 39.0–52.0)
Hemoglobin: 13.6 g/dL (ref 13.0–17.0)
Hemoglobin: 13.3 g/dL (ref 13.0–17.0)
Hemoglobin: 10.5 g/dL — ABNORMAL LOW (ref 13.0–17.0)
Hemoglobin: 9.9 g/dL — ABNORMAL LOW (ref 13.0–17.0)
Potassium: 4.6 mmol/L (ref 3.5–5.1)
Potassium: 4.1 mmol/L (ref 3.5–5.1)
Potassium: 4.2 mmol/L (ref 3.5–5.1)
Potassium: 3.8 mmol/L (ref 3.5–5.1)
Sodium: 138 mmol/L (ref 135–145)
Sodium: 139 mmol/L (ref 135–145)
Sodium: 137 mmol/L (ref 135–145)
Sodium: 139 mmol/L (ref 135–145)
TCO2: 28 mmol/L (ref 22–32)
TCO2: 26 mmol/L (ref 22–32)
TCO2: 21 mmol/L — ABNORMAL LOW (ref 22–32)
TCO2: 24 mmol/L (ref 22–32)

## 2021-05-17 LAB — POCT I-STAT 7, (LYTES, BLD GAS, ICA,H+H)
Acid-Base Excess: 3 mmol/L — ABNORMAL HIGH (ref 0.0–2.0)
Acid-Base Excess: 0 mmol/L (ref 0.0–2.0)
Acid-Base Excess: 4 mmol/L — ABNORMAL HIGH (ref 0.0–2.0)
Acid-Base Excess: 2 mmol/L (ref 0.0–2.0)
Acid-base deficit: 1 mmol/L (ref 0.0–2.0)
Acid-base deficit: 4 mmol/L — ABNORMAL HIGH (ref 0.0–2.0)
Acid-base deficit: 6 mmol/L — ABNORMAL HIGH (ref 0.0–2.0)
Bicarbonate: 27 mmol/L (ref 20.0–28.0)
Bicarbonate: 24.2 mmol/L (ref 20.0–28.0)
Bicarbonate: 27.5 mmol/L (ref 20.0–28.0)
Bicarbonate: 25 mmol/L (ref 20.0–28.0)
Bicarbonate: 24.6 mmol/L (ref 20.0–28.0)
Bicarbonate: 24 mmol/L (ref 20.0–28.0)
Bicarbonate: 22.1 mmol/L (ref 20.0–28.0)
Calcium, Ion: 1.01 mmol/L — ABNORMAL LOW (ref 1.15–1.40)
Calcium, Ion: 0.87 mmol/L — CL (ref 1.15–1.40)
Calcium, Ion: 1.02 mmol/L — ABNORMAL LOW (ref 1.15–1.40)
Calcium, Ion: 1.26 mmol/L (ref 1.15–1.40)
Calcium, Ion: 1.27 mmol/L (ref 1.15–1.40)
Calcium, Ion: 1.2 mmol/L (ref 1.15–1.40)
Calcium, Ion: 1.23 mmol/L (ref 1.15–1.40)
HCT: 31 % — ABNORMAL LOW (ref 39.0–52.0)
HCT: 26 % — ABNORMAL LOW (ref 39.0–52.0)
HCT: 29 % — ABNORMAL LOW (ref 39.0–52.0)
HCT: 28 % — ABNORMAL LOW (ref 39.0–52.0)
HCT: 32 % — ABNORMAL LOW (ref 39.0–52.0)
HCT: 33 % — ABNORMAL LOW (ref 39.0–52.0)
HCT: 37 % — ABNORMAL LOW (ref 39.0–52.0)
Hemoglobin: 10.5 g/dL — ABNORMAL LOW (ref 13.0–17.0)
Hemoglobin: 8.8 g/dL — ABNORMAL LOW (ref 13.0–17.0)
Hemoglobin: 9.9 g/dL — ABNORMAL LOW (ref 13.0–17.0)
Hemoglobin: 9.5 g/dL — ABNORMAL LOW (ref 13.0–17.0)
Hemoglobin: 10.9 g/dL — ABNORMAL LOW (ref 13.0–17.0)
Hemoglobin: 11.2 g/dL — ABNORMAL LOW (ref 13.0–17.0)
Hemoglobin: 12.6 g/dL — ABNORMAL LOW (ref 13.0–17.0)
O2 Saturation: 100 %
O2 Saturation: 100 %
O2 Saturation: 100 %
O2 Saturation: 99 %
O2 Saturation: 99 %
O2 Saturation: 97 %
O2 Saturation: 89 %
Patient temperature: 97.6
Patient temperature: 36
Patient temperature: 37.2
Potassium: 4 mmol/L (ref 3.5–5.1)
Potassium: 4.2 mmol/L (ref 3.5–5.1)
Potassium: 4.6 mmol/L (ref 3.5–5.1)
Potassium: 3.8 mmol/L (ref 3.5–5.1)
Potassium: 3.1 mmol/L — ABNORMAL LOW (ref 3.5–5.1)
Potassium: 4.4 mmol/L (ref 3.5–5.1)
Potassium: 4.4 mmol/L (ref 3.5–5.1)
Sodium: 138 mmol/L (ref 135–145)
Sodium: 136 mmol/L (ref 135–145)
Sodium: 141 mmol/L (ref 135–145)
Sodium: 140 mmol/L (ref 135–145)
Sodium: 140 mmol/L (ref 135–145)
Sodium: 143 mmol/L (ref 135–145)
Sodium: 141 mmol/L (ref 135–145)
TCO2: 28 mmol/L (ref 22–32)
TCO2: 25 mmol/L (ref 22–32)
TCO2: 29 mmol/L (ref 22–32)
TCO2: 26 mmol/L (ref 22–32)
TCO2: 26 mmol/L (ref 22–32)
TCO2: 26 mmol/L (ref 22–32)
TCO2: 24 mmol/L (ref 22–32)
pCO2 arterial: 37.5 mmHg (ref 32–48)
pCO2 arterial: 34.5 mmHg (ref 32–48)
pCO2 arterial: 35.1 mmHg (ref 32–48)
pCO2 arterial: 32.2 mmHg (ref 32–48)
pCO2 arterial: 42.2 mmHg (ref 32–48)
pCO2 arterial: 57.1 mmHg — ABNORMAL HIGH (ref 32–48)
pCO2 arterial: 54.6 mmHg — ABNORMAL HIGH (ref 32–48)
pH, Arterial: 7.466 — ABNORMAL HIGH (ref 7.35–7.45)
pH, Arterial: 7.455 — ABNORMAL HIGH (ref 7.35–7.45)
pH, Arterial: 7.503 — ABNORMAL HIGH (ref 7.35–7.45)
pH, Arterial: 7.497 — ABNORMAL HIGH (ref 7.35–7.45)
pH, Arterial: 7.372 (ref 7.35–7.45)
pH, Arterial: 7.227 — ABNORMAL LOW (ref 7.35–7.45)
pH, Arterial: 7.216 — ABNORMAL LOW (ref 7.35–7.45)
pO2, Arterial: 262 mmHg — ABNORMAL HIGH (ref 83–108)
pO2, Arterial: 231 mmHg — ABNORMAL HIGH (ref 83–108)
pO2, Arterial: 389 mmHg — ABNORMAL HIGH (ref 83–108)
pO2, Arterial: 111 mmHg — ABNORMAL HIGH (ref 83–108)
pO2, Arterial: 154 mmHg — ABNORMAL HIGH (ref 83–108)
pO2, Arterial: 111 mmHg — ABNORMAL HIGH (ref 83–108)
pO2, Arterial: 69 mmHg — ABNORMAL LOW (ref 83–108)

## 2021-05-17 LAB — CBC
HCT: 34.2 % — ABNORMAL LOW (ref 39.0–52.0)
HCT: 38.1 % — ABNORMAL LOW (ref 39.0–52.0)
Hemoglobin: 11.7 g/dL — ABNORMAL LOW (ref 13.0–17.0)
Hemoglobin: 13.2 g/dL (ref 13.0–17.0)
MCH: 33.1 pg (ref 26.0–34.0)
MCH: 33.8 pg (ref 26.0–34.0)
MCHC: 34.2 g/dL (ref 30.0–36.0)
MCHC: 34.6 g/dL (ref 30.0–36.0)
MCV: 96.6 fL (ref 80.0–100.0)
MCV: 97.4 fL (ref 80.0–100.0)
Platelets: 105 10*3/uL — ABNORMAL LOW (ref 150–400)
Platelets: 149 10*3/uL — ABNORMAL LOW (ref 150–400)
RBC: 3.54 MIL/uL — ABNORMAL LOW (ref 4.22–5.81)
RBC: 3.91 MIL/uL — ABNORMAL LOW (ref 4.22–5.81)
RDW: 12.8 % (ref 11.5–15.5)
RDW: 13.2 % (ref 11.5–15.5)
WBC: 6.7 10*3/uL (ref 4.0–10.5)
WBC: 16.1 10*3/uL — ABNORMAL HIGH (ref 4.0–10.5)
nRBC: 0 % (ref 0.0–0.2)
nRBC: 0 % (ref 0.0–0.2)

## 2021-05-17 LAB — APTT: aPTT: 38 seconds — ABNORMAL HIGH (ref 24–36)

## 2021-05-17 LAB — BASIC METABOLIC PANEL
Anion gap: 8 (ref 5–15)
BUN: 13 mg/dL (ref 8–23)
CO2: 22 mmol/L (ref 22–32)
Calcium: 7.7 mg/dL — ABNORMAL LOW (ref 8.9–10.3)
Chloride: 110 mmol/L (ref 98–111)
Creatinine, Ser: 0.93 mg/dL (ref 0.61–1.24)
GFR, Estimated: >60 mL/min (ref >60)
Glucose, Bld: 163 mg/dL — ABNORMAL HIGH (ref 70–99)
Potassium: 4.3 mmol/L (ref 3.5–5.1)
Sodium: 140 mmol/L (ref 135–145)

## 2021-05-17 LAB — GLUCOSE, CAPILLARY
Glucose-Capillary: 149 mg/dL — ABNORMAL HIGH (ref 70–99)
Glucose-Capillary: 96 mg/dL (ref 70–99)
Glucose-Capillary: 116 mg/dL — ABNORMAL HIGH (ref 70–99)
Glucose-Capillary: 91 mg/dL (ref 70–99)
Glucose-Capillary: 112 mg/dL — ABNORMAL HIGH (ref 70–99)
Glucose-Capillary: 157 mg/dL — ABNORMAL HIGH (ref 70–99)
Glucose-Capillary: 128 mg/dL — ABNORMAL HIGH (ref 70–99)
Glucose-Capillary: 126 mg/dL — ABNORMAL HIGH (ref 70–99)
Glucose-Capillary: 112 mg/dL — ABNORMAL HIGH (ref 70–99)

## 2021-05-17 LAB — ECHO INTRAOPERATIVE TEE

## 2021-05-17 LAB — ABO/RH: ABO/RH(D): A POS

## 2021-05-17 LAB — SARS CORONAVIRUS 2 (TAT 6-24 HRS): SARS Coronavirus 2: NEGATIVE

## 2021-05-17 LAB — MAGNESIUM: Magnesium: 3.1 mg/dL — ABNORMAL HIGH (ref 1.7–2.4)

## 2021-05-17 LAB — HEMOGLOBIN AND HEMATOCRIT, BLOOD
HCT: 29.9 % — ABNORMAL LOW (ref 39.0–52.0)
Hemoglobin: 10.9 g/dL — ABNORMAL LOW (ref 13.0–17.0)

## 2021-05-17 LAB — PROTIME-INR
INR: 1.4 — ABNORMAL HIGH (ref 0.8–1.2)
Prothrombin Time: 17 seconds — ABNORMAL HIGH (ref 11.4–15.2)

## 2021-05-17 SURGERY — CORONARY ARTERY BYPASS GRAFTING (CABG)
Anesthesia: General | Site: Chest

## 2021-05-17 MED ORDER — DEXTROSE 50 % IV SOLN: 0.0000 mL | INTRAVENOUS | Status: DC | PRN

## 2021-05-17 MED ORDER — LACTATED RINGERS IV SOLN: INTRAVENOUS | Status: DC | PRN

## 2021-05-17 MED ORDER — PANTOPRAZOLE SODIUM 40 MG PO TBEC
40.0000 mg | DELAYED_RELEASE_TABLET | Freq: Every day | ORAL | Status: DC
  Administered 2021-05-19 – 2021-05-22 (×4): 40 mg via ORAL
  Filled 2021-05-17 (×4): qty 1

## 2021-05-17 MED ORDER — FENTANYL CITRATE (PF) 250 MCG/5ML IJ SOLN
INTRAMUSCULAR | Status: AC
  Filled 2021-05-17: qty 5

## 2021-05-17 MED ORDER — EPHEDRINE SULFATE-NACL 50-0.9 MG/10ML-% IV SOSY
PREFILLED_SYRINGE | INTRAVENOUS | Status: DC | PRN
  Administered 2021-05-17: 5 mg via INTRAVENOUS

## 2021-05-17 MED ORDER — SODIUM CHLORIDE 0.9% FLUSH
3.0000 mL | Freq: Two times a day (BID) | INTRAVENOUS | Status: DC
  Administered 2021-05-18 (×2): 3 mL via INTRAVENOUS

## 2021-05-17 MED ORDER — ALBUMIN HUMAN 5 % IV SOLN: INTRAVENOUS | Status: DC | PRN

## 2021-05-17 MED ORDER — PLASMA-LYTE A IV SOLN
INTRAVENOUS | Status: DC | PRN
  Administered 2021-05-17: 300 mL

## 2021-05-17 MED ORDER — FENTANYL CITRATE (PF) 250 MCG/5ML IJ SOLN
INTRAMUSCULAR | Status: DC | PRN
Start: 2021-05-17 — End: 2021-05-17
  Administered 2021-05-17 (×4): 100 ug via INTRAVENOUS
  Administered 2021-05-17: 200 ug via INTRAVENOUS
  Administered 2021-05-17 (×2): 150 ug via INTRAVENOUS
  Administered 2021-05-17: 200 ug via INTRAVENOUS
  Administered 2021-05-17: 150 ug via INTRAVENOUS
  Administered 2021-05-17: 100 ug via INTRAVENOUS
  Administered 2021-05-17: 250 ug via INTRAVENOUS
  Administered 2021-05-17: 50 ug via INTRAVENOUS
  Administered 2021-05-17 (×6): 100 ug via INTRAVENOUS

## 2021-05-17 MED ORDER — ORAL CARE MOUTH RINSE: 15.0000 mL | Freq: Once | OROMUCOSAL | Status: AC

## 2021-05-17 MED ORDER — POTASSIUM CHLORIDE 10 MEQ/50ML IV SOLN
10.0000 meq | INTRAVENOUS | Status: AC
  Administered 2021-05-17 (×3): 10 meq via INTRAVENOUS

## 2021-05-17 MED ORDER — NITROGLYCERIN IN D5W 200-5 MCG/ML-% IV SOLN: 0.0000 ug/min | INTRAVENOUS | Status: DC

## 2021-05-17 MED ORDER — MAGNESIUM SULFATE 4 GM/100ML IV SOLN
4.0000 g | Freq: Once | INTRAVENOUS | Status: AC
  Administered 2021-05-17: 4 g via INTRAVENOUS
  Filled 2021-05-17: qty 100

## 2021-05-17 MED ORDER — ASPIRIN EC 325 MG PO TBEC
325.0000 mg | DELAYED_RELEASE_TABLET | Freq: Every day | ORAL | Status: DC
  Administered 2021-05-18 – 2021-05-22 (×4): 325 mg via ORAL
  Filled 2021-05-17 (×4): qty 1

## 2021-05-17 MED ORDER — LACTATED RINGERS IV SOLN: INTRAVENOUS | Status: DC

## 2021-05-17 MED ORDER — SODIUM CHLORIDE 0.9 % IV SOLN: INTRAVENOUS | Status: DC

## 2021-05-17 MED ORDER — DOBUTAMINE IN D5W 4-5 MG/ML-% IV SOLN: 2.5000 ug/kg/min | INTRAVENOUS | Status: DC

## 2021-05-17 MED ORDER — FAMOTIDINE IN NACL 20-0.9 MG/50ML-% IV SOLN
20.0000 mg | Freq: Two times a day (BID) | INTRAVENOUS | Status: AC
  Administered 2021-05-17: 20 mg via INTRAVENOUS
  Filled 2021-05-17: qty 50

## 2021-05-17 MED ORDER — METOPROLOL TARTRATE 5 MG/5ML IV SOLN
2.5000 mg | INTRAVENOUS | Status: DC | PRN
  Administered 2021-05-19 – 2021-05-21 (×3): 5 mg via INTRAVENOUS
  Filled 2021-05-17 (×3): qty 5

## 2021-05-17 MED ORDER — SODIUM CHLORIDE 0.9 % IV SOLN: 250.0000 mL | INTRAVENOUS | Status: DC

## 2021-05-17 MED ORDER — BISACODYL 5 MG PO TBEC
10.0000 mg | DELAYED_RELEASE_TABLET | Freq: Every day | ORAL | Status: DC
  Administered 2021-05-18 – 2021-05-20 (×3): 10 mg via ORAL
  Filled 2021-05-17 (×4): qty 2

## 2021-05-17 MED ORDER — LACTATED RINGERS IV SOLN: 500.0000 mL | Freq: Once | INTRAVENOUS | Status: DC | PRN

## 2021-05-17 MED ORDER — EPINEPHRINE HCL 5 MG/250ML IV SOLN IN NS: 0.0000 ug/min | INTRAVENOUS | Status: DC

## 2021-05-17 MED ORDER — CHLORHEXIDINE GLUCONATE 0.12 % MT SOLN
15.0000 mL | OROMUCOSAL | Status: AC
  Administered 2021-05-17: 15 mL via OROMUCOSAL

## 2021-05-17 MED ORDER — PHENYLEPHRINE 40 MCG/ML (10ML) SYRINGE FOR IV PUSH (FOR BLOOD PRESSURE SUPPORT)
PREFILLED_SYRINGE | INTRAVENOUS | Status: DC | PRN
  Administered 2021-05-17: 20 ug via INTRAVENOUS
  Administered 2021-05-17 (×3): 40 ug via INTRAVENOUS

## 2021-05-17 MED ORDER — CHLORHEXIDINE GLUCONATE 0.12 % MT SOLN
15.0000 mL | Freq: Once | OROMUCOSAL | Status: DC
  Filled 2021-05-17: qty 15

## 2021-05-17 MED ORDER — ASPIRIN 81 MG PO CHEW
324.0000 mg | CHEWABLE_TABLET | Freq: Every day | ORAL | Status: DC
  Administered 2021-05-21: 324 mg
  Filled 2021-05-17: qty 4

## 2021-05-17 MED ORDER — ORAL CARE MOUTH RINSE
15.0000 mL | OROMUCOSAL | Status: DC
  Administered 2021-05-17 (×2): 15 mL via OROMUCOSAL

## 2021-05-17 MED ORDER — MIDAZOLAM HCL 2 MG/2ML IJ SOLN: 2.0000 mg | INTRAMUSCULAR | Status: DC | PRN

## 2021-05-17 MED ORDER — VANCOMYCIN HCL IN DEXTROSE 1-5 GM/200ML-% IV SOLN
1000.0000 mg | Freq: Once | INTRAVENOUS | Status: AC
  Administered 2021-05-17: 1000 mg via INTRAVENOUS
  Filled 2021-05-17: qty 200

## 2021-05-17 MED ORDER — FAMOTIDINE IN NACL 20-0.9 MG/50ML-% IV SOLN
INTRAVENOUS | Status: AC
  Administered 2021-05-17: 20 mg via INTRAVENOUS
  Filled 2021-05-17: qty 50

## 2021-05-17 MED ORDER — ARTIFICIAL TEARS OPHTHALMIC OINT
TOPICAL_OINTMENT | OPHTHALMIC | Status: DC | PRN
  Administered 2021-05-17: 1 via OPHTHALMIC

## 2021-05-17 MED ORDER — CHLORHEXIDINE GLUCONATE 0.12 % MT SOLN
15.0000 mL | Freq: Once | OROMUCOSAL | Status: AC
  Administered 2021-05-17: 15 mL via OROMUCOSAL

## 2021-05-17 MED ORDER — PROTAMINE SULFATE 10 MG/ML IV SOLN
INTRAVENOUS | Status: DC | PRN
  Administered 2021-05-17: 300 mg via INTRAVENOUS

## 2021-05-17 MED ORDER — METOPROLOL TARTRATE 12.5 MG HALF TABLET
12.5000 mg | ORAL_TABLET | Freq: Once | ORAL | Status: AC
  Administered 2021-05-17: 12.5 mg via ORAL
  Filled 2021-05-17: qty 1

## 2021-05-17 MED ORDER — ACETAMINOPHEN 650 MG RE SUPP
650.0000 mg | Freq: Once | RECTAL | Status: AC
  Administered 2021-05-17: 650 mg via RECTAL

## 2021-05-17 MED ORDER — CHLORHEXIDINE GLUCONATE 0.12% ORAL RINSE (MEDLINE KIT): 15.0000 mL | Freq: Two times a day (BID) | OROMUCOSAL | Status: DC

## 2021-05-17 MED ORDER — HEPARIN SODIUM (PORCINE) 1000 UNIT/ML IJ SOLN
INTRAMUSCULAR | Status: AC
  Filled 2021-05-17: qty 1

## 2021-05-17 MED ORDER — TRAMADOL HCL 50 MG PO TABS
50.0000 mg | ORAL_TABLET | ORAL | Status: DC | PRN
  Administered 2021-05-17 – 2021-05-18 (×4): 100 mg via ORAL
  Filled 2021-05-17 (×3): qty 2
  Filled 2021-05-17: qty 1
  Filled 2021-05-17: qty 2

## 2021-05-17 MED ORDER — 0.9 % SODIUM CHLORIDE (POUR BTL) OPTIME
TOPICAL | Status: DC | PRN
  Administered 2021-05-17 (×4): 1000 mL

## 2021-05-17 MED ORDER — ALBUMIN HUMAN 5 % IV SOLN
250.0000 mL | INTRAVENOUS | Status: AC | PRN
  Administered 2021-05-17 (×4): 12.5 g via INTRAVENOUS
  Filled 2021-05-17 (×2): qty 250

## 2021-05-17 MED ORDER — CHLORHEXIDINE GLUCONATE 4 % EX LIQD: 30.0000 mL | CUTANEOUS | Status: DC

## 2021-05-17 MED ORDER — SODIUM CHLORIDE 0.45 % IV SOLN: INTRAVENOUS | Status: DC | PRN

## 2021-05-17 MED ORDER — NICARDIPINE HCL IN NACL 20-0.86 MG/200ML-% IV SOLN: 5.0000 mg/h | INTRAVENOUS | Status: DC

## 2021-05-17 MED ORDER — MIDAZOLAM HCL (PF) 10 MG/2ML IJ SOLN
INTRAMUSCULAR | Status: AC
  Filled 2021-05-17: qty 2

## 2021-05-17 MED ORDER — ONDANSETRON HCL 4 MG/2ML IJ SOLN: 4.0000 mg | Freq: Four times a day (QID) | INTRAMUSCULAR | Status: DC | PRN

## 2021-05-17 MED ORDER — ~~LOC~~ CARDIAC SURGERY, PATIENT & FAMILY EDUCATION
Freq: Once | Status: DC
  Filled 2021-05-17: qty 1

## 2021-05-17 MED ORDER — OXYCODONE HCL 5 MG PO TABS
5.0000 mg | ORAL_TABLET | ORAL | Status: DC | PRN
  Administered 2021-05-17 – 2021-05-19 (×7): 10 mg via ORAL
  Filled 2021-05-17 (×7): qty 2

## 2021-05-17 MED ORDER — ONDANSETRON HCL 4 MG/2ML IJ SOLN
INTRAMUSCULAR | Status: DC | PRN
  Administered 2021-05-17: 4 mg via INTRAVENOUS

## 2021-05-17 MED ORDER — DOCUSATE SODIUM 100 MG PO CAPS
200.0000 mg | ORAL_CAPSULE | Freq: Every day | ORAL | Status: DC
  Administered 2021-05-18 – 2021-05-22 (×5): 200 mg via ORAL
  Filled 2021-05-17 (×5): qty 2

## 2021-05-17 MED ORDER — MORPHINE SULFATE (PF) 2 MG/ML IV SOLN
1.0000 mg | INTRAVENOUS | Status: DC | PRN
  Administered 2021-05-18: 4 mg via INTRAVENOUS
  Administered 2021-05-18 – 2021-05-19 (×2): 2 mg via INTRAVENOUS
  Filled 2021-05-17: qty 1
  Filled 2021-05-17: qty 2
  Filled 2021-05-17 (×2): qty 1

## 2021-05-17 MED ORDER — METOPROLOL TARTRATE 25 MG/10 ML ORAL SUSPENSION: 12.5000 mg | Freq: Two times a day (BID) | ORAL | Status: DC

## 2021-05-17 MED ORDER — ACETAMINOPHEN 160 MG/5ML PO SOLN: 650.0000 mg | Freq: Once | ORAL | Status: AC

## 2021-05-17 MED ORDER — SODIUM CHLORIDE 0.9% FLUSH
10.0000 mL | Freq: Two times a day (BID) | INTRAVENOUS | Status: DC
  Administered 2021-05-17 – 2021-05-19 (×6): 10 mL

## 2021-05-17 MED ORDER — BISACODYL 10 MG RE SUPP: 10.0000 mg | Freq: Every day | RECTAL | Status: DC

## 2021-05-17 MED ORDER — SUCCINYLCHOLINE CHLORIDE 200 MG/10ML IV SOSY
PREFILLED_SYRINGE | INTRAVENOUS | Status: DC | PRN
  Administered 2021-05-17: 120 mg via INTRAVENOUS

## 2021-05-17 MED ORDER — METOPROLOL TARTRATE 12.5 MG HALF TABLET
12.5000 mg | ORAL_TABLET | Freq: Two times a day (BID) | ORAL | Status: DC
  Administered 2021-05-18 – 2021-05-20 (×6): 12.5 mg via ORAL
  Filled 2021-05-17 (×7): qty 1

## 2021-05-17 MED ORDER — ORAL CARE MOUTH RINSE
15.0000 mL | Freq: Two times a day (BID) | OROMUCOSAL | Status: DC
  Administered 2021-05-17 – 2021-05-21 (×9): 15 mL via OROMUCOSAL

## 2021-05-17 MED ORDER — SODIUM CHLORIDE (PF) 0.9 % IJ SOLN: OROMUCOSAL | Status: DC | PRN

## 2021-05-17 MED ORDER — SODIUM CHLORIDE 0.9% FLUSH: 3.0000 mL | INTRAVENOUS | Status: DC | PRN

## 2021-05-17 MED ORDER — INSULIN REGULAR(HUMAN) IN NACL 100-0.9 UT/100ML-% IV SOLN: INTRAVENOUS | Status: DC

## 2021-05-17 MED ORDER — DEXMEDETOMIDINE HCL IN NACL 400 MCG/100ML IV SOLN
INTRAVENOUS | Status: AC
  Administered 2021-05-17: 0.6 ug/kg/h via INTRAVENOUS
  Filled 2021-05-17: qty 100

## 2021-05-17 MED ORDER — DEXMEDETOMIDINE HCL IN NACL 400 MCG/100ML IV SOLN: 0.0000 ug/kg/h | INTRAVENOUS | Status: DC

## 2021-05-17 MED ORDER — HEPARIN SODIUM (PORCINE) 1000 UNIT/ML IJ SOLN
INTRAMUSCULAR | Status: DC | PRN
  Administered 2021-05-17: 30000 [IU] via INTRAVENOUS

## 2021-05-17 MED ORDER — SODIUM CHLORIDE 0.9% FLUSH: 10.0000 mL | INTRAVENOUS | Status: DC | PRN

## 2021-05-17 MED ORDER — ACETAMINOPHEN 160 MG/5ML PO SOLN: 1000.0000 mg | Freq: Four times a day (QID) | ORAL | Status: DC

## 2021-05-17 MED ORDER — PROPOFOL 10 MG/ML IV BOLUS
INTRAVENOUS | Status: DC | PRN
  Administered 2021-05-17: 30 mg via INTRAVENOUS
  Administered 2021-05-17: 140 mg via INTRAVENOUS

## 2021-05-17 MED ORDER — PROPOFOL 10 MG/ML IV BOLUS
INTRAVENOUS | Status: AC
  Filled 2021-05-17: qty 20

## 2021-05-17 MED ORDER — HEMOSTATIC AGENTS (NO CHARGE) OPTIME: TOPICAL | Status: DC | PRN

## 2021-05-17 MED ORDER — SODIUM CHLORIDE 0.9 % IV SOLN: INTRAVENOUS | Status: DC | PRN

## 2021-05-17 MED ORDER — CHLORHEXIDINE GLUCONATE CLOTH 2 % EX PADS
6.0000 | MEDICATED_PAD | Freq: Every day | CUTANEOUS | Status: DC
  Administered 2021-05-17 – 2021-05-20 (×4): 6 via TOPICAL

## 2021-05-17 MED ORDER — PROTAMINE SULFATE 10 MG/ML IV SOLN
INTRAVENOUS | Status: AC
  Filled 2021-05-17: qty 25

## 2021-05-17 MED ORDER — PHENYLEPHRINE HCL-NACL 20-0.9 MG/250ML-% IV SOLN
0.0000 ug/min | INTRAVENOUS | Status: DC
  Administered 2021-05-17: 40 ug/min via INTRAVENOUS
  Filled 2021-05-17 (×2): qty 250

## 2021-05-17 MED ORDER — ACETAMINOPHEN 500 MG PO TABS
1000.0000 mg | ORAL_TABLET | Freq: Four times a day (QID) | ORAL | Status: DC
  Administered 2021-05-17 – 2021-05-21 (×14): 1000 mg via ORAL
  Filled 2021-05-17 (×14): qty 2

## 2021-05-17 MED ORDER — CEFAZOLIN SODIUM-DEXTROSE 2-4 GM/100ML-% IV SOLN
2.0000 g | Freq: Three times a day (TID) | INTRAVENOUS | Status: AC
  Administered 2021-05-17 – 2021-05-19 (×6): 2 g via INTRAVENOUS
  Filled 2021-05-17 (×6): qty 100

## 2021-05-17 MED ORDER — MIDAZOLAM HCL (PF) 5 MG/ML IJ SOLN
INTRAMUSCULAR | Status: DC | PRN
  Administered 2021-05-17 (×3): 2 mg via INTRAVENOUS
  Administered 2021-05-17: 4 mg via INTRAVENOUS

## 2021-05-17 MED ORDER — ROSUVASTATIN CALCIUM 20 MG PO TABS
20.0000 mg | ORAL_TABLET | ORAL | Status: DC
  Administered 2021-05-19 – 2021-05-22 (×2): 20 mg via ORAL
  Filled 2021-05-17 (×2): qty 1

## 2021-05-17 MED ORDER — ROCURONIUM BROMIDE 10 MG/ML (PF) SYRINGE
PREFILLED_SYRINGE | INTRAVENOUS | Status: DC | PRN
Start: 2021-05-17 — End: 2021-05-17
  Administered 2021-05-17 (×2): 50 mg via INTRAVENOUS
  Administered 2021-05-17 (×2): 100 mg via INTRAVENOUS

## 2021-05-17 SURGICAL SUPPLY — 80 items
BAG DECANTER FOR FLEXI CONT (MISCELLANEOUS) ×3 IMPLANT
BLADE CLIPPER SURG (BLADE) ×3 IMPLANT
BLADE STERNUM SYSTEM 6 (BLADE) ×3 IMPLANT
BNDG ELASTIC 4X5.8 VLCR STR LF (GAUZE/BANDAGES/DRESSINGS) ×3 IMPLANT
BNDG ELASTIC 6X5.8 VLCR STR LF (GAUZE/BANDAGES/DRESSINGS) ×3 IMPLANT
BNDG GAUZE ELAST 4 BULKY (GAUZE/BANDAGES/DRESSINGS) ×3 IMPLANT
CABLE SURGICAL S-101-97-12 (CABLE) ×3 IMPLANT
CANISTER SUCT 3000ML PPV (MISCELLANEOUS) ×3 IMPLANT
CANNULA MC2 2 STG 29/37 NON-V (CANNULA) ×2 IMPLANT
CANNULA MC2 TWO STAGE (CANNULA) ×2
CANNULA NON VENT 20FR 12 (CANNULA) ×3 IMPLANT
CATH ROBINSON RED A/P 18FR (CATHETERS) ×6 IMPLANT
CLIP RETRACTION 3.0MM CORONARY (MISCELLANEOUS) ×3 IMPLANT
CLIP VESOCCLUDE MED 24/CT (CLIP) IMPLANT
CLIP VESOCCLUDE SM WIDE 24/CT (CLIP) IMPLANT
CONN 3/8X3/8 GISH STERILE (MISCELLANEOUS) ×1 IMPLANT
CONN ST 1/2X1/2  BEN (MISCELLANEOUS) ×2
CONN ST 1/2X1/2 BEN (MISCELLANEOUS) ×2 IMPLANT
CONNECTOR BLAKE 2:1 CARIO BLK (MISCELLANEOUS) ×3 IMPLANT
CONTAINER PROTECT SURGISLUSH (MISCELLANEOUS) ×6 IMPLANT
DRAIN CHANNEL 19F RND (DRAIN) ×9 IMPLANT
DRAIN CONNECTOR BLAKE 1:1 (MISCELLANEOUS) ×3 IMPLANT
DRAPE CARDIOVASCULAR INCISE (DRAPES) ×2
DRAPE INCISE IOBAN 66X45 STRL (DRAPES) IMPLANT
DRAPE SRG 135X102X78XABS (DRAPES) ×2 IMPLANT
DRAPE WARM FLUID 44X44 (DRAPES) ×3 IMPLANT
DRSG AQUACEL AG ADV 3.5X10 (GAUZE/BANDAGES/DRESSINGS) ×3 IMPLANT
DRSG COVADERM 4X14 (GAUZE/BANDAGES/DRESSINGS) ×3 IMPLANT
ELECT BLADE 4.0 EZ CLEAN MEGAD (MISCELLANEOUS) ×2
ELECT REM PT RETURN 9FT ADLT (ELECTROSURGICAL) ×4
ELECTRODE BLDE 4.0 EZ CLN MEGD (MISCELLANEOUS) ×2 IMPLANT
ELECTRODE REM PT RTRN 9FT ADLT (ELECTROSURGICAL) ×4 IMPLANT
FELT TEFLON 1X6 (MISCELLANEOUS) ×6 IMPLANT
GAUZE 4X4 16PLY ~~LOC~~+RFID DBL (SPONGE) ×3 IMPLANT
GAUZE SPONGE 4X4 12PLY STRL (GAUZE/BANDAGES/DRESSINGS) ×6 IMPLANT
GLOVE SURG ENC MOIS LTX SZ7 (GLOVE) ×6 IMPLANT
GLOVE SURG ENC TEXT LTX SZ7.5 (GLOVE) ×6 IMPLANT
GOWN STRL REUS W/ TWL LRG LVL3 (GOWN DISPOSABLE) ×8 IMPLANT
GOWN STRL REUS W/ TWL XL LVL3 (GOWN DISPOSABLE) ×4 IMPLANT
GOWN STRL REUS W/TWL LRG LVL3 (GOWN DISPOSABLE) ×8
GOWN STRL REUS W/TWL XL LVL3 (GOWN DISPOSABLE) ×4
HEMOSTAT POWDER SURGIFOAM 1G (HEMOSTASIS) ×9 IMPLANT
INSERT SUTURE HOLDER (MISCELLANEOUS) ×3 IMPLANT
KIT BASIN OR (CUSTOM PROCEDURE TRAY) ×3 IMPLANT
KIT SUCTION CATH 14FR (SUCTIONS) ×3 IMPLANT
KIT TURNOVER KIT B (KITS) ×3 IMPLANT
KIT VASOVIEW HEMOPRO 2 VH 4000 (KITS) ×3 IMPLANT
LEAD PACING MYOCARDI (MISCELLANEOUS) ×3 IMPLANT
MARKER GRAFT CORONARY BYPASS (MISCELLANEOUS) ×9 IMPLANT
NS IRRIG 1000ML POUR BTL (IV SOLUTION) ×15 IMPLANT
PACK ACCESSORY CANNULA KIT (KITS) ×3 IMPLANT
PACK E OPEN HEART (SUTURE) ×3 IMPLANT
PACK OPEN HEART (CUSTOM PROCEDURE TRAY) ×3 IMPLANT
PAD ARMBOARD 7.5X6 YLW CONV (MISCELLANEOUS) ×6 IMPLANT
PAD ELECT DEFIB RADIOL ZOLL (MISCELLANEOUS) ×3 IMPLANT
PENCIL BUTTON HOLSTER BLD 10FT (ELECTRODE) ×3 IMPLANT
POSITIONER HEAD DONUT 9IN (MISCELLANEOUS) ×3 IMPLANT
PUNCH AORTIC ROTATE 4.0MM (MISCELLANEOUS) ×3 IMPLANT
SET MPS 3-ND DEL (MISCELLANEOUS) ×1 IMPLANT
SPONGE T-LAP 18X18 ~~LOC~~+RFID (SPONGE) ×12 IMPLANT
SUPPORT HEART JANKE-BARRON (MISCELLANEOUS) ×3 IMPLANT
SUT BONE WAX W31G (SUTURE) ×3 IMPLANT
SUT ETHIBOND X763 2 0 SH 1 (SUTURE) ×6 IMPLANT
SUT MNCRL AB 3-0 PS2 18 (SUTURE) ×6 IMPLANT
SUT PDS AB 1 CTX 36 (SUTURE) ×6 IMPLANT
SUT PROLENE 4 0 RB 1 (SUTURE) ×2
SUT PROLENE 4 0 SH DA (SUTURE) ×3 IMPLANT
SUT PROLENE 4-0 RB1 .5 CRCL 36 (SUTURE) IMPLANT
SUT PROLENE 5 0 C 1 36 (SUTURE) ×9 IMPLANT
SUT PROLENE 7 0 BV1 MDA (SUTURE) ×4 IMPLANT
SUT STEEL 6MS V (SUTURE) ×6 IMPLANT
SUT VIC AB 2-0 CT1 27 (SUTURE) ×2
SUT VIC AB 2-0 CT1 TAPERPNT 27 (SUTURE) IMPLANT
SYSTEM SAHARA CHEST DRAIN ATS (WOUND CARE) ×3 IMPLANT
TOWEL GREEN STERILE (TOWEL DISPOSABLE) ×3 IMPLANT
TOWEL GREEN STERILE FF (TOWEL DISPOSABLE) ×3 IMPLANT
TRAY FOLEY SLVR 16FR TEMP STAT (SET/KITS/TRAYS/PACK) ×3 IMPLANT
TUBING LAP HI FLOW INSUFFLATIO (TUBING) ×3 IMPLANT
UNDERPAD 30X36 HEAVY ABSORB (UNDERPADS AND DIAPERS) ×3 IMPLANT
WATER STERILE IRR 1000ML POUR (IV SOLUTION) ×6 IMPLANT

## 2021-05-17 NOTE — Procedures (Signed)
Extubation Procedure Note  Patient Details:   Name: Justin Carr DOB: 02-Jul-1945 MRN: [id number]   Airway Documentation:    Vent end date: 05/17/21 Vent end time: 1706   Evaluation  O2 sats: stable throughout Complications: No apparent complications Patient did tolerate procedure well. Bilateral Breath Sounds: Clear   Yes  Patient extubated to 4 L [place]. Patient had positive cuff leak prior to extubation. No stridor noted at this time. NIF -24 VC 0.5   Marchia Meiers 05/17/2021, 5:08 PM

## 2021-05-17 NOTE — Interval H&P Note (Signed)
History and Physical Interval Note:  05/17/2021 7:23 AM  Justin Carr  has presented today for surgery, with the diagnosis of CAD.  The various methods of treatment have been discussed with the patient and family. After consideration of risks, benefits and other options for treatment, the patient has consented to  Procedure(s): CORONARY ARTERY BYPASS GRAFTING (CABG) (N/A) TRANSESOPHAGEAL ECHOCARDIOGRAM (TEE) (N/A) as a surgical intervention.  The patient's history has been reviewed, patient examined, no change in status, stable for surgery.  I have reviewed the patient's chart and labs.  Questions were answered to the patient's satisfaction.     Saranda Legrande [name]

## 2021-05-17 NOTE — Progress Notes (Signed)
Pt unwilling to wear bipap. Pt will ask wife to bring in home unit tomorrow.

## 2021-05-17 NOTE — Progress Notes (Signed)
°  Echocardiogram Echocardiogram Transesophageal has been performed.  Bobbye Charleston 05/17/2021, 8:48 AM

## 2021-05-17 NOTE — Op Note (Signed)
[address]       [place] [address]             [phone]                                          05/17/2021 Patient:  Justin Carr Pre-Op Dx: Coronary artery disease   Diabetes mellitus   Hypertension   Hyperlipidemia Post-op Dx: Same Procedure: CABG X 3.  LIMA to LAD, reverse saphenous vein graft to obtuse marginal, PDA Endoscopic greater saphenous vein harvest on the right   Surgeon and Role:      * Hyatt Capobianco, Lucile Crater, MD - Primary    *M. Roddenberry, PA-C- assisting An experienced assistant was required given the complexity of this surgery and the standard of surgical care. The assistant was needed for exposure, dissection, suctioning, retraction of delicate tissues and sutures, instrument exchange and for overall help during this procedure.    Anesthesia  general EBL: 500 ml Blood Administration: None Xclamp Time: 65 min Pump Time: 92 min  Drains: 19 F blake drain: R, L, mediastinal  Wires: Ventricular Counts: correct   Indications: 76 year old male with left main, and proximal RCA disease.  Personally reviewed his left heart cath.  He has good targets in his LAD, circumflex and his RCA territories.  His echocardiogram shows preserved biventricular function and no significant valvular disease.  We discussed the risk and benefits of surgical revascularization and he is agreeable to proceed.   Findings: Good conduit, good LIMA.  Good sized LAD.  The PDA was a good target.  Obtuse marginal was also good target.  There were calcifications along the ascending aorta.  We will perform this under single cross-clamp.  Operative Technique: All invasive lines were placed in pre-op holding.  After the risks, benefits and alternatives were thoroughly discussed, the patient was brought to the operative theatre.  Anesthesia was induced, and the patient was prepped and draped in normal sterile fashion.  An appropriate surgical pause was performed, and  pre-operative antibiotics were dosed accordingly.  We began with simultaneous incisions along the right leg for harvesting of the greater saphenous vein and the chest for the sternotomy.  In regards to the sternotomy, this was carried down with bovie cautery, and the sternum was divided with a reciprocating saw.  Meticulous hemostasis was obtained.  The left internal thoracic artery was exposed and harvested in in pedicled fashion.  The patient was systemically heparinized, and the artery was divided distally, and placed in a papaverine sponge.    The sternal elevator was removed, and a retractor was placed.  The pericardium was divided in the midline and fashioned into a cradle with pericardial stitches.   After we confirmed an appropriate ACT, the ascending aorta was cannulated in standard fashion.  The right atrial appendage was used for venous cannulation site.  Cardiopulmonary bypass was initiated, and the heart retractor was placed. The cross clamp was applied, and a dose of anterograde cardioplegia was given with good arrest of the heart.  We moved to the posterior wall of the heart, and found a good target on the PDA.  An arteriotomy was made, and the vein graft was anastomosed to it in an end to side fashion.  Next we exposed the lateral wall, and found a good target on the obtuse marginal.  An end  to side anastomosis with the vein graft was then created.  Finally, we exposed a good target on the LAD, and fashioned an end to side anastomosis between it and the LITA.  We began to rewarm.  We then created 2 aortotomies and created our proximal anastomosis with the vein grafts.  Each were marked with rings.  A reanimation dose of cardioplegia was given and the heart was thoroughly de-aired.  The cross clamp was removed.  Meticulous hemostasis was obtained.    We separated from cardiopulmonary bypass without event.  The heparin was reversed with protamine.  Chest tubes and wires were placed, and the  sternum was re-approximated with sternal wires.  The soft tissue and skin were re-approximated wth absorbable suture.    The patient tolerated the procedure without any immediate complications, and was transferred to the ICU in guarded condition.  Daryon Remmert [name]

## 2021-05-17 NOTE — Progress Notes (Signed)
PT awake and alert. Pt requested to be placed on bipap for night. Started him 8/5, however pt was not tolerating. Placed on 5/5. Pt tolerating well. Will continue to monitor. No resp distress noted at this time.

## 2021-05-17 NOTE — Anesthesia Procedure Notes (Addendum)
Arterial Line Insertion Start/End2/22/2023 6:45 AM, 05/17/2021 6:55 AM Performed by: Lance Coon, CRNA, CRNA  Patient location: Pre-op. Preanesthetic checklist: patient identified, risks and benefits discussed and pre-op evaluation Left, radial was placed Catheter size: 20 G  Attempts: 1 Procedure performed without using ultrasound guided technique. Following insertion, Biopatch. Post procedure assessment: normal  Patient tolerated the procedure well with no immediate complications. Additional procedure comments: Placed by Lucita Ferrara, SRNA under supervision of CRNA.

## 2021-05-17 NOTE — Anesthesia Postprocedure Evaluation (Signed)
Anesthesia Post Note  Patient: Justin Carr  Procedure(s) Performed: CORONARY ARTERY BYPASS GRAFTING TIMES THREE USING LEFT INTERNAL  MAMMARY ARTERY AND RIGHT SAPHENOUS VEIN HARVESTED ENDOSCOPICALLY (Chest) TRANSESOPHAGEAL ECHOCARDIOGRAM (TEE) (Chest) APPLICATION OF CELL SAVER (Chest)     Patient location during evaluation: SICU Anesthesia Type: General Level of consciousness: patient remains intubated per anesthesia plan Pain management: pain level controlled Vital Signs Assessment: post-procedure vital signs reviewed and stable Respiratory status: patient remains intubated per anesthesia plan Cardiovascular status: stable Postop Assessment: no apparent nausea or vomiting Anesthetic complications: no   No notable events documented.  Last Vitals:  Vitals:   05/17/21 0712 05/17/21 1300  BP:  (!) 91/42  Pulse: 60 78  Resp: 11 12  Temp:    SpO2: 100% 99%    Last Pain:  Vitals:   05/17/21 0607  PainSc: 0-No pain                 Tashona Calk

## 2021-05-17 NOTE — Progress Notes (Signed)
Patients ABG did not meet criteria. MD notified and verbal order given to extubate and place on BiPAP if needed. Patient not in any distress at this time. BiPAP in room on standby.

## 2021-05-17 NOTE — Transfer of Care (Signed)
Immediate Anesthesia Transfer of Care Note  Patient: EVEREST BROD  Procedure(s) Performed: CORONARY ARTERY BYPASS GRAFTING TIMES THREE USING LEFT INTERNAL  MAMMARY ARTERY AND RIGHT SAPHENOUS VEIN HARVESTED ENDOSCOPICALLY (Chest) TRANSESOPHAGEAL ECHOCARDIOGRAM (TEE) (Chest) APPLICATION OF CELL SAVER (Chest)  Patient Location: PACU and ICU  Anesthesia Type:General  Level of Consciousness: sedated  Airway & Oxygen Therapy: Patient remains intubated per anesthesia plan  Post-op Assessment: Report given to RN and Post -op Vital signs reviewed and stable  Post vital signs: Reviewed and stable  Last Vitals:  Vitals Value Taken Time  BP    Temp    Pulse    Resp    SpO2      Last Pain:  Vitals:   05/17/21 0607  PainSc: 0-No pain         Complications: No notable events documented.

## 2021-05-17 NOTE — Anesthesia Procedure Notes (Addendum)
Central Venous Catheter Insertion Performed by: Belinda Block, MD, anesthesiologist Start/End2/22/2023 6:44 AM, 05/17/2021 7:05 AM Patient location: Pre-op. Preanesthetic checklist: patient identified, IV checked, site marked, risks and benefits discussed, surgical consent, monitors and equipment checked, pre-op evaluation, timeout performed and anesthesia consent Lidocaine 1% used for infiltration and patient sedated Hand hygiene performed  and maximum sterile barriers used  Catheter size: 8.5 Fr Sheath introducer Procedure performed using ultrasound guided technique. Ultrasound Notes:anatomy identified, needle tip was noted to be adjacent to the nerve/plexus identified, no ultrasound evidence of intravascular and/or intraneural injection and image(s) printed for medical record Attempts: 1 Following insertion, line sutured and dressing applied. Post procedure assessment: blood return through all ports, free fluid flow and no air  Patient tolerated the procedure well with no immediate complications. Additional procedure comments: Flow trac inserted through introducer without difficulty. Dr. Nyoka Cowden.

## 2021-05-17 NOTE — Brief Op Note (Signed)
05/17/2021  11:04 AM  PATIENT:  Justin Carr  76 y.o. male  PRE-OPERATIVE DIAGNOSIS:  CORONARY ARTERY DISEASE  POST-OPERATIVE DIAGNOSIS:  CORONARY ARTERY DISEASE  PROCEDURE:   CORONARY ARTERY BYPASS GRAFTING TIMES THREE USING LEFT INTERNAL  MAMMARY ARTERY AND RIGHT SAPHENOUS VEIN HARVESTED ENDOSCOPICALLY   LIMA-LAD SVG-OM SVG-PDA  Vein harvest time: 46min  Vein prep time: 55min  TRANSESOPHAGEAL ECHOCARDIOGRAM   APPLICATION OF CELL SAVER   SURGEON: Lightfoot, Lucile Crater, MD   PHYSICIAN ASSISTANT: Saja Bartolini  ASSISTANTS: Pelance, Bubba Hales, RN, RN First Assistant   ANESTHESIA:   general  EBL:  744ml   (580ml Cell Saver blood returned to patient)  BLOOD ADMINISTERED:none  DRAINS:  Bilateral pleural and mediastinal drains     LOCAL MEDICATIONS USED:  NONE  SPECIMEN:  No Specimen  DISPOSITION OF SPECIMEN:  N/A  COUNTS:  Correct  DICTATION:  Sales executive  PLAN OF CARE: Admit to inpatient   PATIENT DISPOSITION:  ICU - intubated and hemodynamically stable.   Delay start of Pharmacological VTE agent (>24hrs) due to surgical blood loss or risk of bleeding: yes

## 2021-05-17 NOTE — Care Management (Signed)
°  Transition of Care [facility]) Screening Note   Patient Details  Name: Justin Carr Date of Birth: 08/29/1945   [facility] CM/SW Contact:    Carles Collet, RN Phone Number: 05/17/2021, 3:44 PM    Transition of Care Department [facility]) has reviewed patient and we will continue to monitor patient advancement through interdisciplinary progression rounds. If new patient transition needs arise, please place a TOC consult.

## 2021-05-17 NOTE — Hospital Course (Addendum)
Referring: [name], MD Primary Care: Shirline Frees, MD  History of Present Illness:     76 year old male with history of coronary artery disease recently underwent a left heart cath which showed left main, and proximal RCA disease.  He denies any chest pain or shortness of breath, but has been experiencing some left arm paresthesias which is consistent with his previous symptomatology for his coronary artery disease.  Dr. Kipp Brood reviewed his left heart cath.  He was felt to have good targets in his LAD, circumflex and his RCA territories.  His echocardiogram showed preserved biventricular function and no significant valvular disease. Dr. Kipp Brood discussed the risk and benefits of surgical revascularization and Justin Carr agreed to proceed.   Hospital Course: Justin Carr was admitted for elective surgery on 05/17/2021.  He was taken to the operating room where three-vessel coronary bypass grafting was carried out without complication.  The left internal mammary artery was grafted to the left anterior descending coronary artery.  Separate saphenous vein grafts were placed to the obtuse marginal and posterior descending coronary arteries.  There was severe calcification of the proximal aorta encountered at the time of surgery.  The case was done utilizing a single cross-clamp technique.  Following the procedure, he separated from cardiopulmonary bypass without any difficulty.  He was transferred to the surgical ICU in stable condition.  The patient was extubated the evening of surgery.  He was weaned off Neo-synephrine as hemodynamics allowed.  He was in NSR with 1st degree AV Block.  Vital signs and cardiac rhythm remained stable.  By the second postoperative day, he was ready for transfer from the ICU to 4E progressive care.  Chest tubes and pacer wires were removed prior to transfer.  Diet and activity were advanced and well-tolerated.  On postoperative day #3 he developed atrial fibrillation with  RVR and was started on oral amiodarone.  Beta-blocker was also titrated.  The patient had additional A. Fib and was treated with additional bolus of Amiodarone.  He converted to NSR, however his CHADs Vasc score is 3 which places him in high risk category for event.  He will be started on Eliquis at discharge.  He was hypertensive and his home Altace was resumed.  He is ambulating independently.  His incisions are healing without evidence of infection.  He is medically stable for discharge home today.

## 2021-05-17 NOTE — Anesthesia Preprocedure Evaluation (Signed)
Anesthesia Evaluation  Patient identified by MRN, date of birth, ID band Patient awake    Airway Mallampati: II  TM Distance: >3 FB     Dental   Pulmonary sleep apnea , pneumonia, former smoker,    breath sounds clear to auscultation       Cardiovascular hypertension, + angina + CAD, + Past MI and + Peripheral Vascular Disease   Rhythm:Regular Rate:Normal     Neuro/Psych    GI/Hepatic Neg liver ROS, GERD  ,  Endo/Other  diabetes  Renal/GU negative Renal ROS     Musculoskeletal   Abdominal   Peds  Hematology   Anesthesia Other Findings   Reproductive/Obstetrics                             Anesthesia Physical Anesthesia Plan  ASA: 3  Anesthesia Plan: General   Post-op Pain Management:    Induction: Intravenous  PONV Risk Score and Plan: 2 and Ondansetron, Midazolam and Treatment may vary due to age or medical condition  Airway Management Planned: Oral ETT  Additional Equipment:   Intra-op Plan:   Post-operative Plan: Post-operative intubation/ventilation  Informed Consent: I have reviewed the patients History and Physical, chart, labs and discussed the procedure including the risks, benefits and alternatives for the proposed anesthesia with the patient or authorized representative who has indicated his/her understanding and acceptance.     Dental advisory given  Plan Discussed with: CRNA and Anesthesiologist  Anesthesia Plan Comments:         Anesthesia Quick Evaluation

## 2021-05-17 NOTE — Discharge Instructions (Addendum)
Discharge Instructions:  1. You may shower, please wash incisions daily with soap and water and keep dry.  If you wish to cover wounds with dressing you may do so but please keep clean and change daily.  No tub baths or swimming until incisions have completely healed.  If your incisions become red or develop any drainage please call our office at [phone]  2. No Driving until cleared by Dr. Lightfoot's office and you are no longer using narcotic pain medications  3. Monitor your weight daily.. Please use the same scale and weigh at same time... If you gain 5-10 lbs in 48 hours with associated lower extremity swelling, please contact our office at [phone]  4. Fever of 101.5 for at least 24 hours with no source, please contact our office at [phone]  5. Activity- up as tolerated, please walk at least 3 times per day.  Avoid strenuous activity, no lifting, pushing, or pulling with your arms over 8-10 lbs for a minimum of 6 weeks  6. If any questions or concerns arise, please do not hesitate to contact our office at [phone] =============================================== Information on my medicine - ELIQUIS (apixaban)  This medication education was reviewed with me or my healthcare representative as part of my discharge preparation.   Why was Eliquis prescribed for you? Eliquis was prescribed for you to reduce the risk of forming blood clots that can cause a stroke if you have a medical condition called atrial fibrillation (a type of irregular heartbeat) OR to reduce the risk of a blood clots forming after orthopedic surgery.  What do You need to know about Eliquis ? Take your Eliquis TWICE DAILY - one tablet in the morning and one tablet in the evening with or without food.  It would be best to take the doses about the same time each day.  If you have difficulty swallowing the tablet whole please discuss with your pharmacist how to take the medication safely.  Take  Eliquis exactly as prescribed by your doctor and DO NOT stop taking Eliquis without talking to the doctor who prescribed the medication.  Stopping may increase your risk of developing a new clot or stroke.  Refill your prescription before you run out.  After discharge, you should have regular check-up appointments with your healthcare provider that is prescribing your Eliquis.  In the future your dose may need to be changed if your kidney function or weight changes by a significant amount or as you get older.  What do you do if you miss a dose? If you miss a dose, take it as soon as you remember on the same day and resume taking twice daily.  Do not take more than one dose of ELIQUIS at the same time.  Important Safety Information A possible side effect of Eliquis is bleeding. You should call your healthcare provider right away if you experience any of the following: Bleeding from an injury or your nose that does not stop. Unusual colored urine (red or dark brown) or unusual colored stools (red or black). Unusual bruising for unknown reasons. A serious fall or if you hit your head (even if there is no bleeding).  Some medicines may interact with Eliquis and might increase your risk of bleeding or clotting while on Eliquis. To help avoid this, consult your healthcare provider or pharmacist prior to using any new prescription or non-prescription medications, including herbals, vitamins, non-steroidal anti-inflammatory drugs (NSAIDs) and supplements.  This website has more information on Eliquis (apixaban): 

## 2021-05-17 NOTE — Anesthesia Procedure Notes (Signed)
Procedure Name: Intubation Date/Time: 05/17/2021 7:49 AM Performed by: Lance Coon, CRNA Pre-anesthesia Checklist: Patient identified, Emergency Drugs available, Suction available and Patient being monitored Patient Re-evaluated:Patient Re-evaluated prior to induction Oxygen Delivery Method: Circle System Utilized Preoxygenation: Pre-oxygenation with 100% oxygen Induction Type: IV induction Ventilation: Mask ventilation without difficulty Laryngoscope Size: Mac and 4 Grade View: Grade II Tube type: Oral Number of attempts: 1 Airway Equipment and Method: Stylet Placement Confirmation: ETT inserted through vocal cords under direct vision, positive ETCO2 and breath sounds checked- equal and bilateral Secured at: 24 cm Tube secured with: Tape Dental Injury: Teeth and Oropharynx as per pre-operative assessment  Comments: Placed by Lucita Ferrara, SRNA under MDA and CRNA supervision

## 2021-05-18 ENCOUNTER — Inpatient Hospital Stay (HOSPITAL_COMMUNITY): Payer: Medicare Other

## 2021-05-18 ENCOUNTER — Encounter (HOSPITAL_COMMUNITY): Payer: Self-pay | Admitting: Thoracic Surgery (Cardiothoracic Vascular Surgery)

## 2021-05-18 LAB — BASIC METABOLIC PANEL
Anion gap: 4 — ABNORMAL LOW (ref 5–15)
Anion gap: 6 (ref 5–15)
BUN: 13 mg/dL (ref 8–23)
BUN: 17 mg/dL (ref 8–23)
CO2: 25 mmol/L (ref 22–32)
CO2: 25 mmol/L (ref 22–32)
Calcium: 7.5 mg/dL — ABNORMAL LOW (ref 8.9–10.3)
Calcium: 7.5 mg/dL — ABNORMAL LOW (ref 8.9–10.3)
Chloride: 108 mmol/L (ref 98–111)
Chloride: 103 mmol/L (ref 98–111)
Creatinine, Ser: 0.98 mg/dL (ref 0.61–1.24)
Creatinine, Ser: 1.01 mg/dL (ref 0.61–1.24)
GFR, Estimated: >60 mL/min (ref >60)
GFR, Estimated: >60 mL/min (ref >60)
Glucose, Bld: 121 mg/dL — ABNORMAL HIGH (ref 70–99)
Glucose, Bld: 121 mg/dL — ABNORMAL HIGH (ref 70–99)
Potassium: 4.6 mmol/L (ref 3.5–5.1)
Potassium: 4 mmol/L (ref 3.5–5.1)
Sodium: 137 mmol/L (ref 135–145)
Sodium: 134 mmol/L — ABNORMAL LOW (ref 135–145)

## 2021-05-18 LAB — GLUCOSE, CAPILLARY
Glucose-Capillary: 108 mg/dL — ABNORMAL HIGH (ref 70–99)
Glucose-Capillary: 111 mg/dL — ABNORMAL HIGH (ref 70–99)
Glucose-Capillary: 114 mg/dL — ABNORMAL HIGH (ref 70–99)
Glucose-Capillary: 118 mg/dL — ABNORMAL HIGH (ref 70–99)
Glucose-Capillary: 124 mg/dL — ABNORMAL HIGH (ref 70–99)
Glucose-Capillary: 131 mg/dL — ABNORMAL HIGH (ref 70–99)
Glucose-Capillary: 140 mg/dL — ABNORMAL HIGH (ref 70–99)
Glucose-Capillary: 144 mg/dL — ABNORMAL HIGH (ref 70–99)
Glucose-Capillary: 160 mg/dL — ABNORMAL HIGH (ref 70–99)
Glucose-Capillary: 213 mg/dL — ABNORMAL HIGH (ref 70–99)
Glucose-Capillary: 218 mg/dL — ABNORMAL HIGH (ref 70–99)

## 2021-05-18 LAB — CBC
HCT: 33.1 % — ABNORMAL LOW (ref 39.0–52.0)
HCT: 32.9 % — ABNORMAL LOW (ref 39.0–52.0)
Hemoglobin: 11.2 g/dL — ABNORMAL LOW (ref 13.0–17.0)
Hemoglobin: 11.3 g/dL — ABNORMAL LOW (ref 13.0–17.0)
MCH: 33.1 pg (ref 26.0–34.0)
MCH: 34 pg (ref 26.0–34.0)
MCHC: 33.8 g/dL (ref 30.0–36.0)
MCHC: 34.3 g/dL (ref 30.0–36.0)
MCV: 97.9 fL (ref 80.0–100.0)
MCV: 99.1 fL (ref 80.0–100.0)
Platelets: 145 10*3/uL — ABNORMAL LOW (ref 150–400)
Platelets: 128 10*3/uL — ABNORMAL LOW (ref 150–400)
RBC: 3.38 MIL/uL — ABNORMAL LOW (ref 4.22–5.81)
RBC: 3.32 MIL/uL — ABNORMAL LOW (ref 4.22–5.81)
RDW: 13.3 % (ref 11.5–15.5)
RDW: 13.7 % (ref 11.5–15.5)
WBC: 13.7 10*3/uL — ABNORMAL HIGH (ref 4.0–10.5)
WBC: 11.1 10*3/uL — ABNORMAL HIGH (ref 4.0–10.5)
nRBC: 0 % (ref 0.0–0.2)
nRBC: 0 % (ref 0.0–0.2)

## 2021-05-18 LAB — MAGNESIUM
Magnesium: 2.7 mg/dL — ABNORMAL HIGH (ref 1.7–2.4)
Magnesium: 2.5 mg/dL — ABNORMAL HIGH (ref 1.7–2.4)

## 2021-05-18 MED ORDER — INSULIN ASPART 100 UNIT/ML IJ SOLN
0.0000 [IU] | INTRAMUSCULAR | Status: DC
  Administered 2021-05-18: 8 [IU] via SUBCUTANEOUS
  Administered 2021-05-18 – 2021-05-19 (×2): 2 [IU] via SUBCUTANEOUS
  Administered 2021-05-19 (×2): 4 [IU] via SUBCUTANEOUS
  Administered 2021-05-19 – 2021-05-21 (×7): 2 [IU] via SUBCUTANEOUS

## 2021-05-18 MED ORDER — INSULIN ASPART 100 UNIT/ML IJ SOLN
0.0000 [IU] | INTRAMUSCULAR | Status: DC
  Administered 2021-05-18: 5 [IU] via SUBCUTANEOUS

## 2021-05-18 MED ORDER — PREGABALIN 100 MG PO CAPS
100.0000 mg | ORAL_CAPSULE | Freq: Two times a day (BID) | ORAL | Status: DC
  Administered 2021-05-18 – 2021-05-22 (×9): 100 mg via ORAL
  Filled 2021-05-18 (×2): qty 1
  Filled 2021-05-18: qty 2
  Filled 2021-05-18: qty 1
  Filled 2021-05-18: qty 2
  Filled 2021-05-18 (×3): qty 1
  Filled 2021-05-18: qty 2

## 2021-05-18 MED ORDER — ENOXAPARIN SODIUM 40 MG/0.4ML IJ SOSY
40.0000 mg | PREFILLED_SYRINGE | Freq: Every day | INTRAMUSCULAR | Status: DC
  Administered 2021-05-18 – 2021-05-21 (×4): 40 mg via SUBCUTANEOUS
  Filled 2021-05-18 (×4): qty 0.4

## 2021-05-18 MED FILL — Calcium Chloride Inj 10%: INTRAVENOUS | Qty: 10 | Status: AC

## 2021-05-18 MED FILL — Mannitol IV Soln 20%: INTRAVENOUS | Qty: 500 | Status: AC

## 2021-05-18 MED FILL — Sodium Chloride IV Soln 0.9%: INTRAVENOUS | Qty: 2000 | Status: AC

## 2021-05-18 MED FILL — Electrolyte-R (PH 7.4) Solution: INTRAVENOUS | Qty: 4000 | Status: AC

## 2021-05-18 MED FILL — Albumin, Human Inj 5%: INTRAVENOUS | Qty: 250 | Status: AC

## 2021-05-18 MED FILL — Heparin Sodium (Porcine) Inj 1000 Unit/ML: Qty: 1000 | Status: AC

## 2021-05-18 MED FILL — Potassium Chloride Inj 2 mEq/ML: INTRAVENOUS | Qty: 40 | Status: AC

## 2021-05-18 MED FILL — Heparin Sodium (Porcine) Inj 1000 Unit/ML: INTRAMUSCULAR | Qty: 10 | Status: AC

## 2021-05-18 MED FILL — Lidocaine HCl Local Preservative Free (PF) Inj 2%: INTRAMUSCULAR | Qty: 15 | Status: AC

## 2021-05-18 MED FILL — Sodium Bicarbonate IV Soln 8.4%: INTRAVENOUS | Qty: 50 | Status: AC

## 2021-05-18 NOTE — Discharge Summary (Cosign Needed)
[address]       [place] [address]             [phone]    Physician Discharge Summary  Patient ID: Justin Carr MRN: [id number] DOB/AGE: [birth year] 76 y.o.  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   Right inguinal hernia 01/05/2018   CAD (coronary artery disease) 04/11/2017   Stable angina (HCC)    Abnormal nuclear stress test    Diabetic neuropathy ([place]) 04/20/2015   Paresthesia 03/23/2015   Palpitations 03/24/2014   Coronary artery disease 04/21/2013   Essential hypertension 04/21/2013   Hyperlipidemia 04/21/2013   Diabetes ([place]) 04/21/2013   Obstructive sleep apnea 04/21/2013   Renal artery stenosis ([place]) 04/21/2013   Discharge Diagnoses:  Patient Active Problem List   Diagnosis Date Noted   S/P CABG x 3 05/17/2021   Preoperative clearance 03/07/2021   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   Right inguinal hernia 01/05/2018   CAD (coronary artery disease) 04/11/2017   Stable angina (HCC)    Abnormal nuclear stress test    Diabetic neuropathy ([place]) 04/20/2015   Paresthesia 03/23/2015   Palpitations 03/24/2014   Coronary artery disease 04/21/2013   Essential hypertension 04/21/2013   Hyperlipidemia 04/21/2013   Diabetes ([place]) 04/21/2013   Obstructive sleep apnea 04/21/2013   Renal artery stenosis ([place]) 04/21/2013   Discharged Condition: good  Referring: [name], MD Primary Care: Shirline Frees, MD  Referring: [name], MD Primary Care: Shirline Frees, MD  History of Present Illness:     76 year old male with history of coronary artery disease recently underwent a left heart cath which showed left main, and proximal RCA disease.  He denies any chest pain or shortness of breath, but has been experiencing some left arm paresthesias which is consistent with his  previous symptomatology for his coronary artery disease.  Dr. Kipp Brood reviewed his left heart cath.  He was felt to have good targets in his LAD, circumflex and his RCA territories.  His echocardiogram showed preserved biventricular function and no significant valvular disease. Dr. Kipp Brood discussed the risk and benefits of surgical revascularization and Mr. [name] agreed to proceed.   Hospital Course: Mr. Crull was admitted for elective surgery on 05/17/2021.  He was taken to the operating room where three-vessel coronary bypass grafting was carried out without complication.  The left internal mammary artery was grafted to the left anterior descending coronary artery.  Separate saphenous vein grafts were placed to the obtuse marginal and posterior descending coronary arteries.  There was severe calcification of the proximal aorta encountered at the time of surgery.  The case was done utilizing a single cross-clamp technique.  Following the procedure, he separated from cardiopulmonary bypass without any difficulty.  He was transferred to the surgical ICU in stable condition.  The patient was extubated the evening of surgery.  He was weaned off Neo-synephrine as hemodynamics allowed.  He was in NSR with 1st degree AV Block.  Vital signs and cardiac rhythm remained stable.  By the second postoperative day, he was ready for transfer from the ICU to 4E progressive care.  Chest tubes and pacer wires were removed prior to transfer.  Diet and activity were advanced and well-tolerated.  On postoperative day #3 he developed atrial fibrillation with RVR and was started on oral amiodarone.  Beta-blocker  was also titrated.  The patient had additional A. Fib and was treated with additional bolus of Amiodarone.  He converted to NSR, however his CHADs Vasc score is 3 which places him in high risk category for event.  He will be started on Eliquis at discharge.  He was hypertensive and his home Altace was resumed.  He is  ambulating independently.  His incisions are healing without evidence of infection.  He is medically stable for discharge home today.  Consults: None  Significant Diagnostic Studies: angiography:     Prox RCA lesion is 75% stenosed.   Ost LM to Mid LM lesion is 70% stenosed.   Mid LAD lesion is 30% stenosed.   Previously placed Prox LAD to Mid LAD stent (unknown type) is  widely patent.   The left ventricular systolic function is normal.   LV end diastolic pressure is normal.   The left ventricular ejection fraction is 50-55% by visual estimate.  Treatments: surgery:   05/17/2021 Patient:  [name] Pre-Op Dx: Coronary artery disease                         Diabetes mellitus                         Hypertension                         Hyperlipidemia Post-op Dx: Same Procedure: CABG X 3.  LIMA to LAD, reverse saphenous vein graft to obtuse marginal, PDA Endoscopic greater saphenous vein harvest on the right     Surgeon and Role:      * Lightfoot, Lucile Crater, MD - Primary    *M. Roddenberry, PA-C- assisting An experienced assistant was required given the complexity of this surgery and the standard of surgical care. The assistant was needed for exposure, dissection, suctioning, retraction of delicate tissues and sutures, instrument exchange and for overall help during this procedure.    Discharge Exam: Blood pressure (!) 146/71, pulse 74, temperature 98.7 F (37.1 C), temperature source Oral, resp. rate 17, weight 95.9 kg, SpO2 94 %.  General appearance: alert, cooperative, and no distress Heart: regular rate and rhythm Lungs: clear to auscultation bilaterally Abdomen: soft, non-tender; bowel sounds normal; no masses,  no organomegaly Extremities: edema trace Wound: clean and dry   Discharge Medications:  The patient has been discharged on:   1.Beta Blocker:  Yes [  x ]                              No   [   ]                              If No, reason:  2.Ace  Inhibitor/ARB: Yes [ x  ]                                     No  [    ]                                     If No, reason:  3.Statin:   Yes [   ]  No  [  X ]                  If No, reason: intolerant  4.[place]:  Yes  [ x  ]                  No   [   ]                  If No, reason:  Patient had ACS upon admission:  Plavix/P2Y12 inhibitor: Yes [   ]                                      No  [ x  ]   Discharge Instructions     Amb Referral to Cardiac Rehabilitation   Complete by: As directed    Diagnosis: CABG   CABG X ___: 3   After initial evaluation and assessments completed: Virtual Based Care may be provided alone or in conjunction with Phase 2 Cardiac Rehab based on patient barriers.: Yes      Allergies as of 05/22/2021       Reactions   Peanuts [peanut Oil] Anaphylaxis   Lipitor [atorvastatin] Other (See Comments)   Muscle cramps   Rosuvastatin Other (See Comments)   Muscle cramps if taken daily-pt still takes drug, but takes 3 times a week.        Medication List     TAKE these medications    acetaminophen 500 MG tablet Commonly known as: TYLENOL Take 1-2 tablets (500-1,000 mg total) by mouth every 6 (six) hours as needed for mild pain or fever.   Alpha-Lipoic Acid 300 MG Caps Take 300 mg by mouth in the morning and at bedtime.   amiodarone 200 MG tablet Commonly known as: PACERONE Take 2 tablets (400 mg total) by mouth daily. X 7 days, then decrease to 200 mg BID x 7 days, then decrease to 200 mg daily   apixaban 5 MG Tabs tablet Commonly known as: ELIQUIS Take 1 tablet (5 mg total) by mouth 2 (two) times daily.   aspirin 81 MG tablet Take 1 tablet (81 mg total) by mouth daily. What changed: when to take this   COQ-10 PO Take 1 capsule by mouth at bedtime.   dapagliflozin propanediol 5 MG Tabs tablet Commonly known as: FARXIGA Take 5 mg by mouth daily.   famotidine 20 MG tablet Commonly known as: Pepcid Take 1 tablet (20  mg total) by mouth 2 (two) times daily.   furosemide 40 MG tablet Commonly known as: LASIX Take 1 tablet (40 mg total) by mouth daily.   metFORMIN 500 MG tablet Commonly known as: GLUCOPHAGE Take 500 mg by mouth 2 (two) times daily.   metoprolol tartrate 25 MG tablet Commonly known as: LOPRESSOR Take 1 tablet (25 mg total) by mouth 2 (two) times daily.   multivitamin with minerals Tabs tablet Take 1 tablet by mouth daily.   nitroGLYCERIN 0.4 MG SL tablet Commonly known as: NITROSTAT DISSOLVE ONE TABLET UNDER THE TONGUE EVERY 5 MINUTES AS NEEDED FOR CHEST PAIN.  DO NOT EXCEED A TOTAL OF 3 DOSES IN 15 MINUTES   potassium chloride SA 20 MEQ tablet Commonly known as: KLOR-CON M Take 1 tablet (20 mEq total) by mouth daily. Start taking on: May 23, 2021   pregabalin 100 MG capsule Commonly known as: LYRICA Take 100 mg by  mouth 2 (two) times daily.   PRESERVISION AREDS PO Take 1 capsule by mouth 2 (two) times daily.   ramipril 10 MG capsule Commonly known as: ALTACE Take 1 capsule by mouth once daily What changed: when to take this   rosuvastatin 20 MG tablet Commonly known as: CRESTOR TAKE 1 TABLET BY MOUTH THREE TIMES A WEEK   sildenafil 100 MG tablet Commonly known as: VIAGRA Take 100 mg by mouth daily as needed for erectile dysfunction.   traMADol 50 MG tablet Commonly known as: ULTRAM Take 1 tablet (50 mg total) by mouth every 4 (four) hours as needed for moderate pain.   Trulicity 6.97 XY/8.0XK Sopn Generic drug: Dulaglutide Inject 0.75 mg into the skin every Monday.   Vitamin C 500 MG Caps Take 500 mg by mouth daily.   Vitamin D 50 MCG (2000 UT) tablet Take 2,000 Units by mouth daily.        Follow-up Information     Lajuana Matte, MD Follow up on 05/26/2021.   Specialty: Cardiothoracic Surgery Why: Appointment is at 3:40 Contact information: [place] [address] [phone]         Lendon Colonel,  NP. Go on 06/09/2021.   Specialties: Nurse Practitioner, Radiology, Cardiology Why: Your appointment is at 10:05. Contact information: [address] [place] [address] [phone]                 Signed:  Ellwood Handler, PA-C 05/22/2021, 8:43 AM

## 2021-05-18 NOTE — Progress Notes (Signed)
Patient ID: Justin Carr, male   DOB: Sep 08, 1945, 76 y.o.   MRN: [id number]  TCTS Evening Rounds:  Hemodynamically stable in sinus rhythm.  Sats 100%  UO ok  CT output low.  Ambulated today.

## 2021-05-18 NOTE — Progress Notes (Signed)
° °   °  [address]       [place] [address]             [phone]                 1 Day Post-Op Procedure(s) (LRB): CORONARY ARTERY BYPASS GRAFTING TIMES THREE USING LEFT INTERNAL  MAMMARY ARTERY AND RIGHT SAPHENOUS VEIN HARVESTED ENDOSCOPICALLY (N/A) TRANSESOPHAGEAL ECHOCARDIOGRAM (TEE) (N/A) APPLICATION OF CELL SAVER (N/A)   Events: No events _______________________________________________________________ Vitals: BP (!) 132/48    Pulse 73    Temp 99.7 F (37.6 C)    Resp (!) 26    SpO2 94%  There were no vitals filed for this visit.   - Neuro: alert NAD  - Cardiovascular: sinus  Drips: neo 60.   CVP:  [0 mmHg-21 mmHg] 21 mmHg  - Pulm: EWOB Vent Mode: CPAP;PSV FiO2 (%):  [40 %-50 %] 40 % Set Rate:  [4 bmp-12 bmp] 4 bmp Vt Set:  [600 mL] 600 mL PEEP:  [5 cmH20] 5 cmH20 Pressure Support:  [10 cmH20] 10 cmH20  ABG    Component Value Date/Time   PHART 7.216 (L) 05/17/2021 1837   PCO2ART 54.6 (H) 05/17/2021 1837   PO2ART 69 (L) 05/17/2021 1837   HCO3 22.1 05/17/2021 1837   TCO2 24 05/17/2021 1837   ACIDBASEDEF 6.0 (H) 05/17/2021 1837   O2SAT 89 05/17/2021 1837    - Abd: ND - Extremity: warm  .Intake/Output      02/22 0701 02/23 0700 02/23 0701 02/24 0700   P.O. 90    I.V. 2440.6    Blood 495    IV Piggyback 2436    Total Intake 5874.6    Urine 3765    Blood 743    Chest Tube 790    Total Output 5298    Net +576.6            _______________________________________________________________ Labs: CBC Latest Ref Rng & Units 05/18/2021 05/17/2021 05/17/2021  WBC 4.0 - 10.5 K/uL 13.7(H) 16.1(H) -  Hemoglobin 13.0 - 17.0 g/dL 11.2(L) 13.2 12.6(L)  Hematocrit 39.0 - 52.0 % 33.1(L) 38.1(L) 37.0(L)  Platelets 150 - 400 K/uL 145(L) 149(L) -   CMP Latest Ref Rng & Units 05/18/2021 05/17/2021 05/17/2021  Glucose 70 - 99 mg/dL 121(H) 163(H) -  BUN 8 - 23 mg/dL 13 13 -  Creatinine 0.61 - 1.24 mg/dL 0.98 0.93 -  Sodium 135 - 145 mmol/L 137 140  141  Potassium 3.5 - 5.1 mmol/L 4.6 4.3 4.4  Chloride 98 - 111 mmol/L 108 110 -  CO2 22 - 32 mmol/L 25 22 -  Calcium 8.9 - 10.3 mg/dL 7.5(L) 7.7(L) -  Total Protein 6.5 - 8.1 g/dL - - -  Total Bilirubin 0.3 - 1.2 mg/dL - - -  Alkaline Phos 38 - 126 U/L - - -  AST 15 - 41 U/L - - -  ALT 0 - 44 U/L - - -    CXR: PV congestion  _______________________________________________________________  Assessment and Plan: POD 1 s/p CABG  Neuro: pain controlled CV: weaning neo.  Will remove A line.  Will keep wires given 1st degree AV block. Pulm: pulm hyg Renal: creat stable GI: on diet Heme: stable ID: afebrile Endo: SSI Dispo: continue ICU care   Justin Carr 05/18/2021 8:08 AM

## 2021-05-19 ENCOUNTER — Inpatient Hospital Stay (HOSPITAL_COMMUNITY): Payer: Medicare Other

## 2021-05-19 LAB — BASIC METABOLIC PANEL
Anion gap: 4 — ABNORMAL LOW (ref 5–15)
BUN: 15 mg/dL (ref 8–23)
CO2: 26 mmol/L (ref 22–32)
Calcium: 7.7 mg/dL — ABNORMAL LOW (ref 8.9–10.3)
Chloride: 106 mmol/L (ref 98–111)
Creatinine, Ser: 0.95 mg/dL (ref 0.61–1.24)
GFR, Estimated: >60 mL/min (ref >60)
Glucose, Bld: 145 mg/dL — ABNORMAL HIGH (ref 70–99)
Potassium: 4 mmol/L (ref 3.5–5.1)
Sodium: 136 mmol/L (ref 135–145)

## 2021-05-19 LAB — GLUCOSE, CAPILLARY
Glucose-Capillary: 144 mg/dL — ABNORMAL HIGH (ref 70–99)
Glucose-Capillary: 191 mg/dL — ABNORMAL HIGH (ref 70–99)
Glucose-Capillary: 139 mg/dL — ABNORMAL HIGH (ref 70–99)
Glucose-Capillary: 131 mg/dL — ABNORMAL HIGH (ref 70–99)
Glucose-Capillary: 174 mg/dL — ABNORMAL HIGH (ref 70–99)

## 2021-05-19 LAB — CBC
HCT: 32 % — ABNORMAL LOW (ref 39.0–52.0)
Hemoglobin: 10.5 g/dL — ABNORMAL LOW (ref 13.0–17.0)
MCH: 33.2 pg (ref 26.0–34.0)
MCHC: 32.8 g/dL (ref 30.0–36.0)
MCV: 101.3 fL — ABNORMAL HIGH (ref 80.0–100.0)
Platelets: 118 10*3/uL — ABNORMAL LOW (ref 150–400)
RBC: 3.16 MIL/uL — ABNORMAL LOW (ref 4.22–5.81)
RDW: 13.7 % (ref 11.5–15.5)
WBC: 9.4 10*3/uL (ref 4.0–10.5)
nRBC: 0 % (ref 0.0–0.2)

## 2021-05-19 LAB — LIPID PANEL
Cholesterol: 73 mg/dL (ref 0–200)
HDL: 25 mg/dL — ABNORMAL LOW (ref >40)
LDL Cholesterol: 29 mg/dL (ref 0–99)
Total CHOL/HDL Ratio: 2.9 RATIO
Triglycerides: 96 mg/dL (ref <150)
VLDL: 19 mg/dL (ref 0–40)

## 2021-05-19 MED ORDER — SODIUM CHLORIDE 0.9% FLUSH: 3.0000 mL | INTRAVENOUS | Status: DC | PRN

## 2021-05-19 MED ORDER — SODIUM CHLORIDE 0.9 % IV SOLN: 250.0000 mL | INTRAVENOUS | Status: DC | PRN

## 2021-05-19 MED ORDER — AMIODARONE IV BOLUS ONLY 150 MG/100ML
150.0000 mg | Freq: Once | INTRAVENOUS | Status: AC
  Administered 2021-05-19: 150 mg via INTRAVENOUS
  Filled 2021-05-19: qty 100

## 2021-05-19 MED ORDER — POTASSIUM CHLORIDE CRYS ER 20 MEQ PO TBCR
40.0000 meq | EXTENDED_RELEASE_TABLET | Freq: Every day | ORAL | Status: DC
  Administered 2021-05-19 – 2021-05-22 (×4): 40 meq via ORAL
  Filled 2021-05-19 (×4): qty 2

## 2021-05-19 MED ORDER — AMIODARONE HCL 200 MG PO TABS
400.0000 mg | ORAL_TABLET | Freq: Every day | ORAL | Status: DC
  Administered 2021-05-19 – 2021-05-22 (×4): 400 mg via ORAL
  Filled 2021-05-19 (×4): qty 2

## 2021-05-19 MED ORDER — ~~LOC~~ CARDIAC SURGERY, PATIENT & FAMILY EDUCATION: Freq: Once | Status: AC

## 2021-05-19 MED ORDER — FUROSEMIDE 10 MG/ML IJ SOLN
40.0000 mg | Freq: Once | INTRAMUSCULAR | Status: AC
  Administered 2021-05-19: 40 mg via INTRAVENOUS
  Filled 2021-05-19: qty 4

## 2021-05-19 MED ORDER — FUROSEMIDE 40 MG PO TABS
40.0000 mg | ORAL_TABLET | Freq: Every day | ORAL | Status: DC
  Administered 2021-05-20 – 2021-05-22 (×3): 40 mg via ORAL
  Filled 2021-05-19 (×3): qty 1

## 2021-05-19 MED ORDER — DAPAGLIFLOZIN PROPANEDIOL 5 MG PO TABS
5.0000 mg | ORAL_TABLET | Freq: Every day | ORAL | Status: DC
  Administered 2021-05-19 – 2021-05-22 (×4): 5 mg via ORAL
  Filled 2021-05-19 (×4): qty 1

## 2021-05-19 MED ORDER — SODIUM CHLORIDE 0.9% FLUSH
3.0000 mL | Freq: Two times a day (BID) | INTRAVENOUS | Status: DC
  Administered 2021-05-20 – 2021-05-21 (×4): 3 mL via INTRAVENOUS

## 2021-05-19 NOTE — Progress Notes (Signed)
Pt had not voided since foley removal.  Bladder scan showed 875 ml. Pt unable to use urinal. Voided large amount of urine into commode and feels relief. Pt stated that he he was not able to void earlier sitting on side of bed. :) Pt sitting in chair with call bell and phone within reach. Pt calling his wife with update. Payton Emerald, RN

## 2021-05-19 NOTE — Progress Notes (Signed)
° °   °  [address]       [place] [address]             [phone]                 2 Days Post-Op Procedure(s) (LRB): CORONARY ARTERY BYPASS GRAFTING TIMES THREE USING LEFT INTERNAL  MAMMARY ARTERY AND RIGHT SAPHENOUS VEIN HARVESTED ENDOSCOPICALLY (N/A) TRANSESOPHAGEAL ECHOCARDIOGRAM (TEE) (N/A) APPLICATION OF CELL SAVER (N/A)   Events: No events _______________________________________________________________ Vitals: BP (!) 97/57    Pulse 73    Temp 99.3 F (37.4 C)    Resp 17    Wt 100.9 kg    SpO2 96%    BMI 31.02 kg/m  Filed Weights   05/18/21 0500 05/19/21 0500  Weight: 99.9 kg 100.9 kg     - Neuro: alert NAD  - Cardiovascular: sinus  Drips: none CVP:  [3 mmHg-13 mmHg] 9 mmHg  - Pulm: EWOB    ABG    Component Value Date/Time   PHART 7.216 (L) 05/17/2021 1837   PCO2ART 54.6 (H) 05/17/2021 1837   PO2ART 69 (L) 05/17/2021 1837   HCO3 22.1 05/17/2021 1837   TCO2 24 05/17/2021 1837   ACIDBASEDEF 6.0 (H) 05/17/2021 1837   O2SAT 89 05/17/2021 1837    - Abd: ND - Extremity: warm  .Intake/Output      02/23 0701 02/24 0700 02/24 0701 02/25 0700   P.O. 960    I.V. (mL/kg) 119.2 (1.2)    Blood     IV Piggyback 200    Total Intake(mL/kg) 1279.2 (12.7)    Urine (mL/kg/hr) 815 (0.3)    Blood     Chest Tube 400    Total Output 1215    Net +64.2            _______________________________________________________________ Labs: CBC Latest Ref Rng & Units 05/19/2021 05/18/2021 05/18/2021  WBC 4.0 - 10.5 K/uL 9.4 11.1(H) 13.7(H)  Hemoglobin 13.0 - 17.0 g/dL 10.5(L) 11.3(L) 11.2(L)  Hematocrit 39.0 - 52.0 % 32.0(L) 32.9(L) 33.1(L)  Platelets 150 - 400 K/uL 118(L) 128(L) 145(L)   CMP Latest Ref Rng & Units 05/19/2021 05/18/2021 05/18/2021  Glucose 70 - 99 mg/dL 145(H) 121(H) 121(H)  BUN 8 - 23 mg/dL 15 17 13   Creatinine 0.61 - 1.24 mg/dL 0.95 1.01 0.98  Sodium 135 - 145 mmol/L 136 134(L) 137  Potassium 3.5 - 5.1 mmol/L 4.0 4.0 4.6  Chloride  98 - 111 mmol/L 106 103 108  CO2 22 - 32 mmol/L 26 25 25   Calcium 8.9 - 10.3 mg/dL 7.7(L) 7.5(L) 7.5(L)  Total Protein 6.5 - 8.1 g/dL - - -  Total Bilirubin 0.3 - 1.2 mg/dL - - -  Alkaline Phos 38 - 126 U/L - - -  AST 15 - 41 U/L - - -  ALT 0 - 44 U/L - - -    CXR: stable  _______________________________________________________________  Assessment and Plan: POD 2 s/p CABG  Neuro: pain controlled CV: on a/s/bb.  Will remove wires Pulm: pulm hyg, will remove chest tubes Renal: creat stable.  Gentle diuresis today GI: on diet Heme: stable ID: afebrile Endo: SSI Dispo: floor today   Lajuana Matte 05/19/2021 7:42 AM

## 2021-05-19 NOTE — Progress Notes (Signed)
CARDIAC REHAB PHASE I   Went to offer to walk with pt. Pt just returned to bed from recliner as his back was hurting. Pt states he has ambulated twice today in the ICU prior to transfer. Pt and family educated on importance of IS use, ambulation, and sternal precautions. Will f/u later to encourage ambulation.  Addendum: Pts HR noted to jump up to 130s on monitor, BP stable, RN aware. Will allow pt to rest at this time.    [id number] Rufina Falco, RN BSN 05/19/2021 1:34 PM

## 2021-05-19 NOTE — Progress Notes (Signed)
Paged [facility] at [phone] after patient went into afib with RVR with rate into 140's. Pt asymptomatic and not sustaining. Called again at 1436 due to sustaining HR. Pt given 150 mg amiodarone bolus and 400mg  PO amiodarone. Continued to monitor.  Called PA again at 1745 with afib HR elevating sustaining in 110's-150's. Pt given 5 mg metoprolol and new order for another 150mg  amiodarone bolus given. HR dropped to 90's briefly after first and second bolus. Pt converted to sinus rhythm in 60's at 1950. Payton Emerald, RN

## 2021-05-20 LAB — GLUCOSE, CAPILLARY
Glucose-Capillary: 122 mg/dL — ABNORMAL HIGH (ref 70–99)
Glucose-Capillary: 130 mg/dL — ABNORMAL HIGH (ref 70–99)
Glucose-Capillary: 144 mg/dL — ABNORMAL HIGH (ref 70–99)
Glucose-Capillary: 152 mg/dL — ABNORMAL HIGH (ref 70–99)
Glucose-Capillary: 156 mg/dL — ABNORMAL HIGH (ref 70–99)

## 2021-05-20 LAB — BASIC METABOLIC PANEL
Anion gap: 4 — ABNORMAL LOW (ref 5–15)
BUN: 16 mg/dL (ref 8–23)
CO2: 28 mmol/L (ref 22–32)
Calcium: 8.3 mg/dL — ABNORMAL LOW (ref 8.9–10.3)
Chloride: 107 mmol/L (ref 98–111)
Creatinine, Ser: 1.05 mg/dL (ref 0.61–1.24)
GFR, Estimated: >60 mL/min (ref >60)
Glucose, Bld: 144 mg/dL — ABNORMAL HIGH (ref 70–99)
Potassium: 4.6 mmol/L (ref 3.5–5.1)
Sodium: 139 mmol/L (ref 135–145)

## 2021-05-20 LAB — MAGNESIUM: Magnesium: 2.2 mg/dL (ref 1.7–2.4)

## 2021-05-20 LAB — CBC
HCT: 32 % — ABNORMAL LOW (ref 39.0–52.0)
Hemoglobin: 10.7 g/dL — ABNORMAL LOW (ref 13.0–17.0)
MCH: 33.6 pg (ref 26.0–34.0)
MCHC: 33.4 g/dL (ref 30.0–36.0)
MCV: 100.6 fL — ABNORMAL HIGH (ref 80.0–100.0)
Platelets: 125 10*3/uL — ABNORMAL LOW (ref 150–400)
RBC: 3.18 MIL/uL — ABNORMAL LOW (ref 4.22–5.81)
RDW: 13.4 % (ref 11.5–15.5)
WBC: 8.6 10*3/uL (ref 4.0–10.5)
nRBC: 0 % (ref 0.0–0.2)

## 2021-05-20 LAB — TSH: TSH: 3.914 u[IU]/mL (ref 0.350–4.500)

## 2021-05-20 MED ORDER — SORBITOL 70 % SOLN
30.0000 mL | Freq: Once | Status: AC
  Administered 2021-05-20: 30 mL via ORAL
  Filled 2021-05-20: qty 30

## 2021-05-20 MED ORDER — LACTULOSE 10 GM/15ML PO SOLN: 20.0000 g | Freq: Every day | ORAL | Status: DC | PRN

## 2021-05-20 NOTE — Progress Notes (Addendum)
[address]       [employer] [address]             [phone]      3 Days Post-Op Procedure(s) (LRB): CORONARY ARTERY BYPASS GRAFTING TIMES THREE USING LEFT INTERNAL  MAMMARY ARTERY AND RIGHT SAPHENOUS VEIN HARVESTED ENDOSCOPICALLY (N/A) TRANSESOPHAGEAL ECHOCARDIOGRAM (TEE) (N/A) APPLICATION OF CELL SAVER (N/A) Subjective: Back in sinus rhythm  Objective: Vital signs in last 24 hours: Temp:  [98.2 F (36.8 C)-98.5 F (36.9 C)] 98.2 F (36.8 C) (02/25 0300) Pulse Rate:  [63-142] 64 (02/25 0300) Cardiac Rhythm: Normal sinus rhythm (02/24 2000) Resp:  [9-22] 10 (02/25 0300) BP: (94-136)/(54-97) 108/58 (02/25 0300) SpO2:  [91 %-100 %] 97 % (02/25 0300) Weight:  [101.3 kg] 101.3 kg (02/25 0300)  Hemodynamic parameters for last 24 hours:    Intake/Output from previous day: 02/24 0701 - 02/25 0700 In: 240 [P.O.:240] Out: 1720 [Urine:1700; Chest Tube:20] Intake/Output this shift: No intake/output data recorded.  General appearance: alert, cooperative, and no distress Heart: regular rate and rhythm Lungs: mildly dim in bases Abdomen: mild distension, + BS, non tender Extremities: minor edema Wound: incis healing well  Lab Results: Recent Labs    05/19/21 0426 05/20/21 0325  WBC 9.4 8.6  HGB 10.5* 10.7*  HCT 32.0* 32.0*  PLT 118* 125*   BMET:  Recent Labs    05/19/21 0426 05/20/21 0325  NA 136 139  K 4.0 4.6  CL 106 107  CO2 26 28  GLUCOSE 145* 144*  BUN 15 16  CREATININE 0.95 1.05  CALCIUM 7.7* 8.3*    PT/INR:  Recent Labs    05/17/21 1328  LABPROT 17.0*  INR 1.4*   ABG    Component Value Date/Time   PHART 7.216 (L) 05/17/2021 1837   HCO3 22.1 05/17/2021 1837   TCO2 24 05/17/2021 1837   ACIDBASEDEF 6.0 (H) 05/17/2021 1837   O2SAT 89 05/17/2021 1837   CBG (last 3)  Recent Labs    05/19/21 2036 05/20/21 0005 05/20/21 0358  GLUCAP 174* 152* 130*    Meds Scheduled Meds:  acetaminophen  1,000 mg Oral Q6H   Or    acetaminophen (TYLENOL) oral liquid 160 mg/5 mL  1,000 mg Per Tube Q6H   amiodarone  400 mg Oral Daily   aspirin EC  325 mg Oral Daily   Or   aspirin  324 mg Per Tube Daily   bisacodyl  10 mg Oral Daily   Or   bisacodyl  10 mg Rectal Daily   Chlorhexidine Gluconate Cloth  6 each Topical Daily   dapagliflozin propanediol  5 mg Oral Daily   docusate sodium  200 mg Oral Daily   enoxaparin (LOVENOX) injection  40 mg Subcutaneous QHS   furosemide  40 mg Oral Daily   insulin aspart  0-24 Units Subcutaneous Q4H   mouth rinse  15 mL Mouth Rinse BID   metoprolol tartrate  12.5 mg Oral BID   Or   metoprolol tartrate  12.5 mg Per Tube BID   pantoprazole  40 mg Oral Daily   potassium chloride  40 mEq Oral Daily   pregabalin  100 mg Oral BID   rosuvastatin  20 mg Oral Once per day on Mon Wed Fri   sodium chloride flush  10-40 mL Intracatheter Q12H   sodium chloride flush  3 mL Intravenous Q12H   sodium chloride flush  3 mL Intravenous Q12H   Continuous Infusions:  sodium  chloride     PRN Meds:.sodium chloride, dextrose, metoprolol tartrate, morphine injection, ondansetron (ZOFRAN) IV, oxyCODONE, sodium chloride flush, sodium chloride flush, sodium chloride flush, traMADol  Xrays DG Chest Port 1 View  Result Date: 05/19/2021 CLINICAL DATA:  Chest tube present status post CABG. Sore chest today. EXAM: PORTABLE CHEST 1 VIEW COMPARISON:  05/18/2021 FINDINGS: Right internal jugular central venous catheter tip overlies the central superior vena cava. Status post median sternotomy and CABG. Cardiac silhouette is again moderately to markedly enlarged. There is again a poorly visualized possible chest tube overlying the inferior medial right chest but poorly visualized. Mediastinal contours are within normal limits. Mildly decreased lung volumes with bilateral basilar horizontal linear subsegmental atelectasis versus scarring. No pneumothorax is seen. No acute skeletal abnormality. IMPRESSION::  IMPRESSION: 1. No significant change from prior. 2. Bibasilar linear likely central atelectasis versus scarring. 3. Catheter tubing overlying the inferior medial right hemithorax is again seen not well evaluated. Electronically Signed   By: Yvonne Kendall M.D.   On: 05/19/2021 08:09    Assessment/Plan: S/P Procedure(s) (LRB): CORONARY ARTERY BYPASS GRAFTING TIMES THREE USING LEFT INTERNAL  MAMMARY ARTERY AND RIGHT SAPHENOUS VEIN HARVESTED ENDOSCOPICALLY (N/A) TRANSESOPHAGEAL ECHOCARDIOGRAM (TEE) (N/A) APPLICATION OF CELL SAVER (N/A)  POD#3  1 went into afib yesterday , amiodarone added, TSH is normal 2 afeb, VSS s BP 90's-130's 3 sats good on 2.5 liters 4 voiding, normal renal fxn- cont diuretic for now 5 expected ABLA is stable 6 thrombocytopenia is fairly stable, trending up a little c/w yesterday- on asa/lovenox- cont to monitor 7 add lactulose prn 9 BS controlled- back on farxiga, all home meds at d/c 10 routine pulm hygiene and cardiac rehab 11 home 1-2 days if no new issues    LOS: 3 days    Justin Carr 05/20/2021 Patient examined and most recent chest x-ray image personally reviewed. Patient now maintaining sinus rhythm Ambulatory on room air Expect him to be ready for discharge tomorrow   patient examined and medical record reviewed,agree with above note. Justin Carr 05/20/2021

## 2021-05-20 NOTE — Progress Notes (Addendum)
CARDIAC REHAB PHASE I   PRE:  Rate/Rhythm: 80 SR  BP:  Sitting: 125/60      SaO2: 98 RA  MODE:  Ambulation: 100 ft   POST:  Rate/Rhythm: 84 SR  BP:  Sitting: 142/64      SaO2: 98 RA  Came to walk pt. Pt was with nurse to get meds. Gave sternal precaution cues and assist to stand out of bed. Pt tolerated exercise well and amb 100 ft with contact guard assist. Pt denies CP or dizziness throughout walk. Pt felt slightly SOB once he reached the desk and we returned to his room. Told pt that walking would increase steadily. Ed given to pt. Discussed heart healthy and diabetic diet, sternal precautions, IS use, wound care, and exercise guidelines. Will refer to [place]. Pt left in the bed w/ call bell in reach. Nurse will give pt rest of meds after walk. Discussed family care upon discharge w/ pt. Pt stated that son will be home to help him. Will continue to follow.  [id number] Sheppard Plumber, MS, ACSM-CEP 05/20/2021 9:43 AM

## 2021-05-20 NOTE — Progress Notes (Signed)
Patient refused CPAP for the night  

## 2021-05-20 NOTE — Plan of Care (Signed)
  Problem: Clinical Measurements: Goal: Will remain free from infection Outcome: Progressing Goal: Diagnostic test results will improve Outcome: Progressing Goal: Cardiovascular complication will be avoided Outcome: Progressing   Problem: Activity: Goal: Risk for activity intolerance will decrease Outcome: Progressing   

## 2021-05-21 LAB — CBC
HCT: 32 % — ABNORMAL LOW (ref 39.0–52.0)
Hemoglobin: 11.2 g/dL — ABNORMAL LOW (ref 13.0–17.0)
MCH: 33.9 pg (ref 26.0–34.0)
MCHC: 35 g/dL (ref 30.0–36.0)
MCV: 97 fL (ref 80.0–100.0)
Platelets: 166 10*3/uL (ref 150–400)
RBC: 3.3 MIL/uL — ABNORMAL LOW (ref 4.22–5.81)
RDW: 13.2 % (ref 11.5–15.5)
WBC: 7.3 10*3/uL (ref 4.0–10.5)
nRBC: 0 % (ref 0.0–0.2)

## 2021-05-21 LAB — GLUCOSE, CAPILLARY
Glucose-Capillary: 116 mg/dL — ABNORMAL HIGH (ref 70–99)
Glucose-Capillary: 120 mg/dL — ABNORMAL HIGH (ref 70–99)
Glucose-Capillary: 121 mg/dL — ABNORMAL HIGH (ref 70–99)
Glucose-Capillary: 138 mg/dL — ABNORMAL HIGH (ref 70–99)
Glucose-Capillary: 159 mg/dL — ABNORMAL HIGH (ref 70–99)
Glucose-Capillary: 204 mg/dL — ABNORMAL HIGH (ref 70–99)

## 2021-05-21 LAB — MAGNESIUM: Magnesium: 2 mg/dL (ref 1.7–2.4)

## 2021-05-21 MED ORDER — AMIODARONE IV BOLUS ONLY 150 MG/100ML
150.0000 mg | Freq: Once | INTRAVENOUS | Status: AC
  Administered 2021-05-21: 150 mg via INTRAVENOUS
  Filled 2021-05-21: qty 100

## 2021-05-21 MED ORDER — METOPROLOL TARTRATE 25 MG PO TABS
25.0000 mg | ORAL_TABLET | Freq: Two times a day (BID) | ORAL | Status: DC
  Administered 2021-05-21 – 2021-05-22 (×3): 25 mg via ORAL
  Filled 2021-05-21 (×3): qty 1

## 2021-05-21 MED ORDER — METOPROLOL TARTRATE 25 MG/10 ML ORAL SUSPENSION: 25.0000 mg | Freq: Two times a day (BID) | ORAL | Status: DC

## 2021-05-21 MED ORDER — INSULIN ASPART 100 UNIT/ML IJ SOLN
0.0000 [IU] | Freq: Three times a day (TID) | INTRAMUSCULAR | Status: DC
  Administered 2021-05-21: 3 [IU] via SUBCUTANEOUS
  Administered 2021-05-21 – 2021-05-22 (×2): 5 [IU] via SUBCUTANEOUS

## 2021-05-21 NOTE — Progress Notes (Addendum)
[address]       [employer] [address]             [phone]      4 Days Post-Op Procedure(s) (LRB): CORONARY ARTERY BYPASS GRAFTING TIMES THREE USING LEFT INTERNAL  MAMMARY ARTERY AND RIGHT SAPHENOUS VEIN HARVESTED ENDOSCOPICALLY (N/A) TRANSESOPHAGEAL ECHOCARDIOGRAM (TEE) (N/A) APPLICATION OF CELL SAVER (N/A) Subjective: Back in afib with some RVR  Objective: Vital signs in last 24 hours: Temp:  [97.9 F (36.6 C)-98.4 F (36.9 C)] 98.1 F (36.7 C) (02/26 0700) Pulse Rate:  [68-139] 125 (02/26 0700) Cardiac Rhythm: Atrial fibrillation (02/26 0746) Resp:  [12-21] 21 (02/26 0740) BP: (97-147)/(60-125) 110/61 (02/26 0700) SpO2:  [94 %-98 %] 95 % (02/26 0700) Weight:  [96.8 kg] 96.8 kg (02/26 0740)  Hemodynamic parameters for last 24 hours:    Intake/Output from previous day: 02/25 0701 - 02/26 0700 In: 667 [P.O.:480; I.V.:187] Out: 875 [Urine:875] Intake/Output this shift: No intake/output data recorded.  General appearance: alert, cooperative, fatigued, and no distress Heart: irregularly irregular rhythm Lungs: clear to auscultation bilaterally Abdomen: benign Extremities: no edema Wound: incis healing well  Lab Results: Recent Labs    05/20/21 0325 05/21/21 0215  WBC 8.6 7.3  HGB 10.7* 11.2*  HCT 32.0* 32.0*  PLT 125* 166   BMET:  Recent Labs    05/19/21 0426 05/20/21 0325  NA 136 139  K 4.0 4.6  CL 106 107  CO2 26 28  GLUCOSE 145* 144*  BUN 15 16  CREATININE 0.95 1.05  CALCIUM 7.7* 8.3*    PT/INR: No results for input(s): LABPROT, INR in the last 72 hours. ABG    Component Value Date/Time   PHART 7.216 (L) 05/17/2021 1837   HCO3 22.1 05/17/2021 1837   TCO2 24 05/17/2021 1837   ACIDBASEDEF 6.0 (H) 05/17/2021 1837   O2SAT 89 05/17/2021 1837   CBG (last 3)  Recent Labs    05/20/21 2107 05/21/21 0003 05/21/21 0407  GLUCAP 144* 116* 121*    Meds Scheduled Meds:  acetaminophen  1,000 mg Oral Q6H   Or    acetaminophen (TYLENOL) oral liquid 160 mg/5 mL  1,000 mg Per Tube Q6H   amiodarone  400 mg Oral Daily   aspirin EC  325 mg Oral Daily   Or   aspirin  324 mg Per Tube Daily   bisacodyl  10 mg Oral Daily   Or   bisacodyl  10 mg Rectal Daily   Chlorhexidine Gluconate Cloth  6 each Topical Daily   dapagliflozin propanediol  5 mg Oral Daily   docusate sodium  200 mg Oral Daily   enoxaparin (LOVENOX) injection  40 mg Subcutaneous QHS   furosemide  40 mg Oral Daily   insulin aspart  0-24 Units Subcutaneous Q4H   mouth rinse  15 mL Mouth Rinse BID   metoprolol tartrate  12.5 mg Oral BID   Or   metoprolol tartrate  12.5 mg Per Tube BID   pantoprazole  40 mg Oral Daily   potassium chloride  40 mEq Oral Daily   pregabalin  100 mg Oral BID   rosuvastatin  20 mg Oral Once per day on Mon Wed Fri   sodium chloride flush  10-40 mL Intracatheter Q12H   sodium chloride flush  3 mL Intravenous Q12H   sodium chloride flush  3 mL Intravenous Q12H   Continuous Infusions:  sodium chloride     PRN Meds:.sodium chloride, dextrose, lactulose,  metoprolol tartrate, morphine injection, ondansetron (ZOFRAN) IV, oxyCODONE, sodium chloride flush, sodium chloride flush, sodium chloride flush, traMADol  Xrays No results found.  Assessment/Plan: S/P Procedure(s) (LRB): CORONARY ARTERY BYPASS GRAFTING TIMES THREE USING LEFT INTERNAL  MAMMARY ARTERY AND RIGHT SAPHENOUS VEIN HARVESTED ENDOSCOPICALLY (N/A) TRANSESOPHAGEAL ECHOCARDIOGRAM (TEE) (N/A) APPLICATION OF CELL SAVER (N/A) POD#4  1 afebrile, s BP 97-140's, afib, will increase lopressor and give amio bolus. Will need to consider ACRX if not controlled/converted soon 2 sats good on RA 3 Voiding well, weight trending down, cont lasix for now but should be able to stop soon 4 BS adeq controlled, all home meds at d/c cont current RX for now 5 anemia is stable 6 thrombocytopenia has resolved 7 routine pulm hygiene/rehab modalities as able 8 magnesium ok,  repeat BMET in am   LOS: 4 days    Justin Carr 05/21/2021 The patient converted to sinus rhythm mid day. He is frustrated not to go home today but the indications for remaining hospitalized were reviewed with the patient. If he remains in sinus rhythm overnight he should be able to be discharged without NOAC

## 2021-05-21 NOTE — Progress Notes (Signed)
°   05/21/21 0602  Assess: MEWS Score  Temp 97.9 F (36.6 C)  BP 97/64  Pulse Rate 76  ECG Heart Rate (!) 136  Resp 18  Level of Consciousness Alert  SpO2 94 %  O2 Device Room Air  Patient Activity (if Appropriate) In bed  Assess: MEWS Score  MEWS Temp 0  MEWS Systolic 1  MEWS Pulse 3  MEWS RR 0  MEWS LOC 0  MEWS Score 4  MEWS Score Color Red  Assess: if the MEWS score is Yellow or Red  Were vital signs taken at a resting state? Yes  Focused Assessment No change from prior assessment  Early Detection of Sepsis Score *See Row Information* Low  MEWS guidelines implemented *See Row Information* Yes  Treat  MEWS Interventions Administered prn meds/treatments  Pain Scale 0-10  Pain Score 0  Take Vital Signs  Increase Vital Sign Frequency  Red: Q 1hr X 4 then Q 4hr X 4, if remains red, continue Q 4hrs  Escalate  MEWS: Escalate Red: discuss with charge nurse/RN and provider, consider discussing with RRT  Notify: Charge Nurse/RN  Name of Charge Nurse/RN Notified Tim,RN  Date Charge Nurse/RN Notified 05/21/21  Time Charge Nurse/RN Notified 0600  Notify: Provider  Provider Name/Title Carlisle,MD  Date Provider Notified 05/21/21  Time Provider Notified [phone]  Notification Type Page  Notification Reason Critical result  Provider response No new orders  Date of Provider Response 05/21/21  Time of Provider Response [phone]  Document  Patient Outcome Stabilized after interventions  Progress note created (see row info) Yes   Pt stated "I'm fine, never felt bad". RN will continue to monitor pt.

## 2021-05-21 NOTE — Progress Notes (Signed)
Patient has home mask for CPAP but refused at this time.

## 2021-05-22 ENCOUNTER — Other Ambulatory Visit (HOSPITAL_COMMUNITY): Payer: Self-pay

## 2021-05-22 LAB — GLUCOSE, CAPILLARY
Glucose-Capillary: 141 mg/dL — ABNORMAL HIGH (ref 70–99)
Glucose-Capillary: 225 mg/dL — ABNORMAL HIGH (ref 70–99)
Glucose-Capillary: 135 mg/dL — ABNORMAL HIGH (ref 70–99)

## 2021-05-22 MED ORDER — TRAMADOL HCL 50 MG PO TABS: 50.0000 mg | ORAL_TABLET | ORAL | 0 refills | Status: DC | PRN

## 2021-05-22 MED ORDER — APIXABAN 5 MG PO TABS: 5.0000 mg | ORAL_TABLET | Freq: Two times a day (BID) | ORAL | 1 refills | Status: DC

## 2021-05-22 MED ORDER — ACETAMINOPHEN 500 MG PO TABS: 500.0000 mg | ORAL_TABLET | Freq: Four times a day (QID) | ORAL | 0 refills | Status: DC | PRN

## 2021-05-22 MED ORDER — POTASSIUM CHLORIDE CRYS ER 20 MEQ PO TBCR
20.0000 meq | EXTENDED_RELEASE_TABLET | Freq: Every day | ORAL | 0 refills | Status: DC
Start: 2021-05-23 — End: 2021-06-13

## 2021-05-22 MED ORDER — AMIODARONE HCL 200 MG PO TABS: 400.0000 mg | ORAL_TABLET | Freq: Every day | ORAL | 1 refills | Status: DC

## 2021-05-22 MED ORDER — FUROSEMIDE 40 MG PO TABS: 40.0000 mg | ORAL_TABLET | Freq: Every day | ORAL | 0 refills | Status: DC

## 2021-05-22 MED ORDER — METOPROLOL TARTRATE 25 MG PO TABS: 25.0000 mg | ORAL_TABLET | Freq: Two times a day (BID) | ORAL | 3 refills | Status: DC

## 2021-05-22 NOTE — Plan of Care (Signed)

## 2021-05-22 NOTE — TOC Transition Note (Addendum)
Transition of Care (TOC) - CM/SW Discharge Note Marvetta Gibbons RN, BSN Transitions of Care Unit 4E- RN Case Manager See Treatment Team for direct phone #    Patient Details  Name: Justin Carr MRN: [id number] Date of Birth: 01/26/1946  Transition of Care [facility]) CM/SW Contact:  Dawayne Patricia, RN Phone Number: 05/22/2021, 10:46 AM   Clinical Narrative:    Pt stable for transition home today, no needs noted. Pt to return home w/ wife s/p CABG. Notified by Latricia Heft that pt has pre-op protocol referral with them for [facility] needs- they will follow up with pt post discharge for [facility] needs.    Final next level of care: Home/Self Care Barriers to Discharge: No Barriers Identified   Patient Goals and CMS Choice Patient states their goals for this hospitalization and ongoing recovery are:: return home   Choice offered to / list presented to : NA  Discharge Placement               Home        Discharge Plan and Services                DME Arranged: N/A DME Agency: NA       HH Arranged: NA HH Agency: NA        Social Determinants of Health (SDOH) Interventions     Readmission Risk Interventions Readmission Risk Prevention Plan 05/22/2021  Post Dischage Appt Complete  Medication Screening Complete  Transportation Screening Complete  Some recent data might be hidden

## 2021-05-22 NOTE — TOC Benefit Eligibility Note (Signed)
Patient Teacher, English as a foreign language completed.    The patient is currently admitted and upon discharge could be taking Eliquis 5 mg.  The current 30 day co-pay is, $50.00.   The patient is insured through [place], [place] Patient Advocate Specialist [place] Patient Advocate Team Direct Number: [phone]  Fax: [phone]

## 2021-05-22 NOTE — Progress Notes (Signed)
D/c tele and Ivs. Removed suture per order. Went over AVS with pt and all questions were answered.   Lavenia Atlas, RN

## 2021-05-22 NOTE — Progress Notes (Signed)
CARDIAC REHAB PHASE I   D/c education reinforced with pt. Pt educated on importance of site care and monitoring incisions daily. Encouraged continued IS, ambulation, and sternal precautions. Reviewed restrictions and exercise guidelines. Pt referred to [facility] [place], pt very excited to attend.  [id number] Rufina Falco, RN BSN 05/22/2021 9:23 AM

## 2021-05-22 NOTE — Progress Notes (Signed)
° °   °  [address]       [place] [address]             [phone]      5 Days Post-Op Procedure(s) (LRB): CORONARY ARTERY BYPASS GRAFTING TIMES THREE USING LEFT INTERNAL  MAMMARY ARTERY AND RIGHT SAPHENOUS VEIN HARVESTED ENDOSCOPICALLY (N/A) TRANSESOPHAGEAL ECHOCARDIOGRAM (TEE) (N/A) APPLICATION OF CELL SAVER (N/A)  Subjective:  Patient sitting up on side of bed.  No complaints.  Overall feels great and wants to go home.  Objective: Vital signs in last 24 hours: Temp:  [98 F (36.7 C)-98.8 F (37.1 C)] 98.7 F (37.1 C) (02/27 0736) Pulse Rate:  [62-109] 74 (02/27 0736) Cardiac Rhythm: Normal sinus rhythm (02/27 0819) Resp:  [14-20] 17 (02/27 0736) BP: (88-146)/(60-93) 146/71 (02/27 0736) SpO2:  [93 %-98 %] 94 % (02/27 0736) Weight:  [95.9 kg] 95.9 kg (02/27 0500)  Intake/Output from previous day: 02/26 0701 - 02/27 0700 In: 240 [P.O.:240] Out: -   General appearance: alert, cooperative, and no distress Heart: regular rate and rhythm Lungs: clear to auscultation bilaterally Abdomen: soft, non-tender; bowel sounds normal; no masses,  no organomegaly Extremities: edema trace Wound: clean and dry  Lab Results: Recent Labs    05/20/21 0325 05/21/21 0215  WBC 8.6 7.3  HGB 10.7* 11.2*  HCT 32.0* 32.0*  PLT 125* 166   BMET:  Recent Labs    05/20/21 0325  NA 139  K 4.6  CL 107  CO2 28  GLUCOSE 144*  BUN 16  CREATININE 1.05  CALCIUM 8.3*    PT/INR: No results for input(s): LABPROT, INR in the last 72 hours. ABG    Component Value Date/Time   PHART 7.216 (L) 05/17/2021 1837   HCO3 22.1 05/17/2021 1837   TCO2 24 05/17/2021 1837   ACIDBASEDEF 6.0 (H) 05/17/2021 1837   O2SAT 89 05/17/2021 1837   CBG (last 3)  Recent Labs    05/21/21 2130 05/21/21 2355 05/22/21 0505  GLUCAP 138* 120* 141*    Assessment/Plan: S/P Procedure(s) (LRB): CORONARY ARTERY BYPASS GRAFTING TIMES THREE USING LEFT INTERNAL  MAMMARY ARTERY AND RIGHT  SAPHENOUS VEIN HARVESTED ENDOSCOPICALLY (N/A) TRANSESOPHAGEAL ECHOCARDIOGRAM (TEE) (N/A) APPLICATION OF CELL SAVER (N/A)  CV- PAF, maintaining NSR,  + HTN- will resume home Altace, continue Lopressor, Amiodarone Pulm- no acute issues, off oxygen, continue IS Renal- creatinine has been stable, weight is trending down, continue Lasix Dm- sugars elevated at times, will resume home regimen Dispo- patient stable, remains in NSR this morning. CHADsVasc score is 3 which places patient in high risk category for stroke, will start Eliquis and discharge home today.   LOS: 5 days    Ellwood Handler, PA-C 05/22/2021

## 2021-05-22 NOTE — Care Management Important Message (Signed)
Important Message  Patient Details  Name: Justin Carr MRN: [id number] Date of Birth: Oct 02, 1945   Medicare Important Message Given:  Yes     Shelda Altes 05/22/2021, 9:37 AM

## 2021-05-24 ENCOUNTER — Ambulatory Visit: Payer: Medicare Other | Admitting: Cardiovascular Disease

## 2021-05-26 ENCOUNTER — Telehealth (HOSPITAL_COMMUNITY): Payer: Self-pay

## 2021-05-26 ENCOUNTER — Ambulatory Visit (INDEPENDENT_AMBULATORY_CARE_PROVIDER_SITE_OTHER): Payer: Self-pay | Admitting: Thoracic Surgery (Cardiothoracic Vascular Surgery)

## 2021-05-26 ENCOUNTER — Other Ambulatory Visit: Payer: Self-pay

## 2021-05-26 DIAGNOSIS — Z951 Presence of aortocoronary bypass graft: Secondary | ICD-10-CM

## 2021-05-26 NOTE — Telephone Encounter (Signed)
Pt insurance is active and benefits verified through [employer] Medicare Co-pay 0, DED 0/0 met, out of pocket $3,600/$388.80 met, co-insurance 0%. no pre-authorization required. Passport, 05/26/2021@9:44am, REF# [id number] ? ?2ndary insurance is active and benefits verified through [employer]. Co-pay $30, DED 0/0 met, out of pocket $6,500/$235.41 met, co-insurance 0%. No pre-authorization required. Passport, 05/26/2021@9:55am, REF# [id number] ? ?Will contact patient to see if he is interested in the Cardiac Rehab Program. If interested, patient will need to complete follow up appt. Once completed, patient will be contacted for scheduling upon review by the RN Navigator. ?

## 2021-05-26 NOTE — Telephone Encounter (Signed)
Called patient to see if he is interested in the Cardiac Rehab Program. Patient expressed interest. Explained scheduling process and went over insurance, patient verbalized understanding. Will contact patient for scheduling once f/u has been completed.  °

## 2021-05-26 NOTE — Progress Notes (Signed)
?   ?  [address] ?      [place] [address] ?            [phone]      ? ?Patient: Home ?Provider: Office ?Consent for Telemedicine visit obtained. ? ?Today?s visit was completed via a real-time telehealth (see specific modality noted below). The patient/authorized person provided oral consent at the time of the visit to engage in a telemedicine encounter with the present provider at [facility]. The patient/authorized person was informed of the potential benefits, limitations, and risks of telemedicine. The patient/authorized person expressed understanding that the laws that protect confidentiality also apply to telemedicine. The patient/authorized person acknowledged understanding that telemedicine does not provide emergency services and that he or she would need to call 911 or proceed to the nearest hospital for help if such a need arose. ? ? Total time spent in the clinical discussion 10 minutes. ? Telehealth Modality: Phone visit (audio only) ? ?I had a telephone visit with  [name] who is s/p CABG.  Overall doing well.   ?Pain is minimal.  Ambulating well. Vitals have been stable.  [name] will see [place] back in 1 month with a chest x-ray for cardiac rehab clearance. ? ?Lajuana Matte ? ?

## 2021-05-29 ENCOUNTER — Other Ambulatory Visit: Payer: Self-pay

## 2021-05-29 MED ORDER — FUROSEMIDE 40 MG PO TABS: 40.0000 mg | ORAL_TABLET | Freq: Every day | ORAL | 0 refills | Status: DC

## 2021-05-29 NOTE — Progress Notes (Signed)
[place] with [place] contacted the office today to state patient has had a three lb weight gain over 2 days and also has 1+ pitting edema. His current weight is 212lbs, was discharged at 210lbs and was 209lbs two days ago. He is short of breath with exertion but not with rest which is to be expected after surgery. He is s/p CABG x3 with Dr. Kipp Brood 05/17/21. Spoke with Ellwood Handler, PA who advised patient take 1 tablet Lasix daily x7 days, then take one tablet as needed for a weight increase of 3 lbs. Sent in prescription to patient's preferred pharmacy, [facility] on [address]. Patient made aware and acknowledged receipt. He is to follow-up with Cardiology within the next week. ?

## 2021-06-04 ENCOUNTER — Other Ambulatory Visit: Payer: Self-pay | Admitting: Cardiovascular Disease

## 2021-06-09 ENCOUNTER — Ambulatory Visit: Payer: Medicare Other | Admitting: Adult Health

## 2021-06-12 NOTE — Progress Notes (Signed)
? ? ?Office Visit  ?  ?Patient Name: Justin Carr ?Date of Encounter: 06/13/2021 ? ?Primary Care Provider:  Shirline Frees, MD ?Primary Cardiologist:  Quay Burow, MD ? ?Chief Complaint  ?  ?76 year old male with a history of CAD s/p CABG x3, postop atrial fibrillation, hypertension, hyperlipidemia, OSA on CPAP, type 2 diabetes and GERD who presents for hospital follow-up related to CAD s/p CABG x3. ? ?Past Medical History  ?  ?Past Medical History:  ?Diagnosis Date  ? Coronary artery disease   ? GERD (gastroesophageal reflux disease)   ? Hyperlipidemia   ? Hypertension   ? NSTEMI (non-ST elevated myocardial infarction) ([place]) 06/2007  ? [name] 07/27/2010  ? OSA on CPAP   ? Palpitations   ? Pneumonia ~ 1971 X 1  ? Type II diabetes mellitus ([place])   ? ?Past Surgical History:  ?Procedure Laterality Date  ? APPENDECTOMY    ? CARDIOVASCULAR STRESS TEST  10/06/2007  ? No scintigraphic evidence of inducible myocardial ischemia. ECG positive for ischemia. Pharmacologically induced ST segment depression. Perfusion defect seen in inferior myocardial region consistent with diaphragmatic attenuation.  ? CORONARY ANGIOPLASTY WITH STENT PLACEMENT  07/03/2007  ? LAD 90% ulcerated plaque proximal between 1st and 2nd branches stented with a 2.75x45m Taxus Liberte stent deployed at 16atm. Postdilated at 16atm (3.06m. resulting in reduction of 90% lesion to 0% residual with TIMI3 flow. Impingement on theostium of diag branch dilatedat 6atm resulting in reduction of 90% ostial D2 to <50% residual.  ? CORONARY ANGIOPLASTY WITH STENT PLACEMENT  04/11/2017  ? CORONARY ARTERY BYPASS GRAFT N/A 05/17/2021  ? Procedure: CORONARY ARTERY BYPASS GRAFTING TIMES THREE USING LEFT INTERNAL  MAMMARY ARTERY AND RIGHT SAPHENOUS VEIN HARVESTED ENDOSCOPICALLY;  Surgeon: LiLajuana MatteMD;  [facility] Service: Open Heart Surgery;  Laterality: N/A;  ? INGUINAL HERNIA REPAIR Right 01/06/2018  ? Procedure: OPEN RIGHT INGUINAL HERNIA REPAIR  WITH MESH;  Surgeon: GeArmandina GemmaMD;  [facility] Service: General;  Laterality: Right;  ? INSERTION OF MESH Right 01/06/2018  ? Procedure: INSERTION OF MESH;  Surgeon: GeArmandina GemmaMD;  [facility] Service: General;  Laterality: Right;  ? INTRAVASCULAR ULTRASOUND/IVUS N/A 05/11/2021  ? Procedure: Intravascular Ultrasound/IVUS;  Surgeon: BeLorretta HarpMD;  [facility];  Service: Cardiovascular;  Laterality: N/A;  ? LAPAROSCOPIC APPENDECTOMY N/A 04/01/2018  ? Procedure: APPENDECTOMY LAPAROSCOPIC;  Surgeon: GeArmandina GemmaMD;  Location: [facility];  Service: General;  Laterality: N/A;  ? LEFT HEART CATH AND CORONARY ANGIOGRAPHY N/A 04/11/2017  ? Procedure: LEFT HEART CATH AND CORONARY ANGIOGRAPHY;  Surgeon: BeLorretta HarpMD;  [facility];  Service: Cardiovascular;  Laterality: N/A;  ? LEFT HEART CATH AND CORONARY ANGIOGRAPHY N/A 05/11/2021  ? Procedure: LEFT HEART CATH AND CORONARY ANGIOGRAPHY;  Surgeon: BeLorretta HarpMD;  [facility];  Service: Cardiovascular;  Laterality: N/A;  ? LIGAMENT REPAIR Right 03/2001  ? wrist  ? RENAL ARTERIAL DOPPLER  05/20/2012  ? SMA and Cephalic artery >6>16%iameter reduction, Rt prox Renal artery 60-99% diameter reduction, Lft prox Renal artery 1-59% diameter reduction, kidneys are normal in size.  ? SHOULDER ARTHROSCOPY WITH SUBACROMIAL DECOMPRESSION Right 04/03/2013  ? Procedure: RIGHT SHOULDER ARTHROSCOPY WITH SUBACROMIAL DECOMPRESSION/DISTAL CLAVICLE RESECTION;  Surgeon: KeMarin ShutterMD;  [facility]: [facility]opedics;  Laterality: Right;  ? TEE WITHOUT CARDIOVERSION N/A 05/17/2021  ? Procedure: TRANSESOPHAGEAL ECHOCARDIOGRAM (TEE);  Surgeon: LiMelodie Bouillon  O, MD;  Location: [facility];  Service: Open Heart Surgery;  Laterality: N/A;  ? TONSILLECTOMY AND ADENOIDECTOMY  ~ 1952  ? TRANSTHORACIC ECHOCARDIOGRAM  10/06/2007  ? EF 63%, normal.  ? WISDOM TOOTH EXTRACTION    ? age 56   ? ? ?Allergies ? ?Allergies  ?Allergen Reactions  ? Peanuts [Peanut Oil] Anaphylaxis  ? Lipitor [Atorvastatin] Other (See Comments)  ?  Muscle cramps  ? Rosuvastatin Other (See Comments)  ?  Muscle cramps if taken daily-pt still takes drug, but takes 3 times a week.  ? ? ?History of Present Illness  ?  ?76 year old male with the above past medical history including CAD s/p CABG x3, postop atrial fibrillation, hypertension, hyperlipidemia, OSA on CPAP, type 2 diabetes and GERD. ? ?He has a history of CAD and NSTEMI in 2009.  He underwent PCI and stenting of his pLAD at the time.  He also had a 60% right renal artery stenosis documented at the time, which has been followed by duplex ultrasound. Lexiscan Myoview in January 2019 showed ischemia in the LAD territory. Cardiac catheterization in January 2018 showed 99% in-stent restenosis to the pLAD stent, treated with a DES.  He was last seen in the office on 05/10/2021 and reported left shoulder pain, similar to prior anginal equivalent.  Cardiac catheterization on 05/11/2021 showed patent p-mLAD stents, oLM-LM 70%, mLAD 30%, and pRCA 75 %, EF 50-55%.  Echocardiogram showed EF 60 to 65%, G1 DD, aortic valve sclerosis without evidence of aortic stenosis.  He was referred to CT surgery for consideration of CABG and ultimately underwent CABG x3 (LIMA-LAD, SVG-OM, PDA) on 05/17/2021.  He was hospitalized from 05/17/2021 to 05/22/2021.  He did have postop atrial fibrillation with RVR, and was started on amiodarone and Eliquis.  He was discharged home in stable condition on 05/22/2021.  He saw Dr. Kipp Brood virtually on 05/26/2021 and was stable overall from a cardiac standpoint.  His home health nurse noted bilateral lower extremity edema and weight gain of 2.7lbs, following discharge and he was subsequently was started on Lasix 40 mg daily for 1 week, with as needed use for weight gain, edema thereafter.  ? ?He presents today for follow-up accompanied by his wife.  Since his  hospital discharge is done well overall from a cardiac standpoint.  He does have some mild bilateral lower extremity edema.  He states he has not taken his Lasix in several days.  He has some mild incisional pain, however, this is gradually improving.  He denies any symptoms concerning for angina, denies dyspnea, palpitations, PND, orthopnea.  He does report generalized fatigue, he thinks that his fatigue has increased since starting metoprolol.  He is very grateful to the hospital staff and to Dr. [name] for the care he received eating up to and during his hospitalization. He is walking 15 to 20 minutes daily, and is eager to begin cardiac rehab.  Other than his generalized fatigue, he denies any additional concerns or complaints today. ? ?Home Medications  ?  ?Current Outpatient Medications  ?Medication Sig Dispense Refill  ? acetaminophen (TYLENOL) 500 MG tablet Take 1-2 tablets (500-1,000 mg total) by mouth every 6 (six) hours as needed for mild pain or fever. 30 tablet 0  ? Alpha-Lipoic Acid 300 MG CAPS Take 300 mg by mouth in the morning and at bedtime.    ? amiodarone (PACERONE) 200 MG tablet Take 2 tablets (400 mg total) by mouth daily. X 7 days, then decrease to 200 mg BID  x 7 days, then decrease to 200 mg daily 90 tablet 1  ? apixaban (ELIQUIS) 5 MG TABS tablet Take 1 tablet (5 mg total) by mouth 2 (two) times daily. 60 tablet 1  ? aspirin 81 MG tablet Take 1 tablet (81 mg total) by mouth daily. (Patient taking differently: Take 81 mg by mouth at bedtime.) 90 tablet 3  ? Cholecalciferol (VITAMIN D) 50 MCG (2000 UT) tablet Take 2,000 Units by mouth daily.    ? Coenzyme Q10 (COQ-10 PO) Take 1 capsule by mouth at bedtime.     ? dapagliflozin propanediol (FARXIGA) 5 MG TABS tablet Take 5 mg by mouth daily.    ? famotidine (PEPCID) 20 MG tablet Take 1 tablet (20 mg total) by mouth 2 (two) times daily. 60 tablet 6  ? furosemide (LASIX) 40 MG tablet Take 1 tablet (40 mg total) by mouth daily. Take 1 tablet by  mouth daily for 7 days. After 7 days, take 1 tablet as needed for weight gain of 3 lbs or more in one day. 30 tablet 0  ? metFORMIN (GLUCOPHAGE) 500 MG tablet Take 500 mg by mouth 2 (two) times daily.    ? Multiple Vitamin (MULTIVI

## 2021-06-13 ENCOUNTER — Telehealth: Payer: Self-pay

## 2021-06-13 ENCOUNTER — Other Ambulatory Visit: Payer: Self-pay

## 2021-06-13 ENCOUNTER — Encounter: Payer: Self-pay | Admitting: Nurse Practitioner

## 2021-06-13 ENCOUNTER — Ambulatory Visit (INDEPENDENT_AMBULATORY_CARE_PROVIDER_SITE_OTHER): Payer: Medicare Other | Admitting: Nurse Practitioner

## 2021-06-13 VITALS — BP 110/50 | HR 64 | Resp 20 | Ht 70.0 in | Wt 212.0 lb

## 2021-06-13 DIAGNOSIS — I480 Paroxysmal atrial fibrillation: Secondary | ICD-10-CM

## 2021-06-13 DIAGNOSIS — G4733 Obstructive sleep apnea (adult) (pediatric): Secondary | ICD-10-CM

## 2021-06-13 DIAGNOSIS — Z951 Presence of aortocoronary bypass graft: Secondary | ICD-10-CM

## 2021-06-13 DIAGNOSIS — I25119 Atherosclerotic heart disease of native coronary artery with unspecified angina pectoris: Secondary | ICD-10-CM | POA: Diagnosis not present

## 2021-06-13 DIAGNOSIS — E119 Type 2 diabetes mellitus without complications: Secondary | ICD-10-CM

## 2021-06-13 DIAGNOSIS — E785 Hyperlipidemia, unspecified: Secondary | ICD-10-CM

## 2021-06-13 DIAGNOSIS — Z79899 Other long term (current) drug therapy: Secondary | ICD-10-CM | POA: Diagnosis not present

## 2021-06-13 DIAGNOSIS — I10 Essential (primary) hypertension: Secondary | ICD-10-CM

## 2021-06-13 LAB — CBC
Hematocrit: 36.1 % — ABNORMAL LOW (ref 37.5–51.0)
Hemoglobin: 12.3 g/dL — ABNORMAL LOW (ref 13.0–17.7)
MCH: 32.1 pg (ref 26.6–33.0)
MCHC: 34.1 g/dL (ref 31.5–35.7)
MCV: 94 fL (ref 79–97)
Platelets: 253 10*3/uL (ref 150–450)
RBC: 3.83 x10E6/uL — ABNORMAL LOW (ref 4.14–5.80)
RDW: 12.8 % (ref 11.6–15.4)
WBC: 8.6 10*3/uL (ref 3.4–10.8)

## 2021-06-13 LAB — BASIC METABOLIC PANEL
BUN/Creatinine Ratio: 12 (ref 10–24)
BUN: 15 mg/dL (ref 8–27)
CO2: 28 mmol/L (ref 20–29)
Calcium: 9.7 mg/dL (ref 8.6–10.2)
Chloride: 99 mmol/L (ref 96–106)
Creatinine, Ser: 1.24 mg/dL (ref 0.76–1.27)
Glucose: 99 mg/dL (ref 70–99)
Potassium: 5.9 mmol/L (ref 3.5–5.2)
Sodium: 139 mmol/L (ref 134–144)
eGFR: 61 mL/min/{1.73_m2} (ref >59)

## 2021-06-13 MED ORDER — METOPROLOL TARTRATE 25 MG PO TABS: 25.0000 mg | ORAL_TABLET | Freq: Two times a day (BID) | ORAL | 3 refills | Status: DC

## 2021-06-13 MED ORDER — METOPROLOL TARTRATE 25 MG PO TABS: 12.5000 mg | ORAL_TABLET | Freq: Two times a day (BID) | ORAL | 3 refills | Status: DC

## 2021-06-13 NOTE — Patient Instructions (Addendum)
Medication Instructions:  ?Decrease Metoprolol 12.5 mg twice daily ?Continue Lasix as needed for 3 lbs weight gain daily or 5 lbs weight gain in a week.  ?*If you need a refill on your cardiac medications before your next appointment, please call your pharmacy* ? ? ?Lab Work: ?BMET & CBC today ? ?If you have labs (blood work) drawn today and your tests are completely normal, you will receive your results only by: ?MyChart Message (if you have MyChart) OR ?A paper copy in the mail ?If you have any lab test that is abnormal or we need to change your treatment, we will call you to review the results. ? ? ?Testing/Procedures: ?NONE ordered at this time of appointment  ? ? ?Follow-Up: ?At [facility], you and your health needs are our priority.  As part of our continuing mission to provide you with exceptional heart care, we have created designated Provider Care Teams.  These Care Teams include your primary Cardiologist (physician) and Advanced Practice Providers (APPs -  Physician Assistants and Nurse Practitioners) who all work together to provide you with the care you need, when you need it. ? ?We recommend signing up for the patient portal called "MyChart".  Sign up information is provided on this After Visit Summary.  MyChart is used to connect with patients for Virtual Visits (Telemedicine).  Patients are able to view lab/test results, encounter notes, upcoming appointments, etc.  Non-urgent messages can be sent to your provider as well.   ?To learn more about what you can do with MyChart, go to [email or web address].   ? ?Your next appointment:   ?1 month(s) ? ?The format for your next appointment:   ?In Person ? ?Provider:   ?Diona Browner, NP      ? ? ? ? ? ?

## 2021-06-13 NOTE — Telephone Encounter (Signed)
[facility] called to report critical lab: ?Potassium 5.9 on 06/13/21 at 8:56am for Justin Carr I will route to her.  ?

## 2021-06-14 ENCOUNTER — Telehealth: Payer: Self-pay

## 2021-06-14 ENCOUNTER — Other Ambulatory Visit: Payer: Self-pay

## 2021-06-14 DIAGNOSIS — E875 Hyperkalemia: Secondary | ICD-10-CM

## 2021-06-14 NOTE — Telephone Encounter (Signed)
Spoke with pt. Pt was notified of lab results and will come in today or tomorrow to complete labs. Pt will stop at the front desk to get the Lokelma 10 g sample packet. Pt understands that he will not take the [facility] until notified sample is needed.  ?

## 2021-06-15 ENCOUNTER — Other Ambulatory Visit: Payer: Self-pay

## 2021-06-15 DIAGNOSIS — E875 Hyperkalemia: Secondary | ICD-10-CM

## 2021-06-15 LAB — BASIC METABOLIC PANEL
BUN/Creatinine Ratio: 14 (ref 10–24)
BUN: 16 mg/dL (ref 8–27)
CO2: 25 mmol/L (ref 20–29)
Calcium: 9.2 mg/dL (ref 8.6–10.2)
Chloride: 100 mmol/L (ref 96–106)
Creatinine, Ser: 1.14 mg/dL (ref 0.76–1.27)
Glucose: 69 mg/dL — ABNORMAL LOW (ref 70–99)
Potassium: 5.7 mmol/L — ABNORMAL HIGH (ref 3.5–5.2)
Sodium: 141 mmol/L (ref 134–144)
eGFR: 67 mL/min/{1.73_m2} (ref >59)

## 2021-06-15 NOTE — Telephone Encounter (Signed)
Spoke with patient to inform him of blood work order to [employer] level (BMET) for Monday 3/27. Pt voiced understanding. We discussed limiting potassium in diet. He stated he doesn't eat a lot of food with potassium. ?

## 2021-06-20 ENCOUNTER — Telehealth: Payer: Self-pay

## 2021-06-20 LAB — BASIC METABOLIC PANEL
BUN/Creatinine Ratio: 11 (ref 10–24)
BUN: 12 mg/dL (ref 8–27)
CO2: 26 mmol/L (ref 20–29)
Calcium: 9.2 mg/dL (ref 8.6–10.2)
Chloride: 100 mmol/L (ref 96–106)
Creatinine, Ser: 1.09 mg/dL (ref 0.76–1.27)
Glucose: 104 mg/dL — ABNORMAL HIGH (ref 70–99)
Potassium: 5.2 mmol/L (ref 3.5–5.2)
Sodium: 139 mmol/L (ref 134–144)
eGFR: 71 mL/min/{1.73_m2} (ref >59)

## 2021-06-20 NOTE — Telephone Encounter (Signed)
Spoke with pt. Pt was notified of lab results and will continue current medications as directed.  ?

## 2021-06-27 ENCOUNTER — Other Ambulatory Visit: Payer: Self-pay | Admitting: Thoracic Surgery (Cardiothoracic Vascular Surgery)

## 2021-06-27 DIAGNOSIS — Z951 Presence of aortocoronary bypass graft: Secondary | ICD-10-CM

## 2021-06-28 NOTE — Progress Notes (Signed)
? ?   ?[address] ?      [place] [address] ?            [phone]   ? ?   ?HPI: ?This is a 76 year old male with a past medical history of obstructive sleep apnea ?(on CPAP), hypertension, diabetes mellitus, remote tobacco abuse,  ?hyperlipidemia, renal artery stenosis, and known coronary artery disease ?(NSTEMI 2009). ?Patient returns for routine postoperative follow-up having undergone a CABG x 3 ?On 05/17/2021 by Dr. Kipp Brood. ?The patient's early postoperative recovery while in the hospital was notable for atrial  ?fibrillation. He converted to sinus rhythm with Amiodarone protocol but because ?of his high CHADsVASC score, he was also put on Apixaban. ?Since hospital discharge the patient reports he is less fatigued since Metoprolol Tartrate was decreased to 12.5 mg bid.  He denies chest pain, arm pain, or shortness of breath. ? ? ?Current Outpatient Medications  ?Medication Sig Dispense Refill  ? acetaminophen (TYLENOL) 500 MG tablet Take 1-2 tablets (500-1,000 mg total) by mouth every 6 (six) hours as needed for mild pain or fever. 30 tablet 0  ? Alpha-Lipoic Acid 300 MG CAPS Take 300 mg by mouth in the morning and at bedtime.    ? amiodarone (PACERONE) 200 MG tablet Take 2 tablets (400 mg total) by mouth daily. X 7 days, then decrease to 200 mg BID x 7 days, then decrease to 200 mg daily 90 tablet 1  ? apixaban (ELIQUIS) 5 MG TABS tablet Take 1 tablet (5 mg total) by mouth 2 (two) times daily. 60 tablet 1  ? aspirin 81 MG tablet Take 1 tablet (81 mg total) by mouth daily. (Patient taking differently: Take 81 mg by mouth at bedtime.) 90 tablet 3  ? Cholecalciferol (VITAMIN D) 50 MCG (2000 UT) tablet Take 2,000 Units by mouth daily.    ? Coenzyme Q10 (COQ-10 PO) Take 1 capsule by mouth at bedtime.     ? dapagliflozin propanediol (FARXIGA) 5 MG TABS tablet Take 5 mg by mouth daily.    ? famotidine (PEPCID) 20 MG tablet Take 1 tablet (20 mg total) by mouth 2 (two) times daily. 60 tablet 6   ? furosemide (LASIX) 40 MG tablet Take 1 tablet (40 mg total) by mouth daily. Take 1 tablet by mouth daily for 7 days. After 7 days, take 1 tablet as needed for weight gain of 3 lbs or more in one day. 30 tablet 0  ? metFORMIN (GLUCOPHAGE) 500 MG tablet Take 500 mg by mouth 2 (two) times daily.    ? metoprolol tartrate (LOPRESSOR) 25 MG tablet Take 0.5 tablets (12.5 mg total) by mouth 2 (two) times daily. New Rx dosage. Pt is stopping 25 mg bid. 60 tablet 3  ? Multiple Vitamin (MULTIVITAMIN WITH MINERALS) TABS tablet Take 1 tablet by mouth daily.    ? Multiple Vitamins-Minerals (PRESERVISION AREDS PO) Take 1 capsule by mouth 2 (two) times daily.    ? nitroGLYCERIN (NITROSTAT) 0.4 MG SL tablet DISSOLVE ONE TABLET UNDER THE TONGUE EVERY 5 MINUTES AS NEEDED FOR CHEST PAIN.  DO NOT EXCEED A TOTAL OF 3 DOSES IN 15 MINUTES 25 tablet 1  ? pregabalin (LYRICA) 100 MG capsule Take 100 mg by mouth 2 (two) times daily.    ? ramipril (ALTACE) 10 MG capsule Take 1 capsule by mouth once daily (Patient taking differently: Take 10 mg by mouth every evening.) 90 capsule 3  ? rosuvastatin (CRESTOR) 20 MG tablet TAKE 1  TABLET BY MOUTH THREE TIMES A WEEK 36 tablet 0  ? sildenafil (VIAGRA) 100 MG tablet Take 100 mg by mouth daily as needed for erectile dysfunction.    ? TRULICITY 2.35 TD/3.2KG SOPN Inject 0.75 mg into the skin every Monday.    ?Vital Signs: ?Vitals:  ? 06/29/21 1354  ?BP: 114/68  ?Pulse: 65  ?Resp: 20  ?SpO2: 97%  ?  ?Physical Exam: ?CV-RRR ?Pulmonary-Clear to auscultation bilaterally ?Abdomen-Soft, non tender, bowel sounds present ?Extremities-No LE edema ?Wounds-Sternal and right LE wounds are clean, dry, well healed without signs of infection. ? ?Diagnostic Tests: ?  ?CLINICAL DATA:  Status post coronary bypass surgery ?  ?EXAM: ?CHEST - 2 VIEW ?  ?COMPARISON:  Previous studies including the examination of ?05/19/2021 ?  ?FINDINGS: ?Transverse diameter of heart is in the upper limits of normal. There ?is evidence of  previous coronary bypass surgery. There is interval ?removal of right IJ central venous catheter. There is improvement in ?aeration of lower lung fields. There is blunting of left lateral CP ?angle. There is no pneumothorax. ?  ?IMPRESSION: ?There is interval clearing of patchy infiltrates in both lower lung ?fields. There are no signs of pulmonary edema or new focal ?infiltrates. Blunting of left lateral CP angle suggests small left ?pleural effusion. ?  ?  ?Electronically Signed ?  By: Elmer Picker M.D. ?  On: 06/29/2021 13:40 ?   ? ? ?Impression and Plan: ?Patient continues to recover from coronary artery bypass grafting surgery. He has seen Diona Browner NP on 06/13/2021.  EKG showed SR, first degree heart block. With complaints of worsening fatigue, Lopressor was decreased to 12.5 mg bid. Cardiology will consider decreasing Amiodarone to 100 mg daily in about one month. Patient inquired if he has to be on Amiodarone, Lopressor, and Apixaban "forever". I anticipate him to continue on Lopressor indefinitely. I  told him to discuss with cardiology.  ?Patient was instructed to continue with sternal precautions (I.e. no lifting more than 10 pounds) for the next 2 weeks (07/15/2021). He was instructed no golfing until May. He is NOT taking any narcotic for pain so he may begin driving short distances (I.e. 30 minutes  ?or less during the day) and may gradually increase the frequency and duration as tolerates. We encourage the  ?patient to participate in cardiac rehab;he does wish to do so. We will make a referral to [facility] cardiac rehab. He was also instructed he may return to sexual activities. He will return to see Dr. Kipp Brood PRN. He will see Diona Browner NP on 04/20. ? ? ? ? ?Nani Skillern, PA-C ?[facility] ?[phone] ? ?

## 2021-06-29 ENCOUNTER — Ambulatory Visit
Admission: RE | Admit: 2021-06-29 | Discharge: 2021-06-29 | Disposition: A | Discharge status: Home / Self Care | Payer: Medicare Other | Source: Ambulatory Visit | Attending: Thoracic Surgery (Cardiothoracic Vascular Surgery) | Admitting: Thoracic Surgery (Cardiothoracic Vascular Surgery)

## 2021-06-29 ENCOUNTER — Encounter: Payer: Self-pay | Admitting: Physician Assistant

## 2021-06-29 ENCOUNTER — Ambulatory Visit (INDEPENDENT_AMBULATORY_CARE_PROVIDER_SITE_OTHER): Payer: Self-pay | Admitting: Physician Assistant

## 2021-06-29 VITALS — BP 114/68 | HR 65 | Resp 20 | Wt 208.0 lb

## 2021-06-29 DIAGNOSIS — R079 Chest pain, unspecified: Secondary | ICD-10-CM | POA: Diagnosis not present

## 2021-06-29 DIAGNOSIS — Z951 Presence of aortocoronary bypass graft: Secondary | ICD-10-CM

## 2021-06-29 DIAGNOSIS — I2510 Atherosclerotic heart disease of native coronary artery without angina pectoris: Secondary | ICD-10-CM

## 2021-06-29 NOTE — Patient Instructions (Signed)
You may return to driving an automobile as long as you are no longer requiring oral narcotic pain relievers during the daytime.  It would be wise to start driving only short distances during the daylight and gradually increase from there as you feel comfortable.  ? ?You are encouraged to enroll and participate in the outpatient cardiac rehab program beginning as soon as practical.  ? ?You may continue to gradually increase your physical activity as tolerated.  Refrain from any heavy lifting or strenuous use of your arms and shoulders until at least 8 weeks from the time of your surgery, and avoid activities that cause increased pain in your chest on the side of your surgical incision.  Otherwise, you may continue to increase activities without any particular limitations.  Increase the intensity and duration of physical activity gradually.  ?

## 2021-07-12 NOTE — Progress Notes (Signed)
? ? ?Office Visit  ?  ?Patient Name: Justin Carr ?Date of Encounter: 07/13/2021 ? ?Primary Care Provider:  Shirline Frees, MD ?Primary Cardiologist:  Quay Burow, MD ? ?Chief Complaint  ?  ?76 year old male with a history of CAD s/p CABG x3, postop atrial fibrillation, hypertension, hyperlipidemia, OSA on CPAP, type 2 diabetes and GERD who presents for follow-up related to CAD s/p CABG x3. ? ?Past Medical History  ?  ?Past Medical History:  ?Diagnosis Date  ? Coronary artery disease   ? GERD (gastroesophageal reflux disease)   ? Hyperlipidemia   ? Hypertension   ? NSTEMI (non-ST elevated myocardial infarction) ([place]) 06/2007  ? [name] 07/27/2010  ? OSA on CPAP   ? Palpitations   ? Pneumonia ~ 1971 X 1  ? Type II diabetes mellitus ([place])   ? ?Past Surgical History:  ?Procedure Laterality Date  ? APPENDECTOMY    ? CARDIOVASCULAR STRESS TEST  10/06/2007  ? No scintigraphic evidence of inducible myocardial ischemia. ECG positive for ischemia. Pharmacologically induced ST segment depression. Perfusion defect seen in inferior myocardial region consistent with diaphragmatic attenuation.  ? CORONARY ANGIOPLASTY WITH STENT PLACEMENT  07/03/2007  ? LAD 90% ulcerated plaque proximal between 1st and 2nd branches stented with a 2.75x39m Taxus Liberte stent deployed at 16atm. Postdilated at 16atm (3.049m. resulting in reduction of 90% lesion to 0% residual with TIMI3 flow. Impingement on theostium of diag branch dilatedat 6atm resulting in reduction of 90% ostial D2 to <50% residual.  ? CORONARY ANGIOPLASTY WITH STENT PLACEMENT  04/11/2017  ? CORONARY ARTERY BYPASS GRAFT N/A 05/17/2021  ? Procedure: CORONARY ARTERY BYPASS GRAFTING TIMES THREE USING LEFT INTERNAL  MAMMARY ARTERY AND RIGHT SAPHENOUS VEIN HARVESTED ENDOSCOPICALLY;  Surgeon: LiLajuana MatteMD;  [facility]: Open Heart Surgery;  Laterality: N/A;  ? INGUINAL HERNIA REPAIR Right 01/06/2018  ? Procedure: OPEN RIGHT INGUINAL HERNIA REPAIR WITH MESH;   Surgeon: GeArmandina GemmaMD;  Location: [facility] Service: General;  Laterality: Right;  ? INSERTION OF MESH Right 01/06/2018  ? Procedure: INSERTION OF MESH;  Surgeon: GeArmandina GemmaMD;  Location: [facility] Service: General;  Laterality: Right;  ? INTRAVASCULAR ULTRASOUND/IVUS N/A 05/11/2021  ? Procedure: Intravascular Ultrasound/IVUS;  Surgeon: BeLorretta HarpMD;  [facility];  Service: Cardiovascular;  Laterality: N/A;  ? LAPAROSCOPIC APPENDECTOMY N/A 04/01/2018  ? Procedure: APPENDECTOMY LAPAROSCOPIC;  Surgeon: GeArmandina GemmaMD;  Location: [facility];  Service: General;  Laterality: N/A;  ? LEFT HEART CATH AND CORONARY ANGIOGRAPHY N/A 04/11/2017  ? Procedure: LEFT HEART CATH AND CORONARY ANGIOGRAPHY;  Surgeon: BeLorretta HarpMD;  [facility];  Service: Cardiovascular;  Laterality: N/A;  ? LEFT HEART CATH AND CORONARY ANGIOGRAPHY N/A 05/11/2021  ? Procedure: LEFT HEART CATH AND CORONARY ANGIOGRAPHY;  Surgeon: BeLorretta HarpMD;  [facility];  Service: Cardiovascular;  Laterality: N/A;  ? LIGAMENT REPAIR Right 03/2001  ? wrist  ? RENAL ARTERIAL DOPPLER  05/20/2012  ? SMA and Cephalic artery >6>88%iameter reduction, Rt prox Renal artery 60-99% diameter reduction, Lft prox Renal artery 1-59% diameter reduction, kidneys are normal in size.  ? SHOULDER ARTHROSCOPY WITH SUBACROMIAL DECOMPRESSION Right 04/03/2013  ? Procedure: RIGHT SHOULDER ARTHROSCOPY WITH SUBACROMIAL DECOMPRESSION/DISTAL CLAVICLE RESECTION;  Surgeon: KeMarin ShutterMD;  [facility]: Orthopedics;  Laterality: Right;  ? TEE WITHOUT CARDIOVERSION N/A 05/17/2021  ? Procedure: TRANSESOPHAGEAL ECHOCARDIOGRAM (TEE);  Surgeon: LiLajuana Matte  MD;  Location: [facility];  Service: Open Heart Surgery;  Laterality: N/A;  ? TONSILLECTOMY AND ADENOIDECTOMY  ~ 1952  ? TRANSTHORACIC ECHOCARDIOGRAM  10/06/2007  ? EF 63%, normal.  ? WISDOM TOOTH EXTRACTION    ? age 61  ? ? ?Allergies ? ?Allergies   ?Allergen Reactions  ? Peanuts [Peanut Oil] Anaphylaxis  ? Lipitor [Atorvastatin] Other (See Comments)  ?  Muscle cramps  ? Rosuvastatin Other (See Comments)  ?  Muscle cramps if taken daily-pt still takes drug, but takes 3 times a week.  ? ? ?History of Present Illness  ?  ?76 year old male with the above past medical history including CAD s/p CABG x3, postop atrial fibrillation, hypertension, hyperlipidemia, OSA on CPAP, type 2 diabetes and GERD. ?  ?He has a history of CAD and NSTEMI in 2009.  He underwent PCI and stenting of his pLAD at the time.  He also had a 60% right renal artery stenosis documented at the time, which has been followed by duplex ultrasound. Lexiscan Myoview in January 2019 showed ischemia in the LAD territory. Cardiac catheterization in January 2018 showed 99% in-stent restenosis to the pLAD stent, treated with a DES.  He was last seen in the office on 05/10/2021 and reported left shoulder pain, similar to prior anginal equivalent.  Cardiac catheterization on 05/11/2021 showed patent p-mLAD stents, oLM-LM 70%, mLAD 30%, and pRCA 75 %, EF 50-55%.  Echocardiogram showed EF 60 to 65%, G1 DD, aortic valve sclerosis without evidence of aortic stenosis.  He was referred to CT surgery for consideration of CABG and ultimately underwent CABG x3 (LIMA-LAD, SVG-OM, PDA) on 05/17/2021.  He was hospitalized from 05/17/2021 to 05/22/2021.  He did have postop atrial fibrillation with RVR, and was started on amiodarone and Eliquis.  He was discharged home in stable condition on 05/22/2021.  He saw Dr. Kipp Brood virtually on 05/26/2021 and was stable overall from a cardiac standpoint.  His home health nurse noted bilateral lower extremity edema and weight gain of 2.7lbs, following discharge and he was subsequently was started on Lasix 40 mg daily for 1 week, with as needed use for weight gain, edema thereafter.  He was last seen in the office on 06/13/2021 and reported some mild bilateral lower extremity edema, mild  incisional pain, generalized fatigue.  Metoprolol was decreased. He saw CT surgery on 06/29/2021 and was advised to follow-up as needed.  ?  ?He presents today for follow-up. He has done well from a cardiac standpoint. He is walking regularly, up to 2 miles at a time. He denies symptoms concerning for angina.  His fatigue has improved with the reduced dose of metoprolol.  He does report 1 brief episode of a "fast heart rate" that occurred while he was in bed and lasted less than 5 minutes, he denies any associated symptoms.  He denies any additional palpitations, dyspnea, edema, PND, orthopnea.  He does have some mild incisional pain, however, this continues to improve.  Overall, he reports feeling well and denies any additional concerns today. ? ?Home Medications  ?  ?Current Outpatient Medications  ?Medication Sig Dispense Refill  ? acetaminophen (TYLENOL) 500 MG tablet Take 1-2 tablets (500-1,000 mg total) by mouth every 6 (six) hours as needed for mild pain or fever. 30 tablet 0  ? Alpha-Lipoic Acid 300 MG CAPS Take 300 mg by mouth in the morning and at bedtime.    ? amiodarone (PACERONE) 100 MG tablet Take 1 tablet (100 mg total) by mouth daily. [place]  tablet 3  ? apixaban (ELIQUIS) 5 MG TABS tablet Take 1 tablet (5 mg total) by mouth 2 (two) times daily. 60 tablet 1  ? aspirin 81 MG tablet Take 1 tablet (81 mg total) by mouth daily. (Patient taking differently: Take 81 mg by mouth at bedtime.) 90 tablet 3  ? Cholecalciferol (VITAMIN D) 50 MCG (2000 UT) tablet Take 2,000 Units by mouth daily.    ? Coenzyme Q10 (COQ-10 PO) Take 1 capsule by mouth at bedtime.     ? dapagliflozin propanediol (FARXIGA) 5 MG TABS tablet Take 5 mg by mouth daily.    ? famotidine (PEPCID) 20 MG tablet Take 1 tablet (20 mg total) by mouth 2 (two) times daily. 60 tablet 6  ? metFORMIN (GLUCOPHAGE) 500 MG tablet Take 500 mg by mouth 2 (two) times daily.    ? metoprolol tartrate (LOPRESSOR) 25 MG tablet Take 0.5 tablets (12.5 mg total) by mouth  2 (two) times daily. New Rx dosage. Pt is stopping 25 mg bid. 60 tablet 3  ? Multiple Vitamin (MULTIVITAMIN WITH MINERALS) TABS tablet Take 1 tablet by mouth daily.    ? Multiple Vitamins-Minerals (PR

## 2021-07-13 ENCOUNTER — Encounter: Payer: Self-pay | Admitting: Nurse Practitioner

## 2021-07-13 ENCOUNTER — Ambulatory Visit (INDEPENDENT_AMBULATORY_CARE_PROVIDER_SITE_OTHER): Payer: Medicare Other | Admitting: Nurse Practitioner

## 2021-07-13 VITALS — BP 128/70 | HR 63 | Resp 20 | Ht 70.0 in | Wt 207.0 lb

## 2021-07-13 DIAGNOSIS — I480 Paroxysmal atrial fibrillation: Secondary | ICD-10-CM | POA: Diagnosis not present

## 2021-07-13 DIAGNOSIS — I2510 Atherosclerotic heart disease of native coronary artery without angina pectoris: Secondary | ICD-10-CM | POA: Diagnosis not present

## 2021-07-13 DIAGNOSIS — I10 Essential (primary) hypertension: Secondary | ICD-10-CM

## 2021-07-13 DIAGNOSIS — E785 Hyperlipidemia, unspecified: Secondary | ICD-10-CM | POA: Diagnosis not present

## 2021-07-13 DIAGNOSIS — E119 Type 2 diabetes mellitus without complications: Secondary | ICD-10-CM

## 2021-07-13 DIAGNOSIS — G4733 Obstructive sleep apnea (adult) (pediatric): Secondary | ICD-10-CM

## 2021-07-13 MED ORDER — AMIODARONE HCL 100 MG PO TABS: 100.0000 mg | ORAL_TABLET | Freq: Every day | ORAL | 3 refills | Status: DC

## 2021-07-13 NOTE — Patient Instructions (Signed)
Medication Instructions:  ?Decrease Amiodarone 100 mg daily  ?*If you need a refill on your cardiac medications before your next appointment, please call your pharmacy* ? ? ?Lab Work: ?NONE ordered at this time of appointment  ? ?If you have labs (blood work) drawn today and your tests are completely normal, you will receive your results only by: ?MyChart Message (if you have MyChart) OR ?A paper copy in the mail ?If you have any lab test that is abnormal or we need to change your treatment, we will call you to review the results. ? ? ?Testing/Procedures: ?NONE ordered at this time of appointment  ? ? ? ?Follow-Up: ?At [facility], you and your health needs are our priority.  As part of our continuing mission to provide you with exceptional heart care, we have created designated Provider Care Teams.  These Care Teams include your primary Cardiologist (physician) and Advanced Practice Providers (APPs -  Physician Assistants and Nurse Practitioners) who all work together to provide you with the care you need, when you need it. ? ?We recommend signing up for the patient portal called "MyChart".  Sign up information is provided on this After Visit Summary.  MyChart is used to connect with patients for Virtual Visits (Telemedicine).  Patients are able to view lab/test results, encounter notes, upcoming appointments, etc.  Non-urgent messages can be sent to your provider as well.   ?To learn more about what you can do with MyChart, go to [email or web address].   ? ?Your next appointment:   ?3 month(s) ? ?The format for your next appointment:   ?In Person ? ?Provider:   ?Quay Burow, MD   ? ? ?Other Instructions ? ? ?Important Information About Sugar ? ? ? ? ? ? ?

## 2021-07-20 ENCOUNTER — Other Ambulatory Visit: Payer: Self-pay | Admitting: Cardiovascular Disease

## 2021-07-20 ENCOUNTER — Other Ambulatory Visit: Payer: Self-pay | Admitting: Physician Assistant

## 2021-07-20 ENCOUNTER — Other Ambulatory Visit: Payer: Self-pay

## 2021-07-20 MED ORDER — APIXABAN 5 MG PO TABS: 5.0000 mg | ORAL_TABLET | Freq: Two times a day (BID) | ORAL | 5 refills | Status: DC

## 2021-07-20 NOTE — Telephone Encounter (Signed)
Prescription refill request for Eliquis received. ?Indication:Afib ?Last office visit:4/23 ?Scr:1.0 ?Age: 76 ?Weight:93.9 kg ? ?Prescription refilled ? ?

## 2021-07-31 ENCOUNTER — Other Ambulatory Visit: Payer: Self-pay

## 2021-07-31 DIAGNOSIS — Z951 Presence of aortocoronary bypass graft: Secondary | ICD-10-CM

## 2021-08-01 ENCOUNTER — Ambulatory Visit (INDEPENDENT_AMBULATORY_CARE_PROVIDER_SITE_OTHER): Payer: Self-pay | Admitting: Surgical

## 2021-08-01 ENCOUNTER — Ambulatory Visit
Admission: RE | Admit: 2021-08-01 | Discharge: 2021-08-01 | Disposition: A | Discharge status: Home / Self Care | Payer: Medicare Other | Source: Ambulatory Visit | Attending: Thoracic Surgery (Cardiothoracic Vascular Surgery) | Admitting: Thoracic Surgery (Cardiothoracic Vascular Surgery)

## 2021-08-01 ENCOUNTER — Encounter: Payer: Self-pay | Admitting: Surgical

## 2021-08-01 VITALS — BP 129/72 | HR 64 | Temp 98.5°F | Resp 20 | Ht 70.0 in | Wt 210.0 lb

## 2021-08-01 DIAGNOSIS — Z9889 Other specified postprocedural states: Secondary | ICD-10-CM | POA: Diagnosis not present

## 2021-08-01 DIAGNOSIS — Z951 Presence of aortocoronary bypass graft: Secondary | ICD-10-CM

## 2021-08-01 DIAGNOSIS — Z01818 Encounter for other preprocedural examination: Secondary | ICD-10-CM | POA: Diagnosis not present

## 2021-08-01 DIAGNOSIS — R072 Precordial pain: Secondary | ICD-10-CM | POA: Diagnosis not present

## 2021-08-01 DIAGNOSIS — R918 Other nonspecific abnormal finding of lung field: Secondary | ICD-10-CM | POA: Diagnosis not present

## 2021-08-01 DIAGNOSIS — I2510 Atherosclerotic heart disease of native coronary artery without angina pectoris: Secondary | ICD-10-CM

## 2021-08-01 NOTE — Progress Notes (Signed)
? ?   ?[address] ?      [place] [address] ?            [phone]   ? ?  ?[name] ?[place] Record #[id number] ?Date of Birth: December 18, 1945 ? ?Referring: [name], MD ?Primary Care: Shirline Frees, MD ?Primary Cardiologist: Quay Burow, MD ? ? ?Chief Complaint:   POST OP FOLLOW UP ?  ?05/17/2021 ?Patient:  Justin Carr ?Pre-Op Dx: Coronary artery disease ?                        Diabetes mellitus ?                        Hypertension ?                        Hyperlipidemia ?Post-op Dx: Same ?Procedure: ?CABG X 3.  LIMA to LAD, reverse saphenous vein graft to obtuse marginal, PDA ?Endoscopic greater saphenous vein harvest on the right ?  ?  ?Surgeon and Role:   ?   * Lajuana Matte, MD - Primary ?   *M. Roddenberry, PA-C- assisting ? ? ? ?History of Present Illness:    Seeing the patient on today's date for follow-up of the above described procedure.  Overall he continues to progress well but approximately 2 days ago developed some increased burning type set station in the lower portion of the sternum incision.  He has not had any anginal equivalents or shortness of breath.  He denies palpitations with the exception of an event that occurred when he was sleeping, so he is not sure if maybe it was a dream..  He has had no difficulties with the incisions and denies fevers, chills, sweats or other clinical symptoms of infection.  He is ambulating well, approximately 2 miles a day.  He has been unable to start cardiac rehab as there is apparently a 38-monthbackup.  Overall he is pleased with his general progress and recovery. ? ? ? ?  ?Past Medical History:  ?Diagnosis Date  ? Coronary artery disease   ? GERD (gastroesophageal reflux disease)   ? Hyperlipidemia   ? Hypertension   ? [place] [place]  ? /[name]5/05/2010  ? OSA on CPAP   ? Palpitations   ? Pneumonia ~ 1971 X 1  ? Type II diabetes mellitus ([facility]   ? ? ? ?Social  History  ? ?Tobacco Use  ?Smoking Status Former  ? Packs/day: 1.00  ? Years: 34.00  ? Pack years: 34.00  ? Types: Cigarettes  ? Quit date: 07/03/2007  ? Years since quitting: 14.0  ?Smokeless Tobacco Never  ?  ?Social History  ? ?Substance and Sexual Activity  ?Alcohol Use Yes  ? Comment: 04/11/2017 "might have a beer q other week"  ? ? ? ?Allergies  ?Allergen Reactions  ? Peanuts [Peanut Oil] Anaphylaxis  ? Lipitor [Atorvastatin] Other (See Comments)  ?  Muscle cramps  ? Rosuvastatin Other (See Comments)  ?  Muscle cramps if taken daily-pt still takes drug, but takes 3 times a week.  ? ? ?Current Outpatient Medications  ?Medication Sig Dispense Refill  ? Alpha-Lipoic Acid 300 MG CAPS Take 300 mg by mouth in the morning and at bedtime.    ? amiodarone (PACERONE) 100 MG tablet Take 1 tablet (100 mg total) by mouth daily.  30 tablet 3  ? apixaban (ELIQUIS) 5 MG TABS tablet Take 1 tablet (5 mg total) by mouth 2 (two) times daily. 60 tablet 5  ? aspirin 81 MG tablet Take 1 tablet (81 mg total) by mouth daily. (Patient taking differently: Take 81 mg by mouth at bedtime.) 90 tablet 3  ? Cholecalciferol (VITAMIN D) 50 MCG (2000 UT) tablet Take 2,000 Units by mouth daily.    ? Coenzyme Q10 (COQ-10 PO) Take 1 capsule by mouth at bedtime.     ? dapagliflozin propanediol (FARXIGA) 5 MG TABS tablet Take 5 mg by mouth daily.    ? famotidine (PEPCID) 20 MG tablet Take 1 tablet (20 mg total) by mouth 2 (two) times daily. 60 tablet 6  ? metFORMIN (GLUCOPHAGE) 500 MG tablet Take 500 mg by mouth 2 (two) times daily.    ? Multiple Vitamin (MULTIVITAMIN WITH MINERALS) TABS tablet Take 1 tablet by mouth daily.    ? Multiple Vitamins-Minerals (PRESERVISION AREDS PO) Take 1 capsule by mouth 2 (two) times daily.    ? nitroGLYCERIN (NITROSTAT) 0.4 MG SL tablet DISSOLVE ONE TABLET UNDER THE TONGUE EVERY 5 MINUTES AS NEEDED FOR CHEST PAIN.  DO NOT EXCEED A TOTAL OF 3 DOSES IN 15 MINUTES 25 tablet 1  ? pregabalin (LYRICA) 100 MG capsule Take 100  mg by mouth 2 (two) times daily.    ? ramipril (ALTACE) 10 MG capsule Take 1 capsule by mouth once daily 90 capsule 3  ? rosuvastatin (CRESTOR) 20 MG tablet TAKE 1 TABLET BY MOUTH THREE TIMES A WEEK 36 tablet 0  ? sildenafil (VIAGRA) 100 MG tablet Take 100 mg by mouth daily as needed for erectile dysfunction.    ? traMADol (ULTRAM) 50 MG tablet tramadol 50 mg tablet ? TAKE 1 TABLET BY MOUTH EVERY 4 HOURS AS NEEDED FOR MODERATE PAIN    ? TRULICITY 3.15 VV/6.1YW SOPN Inject 0.75 mg into the skin every Monday.    ? acetaminophen (TYLENOL) 500 MG tablet Take 1-2 tablets (500-1,000 mg total) by mouth every 6 (six) hours as needed for mild pain or fever. (Patient not taking: Reported on 08/01/2021) 30 tablet 0  ? furosemide (LASIX) 40 MG tablet Take 1 tablet (40 mg total) by mouth daily. Take 1 tablet by mouth daily for 7 days. After 7 days, take 1 tablet as needed for weight gain of 3 lbs or more in one day. 30 tablet 0  ? metoprolol tartrate (LOPRESSOR) 25 MG tablet Take 0.5 tablets (12.5 mg total) by mouth 2 (two) times daily. New Rx dosage. Pt is stopping 25 mg bid. 60 tablet 3  ? potassium chloride SA (KLOR-CON M) 20 MEQ tablet potassium chloride ER 20 mEq tablet,extended release(part/cryst) ? TAKE 1 TABLET BY MOUTH ONCE DAILY (Patient not taking: Reported on 08/01/2021)    ? ?No current facility-administered medications for this visit.  ? ? ? ? ? ?Physical Exam: ?BP 129/72   Pulse 64   Temp 98.5 ?F (36.9 ?C) (Oral)   Resp 20   Ht '5\' 10"'$  (1.778 m)   Wt 210 lb (95.3 kg)   SpO2 95% Comment: RA  BMI 30.13 kg/m?  ? ?General appearance: alert, cooperative, and no distress ?Heart: regular rate and rhythm ?Lungs: clear to auscultation bilaterally ?Wound: Well-healed without evidence of infection. ? ? ?Diagnostic Studies & Laboratory data: ?   ? Recent Radiology Findings:  ? DG Chest 2 View ? ?Result Date: 08/01/2021 ?CLINICAL DATA:  Sternal pain at bottom of incision site.  EXAM: CHEST - 2 VIEW COMPARISON:  06/29/2021  FINDINGS: Postoperative changes from median sternotomy. Sternotomy wires appear intact. No plain film radiographic evidence for sternal dehiscence. Smoothly marginated opacity is identified on the lateral projection radiograph posterior to the distal body of sternum. This measures approximately 8.4 x 4.1 cm and appears more sharply marginated when compared with 06/29/2021. This is new when compared with the preop chest radiograph from 05/16/2021. The no signs of pleural effusion, pneumothorax or edema identified. No airspace densities. IMPRESSION: 1. Indeterminate smoothly marginated 8.4 x 4.1 cm opacity posterior to the distal body of the sternum. Favor postoperative change. If there is a clinical concern for retrosternal fluid collection contrast enhanced CT of the chest may be helpful for further delineation. Electronically Signed   By: Kerby Moors M.D.   On: 08/01/2021 14:59   ?  ? ?Recent Lab Findings: ?Lab Results  ?Component Value Date  ? WBC 8.6 06/13/2021  ? HGB 12.3 (L) 06/13/2021  ? HCT 36.1 (L) 06/13/2021  ? PLT 253 06/13/2021  ? GLUCOSE 104 (H) 06/19/2021  ? CHOL 73 05/19/2021  ? TRIG 96 05/19/2021  ? HDL 25 (L) 05/19/2021  ? [place] 29 05/19/2021  ? ALT 28 05/16/2021  ? AST 18 05/16/2021  ? NA 139 06/19/2021  ? K 5.2 06/19/2021  ? CL 100 06/19/2021  ? CREATININE 1.09 06/19/2021  ? BUN 12 06/19/2021  ? CO2 26 06/19/2021  ? TSH 3.914 05/20/2021  ? INR 1.4 (H) 05/17/2021  ? HGBA1C 6.1 (H) 05/16/2021  ? ? ? ? ?Assessment / Plan: The patient appears to be overall doing well in his postsurgical recovery.  Not certain with the exacerbation and the sternal discomfort is.  I reviewed the chest x-ray with Dr. Roxan Hockey and he feels as though the findings are most likely postsurgical and not worrisome unless there is some concern of infection which there does not appear to be.  Preop ejection fraction was normal and he has no findings consistent with heart failure.  I encouraged him to continue his exercise  regimen and went over activity progression.  I did not make any changes to his current medication regimen.  We will see him again on a as needed basis for any surgically related needs or at request. ?   ? ? ?[facility]

## 2021-08-01 NOTE — Patient Instructions (Signed)
Discussed routine activity progression °

## 2021-08-02 ENCOUNTER — Other Ambulatory Visit: Payer: Self-pay | Admitting: Cardiovascular Disease

## 2021-08-07 ENCOUNTER — Other Ambulatory Visit: Payer: Self-pay

## 2021-08-07 DIAGNOSIS — Z122 Encounter for screening for malignant neoplasm of respiratory organs: Secondary | ICD-10-CM

## 2021-08-07 DIAGNOSIS — Z87891 Personal history of nicotine dependence: Secondary | ICD-10-CM

## 2021-08-08 DIAGNOSIS — L821 Other seborrheic keratosis: Secondary | ICD-10-CM | POA: Diagnosis not present

## 2021-08-08 DIAGNOSIS — L814 Other melanin hyperpigmentation: Secondary | ICD-10-CM | POA: Diagnosis not present

## 2021-08-08 DIAGNOSIS — D1801 Hemangioma of skin and subcutaneous tissue: Secondary | ICD-10-CM | POA: Diagnosis not present

## 2021-08-16 ENCOUNTER — Ambulatory Visit
Admission: RE | Admit: 2021-08-16 | Discharge: 2021-08-16 | Disposition: A | Discharge status: Home / Self Care | Payer: Medicare Other | Source: Ambulatory Visit | Attending: Family Medicine | Admitting: Family Medicine

## 2021-08-16 DIAGNOSIS — Z87891 Personal history of nicotine dependence: Secondary | ICD-10-CM

## 2021-08-16 DIAGNOSIS — Z122 Encounter for screening for malignant neoplasm of respiratory organs: Secondary | ICD-10-CM

## 2021-08-18 ENCOUNTER — Telehealth: Payer: Self-pay | Admitting: Acute Care

## 2021-08-18 DIAGNOSIS — R911 Solitary pulmonary nodule: Secondary | ICD-10-CM

## 2021-08-18 DIAGNOSIS — Z87891 Personal history of nicotine dependence: Secondary | ICD-10-CM

## 2021-08-18 NOTE — Telephone Encounter (Signed)
I have called the patient with the results of his low Dose CT Chest. I explained that there is one nodule that measures 5.9 mm, likely infectious or inflammatory per radiology. The scan was read as a Lung  RADS 3, nodules that are probably benign findings, short term follow up suggested: includes nodules with a low likelihood of becoming a clinically active cancer. Radiology recommends a 6 month repeat LDCT follow up. Pt. Is in agreement with a 6 month follow up scan. I told him we will call closer to the time to get it scheduled. He verbalized understanding and had no further questions at completion of the call.  Langley Gauss, please fax results to PCP and order 6 month low dose CT Chest follow up. Thanks so much

## 2021-08-18 NOTE — Telephone Encounter (Signed)
Results/plan faxed to PCP.  Order placed for CT Chest nodule follow up.

## 2021-08-18 NOTE — Telephone Encounter (Signed)
Received call report for patient's lung cancer screening CT. Below is a copy of the impressions:   IMPRESSION: 1. Lung-RADS 3, probably benign findings. New 5.9 mm posterior right upper lobe pulmonary nodule, likely infectious/inflammatory. Short-term follow-up in 6 months is recommended with repeat low-dose chest CT without contrast (please use the following order, "CT CHEST LCS NODULE FOLLOW-UP W/O CM"). 2.  Emphysema (ICD10-J43.9) and Aortic Atherosclerosis (ICD10-170.0)  Judson Roch, can you please advise? Thanks!

## 2021-08-25 ENCOUNTER — Telehealth (HOSPITAL_COMMUNITY): Payer: Self-pay

## 2021-08-25 ENCOUNTER — Encounter (HOSPITAL_COMMUNITY): Payer: Self-pay

## 2021-08-25 NOTE — Telephone Encounter (Signed)
Attempted to call patient in regards to Cardiac Rehab - LM on VM Mailed letter 

## 2021-08-31 ENCOUNTER — Telehealth (HOSPITAL_COMMUNITY): Payer: Self-pay

## 2021-08-31 NOTE — Telephone Encounter (Signed)
Pt is not interested in the cardiac rehab program. Closed referral 

## 2021-09-07 DIAGNOSIS — E1140 Type 2 diabetes mellitus with diabetic neuropathy, unspecified: Secondary | ICD-10-CM | POA: Diagnosis not present

## 2021-09-07 DIAGNOSIS — I10 Essential (primary) hypertension: Secondary | ICD-10-CM | POA: Diagnosis not present

## 2021-09-07 DIAGNOSIS — E1165 Type 2 diabetes mellitus with hyperglycemia: Secondary | ICD-10-CM | POA: Diagnosis not present

## 2021-09-07 DIAGNOSIS — E7800 Pure hypercholesterolemia, unspecified: Secondary | ICD-10-CM | POA: Diagnosis not present

## 2021-09-20 ENCOUNTER — Encounter: Payer: Self-pay | Admitting: Cardiology

## 2021-10-18 ENCOUNTER — Other Ambulatory Visit: Payer: Self-pay | Admitting: Cardiovascular Disease

## 2021-10-24 ENCOUNTER — Ambulatory Visit (INDEPENDENT_AMBULATORY_CARE_PROVIDER_SITE_OTHER): Payer: Medicare Other | Admitting: Cardiovascular Disease

## 2021-10-24 ENCOUNTER — Encounter: Payer: Self-pay | Admitting: Cardiovascular Disease

## 2021-10-24 ENCOUNTER — Ambulatory Visit (INDEPENDENT_AMBULATORY_CARE_PROVIDER_SITE_OTHER): Payer: Medicare Other

## 2021-10-24 VITALS — BP 118/66 | HR 55 | Ht 70.0 in | Wt 211.8 lb

## 2021-10-24 DIAGNOSIS — I2510 Atherosclerotic heart disease of native coronary artery without angina pectoris: Secondary | ICD-10-CM

## 2021-10-24 DIAGNOSIS — I480 Paroxysmal atrial fibrillation: Secondary | ICD-10-CM

## 2021-10-24 DIAGNOSIS — I10 Essential (primary) hypertension: Secondary | ICD-10-CM

## 2021-10-24 DIAGNOSIS — E782 Mixed hyperlipidemia: Secondary | ICD-10-CM

## 2021-10-24 NOTE — Assessment & Plan Note (Signed)
History of perioperative PAF discharged home on amiodarone and Eliquis.  He has had no recurrence.  I am going to stop the amiodarone and check a 1 week Zio patch.  If this shows no A-fib we will stop the Eliquis as well.

## 2021-10-24 NOTE — Assessment & Plan Note (Signed)
History of essential hypertension blood pressure measured today at 118/66.  He is on low-dose metoprolol and ramipril.

## 2021-10-24 NOTE — Progress Notes (Signed)
10/24/2021 Justin Carr   [birth year]  [id number]  Primary Physician Shirline Frees, MD Primary Cardiologist: [name] MD FACP, [place], [place], [place]  HPI:  Justin Carr is a 76 y.o.  mildly overweight married Caucasian male, father of 2 children (1 living), who I  I last saw in the office 05/10/2021. 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 His problems include obstructive sleep apnea, on CPAP, hypertension, and hyperlipidemia. He denies chest pain or shortness of breath. He had a non-ST-segment-elevation myocardial infarction, July 02, 2007. He underwent PCI and stenting of his proximal LAD with a Taxus Liberte drug-eluting stent. He had also had a 60% right renal artery stenosis documented angiographically at that time, which we have been following by duplex ultrasound. Since I saw him back a year ago he denies chest pain or shortness of breath but has noticed some left upper extremity discomfort with exertion similar to his pre-MI symptoms. Marland Kitchen    He  underwent Myoview stress testing 04/04/17 that showed ischemia in the LAD territory. I performed radial diagnostic cath on him 04/11/17 revealed a 99% "in-stent restenosis" within the proximal LAD stent. Remainder of his coronary anatomy was free of significant disease in his LV function was normal. I restarted him with a 3 mm x 20 mm long synergy drug-eluting stent postdilated to 3.23 mm resulting reduction a 9% stenosis to 0% residual. He has done well since.   He underwent blepharoplasty and after the several weeks post procedure has noticed some discomfort in his left bicep and has occurred both at rest and with exertion.  These symptoms are similar to his preintervention symptoms 4 years ago.  Based on this I decided to proceed with outpatient diagnostic coronary angiography 05/11/2021 revealing a patent proximal LAD stent with 70% ostial  left main and 75% proximal RCA stenosis.  He ultimately underwent CABG x3 by Dr. Kipp Brood 05/17/2021 with a LIMA to his LAD, vein to an obtuse marginal branch and PDA.  He did well postoperatively.  He is very active and currently is asymptomatic.  Did have perioperative PAF and was discharged home on amiodarone and Eliquis.  He had no recurrence.   Current Meds  Medication Sig   Alpha-Lipoic Acid 300 MG CAPS Take 300 mg by mouth in the morning and at bedtime.   apixaban (ELIQUIS) 5 MG TABS tablet Take 1 tablet (5 mg total) by mouth 2 (two) times daily.   aspirin 81 MG tablet Take 1 tablet (81 mg total) by mouth daily. (Patient taking differently: Take 81 mg by mouth at bedtime.)   Cholecalciferol (VITAMIN D) 50 MCG (2000 UT) tablet Take 2,000 Units by mouth daily.   Coenzyme Q10 (COQ-10 PO) Take 1 capsule by mouth at bedtime.    dapagliflozin propanediol (FARXIGA) 5 MG TABS tablet Take 5 mg by mouth daily.   famotidine (PEPCID) 20 MG tablet Take 1 tablet (20 mg total) by mouth 2 (two) times daily.   metFORMIN (GLUCOPHAGE) 500 MG tablet Take 500 mg by mouth 2 (two) times daily.   metoprolol tartrate (LOPRESSOR) 25 MG tablet Take 0.5 tablets (12.5 mg total) by mouth 2 (two) times daily. New Rx dosage. Pt is stopping 25 mg bid.   Multiple Vitamin (MULTIVITAMIN WITH MINERALS) TABS tablet Take 1 tablet by mouth daily.   Multiple Vitamins-Minerals (PRESERVISION AREDS PO) Take 1 capsule by mouth 2 (two)  times daily.   nitroGLYCERIN (NITROSTAT) 0.4 MG SL tablet DISSOLVE ONE TABLET UNDER THE TONGUE EVERY 5 MINUTES AS NEEDED FOR CHEST PAIN.  DO NOT EXCEED A TOTAL OF 3 DOSES IN 15 MINUTES   pregabalin (LYRICA) 100 MG capsule Take 100 mg by mouth 2 (two) times daily.   ramipril (ALTACE) 10 MG capsule Take 1 capsule by mouth once daily   rosuvastatin (CRESTOR) 20 MG tablet TAKE 1 TABLET BY MOUTH THREE TIMES A WEEK   sildenafil (VIAGRA) 100 MG tablet Take 100 mg by mouth daily as needed for erectile  dysfunction.   TRULICITY 5.36 RW/4.3XV SOPN Inject 0.75 mg into the skin every Monday.   [DISCONTINUED] amiodarone (PACERONE) 100 MG tablet Take 1 tablet (100 mg total) by mouth daily.     Allergies  Allergen Reactions   Peanuts [Peanut Oil] Anaphylaxis   Lipitor [Atorvastatin] Other (See Comments)    Muscle cramps   Rosuvastatin Other (See Comments)    Muscle cramps if taken daily-pt still takes drug, but takes 3 times a week.    Social History   Socioeconomic History   Marital status: Married    Spouse name: Not on file   Number of children: 2   Years of education: College +   Highest education level: Not on file  Occupational History   Occupation: Retired  Tobacco Use   Smoking status: Former    Packs/day: 1.00    Years: 34.00    Total pack years: 34.00    Types: Cigarettes    Quit date: 07/03/2007    Years since quitting: 14.3   Smokeless tobacco: Never  Vaping Use   Vaping Use: Never used  Substance and Sexual Activity   Alcohol use: Yes    Comment: 04/11/2017 "might have a beer q other week"   Drug use: No   Sexual activity: Not Currently  Other Topics Concern   Not on file  Social History Narrative   1 cup coffee and 2 sodas per day.   Lives at home with his wife.   Right-handed.   Social Determinants of Health   Financial Resource Strain: Low Risk  (05/28/2017)   Overall Financial Resource Strain (CARDIA)    Difficulty of Paying Living Expenses: Not hard at all  Food Insecurity: No Food Insecurity (05/28/2017)   Hunger Vital Sign    Worried About Running Out of Food in the Last Year: Never true    Ran Out of Food in the Last Year: Never true  Transportation Needs: No Transportation Needs (05/28/2017)   PRAPARE - Hydrologist (Medical): No    Lack of Transportation (Non-Medical): No  Physical Activity: Insufficiently Active (05/28/2017)   Exercise Vital Sign    Days of Exercise per Week: 3 days    Minutes of Exercise per Session:  30 min  Stress: No Stress Concern Present (05/28/2017)   [place]    Feeling of Stress : Not at all  Social Connections: Not on file  Intimate Partner Violence: Not on file     Review of Systems: General: negative for chills, fever, night sweats or weight changes.  Cardiovascular: negative for chest pain, dyspnea on exertion, edema, orthopnea, palpitations, paroxysmal nocturnal dyspnea or shortness of breath Dermatological: negative for rash Respiratory: negative for cough or wheezing Urologic: negative for hematuria Abdominal: negative for nausea, vomiting, diarrhea, bright red blood per rectum, melena, or hematemesis Neurologic: negative for visual changes, syncope,  or dizziness All other systems reviewed and are otherwise negative except as noted above.    Blood pressure 118/66, pulse (!) 55, height '5\' 10"'$  (1.778 m), weight 211 lb 12.8 oz (96.1 kg), SpO2 96 %.  General appearance: alert and no distress Neck: no adenopathy, no carotid bruit, no JVD, supple, symmetrical, trachea midline, and thyroid not enlarged, symmetric, no tenderness/mass/nodules Lungs: clear to auscultation bilaterally Heart: regular rate and rhythm, S1, S2 normal, no murmur, click, rub or gallop Extremities: extremities normal, atraumatic, no cyanosis or edema Pulses: 2+ and symmetric Skin: Skin color, texture, turgor normal. No rashes or lesions Neurologic: Grossly normal  EKG not performed today  ASSESSMENT AND PLAN:   Coronary artery disease History of CAD status post non-STEMI/10/2007.  He underwent PCI and stenting of his proximal LAD with a Taxus Leberte drug-eluting stent.  He had a Myoview stress test performed 04/04/2017 and showed ischemia in the LAD territory.  I performed radial diagnostic cath on him 04/11/2017 revealing a 99% "in-stent restenosis within the proximal LAD stent.  The remainder of his coronary anatomy was free of  significant disease and his LV function was normal.  I restented him with a 3 mm x 20 mm long Synergy drug-eluting stent postdilated to 3.23 mm.  Because of recurrent symptoms of pain in his left bicep both at rest and with exertion similar to his preintervention symptoms for years prior I performed catheterization on him 05/11/2021 revealing 70% left main disease as well as 75% proximal RCA disease.  He ultimately underwent CABG x3 by Dr. Kipp Brood 05/17/2021 with a LIMA to his LAD, vein to the first obtuse marginal branch and to the PDA.  He did well postoperatively.  He is completely asymptomatic currently.  Essential hypertension History of essential hypertension blood pressure measured today at 118/66.  He is on low-dose metoprolol and ramipril.  Hyperlipidemia History of hyperlipidemia on statin therapy with lipid profile performed 05/15/2021 revealing total cholesterol 73, LDL 29 and HDL of 25.  Paroxysmal atrial fibrillation (HCC) History of perioperative PAF discharged home on amiodarone and Eliquis.  He has had no recurrence.  I am going to stop the amiodarone and check a 1 week Zio patch.  If this shows no A-fib we will stop the Eliquis as well.     [name] MD FACP,FACC,FAHA, [facility] 10/24/2021 12:32 PM

## 2021-10-24 NOTE — Progress Notes (Unsigned)
Enrolled for [employer] to mail a ZIO XT long term holter monitor to the patients address on file.  

## 2021-10-24 NOTE — Assessment & Plan Note (Signed)
History of CAD status post non-STEMI/10/2007.  He underwent PCI and stenting of his proximal LAD with a Taxus Leberte drug-eluting stent.  He had a Myoview stress test performed 04/04/2017 and showed ischemia in the LAD territory.  I performed radial diagnostic cath on him 04/11/2017 revealing a 99% "in-stent restenosis within the proximal LAD stent.  The remainder of his coronary anatomy was free of significant disease and his LV function was normal.  I restented him with a 3 mm x 20 mm long Synergy drug-eluting stent postdilated to 3.23 mm.  Because of recurrent symptoms of pain in his left bicep both at rest and with exertion similar to his preintervention symptoms for years prior I performed catheterization on him 05/11/2021 revealing 70% left main disease as well as 75% proximal RCA disease.  He ultimately underwent CABG x3 by Dr. Kipp Brood 05/17/2021 with a LIMA to his LAD, vein to the first obtuse marginal branch and to the PDA.  He did well postoperatively.  He is completely asymptomatic currently.

## 2021-10-24 NOTE — Patient Instructions (Signed)
Medication Instructions:  STOP: AMIODARONE *If you need a refill on your cardiac medications before your next appointment, please call your pharmacy*  Lab Work: None Ordered At This Time.  If you have labs (blood work) drawn today and your tests are completely normal, you will receive your results only by: [place] (if you have MyChart) OR A paper copy in the mail If you have any lab test that is abnormal or we need to change your treatment, we will call you to review the results.  Testing/Procedures:  [facility]- Long Term Monitor Instructions   Your physician has requested you wear your ZIO patch monitor___7____days.   This is a single patch monitor.  [employer] supplies one patch monitor per enrollment.  Additional stickers are not available.   Please do not apply patch if you will be having a Nuclear Stress Test, Echocardiogram, Cardiac CT, MRI, or Chest Xray during the time frame you would be wearing the monitor. The patch cannot be worn during these tests.  You cannot remove and re-apply the ZIO XT patch monitor.   Your ZIO patch monitor will be sent USPS Priority mail from [facility] directly to your home address. The monitor may also be mailed to a PO BOX if home delivery is not available.   It may take 3-5 days to receive your monitor after you have been enrolled.   Once you have received you monitor, please review enclosed instructions.  Your monitor has already been registered assigning a specific monitor serial # to you.   Applying the monitor   Shave hair from upper left chest.   Hold abrader disc by orange tab.  Rub abrader in 40 strokes over left upper chest as indicated in your monitor instructions.   Clean area with 4 enclosed alcohol pads .  Use all pads to assure are is cleaned thoroughly.  Let dry.   Apply patch as indicated in monitor instructions.  Patch will be place under collarbone on left side of chest with arrow pointing upward.   Rub patch  adhesive wings for 2 minutes.Remove white label marked "1".  Remove white label marked "2".  Rub patch adhesive wings for 2 additional minutes.   While looking in a mirror, press and release button in center of patch.  A small green light will flash 3-4 times .  This will be your only indicator the monitor has been turned on.     Do not shower for the first 24 hours.  You may shower after the first 24 hours.   Press button if you feel a symptom. You will hear a small click.  Record Date, Time and Symptom in the Patient Log Book.   When you are ready to remove patch, follow instructions on last 2 pages of Patient Log Book.  Stick patch monitor onto last page of Patient Log Book.   Place Patient Log Book in [place] box.  Use locking tab on box and tape box closed securely.  The [employer] has [employer] on it.  Please place in mailbox as soon as possible.  Your physician should have your test results approximately 7 days after the monitor has been mailed back to [facility].   Call [place] at [phone] if you have questions regarding your ZIO XT patch monitor.  Call them immediately if you see an orange light blinking on your monitor.   If your monitor falls off in less than 4 days contact our Monitor  department at [phone].  If your monitor becomes loose or falls off after 4 days call [employer] at [phone] for suggestions on securing your monitor.   Follow-Up: At [facility], you and your health needs are our priority.  As part of our continuing mission to provide you with exceptional heart care, we have created designated Provider Care Teams.  These Care Teams include your primary Cardiologist (physician) and Advanced Practice Providers (APPs -  Physician Assistants and Nurse Practitioners) who all work together to provide you with the care you need, when you need it.  Your next appointment:   6 month(s)  The format for your next appointment:    In Person  Provider:   Quay Burow, MD

## 2021-10-24 NOTE — Assessment & Plan Note (Signed)
History of hyperlipidemia on statin therapy with lipid profile performed 05/15/2021 revealing total cholesterol 73, LDL 29 and HDL of 25.

## 2021-10-26 DIAGNOSIS — I480 Paroxysmal atrial fibrillation: Secondary | ICD-10-CM

## 2021-10-28 ENCOUNTER — Other Ambulatory Visit: Payer: Self-pay | Admitting: Nurse Practitioner

## 2021-11-09 DIAGNOSIS — I480 Paroxysmal atrial fibrillation: Secondary | ICD-10-CM | POA: Diagnosis not present

## 2021-11-18 ENCOUNTER — Encounter: Payer: Self-pay | Admitting: Cardiovascular Disease

## 2021-11-20 ENCOUNTER — Other Ambulatory Visit: Payer: Self-pay

## 2021-11-20 MED ORDER — METOPROLOL TARTRATE 25 MG PO TABS: ORAL_TABLET | ORAL | 3 refills | Status: DC

## 2022-02-20 ENCOUNTER — Other Ambulatory Visit: Payer: Medicare Other

## 2022-03-01 ENCOUNTER — Other Ambulatory Visit: Payer: Medicare Other

## 2022-03-06 DIAGNOSIS — G4733 Obstructive sleep apnea (adult) (pediatric): Secondary | ICD-10-CM | POA: Diagnosis not present

## 2022-03-08 DIAGNOSIS — E1165 Type 2 diabetes mellitus with hyperglycemia: Secondary | ICD-10-CM | POA: Diagnosis not present

## 2022-03-08 DIAGNOSIS — I10 Essential (primary) hypertension: Secondary | ICD-10-CM | POA: Diagnosis not present

## 2022-03-08 DIAGNOSIS — E7800 Pure hypercholesterolemia, unspecified: Secondary | ICD-10-CM | POA: Diagnosis not present

## 2022-03-08 DIAGNOSIS — E1140 Type 2 diabetes mellitus with diabetic neuropathy, unspecified: Secondary | ICD-10-CM | POA: Diagnosis not present

## 2022-03-22 ENCOUNTER — Other Ambulatory Visit: Payer: Medicare Other

## 2022-03-22 DIAGNOSIS — N403 Nodular prostate with lower urinary tract symptoms: Secondary | ICD-10-CM | POA: Diagnosis not present

## 2022-03-22 DIAGNOSIS — N5201 Erectile dysfunction due to arterial insufficiency: Secondary | ICD-10-CM | POA: Diagnosis not present

## 2022-03-22 DIAGNOSIS — R350 Frequency of micturition: Secondary | ICD-10-CM | POA: Diagnosis not present

## 2022-04-06 ENCOUNTER — Ambulatory Visit
Admission: RE | Admit: 2022-04-06 | Discharge: 2022-04-06 | Disposition: A | Discharge status: Home / Self Care | Payer: Medicare Other | Source: Ambulatory Visit | Attending: Acute Care | Admitting: Acute Care

## 2022-04-06 DIAGNOSIS — J439 Emphysema, unspecified: Secondary | ICD-10-CM | POA: Diagnosis not present

## 2022-04-06 DIAGNOSIS — R911 Solitary pulmonary nodule: Secondary | ICD-10-CM

## 2022-04-06 DIAGNOSIS — I700 Atherosclerosis of aorta: Secondary | ICD-10-CM | POA: Diagnosis not present

## 2022-04-06 DIAGNOSIS — Z87891 Personal history of nicotine dependence: Secondary | ICD-10-CM

## 2022-04-09 ENCOUNTER — Other Ambulatory Visit: Payer: Self-pay | Admitting: Acute Care

## 2022-04-09 DIAGNOSIS — Z122 Encounter for screening for malignant neoplasm of respiratory organs: Secondary | ICD-10-CM

## 2022-04-09 DIAGNOSIS — Z87891 Personal history of nicotine dependence: Secondary | ICD-10-CM

## 2022-04-11 ENCOUNTER — Ambulatory Visit (HOSPITAL_COMMUNITY)
Admission: RE | Admit: 2022-04-11 | Discharge: 2022-04-11 | Disposition: A | Discharge status: Home / Self Care | Payer: Medicare Other | Source: Ambulatory Visit | Attending: Cardiovascular Disease | Admitting: Cardiovascular Disease

## 2022-04-11 DIAGNOSIS — I701 Atherosclerosis of renal artery: Secondary | ICD-10-CM | POA: Diagnosis not present

## 2022-04-24 ENCOUNTER — Encounter: Payer: Self-pay | Admitting: Cardiovascular Disease

## 2022-04-24 ENCOUNTER — Ambulatory Visit: Payer: Medicare Other | Attending: Cardiovascular Disease | Admitting: Cardiovascular Disease

## 2022-04-24 VITALS — BP 126/74 | HR 52 | Ht 71.0 in | Wt 217.2 lb

## 2022-04-24 DIAGNOSIS — I701 Atherosclerosis of renal artery: Secondary | ICD-10-CM

## 2022-04-24 DIAGNOSIS — E785 Hyperlipidemia, unspecified: Secondary | ICD-10-CM

## 2022-04-24 DIAGNOSIS — Z951 Presence of aortocoronary bypass graft: Secondary | ICD-10-CM

## 2022-04-24 DIAGNOSIS — I2510 Atherosclerotic heart disease of native coronary artery without angina pectoris: Secondary | ICD-10-CM | POA: Diagnosis not present

## 2022-04-24 DIAGNOSIS — E782 Mixed hyperlipidemia: Secondary | ICD-10-CM | POA: Diagnosis not present

## 2022-04-24 DIAGNOSIS — I480 Paroxysmal atrial fibrillation: Secondary | ICD-10-CM

## 2022-04-24 DIAGNOSIS — I10 Essential (primary) hypertension: Secondary | ICD-10-CM

## 2022-04-24 NOTE — Assessment & Plan Note (Signed)
History of essential hypertension with blood pressure measured today at 126/74.  He is on low-dose metoprolol.

## 2022-04-24 NOTE — Assessment & Plan Note (Signed)
History of PAF at the time of his bypass surgery.  He was discharged home on amiodarone and Eliquis both of which have been discontinued.  He is maintained sinus rhythm since.

## 2022-04-24 NOTE — Assessment & Plan Note (Signed)
History of CAD status post non-STEMI/10/2007.  He underwent PCI and stenting with proximal LAD with a Taxus drug-eluting stent.  He had a Myoview performed 04/04/2017 that showed ischemia in the LAD territory.  I performed radial diagnostic cath on him 04/11/2017 revealing "99% in-stent restenosis within the proximal LAD stent".  I restented him with a 3 mm x 20 mm long Synergy drug-eluting stent.  Because of recurrent symptoms similar to prior coronary symptoms I read catheterized him 05/11/2021 revealing a patent LAD stent with 70% ostial left main and 75% proximal RCA stenosis.  He underwent CABG x 3 by Dr. Kipp Brood 05/17/2021 with a LIMA to his LAD, vein to an obtuse marginal branch and PDA.  He did well postoperatively.  He is active and denies chest pain or shortness of breath.

## 2022-04-24 NOTE — Progress Notes (Signed)
04/24/2022 Justin Carr   09/29/1945  [id number]  Primary Physician Shirline Frees, MD Primary Cardiologist: [name] MD FACP, [place], [place], [place]  HPI:  Justin Carr is a 77 y.o.   mildly overweight married Caucasian male, father of 2 children (1 living), who I  I last saw in the office 10/24/2021. 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 His problems include obstructive sleep apnea, on CPAP, hypertension, and hyperlipidemia. He denies chest pain or shortness of breath. He had a non-ST-segment-elevation myocardial infarction, July 02, 2007. He underwent PCI and stenting of his proximal LAD with a Taxus Liberte drug-eluting stent. He had also had a 60% right renal artery stenosis documented angiographically at that time, which we have been following by duplex ultrasound. Since I saw him back a year ago he denies chest pain or shortness of breath but has noticed some left upper extremity discomfort with exertion similar to his pre-MI symptoms. Marland Kitchen    He  underwent Myoview stress testing 04/04/17 that showed ischemia in the LAD territory. I performed radial diagnostic cath on him 04/11/17 revealed a 99% "in-stent restenosis" within the proximal LAD stent. Remainder of his coronary anatomy was free of significant disease in his LV function was normal. I restarted him with a 3 mm x 20 mm long synergy drug-eluting stent postdilated to 3.23 mm resulting reduction a 9% stenosis to 0% residual. He has done well since.   He underwent blepharoplasty and after the several weeks post procedure has noticed some discomfort in his left bicep and has occurred both at rest and with exertion.  These symptoms are similar to his preintervention symptoms 4 years ago.  Based on this I decided to proceed with outpatient diagnostic coronary angiography 05/11/2021 revealing a patent proximal LAD stent with 70%  ostial left main and 75% proximal RCA stenosis.  He ultimately underwent CABG x3 by Dr. Kipp Brood 05/17/2021 with a LIMA to his LAD, vein to an obtuse marginal branch and PDA.  He did well postoperatively.  He is very active and currently is asymptomatic.  Did have perioperative PAF and was discharged home on amiodarone and Eliquis.  He had no recurrence.  Since I saw him in the office 5 months ago and has remained stable.  He does play golf 3-4 times a week in good weather and walks 2-1/2 to 3 miles a day otherwise.  He is completely asymptomatic.   Current Meds  Medication Sig   Alpha-Lipoic Acid 300 MG CAPS Take 300 mg by mouth in the morning and at bedtime.   aspirin 81 MG tablet Take 1 tablet (81 mg total) by mouth daily. (Patient taking differently: Take 81 mg by mouth at bedtime.)   Cholecalciferol (VITAMIN D) 50 MCG (2000 UT) tablet Take 2,000 Units by mouth daily.   Coenzyme Q10 (COQ-10 PO) Take 1 capsule by mouth at bedtime.    dapagliflozin propanediol (FARXIGA) 5 MG TABS tablet Take 5 mg by mouth daily.   famotidine (PEPCID) 20 MG tablet Take 1 tablet (20 mg total) by mouth 2 (two) times daily.   metFORMIN (GLUCOPHAGE) 500 MG tablet 1 tablet with a meal Orally Once a day for 90 days   metoprolol tartrate (LOPRESSOR) 25 MG tablet Take 1/2 tablet (12.'5mg'$ ) twice daily.   Multiple Vitamin (MULTIVITAMIN WITH MINERALS) TABS tablet Take 1 tablet by mouth daily.   Multiple Vitamins-Minerals (  PRESERVISION AREDS PO) Take 1 capsule by mouth 2 (two) times daily.   nitroGLYCERIN (NITROSTAT) 0.4 MG SL tablet DISSOLVE ONE TABLET UNDER THE TONGUE EVERY 5 MINUTES AS NEEDED FOR CHEST PAIN.  DO NOT EXCEED A TOTAL OF 3 DOSES IN 15 MINUTES   pregabalin (LYRICA) 100 MG capsule Take 100 mg by mouth 2 (two) times daily.   ramipril (ALTACE) 10 MG capsule Take 1 capsule by mouth once daily   rosuvastatin (CRESTOR) 20 MG tablet TAKE 1 TABLET BY MOUTH THREE TIMES A WEEK   sildenafil (VIAGRA) 100 MG tablet Take  100 mg by mouth daily as needed for erectile dysfunction.   TRULICITY 9.83 JA/2.5KN SOPN Inject 0.75 mg into the skin every Monday.     Allergies  Allergen Reactions   Peanuts [Peanut Oil] Anaphylaxis   Lipitor [Atorvastatin] Other (See Comments)    Muscle cramps   Rosuvastatin Other (See Comments)    Muscle cramps if taken daily-pt still takes drug, but takes 3 times a week.    Social History   Socioeconomic History   Marital status: Married    Spouse name: Not on file   Number of children: 2   Years of education: College +   Highest education level: Not on file  Occupational History   Occupation: Retired  Tobacco Use   Smoking status: Former    Packs/day: 1.00    Years: 34.00    Total pack years: 34.00    Types: Cigarettes    Quit date: 07/03/2007    Years since quitting: 14.8   Smokeless tobacco: Never  Vaping Use   Vaping Use: Never used  Substance and Sexual Activity   Alcohol use: Yes    Comment: 04/11/2017 "might have a beer q other week"   Drug use: No   Sexual activity: Not Currently  Other Topics Concern   Not on file  Social History Narrative   1 cup coffee and 2 sodas per day.   Lives at home with his wife.   Right-handed.   Social Determinants of Health   Financial Resource Strain: Low Risk  (05/28/2017)   Overall Financial Resource Strain (CARDIA)    Difficulty of Paying Living Expenses: Not hard at all  Food Insecurity: No Food Insecurity (05/28/2017)   Hunger Vital Sign    Worried About Running Out of Food in the Last Year: Never true    Ran Out of Food in the Last Year: Never true  Transportation Needs: No Transportation Needs (05/28/2017)   PRAPARE - Hydrologist (Medical): No    Lack of Transportation (Non-Medical): No  Physical Activity: Insufficiently Active (05/28/2017)   Exercise Vital Sign    Days of Exercise per Week: 3 days    Minutes of Exercise per Session: 30 min  Stress: No Stress Concern Present (05/28/2017)    [place]    Feeling of Stress : Not at all  Social Connections: Not on file  Intimate Partner Violence: Not on file     Review of Systems: General: negative for chills, fever, night sweats or weight changes.  Cardiovascular: negative for chest pain, dyspnea on exertion, edema, orthopnea, palpitations, paroxysmal nocturnal dyspnea or shortness of breath Dermatological: negative for rash Respiratory: negative for cough or wheezing Urologic: negative for hematuria Abdominal: negative for nausea, vomiting, diarrhea, bright red blood per rectum, melena, or hematemesis Neurologic: negative for visual changes, syncope, or dizziness All other systems reviewed and  are otherwise negative except as noted above.    Blood pressure 126/74, pulse (!) 52, height '5\' 11"'$  (1.803 m), weight 217 lb 3.2 oz (98.5 kg), SpO2 97 %.  General appearance: alert and no distress Neck: no adenopathy, no carotid bruit, no JVD, supple, symmetrical, trachea midline, and thyroid not enlarged, symmetric, no tenderness/mass/nodules Lungs: clear to auscultation bilaterally Heart: regular rate and rhythm, S1, S2 normal, no murmur, click, rub or gallop Extremities: extremities normal, atraumatic, no cyanosis or edema Pulses: 2+ and symmetric Skin: Skin color, texture, turgor normal. No rashes or lesions Neurologic: Grossly normal  EKG sinus bradycardia at 52 without ST or T wave changes.  I personally reviewed this EKG.  ASSESSMENT AND PLAN:   Coronary artery disease History of CAD status post non-STEMI/10/2007.  He underwent PCI and stenting with proximal LAD with a Taxus drug-eluting stent.  He had a Myoview performed 04/04/2017 that showed ischemia in the LAD territory.  I performed radial diagnostic cath on him 04/11/2017 revealing "99% in-stent restenosis within the proximal LAD stent".  I restented him with a 3 mm x 20 mm long Synergy drug-eluting  stent.  Because of recurrent symptoms similar to prior coronary symptoms I read catheterized him 05/11/2021 revealing a patent LAD stent with 70% ostial left main and 75% proximal RCA stenosis.  He underwent CABG x 3 by Dr. Kipp Brood 05/17/2021 with a LIMA to his LAD, vein to an obtuse marginal branch and PDA.  He did well postoperatively.  He is active and denies chest pain or shortness of breath.  Essential hypertension History of essential hypertension with blood pressure measured today at 126/74.  He is on low-dose metoprolol.  Hyperlipidemia History of hyperlipidemia on statin therapy with lipid profile performed 04/06/2022 revealing total cholesterol 147, LDL 80 HDL 42.  Renal artery stenosis (HCC) History of renal artery stenosis demonstrated at the time of 07/02/2007.  He had recent renal Doppler studies performed 04/11/2022 that showed no evidence of renal artery stenosis.  Blood pressures under good control on minimal medications.  Paroxysmal atrial fibrillation (HCC) History of PAF at the time of his bypass surgery.  He was discharged home on amiodarone and Eliquis both of which have been discontinued.  He is maintained sinus rhythm since.     [name] MD FACP,FACC,FAHA, [facility] 04/24/2022 10:58 AM

## 2022-04-24 NOTE — Patient Instructions (Signed)

## 2022-04-24 NOTE — Assessment & Plan Note (Signed)
History of renal artery stenosis demonstrated at the time of 07/02/2007.  He had recent renal Doppler studies performed 04/11/2022 that showed no evidence of renal artery stenosis.  Blood pressures under good control on minimal medications.

## 2022-04-24 NOTE — Assessment & Plan Note (Signed)
History of hyperlipidemia on statin therapy with lipid profile performed 04/06/2022 revealing total cholesterol 147, LDL 80 HDL 42.

## 2022-05-10 ENCOUNTER — Telehealth: Payer: Self-pay | Admitting: Cardiovascular Disease

## 2022-05-10 DIAGNOSIS — M316 Other giant cell arteritis: Secondary | ICD-10-CM

## 2022-05-10 NOTE — Telephone Encounter (Signed)
Patient stated he has been seen by dentist and endodontist. He was sent to our clinic for possible temporal arteritis.

## 2022-05-10 NOTE — Telephone Encounter (Signed)
Patient called stating his dentist told him he might have temporal arteritis and should follow up with his cardiologist.  I offered him appt on 3/5 with APP, patient refused stating he feels she should be seen sooner than that.

## 2022-05-10 NOTE — Telephone Encounter (Signed)
Discussed with Coletta Memos, FNP-C he states that this is treated by rheumatology. Let refer him there.  [phone]-[facility]. Pt notified we are cancelling his appt, verbalized understanding. Urgent Referral entered. Gave pt phone number to call if needed.

## 2022-05-11 ENCOUNTER — Ambulatory Visit: Payer: Medicare Other | Admitting: General Practice

## 2022-05-11 DIAGNOSIS — R519 Headache, unspecified: Secondary | ICD-10-CM | POA: Diagnosis not present

## 2022-05-11 DIAGNOSIS — H353132 Nonexudative age-related macular degeneration, bilateral, intermediate dry stage: Secondary | ICD-10-CM | POA: Diagnosis not present

## 2022-06-27 ENCOUNTER — Other Ambulatory Visit: Payer: Self-pay | Admitting: Nurse Practitioner

## 2022-06-27 ENCOUNTER — Other Ambulatory Visit: Payer: Self-pay | Admitting: Cardiovascular Disease

## 2022-07-22 ENCOUNTER — Other Ambulatory Visit: Payer: Self-pay | Admitting: Cardiovascular Disease

## 2022-07-29 ENCOUNTER — Other Ambulatory Visit: Payer: Self-pay | Admitting: Cardiovascular Disease

## 2022-08-09 DIAGNOSIS — L821 Other seborrheic keratosis: Secondary | ICD-10-CM | POA: Diagnosis not present

## 2022-08-09 DIAGNOSIS — L814 Other melanin hyperpigmentation: Secondary | ICD-10-CM | POA: Diagnosis not present

## 2022-08-09 DIAGNOSIS — L570 Actinic keratosis: Secondary | ICD-10-CM | POA: Diagnosis not present

## 2022-08-15 ENCOUNTER — Encounter: Payer: Self-pay | Admitting: Interventional Cardiology

## 2022-08-15 NOTE — Telephone Encounter (Signed)
error 

## 2022-09-11 DIAGNOSIS — E7800 Pure hypercholesterolemia, unspecified: Secondary | ICD-10-CM | POA: Diagnosis not present

## 2022-09-11 DIAGNOSIS — I10 Essential (primary) hypertension: Secondary | ICD-10-CM | POA: Diagnosis not present

## 2022-09-11 DIAGNOSIS — E1140 Type 2 diabetes mellitus with diabetic neuropathy, unspecified: Secondary | ICD-10-CM | POA: Diagnosis not present

## 2022-09-11 DIAGNOSIS — E1165 Type 2 diabetes mellitus with hyperglycemia: Secondary | ICD-10-CM | POA: Diagnosis not present

## 2022-09-28 ENCOUNTER — Other Ambulatory Visit: Payer: Self-pay | Admitting: Cardiovascular Disease

## 2022-11-08 DIAGNOSIS — E119 Type 2 diabetes mellitus without complications: Secondary | ICD-10-CM | POA: Diagnosis not present

## 2022-11-08 DIAGNOSIS — H353132 Nonexudative age-related macular degeneration, bilateral, intermediate dry stage: Secondary | ICD-10-CM | POA: Diagnosis not present

## 2022-11-08 DIAGNOSIS — H524 Presbyopia: Secondary | ICD-10-CM | POA: Diagnosis not present

## 2022-11-08 DIAGNOSIS — Z961 Presence of intraocular lens: Secondary | ICD-10-CM | POA: Diagnosis not present

## 2022-12-22 ENCOUNTER — Other Ambulatory Visit: Payer: Self-pay | Admitting: Cardiovascular Disease

## 2023-03-07 ENCOUNTER — Encounter: Payer: Self-pay | Admitting: Acute Care

## 2023-03-14 DIAGNOSIS — I10 Essential (primary) hypertension: Secondary | ICD-10-CM | POA: Diagnosis not present

## 2023-03-14 DIAGNOSIS — E1140 Type 2 diabetes mellitus with diabetic neuropathy, unspecified: Secondary | ICD-10-CM | POA: Diagnosis not present

## 2023-03-14 DIAGNOSIS — E1165 Type 2 diabetes mellitus with hyperglycemia: Secondary | ICD-10-CM | POA: Diagnosis not present

## 2023-03-14 DIAGNOSIS — E7800 Pure hypercholesterolemia, unspecified: Secondary | ICD-10-CM | POA: Diagnosis not present

## 2023-03-17 ENCOUNTER — Other Ambulatory Visit: Payer: Self-pay | Admitting: Cardiovascular Disease

## 2023-03-28 ENCOUNTER — Other Ambulatory Visit: Payer: Self-pay

## 2023-03-28 DIAGNOSIS — Z122 Encounter for screening for malignant neoplasm of respiratory organs: Secondary | ICD-10-CM

## 2023-03-28 DIAGNOSIS — Z87891 Personal history of nicotine dependence: Secondary | ICD-10-CM

## 2023-04-09 ENCOUNTER — Other Ambulatory Visit: Payer: Medicare Other

## 2023-04-11 ENCOUNTER — Ambulatory Visit
Admission: RE | Admit: 2023-04-11 | Discharge: 2023-04-11 | Disposition: A | Discharge status: Home / Self Care | Payer: Medicare Other | Source: Ambulatory Visit | Attending: Family Medicine | Admitting: Family Medicine

## 2023-04-11 DIAGNOSIS — Z87891 Personal history of nicotine dependence: Secondary | ICD-10-CM | POA: Diagnosis not present

## 2023-04-11 DIAGNOSIS — Z122 Encounter for screening for malignant neoplasm of respiratory organs: Secondary | ICD-10-CM

## 2023-04-22 ENCOUNTER — Other Ambulatory Visit: Payer: Self-pay | Admitting: Cardiovascular Disease

## 2023-04-22 ENCOUNTER — Other Ambulatory Visit: Payer: Self-pay

## 2023-04-22 DIAGNOSIS — Z0000 Encounter for general adult medical examination without abnormal findings: Secondary | ICD-10-CM | POA: Diagnosis not present

## 2023-04-22 DIAGNOSIS — E782 Mixed hyperlipidemia: Secondary | ICD-10-CM | POA: Diagnosis not present

## 2023-04-22 DIAGNOSIS — I2510 Atherosclerotic heart disease of native coronary artery without angina pectoris: Secondary | ICD-10-CM | POA: Diagnosis not present

## 2023-04-22 DIAGNOSIS — E1140 Type 2 diabetes mellitus with diabetic neuropathy, unspecified: Secondary | ICD-10-CM | POA: Diagnosis not present

## 2023-04-22 DIAGNOSIS — I10 Essential (primary) hypertension: Secondary | ICD-10-CM | POA: Diagnosis not present

## 2023-04-22 DIAGNOSIS — G4733 Obstructive sleep apnea (adult) (pediatric): Secondary | ICD-10-CM | POA: Diagnosis not present

## 2023-04-26 ENCOUNTER — Ambulatory Visit: Payer: Medicare Other | Attending: Cardiovascular Disease | Admitting: Cardiovascular Disease

## 2023-04-26 ENCOUNTER — Encounter: Payer: Self-pay | Admitting: Cardiovascular Disease

## 2023-04-26 VITALS — BP 114/54 | HR 56 | Ht 70.0 in | Wt 214.0 lb

## 2023-04-26 DIAGNOSIS — I10 Essential (primary) hypertension: Secondary | ICD-10-CM

## 2023-04-26 DIAGNOSIS — I2510 Atherosclerotic heart disease of native coronary artery without angina pectoris: Secondary | ICD-10-CM | POA: Diagnosis not present

## 2023-04-26 DIAGNOSIS — I701 Atherosclerosis of renal artery: Secondary | ICD-10-CM | POA: Diagnosis not present

## 2023-04-26 DIAGNOSIS — E782 Mixed hyperlipidemia: Secondary | ICD-10-CM

## 2023-04-26 NOTE — Patient Instructions (Signed)
    Follow-Up: At [facility], you and your health needs are our priority.  As part of our continuing mission to provide you with exceptional heart care, we have created designated Provider Care Teams.  These Care Teams include your primary Cardiologist (physician) and Advanced Practice Providers (APPs -  Physician Assistants and Nurse Practitioners) who all work together to provide you with the care you need, when you need it.  Your next appointment:   12 month(s)  Provider:   Nanetta Batty, MD

## 2023-04-26 NOTE — Assessment & Plan Note (Signed)
History of CAD status post non-STEMI/10/2007 which time I stented his proximal LAD with a Taxus libertine drug-eluting stent.  I recath him 04/04/2017 after an abnormal Myoview stress test revealing 99% "in-stent restenosis of his proximal LAD which I restented with a 3 mm x 20 mm long Synergy drug-eluting stent.  Because of recurrent chest pain I catheterized him 05/11/2021 revealing a 70% ostial left main stenosis and a 75% proximal RCA stenosis.  He ultimately underwent CABG by Dr. Cliffton Asters 05/17/2021 with a LIMA to his LAD, vein to obtuse marginal branch and PDA.  He is very active and asymptomatic.

## 2023-04-26 NOTE — Assessment & Plan Note (Signed)
History of essential hypertension her blood pressure measured today at 114/54.  He is on metoprolol and ramipril.

## 2023-04-26 NOTE — Assessment & Plan Note (Signed)
History of hyperlipidemia on statin therapy with lipid profile performed 04/22/2023 revealing total cholesterol 125, LDL of 59 HDL 45.

## 2023-04-26 NOTE — Progress Notes (Signed)
04/26/2023 Justin Carr   March 17, 1946  [id number]  Primary Physician Noberto Retort, MD Primary Cardiologist: Runell Gess MD FACP, [place], [place], [place]  HPI:  Justin Carr is a 79 y.o.  mildly overweight married Caucasian male, father of 2 children (1 living), who I  I last saw in the office 04/24/2022. 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 His problems include obstructive sleep apnea, on CPAP, hypertension, and hyperlipidemia. He denies chest pain or shortness of breath. He had a non-ST-segment-elevation myocardial infarction, July 02, 2007. He underwent PCI and stenting of his proximal LAD with a Taxus Liberte drug-eluting stent. He had also had a 60% right renal artery stenosis documented angiographically at that time, which we have been following by duplex ultrasound. Since I saw him back a year ago he denies chest pain or shortness of breath but has noticed some left upper extremity discomfort with exertion similar to his pre-MI symptoms. Marland Kitchen    He  underwent Myoview stress testing 04/04/17 that showed ischemia in the LAD territory. I performed radial diagnostic cath on him 04/11/17 revealed a 99% "in-stent restenosis" within the proximal LAD stent. Remainder of his coronary anatomy was free of significant disease in his LV function was normal. I restarted him with a 3 mm x 20 mm long synergy drug-eluting stent postdilated to 3.23 mm resulting reduction a 9% stenosis to 0% residual. He has done well since.   He underwent blepharoplasty and after the several weeks post procedure has noticed some discomfort in his left bicep and has occurred both at rest and with exertion.  These symptoms are similar to his preintervention symptoms 4 years ago.  Based on this I decided to proceed with outpatient diagnostic coronary angiography 05/11/2021 revealing a patent proximal LAD stent with 70%  ostial left main and 75% proximal RCA stenosis.  He ultimately underwent CABG x3 by Dr. Cliffton Asters 05/17/2021 with a LIMA to his LAD, vein to an obtuse marginal branch and PDA.  He did well postoperatively.  He is very active and currently is asymptomatic.  Did have perioperative PAF and was discharged home on amiodarone and Eliquis.  He had no recurrence.   Since I saw him in the office 1 year ago and has remained stable.  He does play golf 3-4 times a week in good weather and walks 2-1/2 to 3 miles a day otherwise.  He is completely asymptomatic.   Current Meds  Medication Sig   Alpha-Lipoic Acid 300 MG CAPS Take 300 mg by mouth in the morning and at bedtime.   aspirin 81 MG tablet Take 1 tablet (81 mg total) by mouth daily. (Patient taking differently: Take 81 mg by mouth at bedtime.)   Coenzyme Q10 (COQ-10 PO) Take 1 capsule by mouth at bedtime.    dapagliflozin propanediol (FARXIGA) 5 MG TABS tablet Take 5 mg by mouth daily.   metoprolol tartrate (LOPRESSOR) 25 MG tablet Take 1/2 (one-half) tablet by mouth twice daily   Multiple Vitamin (MULTIVITAMIN WITH MINERALS) TABS tablet Take 1 tablet by mouth daily.   Multiple Vitamins-Minerals (PRESERVISION AREDS PO) Take 1 capsule by mouth 2 (two) times daily.   nitroGLYCERIN (NITROSTAT) 0.4 MG SL tablet DISSOLVE ONE TABLET UNDER THE TONGUE EVERY 5 MINUTES AS NEEDED FOR CHEST PAIN.  DO NOT EXCEED A TOTAL OF 3 DOSES IN 15 MINUTES   pregabalin (LYRICA) 100  MG capsule Take 100 mg by mouth 2 (two) times daily.   ramipril (ALTACE) 10 MG capsule Take 1 capsule by mouth once daily   rosuvastatin (CRESTOR) 20 MG tablet TAKE 1 TABLET BY MOUTH THREE TIMES A WEEK   sildenafil (VIAGRA) 100 MG tablet Take 100 mg by mouth daily as needed for erectile dysfunction.   TRULICITY 0.75 MG/0.5ML SOPN Inject 0.75 mg into the skin every Monday.     Allergies  Allergen Reactions   Peanuts [Peanut Oil] Anaphylaxis   Lipitor [Atorvastatin] Other (See Comments)    Muscle  cramps   Rosuvastatin Other (See Comments)    Muscle cramps if taken daily-pt still takes drug, but takes 3 times a week.    Social History   Socioeconomic History   Marital status: Married    Spouse name: Not on file   Number of children: 2   Years of education: College +   Highest education level: Not on file  Occupational History   Occupation: Retired  Tobacco Use   Smoking status: Former    Current packs/day: 0.00    Average packs/day: 1 pack/day for 34.0 years (34.0 ttl pk-yrs)    Types: Cigarettes    Start date: 07/02/1973    Quit date: 07/03/2007    Years since quitting: 15.8   Smokeless tobacco: Never  Vaping Use   Vaping status: Never Used  Substance and Sexual Activity   Alcohol use: Yes    Comment: 04/11/2017 "might have a beer q other week"   Drug use: No   Sexual activity: Not Currently  Other Topics Concern   Not on file  Social History Narrative   1 cup coffee and 2 sodas per day.   Lives at home with his wife.   Right-handed.   Social Drivers of Corporate investment banker Strain: Low Risk  (05/28/2017)   Overall Financial Resource Strain (CARDIA)    Difficulty of Paying Living Expenses: Not hard at all  Food Insecurity: No Food Insecurity (05/28/2017)   Hunger Vital Sign    Worried About Running Out of Food in the Last Year: Never true    Ran Out of Food in the Last Year: Never true  Transportation Needs: No Transportation Needs (05/28/2017)   PRAPARE - Administrator, Civil Service (Medical): No    Lack of Transportation (Non-Medical): No  Physical Activity: Insufficiently Active (05/28/2017)   Exercise Vital Sign    Days of Exercise per Week: 3 days    Minutes of Exercise per Session: 30 min  Stress: No Stress Concern Present (05/28/2017)   [employer] of Occupational Health - Occupational Stress Questionnaire    Feeling of Stress : Not at all  Social Connections: Not on file  Intimate Partner Violence: Not on file     Review of  Systems: General: negative for chills, fever, night sweats or weight changes.  Cardiovascular: negative for chest pain, dyspnea on exertion, edema, orthopnea, palpitations, paroxysmal nocturnal dyspnea or shortness of breath Dermatological: negative for rash Respiratory: negative for cough or wheezing Urologic: negative for hematuria Abdominal: negative for nausea, vomiting, diarrhea, bright red blood per rectum, melena, or hematemesis Neurologic: negative for visual changes, syncope, or dizziness All other systems reviewed and are otherwise negative except as noted above.    Blood pressure (!) 114/54, pulse (!) 56, height 5\' 10"  (1.778 m), weight 214 lb (97.1 kg), SpO2 95%.  General appearance: alert and no distress Neck: no adenopathy, no carotid bruit, no  JVD, supple, symmetrical, trachea midline, and thyroid not enlarged, symmetric, no tenderness/mass/nodules Lungs: clear to auscultation bilaterally Heart: regular rate and rhythm, S1, S2 normal, no murmur, click, rub or gallop Extremities: extremities normal, atraumatic, no cyanosis or edema Pulses: 2+ and symmetric Skin: Skin color, texture, turgor normal. No rashes or lesions Neurologic: Grossly normal  EKG EKG Interpretation Date/Time:  Friday April 26 2023 10:17:49 EST Ventricular Rate:  56 PR Interval:  248 QRS Duration:  90 QT Interval:  374 QTC Calculation: 360 R Axis:   10  Text Interpretation: Sinus bradycardia with 1st degree A-V block Nonspecific T wave abnormality When compared with ECG of 19-May-2021 13:49, Sinus rhythm has replaced Atrial fibrillation Vent. rate has decreased BY  78 BPM ST no longer elevated in Inferior leads Confirmed by Nanetta Batty [phone]) on 04/26/2023 10:36:26 AM    ASSESSMENT AND PLAN:   Coronary artery disease History of CAD status post non-STEMI/10/2007 which time I stented his proximal LAD with a Taxus libertine drug-eluting stent.  I recath him 04/04/2017 after an abnormal Myoview  stress test revealing 99% "in-stent restenosis of his proximal LAD which I restented with a 3 mm x 20 mm long Synergy drug-eluting stent.  Because of recurrent chest pain I catheterized him 05/11/2021 revealing a 70% ostial left main stenosis and a 75% proximal RCA stenosis.  He ultimately underwent CABG by Dr. Cliffton Asters 05/17/2021 with a LIMA to his LAD, vein to obtuse marginal branch and PDA.  He is very active and asymptomatic.  Essential hypertension History of essential hypertension her blood pressure measured today at 114/54.  He is on metoprolol and ramipril.  Hyperlipidemia History of hyperlipidemia on statin therapy with lipid profile performed 04/22/2023 revealing total cholesterol 125, LDL of 59 HDL 45.  Renal artery stenosis (HCC) History of 60% right renal artery stenosis at the time of cath in 2009 with serial surveillance renal Doppler studies over the years which showed no evidence of significant progression.     Runell Gess MD FACP,FACC,FAHA, [facility] 04/26/2023 10:45 AM

## 2023-04-26 NOTE — Assessment & Plan Note (Signed)
History of 60% right renal artery stenosis at the time of cath in 2009 with serial surveillance renal Doppler studies over the years which showed no evidence of significant progression.

## 2023-05-02 ENCOUNTER — Other Ambulatory Visit: Payer: Self-pay | Admitting: Medical Genetics

## 2023-05-06 ENCOUNTER — Other Ambulatory Visit: Payer: Self-pay

## 2023-05-06 MED ORDER — METOPROLOL TARTRATE 25 MG PO TABS: ORAL_TABLET | ORAL | 3 refills | Status: AC

## 2023-05-14 ENCOUNTER — Other Ambulatory Visit (HOSPITAL_COMMUNITY): Payer: Medicare Other | Attending: Medical Genetics

## 2023-05-15 ENCOUNTER — Ambulatory Visit: Payer: Federal, State, Local not specified - PPO | Admitting: Podiatry

## 2023-05-16 ENCOUNTER — Encounter: Payer: Self-pay | Admitting: Cardiovascular Disease

## 2023-05-16 NOTE — Telephone Encounter (Signed)
   Pt c/o of Chest Pain: STAT if active CP, including tightness, pressure, jaw pain, radiating pain to shoulder/upper arm/back, CP unrelieved by Nitro. Symptoms reported of SOB, nausea, vomiting, sweating.  1. Are you having CP right now? Yes, Started 2-3 days after he last visit with Dr. Allyson Sabal   2. Are you experiencing any other symptoms (ex. SOB, nausea, vomiting, sweating)? Pain in left arm and doesn't feel right  3. Is your CP continuous or coming and going? Coming and going    4. Have you taken Nitroglycerin? No   5. How long have you been experiencing CP? Started 2-3 days after he last visit with Dr. Allyson Sabal   6. If NO CP at time of call then end call with telling Pt to call back or call 911 if Chest pain returns prior to return call from triage team.

## 2023-05-16 NOTE — Telephone Encounter (Signed)
Patient identification verified by 2 forms. Marilynn Rail, RN    Received call from patient  Patient states:   -Prior to stent he had left arm pain   -saw provide on 1/31, no issues at that time   -has developed left arm numbness/pain   -sometimes will develop indigestion/fullness in middle of chest   -symptoms started 2 weeks ago   -symptoms come and go and occur daily  Patient denies:   -chest pain   -SOB/difficulty breathing   -jaw pain  Patient scheduled for OV 2/21 at 8:50am  Reviewed ED warning signs/precautions Patient verbalized understanding, no questions at this time

## 2023-05-16 NOTE — Progress Notes (Unsigned)
Cardiology Office Note    Date:  05/17/2023  ID:  Justin Carr, DOB 06/08/1945, MRN [id number] PCP:  Noberto Retort, MD  Cardiologist:  Nanetta Batty, MD  Electrophysiologist:  None   Chief Complaint: Left arm pain/numbness  History of Present Illness: .    Justin Carr is a 78 y.o. male with visit-pertinent history of OSA on CPAP, hypertension, hyperlipidemia CAD with NSTEMI in 06/2007, he underwent PCI and stenting of his proximal LAD at that time.  History of CABG x 3 with LIMA to LAD, vein to an obtuse marginal branch and PDA in 04/2021.  On July 02, 2007 patient had a NSTEMI, underwent PCI and stenting of his proximal LAD with Taxus Liberty drug-eluting stent.  He also had a 60% right renal artery stenosis documented angiographically at that time, followed by Dr. Allyson Sabal with renal duplex ultrasound.  In 03/2017 he underwent Myoview stress testing that showed ischemia in the LAD territory.  He underwent diagnostic cath that revealed a 99% in-stent restenosis within the proximal LAD stent.  Remainder of his coronary anatomy was free of significant disease and his LV function was normal.  He was restented with a 3 mm x 20 mm long Synergy drug-eluting stent.  On 05/11/2021 patient underwent diagnostic coronary angiography as he was having discomfort in his left bicep that occurred at rest and with exertion, similar to his prior anginal symptoms.  Angiography revealed a patent proximal LAD stent with 70% ostial left main and 75% proximal RCA stenosis.  He underwent CABG x 3 with Dr. Cliffton Asters on 05/17/2021 with LIMA to his LAD, vein to an obtuse marginal branch and PDA.  He did have perioperative paroxysmal atrial fibrillation and was discharged home on Eliquis and amiodarone.  He has had no documented recurrence.  Patient was last seen in clinic on 04/26/2023 by Dr. Allyson Sabal, he remained stable from a cardiac standpoint.  He was regularly playing golf 3-4 times a week and was walking 2 and half to 3  miles a day.  Today he presents regarding a vague numbness in his left arm as well as a slight occasional discomfort at his bicep more towards his elbow. Patient notes that he was sitting at church when he felt a "numbness" spread down his left arm to his hand, then radiate into his face, lasting 30 minutes. He was concerned for possible TIA but he notes he had no slurring of words, asymmetric facial expression, or weakness. He reports since he has had occasional numbness lasting for a few minutes. He notes that his prior anginal equivalent was left bicep pain that started at his shoulder and radiated to his elbow, his current fleeting arm pain has been centered at his elbow. He notes he has not been exercising recently, he does walk his dog and has left elbow pain, he is unable to tell if this is related to walking or his dog pulling on the leash as he hold the leash with his left hand. He also endorses two days of intermittent epigastric discomfort, felt as though it was indigestion as he noted this improved with belching.   Labwork independently reviewed: 03/07/2023: Creatinine 1.08 04/22/2023: TSH 1.83, hemoglobin 16.7 ROS: .   Today he denies chest pain, shortness of breath, lower extremity edema, fatigue, palpitations, melena, hematuria, hemoptysis, diaphoresis, weakness, presyncope, syncope, orthopnea, and PND.  All other systems are reviewed and otherwise negative. Studies Reviewed: Marland Kitchen    EKG:  EKG is ordered today, personally reviewed,  demonstrating  EKG Interpretation Date/Time:  Friday May 17 2023 09:14:44 EST Ventricular Rate:  62 PR Interval:  206 QRS Duration:  86 QT Interval:  372 QTC Calculation: 377 R Axis:   109  Text Interpretation: Normal sinus rhythm Rightward axis Possible Inferior infarct , age undetermined When compared with ECG of 26-Apr-2023 10:17, PR interval has decreased QRS axis Shifted right Borderline criteria for Inferior infarct are now Present T wave  inversion now evident in Inferior leads Nonspecific T wave abnormality, improved in Lateral leads Confirmed by Reather Littler [phone]) on 05/17/2023 10:10:22 AM   CV Studies:  Cardiac Studies & Procedures   ______________________________________________________________________________________________ CARDIAC CATHETERIZATION  CARDIAC CATHETERIZATION 05/11/2021  Narrative Images from the original result were not included.    Prox RCA lesion is 75% stenosed.   Ost LM to Mid LM lesion is 70% stenosed.   Mid LAD lesion is 30% stenosed.   Previously placed Prox LAD to Mid LAD stent (unknown type) is  widely patent.   The left ventricular systolic function is normal.   LV end diastolic pressure is normal.   The left ventricular ejection fraction is 50-55% by visual estimate.  Justin Carr is a 78 y.o. male   [id number] LOCATION:  FACILITY: [facility] PHYSICIAN: Nanetta Carr, M.D. 08/05/1945   DATE OF PROCEDURE:  05/11/2021  DATE OF DISCHARGE:     CARDIAC CATHETERIZATION / IVUS LM    History obtained from chart review.Justin Carr is a 78 y.o.  mildly overweight married Caucasian male, father of 2 children (1 living), who I  I last saw in the office 03/29/2021. 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 His problems include obstructive sleep apnea, on CPAP, hypertension, and hyperlipidemia. He denies chest pain or shortness of breath. He had a non-ST-segment-elevation myocardial infarction, July 02, 2007. He underwent PCI and stenting of his proximal LAD with a Taxus Liberte drug-eluting stent. He had also had a 60% right renal artery stenosis documented angiographically at that time, which we have been following by duplex ultrasound. Since I saw him back a year ago he denies chest pain or shortness of breath but has noticed some left upper extremity discomfort with exertion similar to his pre-MI  symptoms. Marland Kitchen  He  underwent Myoview stress testing 04/04/17 that showed ischemia in the LAD territory. I performed radial diagnostic cath on him 04/11/17 revealed a 99% "in-stent restenosis" within the proximal LAD stent. Remainder of his coronary anatomy was free of significant disease in his LV function was normal. I restarted him with a 3 mm x 20 mm long synergy drug-eluting stent postdilated to 3.23 mm resulting reduction a 9% stenosis to 0% residual. He has done well since.  He underwent blepharoplasty last month successfully.  Over the last several weeks has noticed some discomfort in his left bicep and has occurred both at rest and with exertion.  These symptoms are similar to his preintervention symptoms 4 years ago.  He presents today for outpatient diagnostic coronary angiography to define his anatomy   PROCEDURE DESCRIPTION:  The patient was brought to the second floor [facility] cath lab in the postabsorptive state. He was premedicated with IV Versed and fentanyl. His right wrist was prepped and shaved in usual sterile fashion. Xylocaine 1% was used for local anesthesia. A 6 French sheath was inserted into the right radial artery using standard Seldinger technique. The patient  received 10,000 units  of heparin intravenously.  A 5 [place] TIG catheter and right Judkins catheter used for selective coronary angiography and left ventriculography respectively.  Isovue dye is used for the entirety of the case (80 cc of contrast total to patient).  Retrograde aortic, ventricular and pullback pressures were recorded.  Radial cocktail was administered via the SideArm sheath.  The left main appeared significant although the decision to recommend bypass surgery or PCI of the RCA hinged on whether it was truly anatomically significant and therefore I elected to perform IVUS interrogation.  The ACT was measured at 468.  Using a 6 [place] XB LAD 3.0 cm guide catheter along with a 0.14 [place] guidewire  and a intravascular ultrasound catheter I crossed the left main into the LAD and did automatic pullback.  The cross-sectional area of the ostium of the left main was 4.18 mm suggesting that it was significant.  Impression Mr. Soward has patent proximal overlapping LAD stents with the development of a significant distal left main lesion since I last Him in 2019.  He also has what appears to be a significant proximal dominant RCA stenosis with normal LV function.  His anatomy is suitable for CABG.  Given the fairly mild nature of his symptoms I feel comfortable discharging him home today.  We will discontinue his clopidogrel.  He will need a clopidogrel washout.  We will arrange for him to see cardiothoracic surgery early next week in anticipation of bypass surgery later in the week.  The radial sheath was removed and a TR band was placed on the right wrist to achieve patent hemostasis.  The patient left lab in stable condition.  Nanetta Carr. MD, [facility] 05/11/2021 1:51 PM  Findings Coronary Findings Diagnostic  Dominance: Right  Left Main Ost LM to Mid LM lesion is 70% stenosed.  Left Anterior Descending Previously placed Prox LAD to Mid LAD stent (unknown type) is  widely patent. Mid LAD lesion is 30% stenosed.  Right Coronary Artery Prox RCA lesion is 75% stenosed.  Intervention  No interventions have been documented.   CARDIAC CATHETERIZATION  CARDIAC CATHETERIZATION 04/11/2017  Narrative Images from the original result were not included.   Prox LAD lesion is 99% stenosed.  A stent was successfully placed.  Post intervention, there is a 0% residual stenosis.  The left ventricular systolic function is normal.  LV end diastolic pressure is normal.  The left ventricular ejection fraction is 55-65% by visual estimate.  Justin Carr is a 78 y.o. male   [id number] LOCATION:  FACILITY: [facility] PHYSICIAN: Nanetta Carr, M.D. March 17, 1946   DATE OF PROCEDURE:  04/11/2017  DATE  OF DISCHARGE:     CARDIAC CATHETERIZATION / PCI DES Prox LAD    History obtained from chart review.Justin Carr is a 78 y.o. mildly overweight married Caucasian male, father of 2 children (1 living), who I saw  03/07/16. 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 His problems include obstructive sleep apnea, on CPAP, hypertension, and hyperlipidemia. He denies chest pain or shortness of breath. He had a non-ST-segment-elevation myocardial infarction, July 02, 2007. He underwent PCI and stenting of his proximal LAD with a Taxus Liberte drug-eluting stent. He had also had a 60% right renal artery stenosis documented angiographically at that time, which we have been following by duplex ultrasound. Since I saw him back a year ago he denies chest pain or shortness  of breath but has noticed some left upper extremity discomfort with exertion similar to his pre-MI symptoms. . Recent lipid profile performed by his PCP 06/21/16 revealing a total cholesterol 113, LDL 48 and HDL of 35.  A  Myoview stress test was high risk with ischemia in the LAD territory suggesting potential restenosis within the LAD stent. He presents now for outpatient diagnostic coronary angiography to define his anatomy.     PROCEDURE DESCRIPTION:  The patient was brought to the second floor [facility] cath lab in the postabsorptive state. He was premedicated with Valium 5 mg by mouth, IV Versed and fentanyl. His right wrist was prepped and shaved in usual sterile fashion. Xylocaine 1% was used for local anesthesia. A 6 French sheath was inserted into the right radial artery using standard Seldinger technique. The patient received 5000 units  of heparin  intravenously.  A 5 [place] TIG catheter and pigtail catheters were used for selective coronary angiography and left ventriculography respectively. Isovue dye was used for the  entirety of the case. Retrograde aortic, left ventricular and pullback pressures were recorded. Radial cocktail was administered via the SideArm sheath.  The patient was already on aspirin and Plavix. He received Angiomax bolus followed by infusion with a therapeutic ACT. Using a 6 [place] XB LAD 3.5 cm guide catheter along with a 0.140 Pro water guidewire and a 2 mm x 12 mm balloon the proximal LAD stent was predilated. Following this I carefully positioned a 3 mm x 20 mm long synergy drug-eluting stent within the previously stented segment and deployed at 16 atm (3.23 mm) resulting in reduction of a 90% stenosis to 0% residual. I did give 200 g of intra-coronary nitroglycerin. There were no hemodynamic or echocardiographic sequela. The patient tolerated the procedure well. The guidewire and catheter were removed. The sheath was removed and a TR band was placed on the right wrist to achieve patent hemostasis. The patient left the lab in stable condition.  Impression Successful proximal LAD PCI and drug-eluting stent for in-stent restenosis within a previously placed LAD stent April 2009. The patient had an excellent result. He had already been on antiplatelet therapy. He will continue this indefinitely. He'll be hydrated overnight and discharged home in the morning. I will see him back in the office in 2-3 weeks.  Nanetta Carr. MD, [facility] 04/11/2017 4:29 PM  Findings Coronary Findings Diagnostic  Dominance: Right  Left Anterior Descending Prox LAD lesion is 99% stenosed. The lesion was previously treated.  Intervention  Prox LAD lesion Stent A stent was successfully placed. Post-Intervention Lesion Assessment There is a 0% residual stenosis post intervention.   STRESS TESTS  MYOCARDIAL PERFUSION IMAGING 04/04/2017  Narrative  The left ventricular ejection fraction is mildly decreased (45-54%).  Nuclear stress EF: 49%.  Defect 1: There is a large defect of moderate severity present  in the basal anteroseptal, mid anterior, mid anteroseptal, apical anterior, apical septal, apical inferior, apical lateral and apex location.  Findings consistent with ischemia.  This is a high risk study.  Horizontal ST segment depression ST segment depression of 3 mm was noted during stress in the V6, V5, V4, aVF and III leads, beginning at 2 minutes of stress.  High risk stress nuclear study with ischemia in the entire LAD artery distribution, possible small apical scar and mildly reduced left ventricular systolic function.   ECHOCARDIOGRAM  ECHOCARDIOGRAM COMPLETE 05/11/2021  Narrative ECHOCARDIOGRAM REPORT    Patient Name:   Justin Carr Date of  Exam: 05/11/2021 Medical Rec #:  [id number]    Height:       71.0 in Accession #:    [id number]   Weight:       212.0 lb Date of Birth:  February 14, 1946    BSA:          2.161 m Patient Age:    75 years     BP:           176/71 mmHg Patient Gender: M            HR:           70 bpm. Exam Location:  Inpatient  Procedure: 2D Echo, 3D Echo, Cardiac Doppler, Color Doppler and Strain Analysis  Indications:    CAD Native Vessel I25.10  History:        Patient has prior history of Echocardiogram examinations, most recent 10/06/2007. CAD; Risk Factors:Hypertension, Dyslipidemia, Sleep Apnea, Diabetes and Former Smoker.  Sonographer:    Leta Jungling RDCS Referring Phys: [phone] LINDSAY B ROBERTS  IMPRESSIONS   1. Left ventricular ejection fraction, by estimation, is 60 to 65%. The left ventricle has normal function. The left ventricle has no regional wall motion abnormalities. Left ventricular diastolic parameters are consistent with Grade I diastolic dysfunction (impaired relaxation). 2. Right ventricular systolic function is normal. The right ventricular size is normal. 3. The mitral valve is grossly normal. Trivial mitral valve regurgitation. 4. The aortic valve is tricuspid. There is mild calcification of the aortic valve. There is mild  thickening of the aortic valve. Aortic valve regurgitation is not visualized. Aortic valve sclerosis/calcification is present, without any evidence of aortic stenosis. 5. Aortic dilatation noted. There is borderline dilatation of the ascending aorta, measuring 36 mm. 6. The inferior vena cava is normal in size with greater than 50% respiratory variability, suggesting right atrial pressure of 3 mmHg.  FINDINGS Left Ventricle: Left ventricular ejection fraction, by estimation, is 60 to 65%. The left ventricle has normal function. The left ventricle has no regional wall motion abnormalities. 3D left ventricular ejection fraction analysis performed but not reported based on interpreter judgement due to suboptimal tracking. The left ventricular internal cavity size was normal in size. There is no left ventricular hypertrophy. Left ventricular diastolic parameters are consistent with Grade I diastolic dysfunction (impaired relaxation).  Right Ventricle: The right ventricular size is normal. No increase in right ventricular wall thickness. Right ventricular systolic function is normal.  Left Atrium: Left atrial size was normal in size.  Right Atrium: Right atrial size was normal in size.  Pericardium: There is no evidence of pericardial effusion.  Mitral Valve: The mitral valve is grossly normal. There is mild thickening of the mitral valve leaflet(s). Mild to moderate mitral annular calcification. Trivial mitral valve regurgitation.  Tricuspid Valve: The tricuspid valve is normal in structure. Tricuspid valve regurgitation is trivial.  Aortic Valve: The aortic valve is tricuspid. There is mild calcification of the aortic valve. There is mild thickening of the aortic valve. Aortic valve regurgitation is not visualized. Aortic valve sclerosis/calcification is present, without any evidence of aortic stenosis.  Pulmonic Valve: The pulmonic valve was normal in structure. Pulmonic valve regurgitation is  trivial.  Aorta: Aortic dilatation noted. There is borderline dilatation of the ascending aorta, measuring 36 mm.  Venous: The inferior vena cava is normal in size with greater than 50% respiratory variability, suggesting right atrial pressure of 3 mmHg.  IAS/Shunts: The atrial septum is grossly normal.   LEFT VENTRICLE PLAX 2D  LVIDd:         0.79 cm   Diastology LVIDs:         3.20 cm   LV e' medial:    10.30 cm/s LV PW:         0.90 cm   LV E/e' medial:  9.1 LV IVS:        4.35 cm   LV e' lateral:   13.80 cm/s LVOT diam:     2.10 cm   LV E/e' lateral: 6.8 LV SV:         82 LV SV Index:   38        2D Longitudinal Strain LVOT Area:     3.46 cm  2D Strain GLS Avg:     -21.0 %  3D Volume EF: 3D EF:        56 % LV EDV:       155 ml LV ESV:       68 ml LV SV:        87 ml  RIGHT VENTRICLE RV S prime:     13.80 cm/s TAPSE (M-mode): 1.9 cm  LEFT ATRIUM             Index        RIGHT ATRIUM           Index LA diam:        2.80 cm 1.30 cm/m   RA Area:     11.60 cm LA Vol (A2C):   44.4 ml 20.54 ml/m  RA Volume:   23.80 ml  11.01 ml/m LA Vol (A4C):   43.0 ml 19.89 ml/m LA Biplane Vol: 43.8 ml 20.26 ml/m AORTIC VALVE LVOT Vmax:   95.80 cm/s LVOT Vmean:  70.800 cm/s LVOT VTI:    0.236 m  MITRAL VALVE MV Area (PHT): 3.23 cm     SHUNTS MV Decel Time: 235 msec     Systemic VTI:  0.24 m MV E velocity: 93.80 cm/s   Systemic Diam: 2.10 cm MV A velocity: 105.00 cm/s MV E/A ratio:  0.89  Laurance Flatten MD Electronically signed by Laurance Flatten MD Signature Date/Time: 05/11/2021/4:57:35 PM    Final   TEE  ECHO INTRAOPERATIVE TEE 05/17/2021  Narrative *INTRAOPERATIVE TRANSESOPHAGEAL REPORT *    Patient Name:   Justin Carr Date of Exam: 05/17/2021 Medical Rec #:  [id number]    Height:       71.0 in Accession #:    [id number]   Weight:       212.0 lb Date of Birth:  1945/07/08    BSA:          2.16 m Patient Age:    75 years     BP:           122/52  mmHg Patient Gender: M            HR:           53 bpm. Exam Location:  Inpatient  Transesophogeal exam was perform intraoperatively during surgical procedure. Patient was closely monitored under general anesthesia during the entirety of examination.  Indications:     I25.110 Atherosclerotic heart disease of native coronary artery with unstable angina pectoris Performing Phys: [id number] Eliezer Lofts LIGHTFOOT Diagnosing Phys: Dorris Singh MD  Complications: No known complications during this procedure. POST-OP IMPRESSIONS _ Left Ventricle: Post Bypass The patient came off bypass on the initial attempt. Left ventricular contraction did improve with volume replaccement. There were no significant  changes from preop exam. The TEE that had been placed afetr induction uneventfully was removed at the end of the exam without difficulty. The patient was later takento the SICU in stable conditions.  PRE-OP FINDINGS Left Ventricle: The left ventricle has normal systolic function, with an ejection fraction of 55-60%.   Right Ventricle: The right ventricle has normal systolic function.  Left Atrium: No left atrial/left atrial appendage thrombus was detected.  Mitral Valve: The mitral valve is normal in structure. Mitral valve regurgitation is mild by color flow Doppler.  Tricuspid Valve: The tricuspid valve was normal in structure.  Aortic Valve: The aortic valve is tricuspid.   Pulmonic Valve: The pulmonic valve was normal in structure.     Dorris Singh MD Electronically signed by Dorris Singh MD Signature Date/Time: 05/17/2021/6:13:19 PM   Final  MONITORS  LONG TERM MONITOR (3-14 DAYS) 11/13/2021  Narrative   7 day monitor   Rare supraventricular ectopy in the form of isolated PACs, couplets, triplets. Two runs of SVT longest 11 beats.   Rare ventricular ectopy in the form of isolated PVCs   No reported symptoms   Patch Wear Time:  7 days and 1 hours  (2023-08-03T21:05:27-0400 to 2023-08-10T22:46:38-0400)  Patient had a min HR of 45 bpm, max HR of 135 bpm, and avg HR of 63 bpm. Predominant underlying rhythm was Sinus Rhythm. First Degree AV Block was present. 2 Supraventricular Tachycardia runs occurred, the run with the fastest interval lasting 4 beats with a max rate of 135 bpm, the longest lasting 11 beats with an avg rate of 106 bpm. Isolated SVEs were rare (<1.0%), SVE Couplets were rare (<1.0%), and SVE Triplets were rare (<1.0%). Isolated VEs were rare (<1.0%), and no VE Couplets or VE Triplets were present. Ventricular Bigeminy was present.       ______________________________________________________________________________________________        Current Reported Medications:.    Current Meds  Medication Sig   Alpha-Lipoic Acid 300 MG CAPS Take 300 mg by mouth in the morning and at bedtime.   aspirin 81 MG tablet Take 1 tablet (81 mg total) by mouth daily. (Patient taking differently: Take 81 mg by mouth at bedtime.)   Coenzyme Q10 (COQ-10 PO) Take 1 capsule by mouth at bedtime.    dapagliflozin propanediol (FARXIGA) 5 MG TABS tablet Take 5 mg by mouth daily.   metoprolol tartrate (LOPRESSOR) 25 MG tablet Take 1/2 (one-half) tablet by mouth twice daily   Multiple Vitamin (MULTIVITAMIN WITH MINERALS) TABS tablet Take 1 tablet by mouth daily.   Multiple Vitamins-Minerals (PRESERVISION AREDS PO) Take 1 capsule by mouth 2 (two) times daily.   nitroGLYCERIN (NITROSTAT) 0.4 MG SL tablet DISSOLVE ONE TABLET UNDER THE TONGUE EVERY 5 MINUTES AS NEEDED FOR CHEST PAIN.  DO NOT EXCEED A TOTAL OF 3 DOSES IN 15 MINUTES   pregabalin (LYRICA) 100 MG capsule Take 100 mg by mouth 2 (two) times daily.   ramipril (ALTACE) 10 MG capsule Take 1 capsule by mouth once daily   rosuvastatin (CRESTOR) 20 MG tablet TAKE 1 TABLET BY MOUTH THREE TIMES A WEEK   sildenafil (VIAGRA) 100 MG tablet Take 100 mg by mouth daily as needed for erectile dysfunction.    TRULICITY 0.75 MG/0.5ML SOPN Inject 0.75 mg into the skin every Monday.    Physical Exam:    VS:  BP 128/73   Pulse 62   Ht 5\' 11"  (1.803 m)   Wt 214 lb 6.4 oz (97.3 kg)   SpO2 95%  BMI 29.90 kg/m    Wt Readings from Last 3 Encounters:  05/17/23 214 lb 6.4 oz (97.3 kg)  04/26/23 214 lb (97.1 kg)  04/24/22 217 lb 3.2 oz (98.5 kg)    GEN: Well nourished, well developed in no acute distress NECK: No JVD; No carotid bruits CARDIAC: RRR, no murmurs, rubs, gallops RESPIRATORY:  Clear to auscultation without rales, wheezing or rhonchi  ABDOMEN: Soft, non-tender, non-distended EXTREMITIES:  No edema; No acute deformity   Asessement and Plan:.    CAD: Status post NSTEMI in 2009 with stent to proximal LAD.  In 2019 following an abnormal Myoview stress test he underwent repeat cath revealing 99% in-stent restenosis of his proximal LAD, restented with 3 mm x 20 mm DES.  Since 04/2021 patient had recurrent chest pain revealing a 70% ostial left main stenosis and 75% proximal RCA stenosis.  He underwent CABG with LIMA to LAD, vein to obtuse marginal branch and PDA with Dr. Cliffton Asters on 05/17/2021. Today patient reports that he had been doing well until a few weeks ago, he now notes left arm pain more towards his elbow. He notes his prior anginal equivalent was left bicep pain that started at the shoulder and radiated down to his elbow. He does not feel this pain is nearly as intense as his prior anginal equivalent. He notes he has not been exercising recently, though does walk his dog and has had arm pain with this but is unable to tell if related to exertion or his dog pulling his arm. His EKG today shows T wave inversions in inferior leads, new compared to EKG from 1/31. Discussed with Dr. Herbie Baltimore DOD at [facility] office today who agreed with plan of nuclear stress test, patient prefers to complete with treadmill as he has done this in the past and tolerated.  He will hold metoprolol tartrate morning  of procedure.  Patient agreeable to heart catheterization if abnormality noted on stress test.  Patient will also start using his right arm to walk his dog and continue to monitor symptoms of left bicep pain.  Reviewed ED precautions. Continue aspirin 81 mg daily, metoprolol tartrate 12.5 mg twice daily, ramipril 10 mg daily, Crestor 20 mg daily Informed Consent   Shared Decision Making/Informed Consent The risks [chest pain, shortness of breath, cardiac arrhythmias, dizziness, blood pressure fluctuations, myocardial infarction, stroke/transient ischemic attack, nausea, vomiting, allergic reaction, radiation exposure, metallic taste sensation and life-threatening complications (estimated to be 1 in 10,000)], benefits (risk stratification, diagnosing coronary artery disease, treatment guidance) and alternatives of a nuclear stress test were discussed in detail with Mr. Wentworth and he agrees to proceed.     Left arm numbness/facial numbness: Patient reports that a few days after his appointment with Dr. Allyson Sabal he developed left arm numbness that radiated into his face while he was attending church. Episode lasted 30 mins, he denies any weakness, slurred or changed speech, asymmetric facial expression, he denies any other symptoms. He notes he has had intermittent numbness since. He denies any palpitations. Patient's speech is clear, no facial asymmetry, he has equal strength. Discussed with Dr. Herbie Baltimore, DOD at [facility] who agreed with recommendation patient follow up with his PCP. Will check carotid dopplers, pending ischemic evaluation as above. He plans to follow up with PCP to determine if further testing is needed.   Hypertension: Blood pressure today 128/73. Continue current antihypertensive regimen.   Hyperlipidemia: Last lipid profile on 04/22/2023 indicated total cholesterol 125, HDL 45 and LDL of 59.  Continue  Crestor 20 mg daily.  Renal artery stenosis: History of 60% right renal artery stenosis at  time of cath in 2009.  Per Dr. Benay Spice note serial surveillance with renal Dopplers showed no evidence of significant progression.     Disposition: F/u with Reather Littler, NP following testing.   Signed, Rip Harbour, NP

## 2023-05-17 ENCOUNTER — Encounter: Payer: Self-pay | Admitting: Cardiology

## 2023-05-17 ENCOUNTER — Ambulatory Visit: Payer: Medicare Other | Attending: Cardiology | Admitting: Cardiology

## 2023-05-17 VITALS — BP 128/73 | HR 62 | Ht 71.0 in | Wt 214.4 lb

## 2023-05-17 DIAGNOSIS — E785 Hyperlipidemia, unspecified: Secondary | ICD-10-CM | POA: Diagnosis not present

## 2023-05-17 DIAGNOSIS — I6523 Occlusion and stenosis of bilateral carotid arteries: Secondary | ICD-10-CM

## 2023-05-17 DIAGNOSIS — G4733 Obstructive sleep apnea (adult) (pediatric): Secondary | ICD-10-CM

## 2023-05-17 DIAGNOSIS — E119 Type 2 diabetes mellitus without complications: Secondary | ICD-10-CM

## 2023-05-17 DIAGNOSIS — R0989 Other specified symptoms and signs involving the circulatory and respiratory systems: Secondary | ICD-10-CM

## 2023-05-17 DIAGNOSIS — I10 Essential (primary) hypertension: Secondary | ICD-10-CM | POA: Diagnosis not present

## 2023-05-17 DIAGNOSIS — R079 Chest pain, unspecified: Secondary | ICD-10-CM | POA: Diagnosis not present

## 2023-05-17 DIAGNOSIS — Z951 Presence of aortocoronary bypass graft: Secondary | ICD-10-CM

## 2023-05-17 NOTE — Patient Instructions (Addendum)
Medication Instructions:  No changes *If you need a refill on your cardiac medications before your next appointment, please call your pharmacy*  Lab Work: No labs  Testing/Procedures: Your physician has requested that you have en exercise stress myoview. For further information please visit [email or web address] Please follow instruction sheet, as given.  Your physician has requested that you have a carotid duplex. This test is an ultrasound of the carotid arteries in your neck. It looks at blood flow through these arteries that supply the brain with blood. Allow one hour for this exam. There are no restrictions or special instructions. This will take place at [address] [facility], [address].  Please note: We ask at that you not bring children with you during ultrasound (echo/ vascular) testing. Due to room size and safety concerns, children are not allowed in the ultrasound rooms during exams. Our front office staff cannot provide observation of children in our lobby area while testing is being conducted. An adult accompanying a patient to their appointment will only be allowed in the ultrasound room at the discretion of the ultrasound technician under special circumstances. We apologize for any inconvenience.  Follow-Up: At [facility], you and your health needs are our priority.  As part of our continuing mission to provide you with exceptional heart care, we have created designated Provider Care Teams.  These Care Teams include your primary Cardiologist (physician) and Advanced Practice Providers (APPs -  Physician Assistants and Nurse Practitioners) who all work together to provide you with the care you need, when you need it.  We recommend signing up for the patient portal called "MyChart".  Sign up information is provided on this After Visit Summary.  MyChart is used to connect with patients for Virtual Visits (Telemedicine).  Patients are able to view lab/test results, encounter  notes, upcoming appointments, etc.  Non-urgent messages can be sent to your provider as well.   To learn more about what you can do with MyChart, go to [email or web address].    Your next appointment:   4-6 week(s) or after testing  Provider:   Reather Littler, NP  Other Instructions: Follow up with PCP

## 2023-05-20 ENCOUNTER — Encounter: Payer: Self-pay | Admitting: Podiatry

## 2023-05-20 ENCOUNTER — Ambulatory Visit (INDEPENDENT_AMBULATORY_CARE_PROVIDER_SITE_OTHER): Payer: Federal, State, Local not specified - PPO | Admitting: Podiatry

## 2023-05-20 ENCOUNTER — Encounter (HOSPITAL_COMMUNITY): Payer: Self-pay

## 2023-05-20 ENCOUNTER — Ambulatory Visit (INDEPENDENT_AMBULATORY_CARE_PROVIDER_SITE_OTHER): Payer: Federal, State, Local not specified - PPO

## 2023-05-20 DIAGNOSIS — G8929 Other chronic pain: Secondary | ICD-10-CM | POA: Diagnosis not present

## 2023-05-20 DIAGNOSIS — M79674 Pain in right toe(s): Secondary | ICD-10-CM

## 2023-05-20 DIAGNOSIS — L600 Ingrowing nail: Secondary | ICD-10-CM

## 2023-05-20 NOTE — Addendum Note (Signed)
 Addended by: Clotilde Dieter on: 05/20/2023 07:47 AM   Modules accepted: Orders

## 2023-05-20 NOTE — Addendum Note (Signed)
 Addended by: Reather Littler on: 05/20/2023 10:48 AM   Modules accepted: Orders

## 2023-05-20 NOTE — Patient Instructions (Signed)

## 2023-05-20 NOTE — Progress Notes (Signed)
 Subjective:   Patient ID: Justin Carr, male   DOB: 78 y.o.   MRN: [id number]   HPI Patient states has developed a lot of thickness and pain in the right big toenail and it is growing abnormally pressing against my second toe and is sore.  Patient does not smoke likes to be active   Review of Systems  All other systems reviewed and are negative.       Objective:  Physical Exam Vitals and nursing note reviewed.  Constitutional:      Appearance: He is well-developed.  Pulmonary:     Effort: Pulmonary effort is normal.  Musculoskeletal:        General: Normal range of motion.  Skin:    General: Skin is warm.  Neurological:     Mental Status: He is alert.     Neurovascular status intact muscle strength adequate range of motion adequate with patient found to have significant thickness of the right big toenail growing in an abnormal direction pressing against the second toe with inflammation irritation noted of the bed.  Good digital perfusion well oriented x 3     Assessment:  Significant damage right hallux nail bed thickness and pain when palpated     Plan:  H&P reviewed recommended correction of deformity explained procedure risk patient wants surgery and I explained the procedure.  This had regrown from a procedure done a number of years ago so today I did go ahead I anesthetized the right hallux 60 mg like Marcaine mixture sterile prep done using sterile instrumentation removed the right hallux nail applied sodium hydroxide 5 applications 10 seconds followed by vinegar lavage applied sterile dressing instructed on soaks leave dressing on 24 hours take it off earlier if throbbing were to occur and encouraged to call questions concerns which may arise

## 2023-05-21 ENCOUNTER — Telehealth: Payer: Self-pay

## 2023-05-21 NOTE — Telephone Encounter (Signed)
 Detailed instructions left on the patient's answering machine. Asked to call back with any questions.  Justin Carr CCT

## 2023-05-26 ENCOUNTER — Ambulatory Visit: Admission: EM | Admit: 2023-05-26 | Discharge: 2023-05-26 | Disposition: A | Discharge status: Home / Self Care

## 2023-05-26 DIAGNOSIS — Z7984 Long term (current) use of oral hypoglycemic drugs: Secondary | ICD-10-CM | POA: Diagnosis not present

## 2023-05-26 DIAGNOSIS — L089 Local infection of the skin and subcutaneous tissue, unspecified: Secondary | ICD-10-CM

## 2023-05-26 DIAGNOSIS — E1140 Type 2 diabetes mellitus with diabetic neuropathy, unspecified: Secondary | ICD-10-CM | POA: Diagnosis not present

## 2023-05-26 MED ORDER — DOXYCYCLINE HYCLATE 100 MG PO CAPS: 100.0000 mg | ORAL_CAPSULE | Freq: Two times a day (BID) | ORAL | 0 refills | Status: AC

## 2023-05-26 NOTE — ED Triage Notes (Signed)
"  I had a nail removal from the big toe (right foot) on Monday, now it is hurting and may be infected". No fever known.

## 2023-05-27 ENCOUNTER — Encounter: Payer: Self-pay | Admitting: Podiatry

## 2023-05-27 NOTE — ED Provider Notes (Addendum)
 [facility]    CSN: [id number] Arrival date & time: 05/26/23  1457      History   Chief Complaint Chief Complaint  Patient presents with   Post-op Problem    HPI Justin Carr is a 78 y.o. male.   Patient here for evaluation of possible infection where he had toenail removed from right great toe.  Procedure was a week ago. He reports he has had increased swelling in the area.  He has known neuropathy at baseline and is a diabetic which concerned him.  He does report increased pain recently.  The history is provided by the patient.    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   Right inguinal hernia 01/05/2018   CAD (coronary artery disease) 04/11/2017   Stable angina (HCC)    Abnormal nuclear stress test    Diabetic neuropathy (HCC) 04/20/2015   Paresthesia 03/23/2015   Palpitations 03/24/2014   Coronary artery disease 04/21/2013   Essential hypertension 04/21/2013   Hyperlipidemia 04/21/2013   Diabetes (HCC) 04/21/2013   Obstructive sleep apnea 04/21/2013   Renal artery stenosis (HCC) 04/21/2013    Past Surgical History:  Procedure Laterality Date   APPENDECTOMY     CARDIOVASCULAR STRESS TEST  10/06/2007   No scintigraphic evidence of inducible myocardial ischemia. ECG positive for ischemia. Pharmacologically induced ST segment depression. Perfusion defect seen in inferior myocardial region consistent with diaphragmatic attenuation.   CORONARY ANGIOPLASTY WITH STENT PLACEMENT   07/03/2007   LAD 90% ulcerated plaque proximal between 1st and 2nd branches stented with a 2.75x22mm Taxus Liberte stent deployed at 16atm. Postdilated at 16atm (3.85mm). resulting in reduction of 90% lesion to 0% residual with TIMI3 flow. Impingement on theostium of diag branch dilatedat 6atm resulting in reduction of 90% ostial D2 to <50% residual.   CORONARY ANGIOPLASTY WITH STENT PLACEMENT  04/11/2017   CORONARY ARTERY BYPASS GRAFT N/A 05/17/2021   Procedure: CORONARY ARTERY BYPASS GRAFTING TIMES THREE USING LEFT INTERNAL  MAMMARY ARTERY AND RIGHT SAPHENOUS VEIN HARVESTED ENDOSCOPICALLY;  Surgeon: Corliss Skains, MD;  Location: [facility] OR;  Service: Open Heart Surgery;  Laterality: N/A;   CORONARY ULTRASOUND/IVUS N/A 05/11/2021   Procedure: Intravascular Ultrasound/IVUS;  Surgeon: Runell Gess, MD;  Location: [facility] INVASIVE CV LAB;  Service: Cardiovascular;  Laterality: N/A;   INGUINAL HERNIA REPAIR Right 01/06/2018   Procedure: OPEN RIGHT INGUINAL HERNIA REPAIR WITH MESH;  Surgeon: Darnell Level, MD;  Location: [facility] OR;  Service: General;  Laterality: Right;   INSERTION OF MESH Right 01/06/2018   Procedure: INSERTION OF MESH;  Surgeon: Darnell Level, MD;  Location: [facility] OR;  Service: General;  Laterality: Right;   LAPAROSCOPIC APPENDECTOMY N/A 04/01/2018   Procedure: APPENDECTOMY LAPAROSCOPIC;  Surgeon: Darnell Level, MD;  Location: [facility] ORS;  Service: General;  Laterality: N/A;   LEFT HEART CATH AND CORONARY ANGIOGRAPHY N/A 04/11/2017   Procedure: LEFT HEART CATH AND CORONARY ANGIOGRAPHY;  Surgeon: Runell Gess,  MD;  Location: [facility] INVASIVE CV LAB;  Service: Cardiovascular;  Laterality: N/A;   LEFT HEART CATH AND CORONARY ANGIOGRAPHY N/A 05/11/2021   Procedure: LEFT HEART CATH AND CORONARY ANGIOGRAPHY;  Surgeon: Runell Gess, MD;  Location: [facility] INVAS[facility] CV LAB;  Service: Cardiovascular;  Laterality: N/A;   LIGAMENT REPAIR Right 03/2001   wrist   RENAL ARTERIAL DOPPLER  05/20/2012   SMA and  Cephalic artery >60% diameter reduction, Rt prox Renal artery 60-99% diameter reduction, Lft prox Renal artery 1-59% diameter reduction, kidneys are normal in size.   SHOULDER ARTHROSCOPY WITH SUBACROMIAL DECOMPRESSION Right 04/03/2013   Procedure: RIGHT SHOULDER ARTHROSCOPY WITH SUBACROMIAL DECOMPRESSION/DISTAL CLAVICLE RESECTION;  Surgeon: Senaida Lange, MD;  Location: [facility] OR;  Service: Orthopedics;  Laterality: Right;   TEE WITHOUT CARDIOVERSION N/A 05/17/2021   Procedure: TRANSESOPHAGEAL ECHOCARDIOGRAM (TEE);  Surgeon: Corliss Skains, MD;  Location[facility];  Service: Open Heart Surgery;  Laterality: N/A;   TONSILLECTOMY AND ADENOIDECTOMY  ~ 1952   TRANSTHORACIC ECHOCARDIOGRAM  10/06/2007   EF 63%, normal.   WISDOM TOOTH EXTRACTION     age 23       Home Medications    Prior to Admission medications   Medication Sig Start Date End Date Taking? Authorizing Provider  aspirin 81 MG tablet Take 1 tablet (81 mg total) by mouth daily. Patient taking differently: Take 81 mg by mouth at bedtime. 04/12/17  Yes Bhagat, Bhavinkumar, PA  Coenzyme Q10 (COQ-10 PO) Take 1 capsule by mouth at bedtime.    Yes [provider]  dapagliflozin propanediol (FARXIGA) 5 MG TABS tablet Take 5 mg by mouth daily.   Yes [provider]  doxycycline (VIBRAMYCIN) 100 MG capsule Take 1 capsule (100 mg total) by mouth 2 (two) times daily for 7 days. 05/26/23 06/02/23 Yes Tomi Bamberger, PA-C  metoprolol tartrate (LOPRESSOR) 25 MG tablet Take 1/2 (one-half) tablet by mouth twice daily 05/06/23  Yes Runell Gess, MD  Multiple Vitamin (MULTIVITAMIN WITH MINERALS) TABS tablet Take 1 tablet by mouth daily.   Yes [provider]  pregabalin (LYRICA) 100 MG capsule Take 100 mg by mouth 2 (two) times daily. 05/12/19  Yes [provider]  ramipril (ALTACE) 10 MG capsule Take 1 capsule by mouth once daily 04/22/23  Yes Runell Gess, MD  rosuvastatin (CRESTOR) 20 MG tablet TAKE 1  TABLET BY MOUTH THREE TIMES A WEEK 03/18/23  Yes Runell Gess, MD  sildenafil (VIAGRA) 100 MG tablet Take 100 mg by mouth daily as needed for erectile dysfunction.   Yes [provider]  TRULICITY 1.5 MG/0.5ML SOAJ Inject into the skin. 05/25/23  Yes [provider]  Alpha-Lipoic Acid 300 MG CAPS Take 300 mg by mouth in the morning and at bedtime.    [provider]  Multiple Vitamins-Minerals (PRESERVISION AREDS PO) Take 1 capsule by mouth 2 (two) times daily.    [provider]  nitroGLYCERIN (NITROSTAT) 0.4 MG SL tablet DISSOLVE ONE TABLET UNDER THE TONGUE EVERY 5 MINUTES AS NEEDED FOR CHEST PAIN.  DO NOT EXCEED A TOTAL OF 3 DOSES IN 15 MINUTES 11/28/20   Runell Gess, MD    Family History Family History  Problem Relation Age of Onset   Heart attack Maternal Grandfather    Parkinson's disease Paternal Grandfather    Heart disease Mother    Arrhythmia Father    Dementia Father     Social History Social History   Tobacco Use   Smoking  status: Former    Current packs/day: 0.00    Average packs/day: 1 pack/day for 34.0 years (34.0 ttl pk-yrs)    Types: Cigarettes    Start date: 07/02/1973    Quit date: 07/03/2007    Years since quitting: 15.9   Smokeless tobacco: Never  Vaping Use   Vaping status: Never Used  Substance Use Topics   Alcohol use: Yes    Comment: 04/11/2017 "might have a beer q other week"   Drug use: No     Allergies   Peanuts [peanut oil], Lipitor [atorvastatin], and Rosuvastatin   Review of Systems Review of Systems  Constitutional:  Negative for chills and fever.  Eyes:  Negative for discharge and redness.  Respiratory:  Negative for shortness of breath.   Skin:  Positive for color change and wound.  Neurological:  Positive for numbness.     Physical Exam Triage Vital Signs ED Triage Vitals  Encounter Vitals Group     BP 05/26/23 1536 121/61     Systolic BP Percentile --      Diastolic BP Percentile --       Pulse Rate 05/26/23 1536 70     Resp 05/26/23 1536 18     Temp 05/26/23 1536 97.8 F (36.6 C)     Temp Source 05/26/23 1536 Oral     SpO2 05/26/23 1536 96 %     Weight 05/26/23 1533 214 lb 8.1 oz (97.3 kg)     Height 05/26/23 1533 5\' 11"  (1.803 m)     Head Circumference --      Peak Flow --      Pain Score 05/26/23 1530 6     Pain Loc --      Pain Education --      Exclude from Growth Chart --    No data found.  Updated Vital Signs BP 121/61 (BP Location: Left Arm)   Pulse 70   Temp 97.8 F (36.6 C) (Oral)   Resp 18   Ht 5\' 11"  (1.803 m)   Wt 214 lb 8.1 oz (97.3 kg)   SpO2 96%   BMI 29.92 kg/m   Visual Acuity Right Eye Distance:   Left Eye Distance:   Bilateral Distance:    Right Eye Near:   Left Eye Near:    Bilateral Near:     Physical Exam Vitals and nursing note reviewed.  Constitutional:      General: He is not in acute distress.    Appearance: Normal appearance. He is not ill-appearing.  HENT:     Head: Normocephalic and atraumatic.  Eyes:     Conjunctiva/sclera: Conjunctivae normal.  Cardiovascular:     Rate and Rhythm: Normal rate.  Pulmonary:     Effort: Pulmonary effort is normal. No respiratory distress.  Skin:    Capillary Refill: Normal cap refill to right great toe    Comments: Diffuse swelling and erythema to distal right great toe with toenail avulsion noted/ scabbed area of nail bed  Neurological:     Mental Status: He is alert.     Comments: Gross sensation intact to distal right great toe  Psychiatric:        Mood and Affect: Mood normal.        Behavior: Behavior normal.        Thought Content: Thought content normal.      UC Treatments / Results  Labs (all labs ordered are listed, but only abnormal results are displayed) Labs Reviewed - No  data to display  EKG   Radiology No results found.  Procedures Procedures (including critical care time)  Medications Ordered in UC Medications - No data to display  Initial  Impression / Assessment and Plan / UC Course  I have reviewed the triage vital signs and the nursing notes.  Pertinent labs & imaging results that were available during my care of the patient were reviewed by me and considered in my medical decision making (see chart for details).    Will treat to cover secondary infection with doxycycline, specifically due to DM,  and advised follow up if no gradual improvement or with any further concerns.  Final Clinical Impressions(s) / UC Diagnoses   Final diagnoses:  Infection of great toe  Type 2 diabetes mellitus with diabetic neuropathy, without long-term current use of insulin [facility])   Discharge Instructions   None    ED Prescriptions     Medication Sig Dispense Auth. Provider   doxycycline (VIBRAMYCIN) 100 MG capsule Take 1 capsule (100 mg total) by mouth 2 (two) times daily for 7 days. 14 capsule Tomi Bamberger, PA-C      PDMP not reviewed this encounter.   Tomi Bamberger, PA-C 05/27/23 1531    Tomi Bamberger, PA-C 05/27/23 [phone]

## 2023-05-28 ENCOUNTER — Ambulatory Visit (HOSPITAL_COMMUNITY): Payer: Medicare Other | Attending: Cardiology

## 2023-05-28 DIAGNOSIS — R079 Chest pain, unspecified: Secondary | ICD-10-CM

## 2023-05-28 LAB — MYOCARDIAL PERFUSION IMAGING
LV dias vol: 86 mL (ref 62–150)
LV sys vol: 35 mL
Nuc Stress EF: 59 %
Peak HR: 76 {beats}/min
Rest HR: 59 {beats}/min
Rest Nuclear Isotope Dose: 10.2 mCi
SDS: 0
SRS: 0
SSS: 0
ST Depression (mm): 0 mm
Stress Nuclear Isotope Dose: 32.8 mCi
TID: 1.23

## 2023-05-28 MED ORDER — TECHNETIUM TC 99M TETROFOSMIN IV KIT
10.2000 | PACK | Freq: Once | INTRAVENOUS | Status: AC | PRN
Start: 2023-05-28 — End: 2023-05-28
  Administered 2023-05-28: 10.2 via INTRAVENOUS

## 2023-05-28 MED ORDER — REGADENOSON 0.4 MG/5ML IV SOLN
0.4000 mg | Freq: Once | INTRAVENOUS | Status: AC
  Administered 2023-05-28: 0.4 mg via INTRAVENOUS

## 2023-05-28 MED ORDER — TECHNETIUM TC 99M TETROFOSMIN IV KIT
32.8000 | PACK | Freq: Once | INTRAVENOUS | Status: AC | PRN
  Administered 2023-05-28: 32.8 via INTRAVENOUS

## 2023-05-31 ENCOUNTER — Telehealth: Payer: Self-pay

## 2023-05-31 NOTE — Telephone Encounter (Signed)
-----   Message from Rip Harbour sent at 05/30/2023  5:16 PM EST ----- Please let Justin Carr know that I have reviewed his recent stress test with Dr. Allyson Sabal. There is no evidence of significant decreased blood flow to the heart muscle, this is very reassuring. However given your symptoms he recommended a trial of low dose Imdur to see if this improves your symptoms. No indication for further testing at this time. Continue to monitor symptoms and follow up as planned. Please send prescription for Imdur 15 mg daily to his pharmacy.

## 2023-05-31 NOTE — Telephone Encounter (Signed)
 Left message to call back

## 2023-06-03 NOTE — Telephone Encounter (Signed)
 Left message to call back

## 2023-06-04 NOTE — Telephone Encounter (Signed)
 Pt returning call for results

## 2023-06-04 NOTE — Telephone Encounter (Signed)
  Rip Harbour, NP 05/30/2023  5:16 PM EST     Please let Mr. Justin Carr know that I have reviewed his recent stress test with Dr. Allyson Sabal. There is no evidence of significant decreased blood flow to the heart muscle, this is very reassuring. However given your symptoms he recommended a trial of low dose Imdur to see if this improves your symptoms. No indication for further testing at this time. Continue to monitor symptoms and follow up as planned. Please send prescription for Imdur 15 mg daily to his pharmacy.   Spoke with pt regarding his results. Patient identification verified by 2 forms. -Results given. -Pt reports that he is no longer having symptoms and hasn't had symptoms since seeing Katlyn, NP.  -Will hold off on starting Imdur. -Pt will continue with carotid doppler. -Pt questions if return office visit on 4/1 is necessary. Advised pt that if carotid dopplers come back normal we could possibly cancel return office visit.  Pt has no further questions at this time.

## 2023-06-05 ENCOUNTER — Other Ambulatory Visit: Payer: Self-pay | Admitting: Podiatry

## 2023-06-05 ENCOUNTER — Ambulatory Visit (INDEPENDENT_AMBULATORY_CARE_PROVIDER_SITE_OTHER): Admitting: Podiatry

## 2023-06-05 ENCOUNTER — Encounter: Payer: Self-pay | Admitting: Podiatry

## 2023-06-05 DIAGNOSIS — L97512 Non-pressure chronic ulcer of other part of right foot with fat layer exposed: Secondary | ICD-10-CM

## 2023-06-05 DIAGNOSIS — M79674 Pain in right toe(s): Secondary | ICD-10-CM | POA: Diagnosis not present

## 2023-06-05 DIAGNOSIS — G8929 Other chronic pain: Secondary | ICD-10-CM | POA: Diagnosis not present

## 2023-06-05 MED ORDER — GENTAMICIN SULFATE 0.1 % EX CREA: 1.0000 | TOPICAL_CREAM | Freq: Two times a day (BID) | CUTANEOUS | 1 refills | Status: DC

## 2023-06-05 NOTE — Progress Notes (Signed)
 Chief Complaint  Patient presents with   Follow-up    Patient states that he came in 2 weeks ago to get ingrown removed on right foot, and a week later it looked real bad so he went to urgent care, they gave him antibiotics for a week and is now done with. Patient has neuropathy in his bilateral feet and he can still feel the pain in his right hallux.  Patient still has a little drainage to left hallux.    HPI: 78 y.o. male presenting today for follow-up evaluation after nail avulsion procedure to the right hallux nail plate performed on 05/20/2023 with Dr. Charlsie Merles here in our office.  Patient continues to have pain and tenderness to the area and he actually went to the urgent care and was placed on oral antibiotics.  He presents for further treatment evaluation  Past Medical History:  Diagnosis Date   Coronary artery disease    GERD (gastroesophageal reflux disease)    Hyperlipidemia    Hypertension    NSTEMI (non-ST elevated myocardial infarction) (HCC) 06/2007   [name] 07/27/2010   OSA on CPAP    Palpitations    Pneumonia ~ 1971 X 1   Type II diabetes mellitus (HCC)     Past Surgical History:  Procedure Laterality Date   APPENDECTOMY     CARDIOVASCULAR STRESS TEST  10/06/2007   No scintigraphic evidence of inducible myocardial ischemia. ECG positive for ischemia. Pharmacologically induced ST segment depression. Perfusion defect seen in inferior myocardial region consistent with diaphragmatic attenuation.   CORONARY ANGIOPLASTY WITH STENT PLACEMENT  07/03/2007   LAD 90% ulcerated plaque proximal between 1st and 2nd branches stented with a 2.75x82mm Taxus Liberte stent deployed at 16atm. Postdilated at 16atm (3.61mm). resulting in reduction of 90% lesion to 0% residual with TIMI3 flow. Impingement on theostium of diag branch dilatedat 6atm resulting in reduction of 90% ostial D2 to <50% residual.   CORONARY ANGIOPLASTY WITH STENT PLACEMENT  04/11/2017   CORONARY ARTERY BYPASS GRAFT N/A  05/17/2021   Procedure: CORONARY ARTERY BYPASS GRAFTING TIMES THREE USING LEFT INTERNAL  MAMMARY ARTERY AND RIGHT SAPHENOUS VEIN HARVESTED ENDOSCOPICALLY;  Surgeon: Corliss Skains, MD;  Location: [facility] OR;  Service: Open Heart Surgery;  Laterality: N/A;   CORONARY ULTRASOUND/IVUS N/A 05/11/2021   Procedure: Intravascular Ultrasound/IVUS;  Surgeon: Runell Gess, MD;  Location: [facility] INVASIVE CV LAB;  Service: Cardiovascular;  Laterality: N/A;   INGUINAL HERNIA REPAIR Right 01/06/2018   Procedure: OPEN RIGHT INGUINAL HERNIA REPAIR WITH MESH;  Surgeon: Darnell Level, MD;  Location: [facility] OR;  Service: General;  Laterality: Right;   INSERTION OF MESH Right 01/06/2018   Procedure: INSERTION OF MESH;  Surgeon: Darnell Level, MD;  Location: [facility] OR;  Service: General;  Laterality: Right;   LAPAROSCOPIC APPENDECTOMY N/A 04/01/2018   Procedure: APPENDECTOMY LAPAROSCOPIC;  Surgeon: Darnell Level, MD;  Location: [facility] ORS;  Service: General;  Laterality: N/A;   LEFT HEART CATH AND CORONARY ANGIOGRAPHY N/A 04/11/2017   Procedure: LEFT HEART CATH AND CORONARY ANGIOGRAPHY;  Surgeon: Runell Gess, MD;  Location: [facility] INVASIVE CV LAB;  Service: Cardiovascular;  Laterality: N/A;   LEFT HEART CATH AND CORONARY ANGIOGRAPHY N/A 05/11/2021   Procedure: LEFT HEART CATH AND CORONARY ANGIOGRAPHY;  Surgeon: Runell Gess, MD;  Location: [facility] INVASIVE CV LAB;  Service: Cardiovascular;  Laterality: N/A;   LIGAMENT REPAIR Right 03/2001   wrist   RENAL ARTERIAL DOPPLER  05/20/2012   SMA and Cephalic artery >60% diameter reduction,  Rt prox Renal artery 60-99% diameter reduction, Lft prox Renal artery 1-59% diameter reduction, kidneys are normal in size.   SHOULDER ARTHROSCOPY WITH SUBACROMIAL DECOMPRESSION Right 04/03/2013   Procedure: RIGHT SHOULDER ARTHROSCOPY WITH SUBACROMIAL DECOMPRESSION/DISTAL CLAVICLE RESECTION;  Surgeon: Senaida Lange, MD;  Location: [facility] OR;  Service: Orthopedics;  Laterality: Right;   TEE WITHOUT  CARDIOVERSION N/A 05/17/2021   Procedure: TRANSESOPHAGEAL ECHOCARDIOGRAM (TEE);  Surgeon: Corliss Skains, MD;  Location: [facility] OR;  Service: Open Heart Surgery;  Laterality: N/A;   TONSILLECTOMY AND ADENOIDECTOMY  ~ 1952   TRANSTHORACIC ECHOCARDIOGRAM  10/06/2007   EF 63%, normal.   WISDOM TOOTH EXTRACTION     age 28    Allergies  Allergen Reactions   Peanuts [Peanut Oil] Anaphylaxis   Lipitor [Atorvastatin] Other (See Comments)    Muscle cramps   Rosuvastatin Other (See Comments)    Muscle cramps if taken daily-pt still takes drug, but takes 3 times a week.    RT great toe 312 2025  Physical Exam: General: The patient is alert and oriented x3 in no acute distress.  Dermatology: Absence of the nail plate noted to the right hallux with underlying nailbed.  There is some scant serous drainage noted and fibrotic tissue to the medial aspect of the nail avulsion site.  Please see above noted photo.  There is no malodor.  No surrounding erythema that is extending beyond the toe that would be concerning for more proximally tracking infection.  Vascular: Palpable pedal pulses bilaterally. Capillary refill within normal limits.  No appreciable edema.  No erythema.  Neurological: Grossly intact via light touch  Musculoskeletal Exam: No pedal deformities noted.  There is associated tenderness around the great toe  Assessment/Plan of Care: 1.  S/P total permanent nail avulsion right hallux nail plate  -Patient evaluated -Cultures taken and sent to pathology for culture and sensitivity.  Patient recently completed a round of oral doxycycline he received from the urgent care.  He completed the antibiotics on 06/02/2023 -Prescription for gentamicin cream apply daily with a Band-Aid -Continue warm water Epsom salt soaks twice daily -Return to clinic 2 weeks       Felecia Shelling, DPM [facility]  Dr. Felecia Shelling, DPM    [address] [place], [place] [address]                Office [phone]  Fax [phone]

## 2023-06-08 LAB — WOUND CULTURE
MICRO NUMBER:: 16192294
SPECIMEN QUALITY:: ADEQUATE

## 2023-06-08 LAB — HOUSE ACCOUNT TRACKING

## 2023-06-08 LAB — DUPLICATE REPORT

## 2023-06-11 NOTE — Telephone Encounter (Signed)
 Spoke with patient via telephone.  Reviewed the culture results.  Patient states that there is improvement of the toe.  He continues to apply the gentamicin cream twice daily.  No additional oral antibiotics sent to the pharmacy.  He has a follow-up appointment on 06/13/2023 with Dr. Charlsie Merles.  He can decide then if he would like to prescribe antibiotics or if there is clinical improvement and no antibiotics are warranted  Felecia Shelling, DPM [facility]  Dr. Felecia Shelling, DPM    [address] [place], [place] [address]                Office [phone]  Fax [phone]

## 2023-06-13 ENCOUNTER — Ambulatory Visit (HOSPITAL_COMMUNITY)
Admission: RE | Admit: 2023-06-13 | Discharge: 2023-06-13 | Disposition: A | Discharge status: Home / Self Care | Payer: Medicare Other | Source: Ambulatory Visit | Attending: Cardiovascular Disease | Admitting: Cardiovascular Disease

## 2023-06-13 ENCOUNTER — Ambulatory Visit (INDEPENDENT_AMBULATORY_CARE_PROVIDER_SITE_OTHER): Admitting: Podiatry

## 2023-06-13 ENCOUNTER — Encounter: Payer: Self-pay | Admitting: Podiatry

## 2023-06-13 DIAGNOSIS — L03031 Cellulitis of right toe: Secondary | ICD-10-CM | POA: Diagnosis not present

## 2023-06-13 DIAGNOSIS — R0989 Other specified symptoms and signs involving the circulatory and respiratory systems: Secondary | ICD-10-CM | POA: Diagnosis not present

## 2023-06-13 MED ORDER — DOXYCYCLINE HYCLATE 100 MG PO TABS: 100.0000 mg | ORAL_TABLET | Freq: Two times a day (BID) | ORAL | 1 refills | Status: DC

## 2023-06-13 NOTE — Progress Notes (Signed)
 Subjective:   Patient ID: Justin Carr, male   DOB: 78 y.o.   MRN: [id number]   HPI Patient presents concerned about some redness and discoloration of his right hallux nailbed after nail removal approximately a month ago and states that it is not sore but needs to be checked   ROS      Objective:  Physical Exam  Neurovascular status intact patient's right hallux nailbed is healing well there still is some proximal erythema around the bed but there is no erythema past this point no proximal edema erythema or drainage noted     Assessment:  Continued low-grade paronychia infection after nail removal     Plan:  H&P reviewed he is on a good topical now and I went ahead and put him back on oral for about a week.  I reviewed with him oral doxycycline what to watch out for and the toe what to watch out for and if it gives him trouble he will be seen back but since it does not hurt and he is improving it should get better uneventfully

## 2023-06-19 ENCOUNTER — Other Ambulatory Visit: Payer: Self-pay

## 2023-06-19 ENCOUNTER — Emergency Department (HOSPITAL_COMMUNITY)

## 2023-06-19 ENCOUNTER — Observation Stay (HOSPITAL_COMMUNITY)

## 2023-06-19 ENCOUNTER — Observation Stay (HOSPITAL_COMMUNITY)
Admission: EM | Admit: 2023-06-19 | Discharge: 2023-06-20 | Disposition: A | Discharge status: Home / Self Care | Attending: Family Medicine | Admitting: Family Medicine

## 2023-06-19 DIAGNOSIS — E785 Hyperlipidemia, unspecified: Secondary | ICD-10-CM | POA: Insufficient documentation

## 2023-06-19 DIAGNOSIS — I639 Cerebral infarction, unspecified: Principal | ICD-10-CM | POA: Diagnosis present

## 2023-06-19 DIAGNOSIS — Z955 Presence of coronary angioplasty implant and graft: Secondary | ICD-10-CM | POA: Insufficient documentation

## 2023-06-19 DIAGNOSIS — Z951 Presence of aortocoronary bypass graft: Secondary | ICD-10-CM | POA: Insufficient documentation

## 2023-06-19 DIAGNOSIS — J341 Cyst and mucocele of nose and nasal sinus: Secondary | ICD-10-CM | POA: Diagnosis not present

## 2023-06-19 DIAGNOSIS — Z87891 Personal history of nicotine dependence: Secondary | ICD-10-CM | POA: Insufficient documentation

## 2023-06-19 DIAGNOSIS — Z7982 Long term (current) use of aspirin: Secondary | ICD-10-CM | POA: Insufficient documentation

## 2023-06-19 DIAGNOSIS — I6389 Other cerebral infarction: Principal | ICD-10-CM | POA: Insufficient documentation

## 2023-06-19 DIAGNOSIS — I2510 Atherosclerotic heart disease of native coronary artery without angina pectoris: Secondary | ICD-10-CM | POA: Diagnosis not present

## 2023-06-19 DIAGNOSIS — R29818 Other symptoms and signs involving the nervous system: Secondary | ICD-10-CM | POA: Diagnosis not present

## 2023-06-19 DIAGNOSIS — Z79899 Other long term (current) drug therapy: Secondary | ICD-10-CM | POA: Insufficient documentation

## 2023-06-19 DIAGNOSIS — R531 Weakness: Secondary | ICD-10-CM | POA: Diagnosis not present

## 2023-06-19 DIAGNOSIS — I10 Essential (primary) hypertension: Secondary | ICD-10-CM | POA: Diagnosis not present

## 2023-06-19 DIAGNOSIS — I6501 Occlusion and stenosis of right vertebral artery: Secondary | ICD-10-CM | POA: Diagnosis not present

## 2023-06-19 DIAGNOSIS — Z91010 Allergy to peanuts: Secondary | ICD-10-CM | POA: Insufficient documentation

## 2023-06-19 DIAGNOSIS — Z7901 Long term (current) use of anticoagulants: Secondary | ICD-10-CM | POA: Diagnosis not present

## 2023-06-19 DIAGNOSIS — E1140 Type 2 diabetes mellitus with diabetic neuropathy, unspecified: Secondary | ICD-10-CM | POA: Diagnosis not present

## 2023-06-19 DIAGNOSIS — I672 Cerebral atherosclerosis: Secondary | ICD-10-CM | POA: Diagnosis not present

## 2023-06-19 DIAGNOSIS — I6523 Occlusion and stenosis of bilateral carotid arteries: Secondary | ICD-10-CM | POA: Diagnosis not present

## 2023-06-19 DIAGNOSIS — R29898 Other symptoms and signs involving the musculoskeletal system: Secondary | ICD-10-CM | POA: Diagnosis not present

## 2023-06-19 DIAGNOSIS — R202 Paresthesia of skin: Secondary | ICD-10-CM | POA: Diagnosis not present

## 2023-06-19 LAB — DIFFERENTIAL
Abs Immature Granulocytes: 0.01 10*3/uL (ref 0.00–0.07)
Basophils Absolute: 0 10*3/uL (ref 0.0–0.1)
Basophils Relative: 1 %
Eosinophils Absolute: 0.2 10*3/uL (ref 0.0–0.5)
Eosinophils Relative: 3 %
Immature Granulocytes: 0 %
Lymphocytes Relative: 29 %
Lymphs Abs: 1.6 10*3/uL (ref 0.7–4.0)
Monocytes Absolute: 0.6 10*3/uL (ref 0.1–1.0)
Monocytes Relative: 11 %
Neutro Abs: 3.2 10*3/uL (ref 1.7–7.7)
Neutrophils Relative %: 56 %

## 2023-06-19 LAB — COMPREHENSIVE METABOLIC PANEL
ALT: 27 U/L (ref 0–44)
AST: 23 U/L (ref 15–41)
Albumin: 3.8 g/dL (ref 3.5–5.0)
Alkaline Phosphatase: 57 U/L (ref 38–126)
Anion gap: 7 (ref 5–15)
BUN: 22 mg/dL (ref 8–23)
CO2: 24 mmol/L (ref 22–32)
Calcium: 9 mg/dL (ref 8.9–10.3)
Chloride: 106 mmol/L (ref 98–111)
Creatinine, Ser: 1 mg/dL (ref 0.61–1.24)
GFR, Estimated: >60 mL/min (ref >60)
Glucose, Bld: 171 mg/dL — ABNORMAL HIGH (ref 70–99)
Potassium: 4.3 mmol/L (ref 3.5–5.1)
Sodium: 137 mmol/L (ref 135–145)
Total Bilirubin: 0.6 mg/dL (ref 0.0–1.2)
Total Protein: 6.4 g/dL — ABNORMAL LOW (ref 6.5–8.1)

## 2023-06-19 LAB — CBC
HCT: 49.2 % (ref 39.0–52.0)
Hemoglobin: 16.3 g/dL (ref 13.0–17.0)
MCH: 33 pg (ref 26.0–34.0)
MCHC: 33.1 g/dL (ref 30.0–36.0)
MCV: 99.6 fL (ref 80.0–100.0)
Platelets: 136 10*3/uL — ABNORMAL LOW (ref 150–400)
RBC: 4.94 MIL/uL (ref 4.22–5.81)
RDW: 13.5 % (ref 11.5–15.5)
WBC: 5.6 10*3/uL (ref 4.0–10.5)
nRBC: 0 % (ref 0.0–0.2)

## 2023-06-19 LAB — ETHANOL: Alcohol, Ethyl (B): <10 mg/dL (ref <10)

## 2023-06-19 LAB — CBG MONITORING, ED
Glucose-Capillary: 177 mg/dL — ABNORMAL HIGH (ref 70–99)
Glucose-Capillary: 161 mg/dL — ABNORMAL HIGH (ref 70–99)

## 2023-06-19 LAB — PROTIME-INR
INR: 1 (ref 0.8–1.2)
Prothrombin Time: 13.7 s (ref 11.4–15.2)

## 2023-06-19 LAB — HEMOGLOBIN A1C
Hgb A1c MFr Bld: 5.9 % — ABNORMAL HIGH (ref 4.8–5.6)
Mean Plasma Glucose: 122.63 mg/dL

## 2023-06-19 LAB — APTT: aPTT: 32 s (ref 24–36)

## 2023-06-19 LAB — GLUCOSE, CAPILLARY: Glucose-Capillary: 82 mg/dL (ref 70–99)

## 2023-06-19 MED ORDER — CLOPIDOGREL BISULFATE 300 MG PO TABS
300.0000 mg | ORAL_TABLET | Freq: Once | ORAL | Status: AC
  Administered 2023-06-19: 300 mg via ORAL
  Filled 2023-06-19: qty 1

## 2023-06-19 MED ORDER — ENOXAPARIN SODIUM 40 MG/0.4ML IJ SOSY: 40.0000 mg | PREFILLED_SYRINGE | INTRAMUSCULAR | Status: DC

## 2023-06-19 MED ORDER — STROKE: EARLY STAGES OF RECOVERY BOOK
Freq: Once | Status: AC
  Filled 2023-06-19: qty 1

## 2023-06-19 MED ORDER — ACETAMINOPHEN 650 MG RE SUPP: 650.0000 mg | RECTAL | Status: DC | PRN

## 2023-06-19 MED ORDER — ACETAMINOPHEN 325 MG PO TABS: 650.0000 mg | ORAL_TABLET | ORAL | Status: DC | PRN

## 2023-06-19 MED ORDER — PREGABALIN 100 MG PO CAPS
100.0000 mg | ORAL_CAPSULE | Freq: Two times a day (BID) | ORAL | Status: DC
  Administered 2023-06-19 – 2023-06-20 (×2): 100 mg via ORAL
  Filled 2023-06-19 (×2): qty 1

## 2023-06-19 MED ORDER — SODIUM CHLORIDE (PF) 0.9 % IJ SOLN
INTRAMUSCULAR | Status: AC
  Filled 2023-06-19: qty 50

## 2023-06-19 MED ORDER — ACETAMINOPHEN 160 MG/5ML PO SOLN: 650.0000 mg | ORAL | Status: DC | PRN

## 2023-06-19 MED ORDER — ROSUVASTATIN CALCIUM 20 MG PO TABS
20.0000 mg | ORAL_TABLET | ORAL | Status: DC
  Administered 2023-06-19: 20 mg via ORAL
  Filled 2023-06-19: qty 1

## 2023-06-19 MED ORDER — ENOXAPARIN SODIUM 40 MG/0.4ML IJ SOSY
40.0000 mg | PREFILLED_SYRINGE | INTRAMUSCULAR | Status: DC
Start: 2023-06-19 — End: 2023-06-20
  Administered 2023-06-19: 40 mg via SUBCUTANEOUS
  Filled 2023-06-19: qty 0.4

## 2023-06-19 MED ORDER — IOHEXOL 350 MG/ML SOLN
75.0000 mL | Freq: Once | INTRAVENOUS | Status: AC | PRN
  Administered 2023-06-19: 75 mL via INTRAVENOUS

## 2023-06-19 MED ORDER — CLOPIDOGREL BISULFATE 75 MG PO TABS
75.0000 mg | ORAL_TABLET | Freq: Every day | ORAL | Status: DC
  Administered 2023-06-20: 75 mg via ORAL
  Filled 2023-06-19: qty 1

## 2023-06-19 MED ORDER — ASPIRIN 81 MG PO TBEC
81.0000 mg | DELAYED_RELEASE_TABLET | Freq: Every day | ORAL | Status: DC
  Administered 2023-06-19 – 2023-06-20 (×2): 81 mg via ORAL
  Filled 2023-06-19 (×2): qty 1

## 2023-06-19 NOTE — Progress Notes (Signed)
 Pt arrived from [facility] ED with c/o of right arm numbness, alert and oriented, denies any discomfort, settled in bed with call light at bedside, admit team and neurologist on call paged and notified of pt's arrival, was however reassured and will continue to monitor, tele monitor put and verified on pt as well. Justin Carr, Justin Carr

## 2023-06-19 NOTE — ED Provider Notes (Signed)
 Received signout from previous provider, please see his note for complete H&P.  78 year old male with history of CAD, hypertension, diabetes, renal artery stenosis, paroxysmal atrial fibrillation not on anticoagulant presenting with stroke symptoms.  Patient endorsed having right hand numbness last known normal was 11:30 PM last night.  It is improving but he still endorsing numbness and weakness.  Workup today is remarkable for an MRI of the brain which shows a left precentral gyrus stroke.  Neurologist Dr. Iver Nestle has been consulted and request patient to be admitted to [facility] on the stroke floor.  I appreciate consultation from [facility] hospitalist, Dr. Austin Justin who agrees to see and will admit patient.  -Labs ordered, independently viewed and interpreted by me.  Labs remarkable for CBG 177 -The patient was maintained on a cardiac monitor.  I personally viewed and interpreted the cardiac monitored which showed an underlying rhythm of: Normal sinus rhythm -Imaging independently viewed and interpreted by me and I agree with radiologist's interpretation.  Result remarkable for CT scan of the head is unremarkable, brain MRI demonstrated a small acute infarct in the left precentral gyrus -This patient presents to the ED for concern of neurodeficit, this involves an extensive number of treatment options, and is a complaint that carries with it a high risk of complications and morbidity.  The differential diagnosis includes stroke, TIA, myositis, electrolyte imbalance, strain, sprain, radiculopathy -Co morbidities that complicate the patient evaluation includes CAD, hypertension, diabetes, paroxysmal A-fib -Treatment includes ASA, Plavix, Lovenox, Tylenol -Reevaluation of the patient after these medicines showed that the patient improved -PCP office notes or outside notes reviewed -Escalation to admission/observation considered: patient will be admitted to [facility]  .Critical Care  Performed by: Fayrene Helper, PA-C Authorized by: Fayrene Helper, PA-C   Critical care provider statement:    Critical care time (minutes):  30   Critical care was time spent personally by me on the following activities:  Development of treatment plan with patient or surrogate, discussions with consultants, evaluation of patient's response to treatment, examination of patient, ordering and review of laboratory studies, ordering and review of radiographic studies, ordering and performing treatments and interventions, pulse oximetry, re-evaluation of patient's condition and review of old charts    Results for orders placed or performed during the hospital encounter of 06/19/23  CBG monitoring, ED   Collection Time: 06/19/23 10:27 AM  Result Value Ref Range   Glucose-Capillary 177 (H) 70 - 99 mg/dL   Comment 1 Notify RN   Protime-INR   Collection Time: 06/19/23 10:36 AM  Result Value Ref Range   Prothrombin Time 13.7 11.4 - 15.2 seconds   INR 1.0 0.8 - 1.2  APTT   Collection Time: 06/19/23 10:36 AM  Result Value Ref Range   aPTT 32 24 - 36 seconds  CBC   Collection Time: 06/19/23 10:36 AM  Result Value Ref Range   WBC 5.6 4.0 - 10.5 K/uL   RBC 4.94 4.22 - 5.81 MIL/uL   Hemoglobin 16.3 13.0 - 17.0 g/dL   HCT 16.1 09.6 - 04.5 %   MCV 99.6 80.0 - 100.0 fL   MCH 33.0 26.0 - 34.0 pg   MCHC 33.1 30.0 - 36.0 g/dL   RDW 40.9 81.1 - 91.4 %   Platelets 136 (L) 150 - 400 K/uL   nRBC 0.0 0.0 - 0.2 %  Differential   Collection Time: 06/19/23 10:36 AM  Result Value Ref Range   Neutrophils Relative % 56 %   Neutro Abs  3.2 1.7 - 7.7 K/uL   Lymphocytes Relative 29 %   Lymphs Abs 1.6 0.7 - 4.0 K/uL   Monocytes Relative 11 %   Monocytes Absolute 0.6 0.1 - 1.0 K/uL   Eosinophils Relative 3 %   Eosinophils Absolute 0.2 0.0 - 0.5 K/uL   Basophils Relative 1 %   Basophils Absolute 0.0 0.0 - 0.1 K/uL   Immature Granulocytes 0 %   Abs Immature Granulocytes 0.01 0.00 - 0.07 K/uL  Comprehensive metabolic panel    Collection Time: 06/19/23 10:36 AM  Result Value Ref Range   Sodium 137 135 - 145 mmol/L   Potassium 4.3 3.5 - 5.1 mmol/L   Chloride 106 98 - 111 mmol/L   CO2 24 22 - 32 mmol/L   Glucose, Bld 171 (H) 70 - 99 mg/dL   BUN 22 8 - 23 mg/dL   Creatinine, Ser 4.09 0.61 - 1.24 mg/dL   Calcium 9.0 8.9 - 81.1 mg/dL   Total Protein 6.4 (L) 6.5 - 8.1 g/dL   Albumin 3.8 3.5 - 5.0 g/dL   AST 23 15 - 41 U/L   ALT 27 0 - 44 U/L   Alkaline Phosphatase 57 38 - 126 U/L   Total Bilirubin 0.6 0.0 - 1.2 mg/dL   GFR, Estimated >91 >47 mL/min   Anion gap 7 5 - 15  Ethanol   Collection Time: 06/19/23 10:36 AM  Result Value Ref Range   Alcohol, Ethyl (B) <10 <10 mg/dL   MR BRAIN WO CONTRAST Result Date: 06/19/2023 CLINICAL DATA:  Neuro deficit, acute, stroke suspected. Right hand weakness beginning yesterday evening. EXAM: MRI HEAD WITHOUT CONTRAST TECHNIQUE: Multiplanar, multiecho pulse sequences of the brain and surrounding structures were obtained without intravenous contrast. COMPARISON:  Head CT 06/19/2023 and MRI 09/05/2019 FINDINGS: Brain: There is a small acute infarct involving the left precentral gyrus in the hand motor region. No intracranial hemorrhage, mass, midline shift, or extra-axial fluid collection is identified. A few punctate T2 hyperintensities in the cerebral white matter are within normal limits for age. Cerebral volume is normal with normal size of the ventricles. Vascular: Major intracranial vascular flow voids are preserved. Skull and upper cervical spine: Unremarkable bone marrow signal. Sinuses/Orbits: Bilateral cataract extraction. Mucous retention cyst in the left maxillary sinus. Clear mastoid air cells. Other: None. IMPRESSION: Small acute infarct in the left precentral gyrus. Electronically Signed   By: Sebastian Ache M.D.   On: 06/19/2023 15:01   CT HEAD WO CONTRAST Result Date: 06/19/2023 CLINICAL DATA:  Right hand weakness. EXAM: CT HEAD WITHOUT CONTRAST TECHNIQUE: Contiguous  axial images were obtained from the base of the skull through the vertex without intravenous contrast. RADIATION DOSE REDUCTION: This exam was performed according to the departmental dose-optimization program which includes automated exposure control, adjustment of the mA and/or kV according to patient size and/or use of iterative reconstruction technique. COMPARISON:  July 04, 2007 FINDINGS: Brain: No evidence of acute infarction, hemorrhage, hydrocephalus, extra-axial collection or mass lesion/mass effect. Vascular: Moderate to marked severity bilateral cavernous carotid artery calcification is noted. Skull: Normal. Negative for fracture or focal lesion. Sinuses/Orbits: No acute finding. Other: None. IMPRESSION: No acute intracranial abnormality. Electronically Signed   By: Aram Candela M.D.   On: 06/19/2023 11:47   VAS US CAROTID Result Date: 06/13/2023 Carotid Arterial Duplex Study Patient Name:  Justin Carr  Date of Exam:   06/13/2023 Medical Rec #: [id number]     Accession #:    [id number] Date of Birth:  07/01/1945     Patient Gender: M Patient Age:   78 years Exam Location:  [facility] Procedure:      VAS US CAROTID Referring Phys: Charlesetta Garibaldi WEST --------------------------------------------------------------------------------  Indications:      Bilateral bruits. Risk Factors:     Hypertension, hyperlipidemia, Diabetes, coronary artery                   disease. Comparison Study: 04/11/2021 Carotid artery duplex- bilateral 1-39% ICA                   stenosis. Performing Technologist: Gertie Fey MHA, RDMS, RVT, RDCS  Examination Guidelines: A complete evaluation includes B-mode imaging, spectral Doppler, color Doppler, and power Doppler as needed of all accessible portions of each vessel. Bilateral testing is considered an integral part of a complete examination. Limited examinations for reoccurring indications may be performed as noted.  Right Carotid Findings:  +----------+--------+--------+--------+--------------------------+--------+           PSV cm/sEDV cm/sStenosisPlaque Description        Comments +----------+--------+--------+--------+--------------------------+--------+ CCA Prox  147     20                                                 +----------+--------+--------+--------+--------------------------+--------+ CCA Distal107     13                                                 +----------+--------+--------+--------+--------------------------+--------+ ICA Prox  143     20              heterogenous and irregular         +----------+--------+--------+--------+--------------------------+--------+ ICA Distal70      24                                                 +----------+--------+--------+--------+--------------------------+--------+ ECA       161     10              heterogenous and irregular         +----------+--------+--------+--------+--------------------------+--------+ +----------+--------+-------+--------+-------------------+           PSV cm/sEDV cmsDescribeArm Pressure (mmHG) +----------+--------+-------+--------+-------------------+ Subclavian330     0      Stenotic127                 +----------+--------+-------+--------+-------------------+ +---------+--------+--+--------+-+---------+ VertebralPSV cm/s35EDV cm/s9Antegrade +---------+--------+--+--------+-+---------+  Left Carotid Findings: +----------+--------+--------+--------+--------------------------+--------+           PSV cm/sEDV cm/sStenosisPlaque Description        Comments +----------+--------+--------+--------+--------------------------+--------+ CCA Prox  131     16                                                 +----------+--------+--------+--------+--------------------------+--------+ CCA Distal122     19              smooth and heterogenous             +----------+--------+--------+--------+--------------------------+--------+ ICA Prox  189     20              heterogenous and irregular         +----------+--------+--------+--------+--------------------------+--------+ ICA Distal75      17                                                 +----------+--------+--------+--------+--------------------------+--------+ ECA       180     0               heterogenous and irregular         +----------+--------+--------+--------+--------------------------+--------+ +----------+--------+--------+----------------+-------------------+           PSV cm/sEDV cm/sDescribe        Arm Pressure (mmHG) +----------+--------+--------+----------------+-------------------+ [id number]     0       Multiphasic, [id number]                 +----------+--------+--------+----------------+-------------------+ +---------+--------+--+--------+-+---------+ VertebralPSV cm/s47EDV cm/s9Antegrade +---------+--------+--+--------+-+---------+   Summary: Right Carotid: Velocities in the right ICA are consistent with a 1-39% stenosis. Left Carotid: Velocities in the left ICA are consistent with a 1-39% stenosis. Vertebrals:  Bilateral vertebral arteries demonstrate antegrade flow. Subclavians: Right subclavian artery was stenotic. Normal flow hemodynamics were              seen in the left subclavian artery. *See table(s) above for measurements and observations.  Electronically signed by Charlton Haws MD on 06/13/2023 at 2:25:51 PM.    Final    MYOCARDIAL PERFUSION IMAGING Result Date: 05/28/2023   Findings are consistent with no ischemia and no infarction on perfusion images. The study is intermediate risk due to presence of transient ischemic dilation, cannot exclude balanced ischemia. If symptoms persist, consider alternate ischemic evaluation (PET CT with MBFR vs cath).   No ST deviation was noted.   LV perfusion is normal. There is no evidence of ischemia. There  is no evidence of infarction.   Left ventricular function is normal. Nuclear stress EF: 59%. The left ventricular ejection fraction is normal (55-65%). End diastolic cavity size is normal. End systolic cavity size is normal. Evidence of transient ischemic dilation (TID) noted. TID was appreciated visually and quantitatively.   Prior study available for comparison from 04/04/2017. There are changes in perfusion compared to prior study which appear to be improved.      Fayrene Helper, PA-C 06/19/23 1549    [place], Adam, DO 06/19/23 1630

## 2023-06-19 NOTE — ED Triage Notes (Signed)
 Pt noticed weakness to right hand last night around 11p when trying to wipe the dog's paws. Grip strength slightly weaker. Denies vision changes, pain, no hx cva. PERRLA

## 2023-06-19 NOTE — H&P (Addendum)
 History and Physical    Patient: Justin Carr ZOX:[id number] DOB: [birth year] DOA: 06/19/2023 DOS: the patient was seen and examined on 06/19/2023 PCP: Noberto Retort, MD  Patient coming from: Home  Chief Complaint:  Chief Complaint  Patient presents with   Numbness   HPI: Justin Carr is a 78 y.o. male with medical history significant of T2DM with neuropathy, HTN, HLD, OSA, CAD s/p CABG x3, renal artery stenosis presenting to the ED with right hand numbness.  Patient reports that he took his dog for a walk around 11pm yesterday evening. When he returned home, he noticed having difficulty with letting his dog off his leash and also noted difficulty with writing with his right hand. States that his right arm felt different than his left mainly in regards to sensation. States that right hand feels a little better today but still not at his usual baseline. Denies any facial droop, slurred speech, dysarthria/aphasia, weakness in any extremity, imbalance with ambulation. Denies any previous history of stroke.   ED course: Vital signs stable. CBC with thrombocytopenia, otherwise unremarkable. CMP with hyperglycemia to 171, otherwise unremarkable. PT/INR/APTT within normal limits. Ethanol level negative. CT head negative. MRI brain showing small acute infarct in the left precentral gyrus. ED provider consulted neurology, Dr. Iver Nestle, who recommended obtaining CTA head/neck and admission for stroke workup. [facility] hospitalist asked to evaluate patient for admission.  Review of Systems: As mentioned in the history of present illness. All other systems reviewed and are negative. Past Medical History:  Diagnosis Date   Coronary artery disease    GERD (gastroesophageal reflux disease)    Hyperlipidemia    Hypertension    NSTEMI (non-ST elevated myocardial infarction) (HCC) 06/2007   [name] 07/27/2010   OSA on CPAP    Palpitations    Pneumonia ~ 1971 X 1   Type II diabetes mellitus (HCC)    Past  Surgical History:  Procedure Laterality Date   APPENDECTOMY     CARDIOVASCULAR STRESS TEST  10/06/2007   No scintigraphic evidence of inducible myocardial ischemia. ECG positive for ischemia. Pharmacologically induced ST segment depression. Perfusion defect seen in inferior myocardial region consistent with diaphragmatic attenuation.   CORONARY ANGIOPLASTY WITH STENT PLACEMENT  07/03/2007   LAD 90% ulcerated plaque proximal between 1st and 2nd branches stented with a 2.75x10mm Taxus Liberte stent deployed at 16atm. Postdilated at 16atm (3.98mm). resulting in reduction of 90% lesion to 0% residual with TIMI3 flow. Impingement on theostium of diag branch dilatedat 6atm resulting in reduction of 90% ostial D2 to <50% residual.   CORONARY ANGIOPLASTY WITH STENT PLACEMENT  04/11/2017   CORONARY ARTERY BYPASS GRAFT N/A 05/17/2021   Procedure: CORONARY ARTERY BYPASS GRAFTING TIMES THREE USING LEFT INTERNAL  MAMMARY ARTERY AND RIGHT SAPHENOUS VEIN HARVESTED ENDOSCOPICALLY;  Surgeon: Corliss Skains, MD;  Location: [facility] OR;  Service: Open Heart Surgery;  Laterality: N/A;   CORONARY ULTRASOUND/IVUS N/A 05/11/2021   Procedure: Intravascular Ultrasound/IVUS;  Surgeon: Runell Gess, MD;  Location: [facility] INVASIVE CV LAB;  Service: Cardiovascular;  Laterality: N/A;   INGUINAL HERNIA REPAIR Right 01/06/2018   Procedure: OPEN RIGHT INGUINAL HERNIA REPAIR WITH MESH;  Surgeon: Darnell Level, MD;  Location: [facility] OR;  Service: General;  Laterality: Right;   INSERTION OF MESH Right 01/06/2018   Procedure: INSERTION OF MESH;  Surgeon: Darnell Level, MD;  Location: [facility] OR;  Service: General;  Laterality: Right;   LAPAROSCOPIC APPENDECTOMY N/A 04/01/2018   Procedure: APPENDECTOMY LAPAROSCOPIC;  Surgeon: Darnell Level, MD;  Location: [facility] ORS;  Service: General;  Laterality: N/A;   LEFT HEART CATH AND CORONARY ANGIOGRAPHY N/A 04/11/2017   Procedure: LEFT HEART CATH AND CORONARY ANGIOGRAPHY;  Surgeon: Runell Gess, MD;   Location: [facility] INVASIVE CV LAB;  Service: Cardiovascular;  Laterality: N/A;   LEFT HEART CATH AND CORONARY ANGIOGRAPHY N/A 05/11/2021   Procedure: LEFT HEART CATH AND CORONARY ANGIOGRAPHY;  Surgeon: Runell Gess, MD;  Location: [facility] INVASIVE CV LAB;  Service: Cardiovascular;  Laterality: N/A;   LIGAMENT REPAIR Right 03/2001   wrist   RENAL ARTERIAL DOPPLER  05/20/2012   SMA and Cephalic artery >60% diameter reduction, Rt prox Renal artery 60-99% diameter reduction, Lft prox Renal artery 1-59% diameter reduction, kidneys are normal in size.   SHOULDER ARTHROSCOPY WITH SUBACROMIAL DECOMPRESSION Right 04/03/2013   Procedure: RIGHT SHOULDER ARTHROSCOPY WITH SUBACROMIAL DECOMPRESSION/DISTAL CLAVICLE RESECTION;  Surgeon: Senaida Lange, MD;  Location: [facility] OR;  Service: Orthopedics;  Laterality: Right;   TEE WITHOUT CARDIOVERSION N/A 05/17/2021   Procedure: TRANSESOPHAGEAL ECHOCARDIOGRAM (TEE);  Surgeon: Corliss Skains, MD;  Location: [facility] OR;  Service: Open Heart Surgery;  Laterality: N/A;   TONSILLECTOMY AND ADENOIDECTOMY  ~ 1952   TRANSTHORACIC ECHOCARDIOGRAM  10/06/2007   EF 63%, normal.   WISDOM TOOTH EXTRACTION     age 41   Social History:  reports that he quit smoking about 15 years ago. His smoking use included cigarettes. He started smoking about 49 years ago. He has a 34 pack-year smoking history. He has never used smokeless tobacco. He reports current alcohol use. He reports that he does not use drugs.  Allergies  Allergen Reactions   Peanuts [Peanut Oil] Anaphylaxis   Lipitor [Atorvastatin] Other (See Comments)    Muscle cramps   Rosuvastatin Other (See Comments)    Muscle cramps if taken daily-pt still takes drug, but takes 3 times a week.    Family History  Problem Relation Age of Onset   Heart attack Maternal Grandfather    Parkinson's disease Paternal Grandfather    Heart disease Mother    Arrhythmia Father    Dementia Father     Prior to Admission medications    Medication Sig Start Date End Date Taking? Authorizing Provider  Alpha-Lipoic Acid 300 MG CAPS Take 300 mg by mouth in the morning and at bedtime.    [provider]  aspirin 81 MG tablet Take 1 tablet (81 mg total) by mouth daily. Patient taking differently: Take 81 mg by mouth at bedtime. 04/12/17   Bhagat, Sharrell Ku, PA  Coenzyme Q10 (COQ-10 PO) Take 1 capsule by mouth at bedtime.     [provider]  dapagliflozin propanediol (FARXIGA) 5 MG TABS tablet Take 5 mg by mouth daily.    [provider]  doxycycline (VIBRA-TABS) 100 MG tablet Take 1 tablet (100 mg total) by mouth 2 (two) times daily. 06/13/23   Lenn Sink, DPM  gentamicin cream (GARAMYCIN) 0.1 % Apply 1 Application topically 2 (two) times daily. 06/05/23   Felecia Shelling, DPM  metoprolol tartrate (LOPRESSOR) 25 MG tablet Take 1/2 (one-half) tablet by mouth twice daily 05/06/23   Runell Gess, MD  Multiple Vitamin (MULTIVITAMIN WITH MINERALS) TABS tablet Take 1 tablet by mouth daily.    [provider]  Multiple Vitamins-Minerals (PRESERVISION AREDS PO) Take 1 capsule by mouth 2 (two) times daily.    [provider]  nitroGLYCERIN (NITROSTAT) 0.4 MG SL tablet DISSOLVE ONE TABLET UNDER THE TONGUE EVERY 5 MINUTES  AS NEEDED FOR CHEST PAIN.  DO NOT EXCEED A TOTAL OF 3 DOSES IN 15 MINUTES 11/28/20   Runell Gess, MD  pregabalin (LYRICA) 100 MG capsule Take 100 mg by mouth 2 (two) times daily. 05/12/19   [provider]  ramipril (ALTACE) 10 MG capsule Take 1 capsule by mouth once daily 04/22/23   Runell Gess, MD  rosuvastatin (CRESTOR) 20 MG tablet TAKE 1 TABLET BY MOUTH THREE TIMES A WEEK 03/18/23   Runell Gess, MD  sildenafil (VIAGRA) 100 MG tablet Take 100 mg by mouth daily as needed for erectile dysfunction.    [provider]  TRULICITY 1.5 MG/0.5ML SOAJ Inject into the skin. 05/25/23   [provider]    Physical Exam: Vitals:   06/19/23  1021 06/19/23 1027 06/19/23 1343  BP: (!) 124/59  133/71  Pulse: 61  (!) 56  Resp: 16  16  Temp: 97.7 F (36.5 C)  98 F (36.7 C)  TempSrc: Oral  Oral  SpO2: 98%  99%  Weight:  97.1 kg   Height:  5\' 11"  (1.803 m)    Physical Exam Constitutional:      Appearance: Normal appearance. He is not ill-appearing.  HENT:     Head: Normocephalic and atraumatic.     Mouth/Throat:     Mouth: Mucous membranes are moist.     Pharynx: Oropharynx is clear. No oropharyngeal exudate.  Eyes:     General: No scleral icterus.    Extraocular Movements: Extraocular movements intact.     Pupils: Pupils are equal, round, and reactive to light.  Cardiovascular:     Rate and Rhythm: Normal rate and regular rhythm.     Heart sounds: Normal heart sounds. No murmur heard.    No friction rub. No gallop.  Pulmonary:     Effort: Pulmonary effort is normal. No respiratory distress.     Breath sounds: Normal breath sounds. No wheezing, rhonchi or rales.  Abdominal:     General: Bowel sounds are normal. There is no distension.     Palpations: Abdomen is soft.     Tenderness: There is no abdominal tenderness. There is no guarding or rebound.  Musculoskeletal:        General: No swelling. Normal range of motion.     Cervical back: Normal range of motion.  Skin:    General: Skin is warm and dry.  Neurological:     Mental Status: He is alert and oriented to person, place, and time.     Comments: Alert and oriented x 3.  No dysarthria or aphasia noted.  Fluent conversation.  PERRL, EOMI, no nystagmus noted.  Visual fields full bilaterally.  Facial sensation intact bilaterally.  Facial motor skills intact bilaterally.  Uvula midline.  Shoulder shrug intact bilaterally.  Tongue protrusion midline.  Gross motor strength 5 out of 5 in all extremities.  Fine motor strength 4/5 in right hand, 5/5 in left hand.  Sensation intact bilaterally in upper and lower extremities.  No dysdiadochokinesia noted.  FNF testing intact  bilaterally.  Gait testing deferred.  Psychiatric:        Mood and Affect: Mood normal.        Behavior: Behavior normal.     Data Reviewed:  There are no new results to review at this time.    Latest Ref Rng & Units 06/19/2023   10:36 AM 06/13/2021    8:56 AM 05/21/2021    2:15 AM  CBC  WBC  4.0 - 10.5 K/uL 5.6  8.6  7.3   Hemoglobin 13.0 - 17.0 g/dL 24.4  01.0  27.2   Hematocrit 39.0 - 52.0 % 49.2  36.1  32.0   Platelets 150 - 400 K/uL 136  253  166       Latest Ref Rng & Units 06/19/2023   10:36 AM 06/19/2021    3:08 PM 06/14/2021   12:46 PM  CMP  Glucose 70 - 99 mg/dL 536  644  69   BUN 8 - 23 mg/dL 22  12  16    Creatinine 0.61 - 1.24 mg/dL 0.34  7.42  5.95   Sodium 135 - 145 mmol/L 137  139  141   Potassium 3.5 - 5.1 mmol/L 4.3  5.2  5.7   Chloride 98 - 111 mmol/L 106  100  100   CO2 22 - 32 mmol/L 24  26  25    Calcium 8.9 - 10.3 mg/dL 9.0  9.2  9.2   Total Protein 6.5 - 8.1 g/dL 6.4     Total Bilirubin 0.0 - 1.2 mg/dL 0.6     Alkaline Phos 38 - 126 U/L 57     AST 15 - 41 U/L 23     ALT 0 - 44 U/L 27      Alcohol Level    Component Value Date/Time   ETH <10 06/19/2023 1036   MR BRAIN WO CONTRAST Result Date: 06/19/2023 CLINICAL DATA:  Neuro deficit, acute, stroke suspected. Right hand weakness beginning yesterday evening. EXAM: MRI HEAD WITHOUT CONTRAST TECHNIQUE: Multiplanar, multiecho pulse sequences of the brain and surrounding structures were obtained without intravenous contrast. COMPARISON:  Head CT 06/19/2023 and MRI 09/05/2019 FINDINGS: Brain: There is a small acute infarct involving the left precentral gyrus in the hand motor region. No intracranial hemorrhage, mass, midline shift, or extra-axial fluid collection is identified. A few punctate T2 hyperintensities in the cerebral white matter are within normal limits for age. Cerebral volume is normal with normal size of the ventricles. Vascular: Major intracranial vascular flow voids are preserved. Skull and upper  cervical spine: Unremarkable bone marrow signal. Sinuses/Orbits: Bilateral cataract extraction. Mucous retention cyst in the left maxillary sinus. Clear mastoid air cells. Other: None. IMPRESSION: Small acute infarct in the left precentral gyrus. Electronically Signed   By: Sebastian Ache M.D.   On: 06/19/2023 15:01   CT HEAD WO CONTRAST Result Date: 06/19/2023 CLINICAL DATA:  Right hand weakness. EXAM: CT HEAD WITHOUT CONTRAST TECHNIQUE: Contiguous axial images were obtained from the base of the skull through the vertex without intravenous contrast. RADIATION DOSE REDUCTION: This exam was performed according to the departmental dose-optimization program which includes automated exposure control, adjustment of the mA and/or kV according to patient size and/or use of iterative reconstruction technique. COMPARISON:  July 04, 2007 FINDINGS: Brain: No evidence of acute infarction, hemorrhage, hydrocephalus, extra-axial collection or mass lesion/mass effect. Vascular: Moderate to marked severity bilateral cavernous carotid artery calcification is noted. Skull: Normal. Negative for fracture or focal lesion. Sinuses/Orbits: No acute finding. Other: None. IMPRESSION: No acute intracranial abnormality. Electronically Signed   By: Aram Candela M.D.   On: 06/19/2023 11:47     Assessment and Plan: No notes have been filed under this hospital service. Service: Hospitalist  Acute small left precentral gyrus infarct Patient presenting with mild right hand fine motor weakness that began around 11:30 PM on 06/18/2023.  No indication for IV thrombolysis given he is outside of window and almost completely back  to baseline.  He has very mild residual fine motor weakness in his right hand on physical exam.  Neurology, Dr. Iver Nestle, consulted by ED provider.  Neurology has recommended obtaining CTA head and neck and admission to [facility] for stroke workup.  Given acute CVA on aspirin monotherapy, may need to consider  transition to Plavix monotherapy at discharge but will defer to neurology. -Neurology consulted, appreciate assistance -Follow-up CTA head and neck -Follow-up echocardiogram -Resume home aspirin 81 mg daily -Plavix 300 mg once, then Plavix 75 mg daily from tomorrow -Passed bedside swallow, started heart healthy/carb modified diet -Follow-up hemoglobin A1c, lipid panel tomorrow morning -Resume home Crestor -Telemetry -PT/OT eval -lovenox for dvt ppx -Place in observation/telemetry at [facility] per neurology  Hypertension Patient taking ramipril 10 mg daily, metoprolol tartrate 12.5 mg twice daily at home.  Holding antihypertensives to allow for permissive hypertension for the next 24 to 48 hours.  Blood pressures ranging in the 120s-140s/60s-70s. -Permissive hypertension with goal BP <220/110 for next 24 to 48 hours -Holding home antihypertensives  Type 2 diabetes with neuropathy Patient taking Farxiga 5 mg daily and Trulicity 1.5 mg weekly.  Random blood glucose 171 on CMP today.  No recent A1c on file, ordered on admission. -Follow-up hemoglobin A1c -Trend CBGs, goal 140-180 -Holding home [place] and Trulicity -Consider adding SSI if CBGs above goal -Resume home Lyrica 100 mg twice daily  CAD status post CABG x 3 Hyperlipidemia Patient reports that that he had a previous allergy to Crestor when taking daily.  Discussed with cardiology, who recommended taking Crestor every Monday, Wednesday, Friday.  Has been tolerating this dosing well. -Resume home Crestor 20 mg every Monday, Wednesday, Friday -Resume home aspirin as noted above -Follow-up lipid panel tomorrow morning    Advance Care Planning:   Code Status: Full Code   Consults: neurology  Family Communication: no family at bedside  Severity of Illness: The appropriate patient status for this patient is OBSERVATION. Observation status is judged to be reasonable and necessary in order to provide the required intensity of  service to ensure the patient's safety. The patient's presenting symptoms, physical exam findings, and initial radiographic and laboratory data in the context of their medical condition is felt to place them at decreased risk for further clinical deterioration. Furthermore, it is anticipated that the patient will be medically stable for discharge from the hospital within 2 midnights of admission.   Portions of this note were generated with Scientist, clinical (histocompatibility and immunogenetics). Dictation errors may occur despite best attempts at proofreading.  Author: Briscoe Burns, MD 06/19/2023 4:02 PM  For on call review [email or web address].

## 2023-06-19 NOTE — Progress Notes (Signed)
 Patient has history of OSA requesting for CPAP at bedtime. Ordering CPAP nightly.  Tereasa Coop, MD [facility] 06/19/2023, 9:52 PM

## 2023-06-19 NOTE — ED Provider Notes (Signed)
 [place] EMERGENCY DEPARTMENT AT [facility] Provider Note   CSN: [id number] Arrival date & time: 06/19/23  1014     History  Chief Complaint  Patient presents with   Numbness    Justin Carr is a 78 y.o. male.  Patient with history of hypertension, hyperlipidemia, diabetes, known coronary artery disease with previous stenting and open heart surgery --presents to the emergency department today for evaluation of right hand numbness.  Patient states that he was taking his dog out around 11 PM last night.  When he returned, he was having difficulty with the fine motor skills of his hand and having difficulty letting his dog off the leash.  Patient has been having difficulty writing since that time.  He did notice some swelling in his hands last evening.  His right arm in general felt different than the left side.  Symptoms have improved a bit since last night, but are not resolved. Patient denies signs of stroke including: facial droop, slurred speech, aphasia, weakness/numbness in extremities, imbalance/trouble walking.  Recent carotid Dopplers with mild disease bilaterally.      Home Medications Prior to Admission medications   Medication Sig Start Date End Date Taking? Authorizing Provider  Alpha-Lipoic Acid 300 MG CAPS Take 300 mg by mouth in the morning and at bedtime.    [provider]  aspirin 81 MG tablet Take 1 tablet (81 mg total) by mouth daily. Patient taking differently: Take 81 mg by mouth at bedtime. 04/12/17   Bhagat, Sharrell Ku, PA  Coenzyme Q10 (COQ-10 PO) Take 1 capsule by mouth at bedtime.     [provider]  dapagliflozin propanediol (FARXIGA) 5 MG TABS tablet Take 5 mg by mouth daily.    [provider]  doxycycline (VIBRA-TABS) 100 MG tablet Take 1 tablet (100 mg total) by mouth 2 (two) times daily. 06/13/23   Lenn Sink, DPM  gentamicin cream (GARAMYCIN) 0.1 % Apply 1 Application topically 2 (two) times daily. 06/05/23    Felecia Shelling, DPM  metoprolol tartrate (LOPRESSOR) 25 MG tablet Take 1/2 (one-half) tablet by mouth twice daily 05/06/23   Runell Gess, MD  Multiple Vitamin (MULTIVITAMIN WITH MINERALS) TABS tablet Take 1 tablet by mouth daily.    [provider]  Multiple Vitamins-Minerals (PRESERVISION AREDS PO) Take 1 capsule by mouth 2 (two) times daily.    [provider]  nitroGLYCERIN (NITROSTAT) 0.4 MG SL tablet DISSOLVE ONE TABLET UNDER THE TONGUE EVERY 5 MINUTES AS NEEDED FOR CHEST PAIN.  DO NOT EXCEED A TOTAL OF 3 DOSES IN 15 MINUTES 11/28/20   Runell Gess, MD  pregabalin (LYRICA) 100 MG capsule Take 100 mg by mouth 2 (two) times daily. 05/12/19   [provider]  ramipril (ALTACE) 10 MG capsule Take 1 capsule by mouth once daily 04/22/23   Runell Gess, MD  rosuvastatin (CRESTOR) 20 MG tablet TAKE 1 TABLET BY MOUTH THREE TIMES A WEEK 03/18/23   Runell Gess, MD  sildenafil (VIAGRA) 100 MG tablet Take 100 mg by mouth daily as needed for erectile dysfunction.    [provider]  TRULICITY 1.5 MG/0.5ML SOAJ Inject into the skin. 05/25/23   [provider]      Allergies    Peanuts [peanut oil], Lipitor [atorvastatin], and Rosuvastatin    Review of Systems   Review of Systems  Physical Exam Updated Vital Signs BP (!) 124/59 (BP Location: Left Arm)   Pulse 61   Temp  97.7 F (36.5 C) (Oral)   Resp 16   Ht 5\' 11"  (1.803 m)   Wt 97.1 kg   SpO2 98%   BMI 29.85 kg/m   Physical Exam Vitals and nursing note reviewed.  Constitutional:      Appearance: He is well-developed.  HENT:     Head: Normocephalic and atraumatic.     Right Ear: External ear normal.     Left Ear: External ear normal.     Nose: Nose normal.     Mouth/Throat:     Pharynx: Uvula midline.  Eyes:     General: Lids are normal.     Conjunctiva/sclera: Conjunctivae normal.     Pupils: Pupils are equal, round, and reactive to light.  Cardiovascular:     Rate and  Rhythm: Normal rate and regular rhythm.  Pulmonary:     Effort: Pulmonary effort is normal.     Breath sounds: Normal breath sounds.  Abdominal:     Palpations: Abdomen is soft.     Tenderness: There is no abdominal tenderness.  Musculoskeletal:        General: Normal range of motion.     Cervical back: Normal range of motion and neck supple. No tenderness or bony tenderness.  Skin:    General: Skin is warm and dry.  Neurological:     Mental Status: He is alert and oriented to person, place, and time.     GCS: GCS eye subscore is 4. GCS verbal subscore is 5. GCS motor subscore is 6.     Cranial Nerves: No cranial nerve deficit.     Sensory: No sensory deficit.     Motor: Weakness present. No abnormal muscle tone.     Coordination: Coordination normal.     Gait: Gait normal.     Comments: Trace weakness R grip, otherwise normal strength grip on the left, normal flexion extension of the wrist, elbow bilaterally, normal strength and range of motion in the shoulders bilaterally.  Lower extremities, normal grip strength bilaterally.    ED Results / Procedures / Treatments   Labs (all labs ordered are listed, but only abnormal results are displayed) Labs Reviewed  CBC - Abnormal; Notable for the following components:      Result Value   Platelets 136 (*)    All other components within normal limits  COMPREHENSIVE METABOLIC PANEL - Abnormal; Notable for the following components:   Glucose, Bld 171 (*)    Total Protein 6.4 (*)    All other components within normal limits  CBG MONITORING, ED - Abnormal; Notable for the following components:   Glucose-Capillary 177 (*)    All other components within normal limits  PROTIME-INR  APTT  DIFFERENTIAL  ETHANOL  I-STAT CHEM 8, ED  CBG MONITORING, ED    ED ECG REPORT   Date: 06/19/2023  Rate: 61  Rhythm: normal sinus rhythm  QRS Axis: normal  Intervals: PR prolonged  ST/T Wave abnormalities: normal  Conduction  Disutrbances:first-degree A-V block   Narrative Interpretation:   Old EKG Reviewed: unchanged from 04/2023  I have personally reviewed the EKG tracing and agree with the computerized printout as noted.   Radiology CT HEAD WO CONTRAST Result Date: 06/19/2023 CLINICAL DATA:  Right hand weakness. EXAM: CT HEAD WITHOUT CONTRAST TECHNIQUE: Contiguous axial images were obtained from the base of the skull through the vertex without intravenous contrast. RADIATION DOSE REDUCTION: This exam was performed according to the departmental dose-optimization program which includes automated exposure control, adjustment  of the mA and/or kV according to patient size and/or use of iterative reconstruction technique. COMPARISON:  July 04, 2007 FINDINGS: Brain: No evidence of acute infarction, hemorrhage, hydrocephalus, extra-axial collection or mass lesion/mass effect. Vascular: Moderate to marked severity bilateral cavernous carotid artery calcification is noted. Skull: Normal. Negative for fracture or focal lesion. Sinuses/Orbits: No acute finding. Other: None. IMPRESSION: No acute intracranial abnormality. Electronically Signed   By: Aram Candela M.D.   On: 06/19/2023 11:47    Procedures Procedures    Medications Ordered in ED Medications - No data to display  ED Course/ Medical Decision Making/ A&P    Patient seen and examined. History obtained directly from patient. Work-up including labs, imaging, EKG ordered in triage, if performed, were reviewed.    Labs/EKG: Independently reviewed and interpreted.  This included: CBC with slightly low platelets at 136 otherwise unremarkable; CMP with glucose elevated at 171, normal kidney function and electrolytes otherwise unremarkable; ethanol negative; PT/INR and APTT ordered as part of the stroke order set were normal.  Imaging: Independently visualized and interpreted.  This included: CT head, agree negative for acute stroke.  Report notes moderate to marked  severity bilateral cavernous carotid artery calcification.  Medications/Fluids: None ordered  Most recent vital signs reviewed and are as follows: BP (!) 124/59 (BP Location: Left Arm)   Pulse 61   Temp 97.7 F (36.5 C) (Oral)   Resp 16   Ht 5\' 11"  (1.803 m)   Wt 97.1 kg   SpO2 98%   BMI 29.85 kg/m   Initial impression: Right hand weakness, unclear if this is a peripheral or central neurologic problem.  Advised MRI to evaluate for possibility of stroke.  2:04 PM Reviewed MRI, appears positive, awaiting radiology report.   3:26 PM MRI confirms acute stroke.  Patient discussed with Dr. Iver Nestle with neuro. Requests CTA head/neck.  Will plan for admission for stroke workup.  Patient updated on results and plan.  Agreement with admission.                                 Medical Decision Making Amount and/or Complexity of Data Reviewed Labs: ordered. Radiology: ordered.   Admit for acute stroke        Final Clinical Impression(s) / ED Diagnoses Final diagnoses:  Acute stroke due to ischemia [facility])    Rx / DC Orders ED Discharge Orders     None         Renne Crigler, PA-C 06/19/23 1529    Alvira Monday, MD 06/20/23 [phone]

## 2023-06-19 NOTE — ED Notes (Signed)
[facility] has been called. 

## 2023-06-20 ENCOUNTER — Observation Stay (HOSPITAL_BASED_OUTPATIENT_CLINIC_OR_DEPARTMENT_OTHER)

## 2023-06-20 ENCOUNTER — Other Ambulatory Visit (HOSPITAL_COMMUNITY): Payer: Self-pay

## 2023-06-20 ENCOUNTER — Encounter (HOSPITAL_COMMUNITY): Payer: Self-pay | Admitting: Student

## 2023-06-20 ENCOUNTER — Telehealth: Payer: Self-pay

## 2023-06-20 DIAGNOSIS — I639 Cerebral infarction, unspecified: Secondary | ICD-10-CM | POA: Diagnosis not present

## 2023-06-20 DIAGNOSIS — I63412 Cerebral infarction due to embolism of left middle cerebral artery: Secondary | ICD-10-CM | POA: Diagnosis not present

## 2023-06-20 DIAGNOSIS — I6389 Other cerebral infarction: Secondary | ICD-10-CM | POA: Diagnosis not present

## 2023-06-20 LAB — GLUCOSE, CAPILLARY
Glucose-Capillary: 104 mg/dL — ABNORMAL HIGH (ref 70–99)
Glucose-Capillary: 137 mg/dL — ABNORMAL HIGH (ref 70–99)

## 2023-06-20 LAB — LIPID PANEL
Cholesterol: 134 mg/dL (ref 0–200)
HDL: 36 mg/dL — ABNORMAL LOW (ref >40)
LDL Cholesterol: 61 mg/dL (ref 0–99)
Total CHOL/HDL Ratio: 3.7 ratio
Triglycerides: 186 mg/dL — ABNORMAL HIGH (ref <150)
VLDL: 37 mg/dL (ref 0–40)

## 2023-06-20 LAB — BASIC METABOLIC PANEL WITH GFR
Anion gap: 9 (ref 5–15)
BUN: 17 mg/dL (ref 8–23)
CO2: 25 mmol/L (ref 22–32)
Calcium: 8.9 mg/dL (ref 8.9–10.3)
Chloride: 106 mmol/L (ref 98–111)
Creatinine, Ser: 1 mg/dL (ref 0.61–1.24)
GFR, Estimated: >60 mL/min (ref >60)
Glucose, Bld: 106 mg/dL — ABNORMAL HIGH (ref 70–99)
Potassium: 4.3 mmol/L (ref 3.5–5.1)
Sodium: 140 mmol/L (ref 135–145)

## 2023-06-20 LAB — ECHOCARDIOGRAM COMPLETE
AR max vel: 2.38 cm2
AV Area VTI: 2.34 cm2
AV Area mean vel: 2.23 cm2
AV Mean grad: 6 mmHg
AV Peak grad: 8.9 mmHg
Ao pk vel: 1.49 m/s
Area-P 1/2: 3.17 cm2
Height: 71 in
S' Lateral: 3.1 cm
Weight: 3424 [oz_av]

## 2023-06-20 MED ORDER — APIXABAN 5 MG PO TABS
5.0000 mg | ORAL_TABLET | Freq: Two times a day (BID) | ORAL | 0 refills | Status: DC
  Filled 2023-06-20: qty 60, 30d supply, fill #0

## 2023-06-20 MED ORDER — ORAL CARE MOUTH RINSE: 15.0000 mL | OROMUCOSAL | Status: DC | PRN

## 2023-06-20 MED ORDER — RAMIPRIL 10 MG PO CAPS: 10.0000 mg | ORAL_CAPSULE | Freq: Every day | ORAL | Status: DC

## 2023-06-20 MED ORDER — DOXYCYCLINE HYCLATE 100 MG PO TABS
100.0000 mg | ORAL_TABLET | Freq: Two times a day (BID) | ORAL | Status: DC
  Administered 2023-06-20: 100 mg via ORAL
  Filled 2023-06-20: qty 1

## 2023-06-20 NOTE — TOC Transition Note (Signed)
 Transition of Care [facility]) - Discharge Note   Patient Details  Name: Justin Carr MRN: [id number] Date of Birth: 1945-06-02  Transition of Care [facility]) CM/SW Contact:  Kermit Balo, RN Phone Number: 06/20/2023, 1:15 PM   Clinical Narrative:     Pt will discharge home with self care. No follow up per therapies.  DME at home: CPAP Pt drives self and manages his own medications. Wife will transport home.  Final next level of care: Home/Self Care Barriers to Discharge: No Barriers Identified   Patient Goals and CMS Choice            Discharge Placement                       Discharge Plan and Services Additional resources added to the After Visit Summary for                                       Social Drivers of Health (SDOH) Interventions SDOH Screenings   Food Insecurity: No Food Insecurity (06/20/2023)  Housing: Low Risk  (06/20/2023)  Transportation Needs: No Transportation Needs (06/20/2023)  Utilities: Not At Risk (06/20/2023)  Financial Resource Strain: Low Risk  (05/28/2017)  Physical Activity: Insufficiently Active (05/28/2017)  Stress: No Stress Concern Present (05/28/2017)  Tobacco Use: Medium Risk (06/20/2023)     Readmission Risk Interventions    05/22/2021   10:46 AM  Readmission Risk Prevention Plan  Post Dischage Appt Complete  Medication Screening Complete  Transportation Screening Complete

## 2023-06-20 NOTE — Evaluation (Signed)
 Occupational Therapy Evaluation and DC Summary Patient Details Name: Justin Carr MRN: [id number] DOB: [birth year] Today's Date: 06/20/2023   History of Present Illness   Pt is a 78 yo male presenting to [facility] on 06/19/23 with R hand weakness and incoordination. MRI demonstrated small acute infarct of the L precentral gyrus. PMH: CAD, GERD, HLD, HTN, Type 2 DM, OSA, NSTEMI, Afib     Clinical Impressions PTA pt reports being ind in ADLs/iADLs and mobility, golfing a few days per week. Pt currently presenting close to functional baseline, ambulating independently in hall and ind in ADLs but persists with some R grip weakness and mild coordination deficits. Educated pt on R hand exercises for strengthening and isolated finger movements, handout provided for carryover. Pt demonstrated understanding of HEP, reinforced BeFAST stroke education and emphasized the importance of alerting medical services within time window. Pt has no further acute skilled OT needs, no post acute OT recommended.      If plan is discharge home, recommend the following:   Other (comment) (prn)     Functional Status Assessment   Patient has not had a recent decline in their functional status     Equipment Recommendations   None recommended by OT     Recommendations for Other Services         Precautions/Restrictions   Precautions Precautions: Fall (low fall risk) Recall of Precautions/Restrictions: Intact Restrictions Weight Bearing Restrictions Per Provider Order: No     Mobility Bed Mobility               General bed mobility comments: Pt sitting EOB on arrival    Transfers Overall transfer level: Independent Equipment used: None                      Balance Overall balance assessment: Mild deficits observed, not formally tested                                         ADL either performed or assessed with clinical judgement   ADL Overall ADL's :  Independent                                       General ADL Comments: Pt ambulatory in hall independently no AD, completing standing ADLs at sink without challenge. doffing/donning socks ind in sitting. Ind with tub transfer and negotiated 6 steps no rails. Writing significantly improved per pt report during clock draw.     Vision   Additional Comments: Pt able to read items in menu with reading glasses, no field cuts noted in confrontation test     Perception         Praxis         Pertinent Vitals/Pain Pain Assessment Pain Assessment: No/denies pain     Extremity/Trunk Assessment Upper Extremity Assessment Upper Extremity Assessment: RUE deficits/detail;Right hand dominant RUE Deficits / Details: ROM and strength WFL, sensation intact per pt report. Mild coordination deficits, undershoots with FTN and some trouble with moving digits 2-4 seperately   Lower Extremity Assessment Lower Extremity Assessment: Overall WFL for tasks assessed       Communication Communication Communication: No apparent difficulties   Cognition Arousal: Alert Behavior During Therapy: WFL for tasks assessed/performed Cognition: No apparent impairments  OT - Cognition Comments: administered medi-cog assessment, pt scored 8/10 with two noted discrepancies on the medication transfer log. During one question pt forgot to log in an additional medication on MWF and although he had the correct numbers of pills listed on Saturday he calculated incorrectly                 Following commands: Intact       Cueing  General Comments   Cueing Techniques: Verbal cues      Exercises Other Exercises Other Exercises: Pink therapy exercises HEP Other Exercises: isolated MP ext of digits with palm flat on table, RUE   Shoulder Instructions      Home Living Family/patient expects to be discharged to:: Private residence Living Arrangements: Spouse/significant  other Available Help at Discharge: Family;Available 24 hours/day Type of Home: House Home Access: Stairs to enter [employer] of Steps: 3 Entrance Stairs-Rails: Right Home Layout: Two level;Able to live on main level with bedroom/bathroom (guest room on main level) Alternate Level Stairs-Number of Steps: 10-12 Alternate Level Stairs-Rails: Right (depends on which way he takes, could be on R or L depending on entry point) Bathroom Shower/Tub: Tub/shower unit (walk-in in the master bed)   Bathroom Toilet: Handicapped height     Home Equipment: None          Prior Functioning/Environment Prior Level of Function : Independent/Modified Independent;Driving             Mobility Comments: ind no AD ADLs Comments: Ind, drives and plays golf    OT Problem List: Decreased coordination   OT Treatment/Interventions:        OT Goals(Current goals can be found in the care plan section)   Acute Rehab OT Goals Patient Stated Goal: to go home OT Goal Formulation: With patient Time For Goal Achievement: 07/04/23 Potential to Achieve Goals: Good   OT Frequency:       Co-evaluation              AM-PAC OT "6 Clicks" Daily Activity     Outcome Measure Help from another person eating meals?: None Help from another person taking care of personal grooming?: None Help from another person toileting, which includes using toliet, bedpan, or urinal?: None Help from another person bathing (including washing, rinsing, drying)?: None Help from another person to put on and taking off regular upper body clothing?: None Help from another person to put on and taking off regular lower body clothing?: None 6 Click Score: 24   End of Session Equipment Utilized During Treatment: Gait belt Nurse Communication: Mobility status  Activity Tolerance: Patient tolerated treatment well Patient left: in bed;with call bell/phone within reach  OT Visit Diagnosis: Muscle weakness  (generalized) (M62.81)                Time: 4098-1191 OT Time Calculation (min): 51 min Charges:  OT General Charges $OT Visit: 1 Visit OT Evaluation $OT Eval Low Complexity: 1 Low OT Treatments $Therapeutic Activity: 8-22 mins $Therapeutic Exercise: 8-22 mins  06/20/2023  AB, OTR/L  Acute Rehabilitation Services  Office: [phone]   Tristan Schroeder 06/20/2023, 11:10 AM

## 2023-06-20 NOTE — Consult Note (Signed)
 NEUROLOGY CONSULT NOTE   Date of service: June 20, 2023 Patient Name: Justin Carr MRN:  [id number] DOB:  Jul 31, 1945 Chief Complaint: "R hand weakness" Requesting Provider: Briscoe Burns, MD  History of Present Illness  Justin Carr is a 78 y.o. male with hx of CAD, GERD, hypertension, OSA on CPAP, diabetes, NSTEMI, A-fib in the setting of CABG and eventually taken off of Eliquis who presents with right hand weakness.  Reports that around 2300 on 06/18/2023, he brought his dog after a walk and noted that he had trouble taking his dog off the leash.  He went to bed and woke up in the morning with persistent incoordination in his right hand.  This is gotten better but he came into the ED for further evaluation workup.  He had an MRI brain without contrast which demonstrated a small acute infarct in the left precentral gyrus.  Case were discussed with neurology and patient was admitted to [facility] for further logical workup.  LKW: 06/18/2023 at 2300. Modified rankin score: 0-Completely asymptomatic and back to baseline post- stroke IV Thrombolysis: Not offered, outside of window.  He is also too mild to treat.   EVT: Not offered, he is outside of window and is too mild to treat.  He has no LVO.    NIHSS components Score: Comment  1a Level of Conscious 0[]  1[]  2[]  3[]      1b LOC Questions 0[]  1[]  2[]       1c LOC Commands 0[]  1[]  2[]       2 Best Gaze 0[]  1[]  2[]       3 Visual 0[]  1[]  2[]  3[]      4 Facial Palsy 0[]  1[]  2[]  3[]      5a Motor Arm - left 0[]  1[]  2[]  3[]  4[]  UN[]    5b Motor Arm - Right 0[]  1[]  2[]  3[]  4[]  UN[]    6a Motor Leg - Left 0[]  1[]  2[]  3[]  4[]  UN[]    6b Motor Leg - Right 0[]  1[]  2[]  3[]  4[]  UN[]    7 Limb Ataxia 0[]  1[]  2[]  3[]  UN[]     8 Sensory 0[]  1[]  2[]  UN[]      9 Best Language 0[]  1[]  2[]  3[]      10 Dysarthria 0[]  1[]  2[]  UN[]      11 Extinct. and Inattention 0[]  1[]  2[]       TOTAL: 0      ROS  Comprehensive ROS performed and pertinent positives  documented in HPI   Past History   Past Medical History:  Diagnosis Date   Coronary artery disease    GERD (gastroesophageal reflux disease)    Hyperlipidemia    Hypertension    NSTEMI (non-ST elevated myocardial infarction) (HCC) 06/2007   [name] 07/27/2010   OSA on CPAP    Palpitations    Pneumonia ~ 1971 X 1   Type II diabetes mellitus (HCC)     Past Surgical History:  Procedure Laterality Date   APPENDECTOMY     CARDIOVASCULAR STRESS TEST  10/06/2007   No scintigraphic evidence of inducible myocardial ischemia. ECG positive for ischemia. Pharmacologically induced ST segment depression. Perfusion defect seen in inferior myocardial region consistent with diaphragmatic attenuation.   CORONARY ANGIOPLASTY WITH STENT PLACEMENT  07/03/2007   LAD 90% ulcerated plaque proximal between 1st and 2nd branches stented with a 2.75x39mm Taxus Liberte stent deployed at 16atm. Postdilated at 16atm (3.49mm). resulting in reduction of 90% lesion to 0% residual with TIMI3 flow. Impingement on theostium of diag branch dilatedat 6atm  resulting in reduction of 90% ostial D2 to <50% residual.   CORONARY ANGIOPLASTY WITH STENT PLACEMENT  04/11/2017   CORONARY ARTERY BYPASS GRAFT N/A 05/17/2021   Procedure: CORONARY ARTERY BYPASS GRAFTING TIMES THREE USING LEFT INTERNAL  MAMMARY ARTERY AND RIGHT SAPHENOUS VEIN HARVESTED ENDOSCOPICALLY;  Surgeon: Corliss Skains, MD;  Location: [facility] OR;  Service: Open Heart Surgery;  Laterality: N/A;   CORONARY ULTRASOUND/IVUS N/A 05/11/2021   Procedure: Intravascular Ultrasound/IVUS;  Surgeon: Runell Gess, MD;  Location: [facility] INVASIVE CV LAB;  Service: Cardiovascular;  Laterality: N/A;   INGUINAL HERNIA REPAIR Right 01/06/2018   Procedure: OPEN RIGHT INGUINAL HERNIA REPAIR WITH MESH;  Surgeon: Darnell Level, MD;  Location: [facility] OR;  Service: General;  Laterality: Right;   INSERTION OF MESH Right 01/06/2018   Procedure: INSERTION OF MESH;  Surgeon: Darnell Level, MD;   Location: [facility] OR;  Service: General;  Laterality: Right;   LAPAROSCOPIC APPENDECTOMY N/A 04/01/2018   Procedure: APPENDECTOMY LAPAROSCOPIC;  Surgeon: Darnell Level, MD;  Location: [facility] ORS;  Service: General;  Laterality: N/A;   LEFT HEART CATH AND CORONARY ANGIOGRAPHY N/A 04/11/2017   Procedure: LEFT HEART CATH AND CORONARY ANGIOGRAPHY;  Surgeon: Runell Gess, MD;  Location: [facility] INVASIVE CV LAB;  Service: Cardiovascular;  Laterality: N/A;   LEFT HEART CATH AND CORONARY ANGIOGRAPHY N/A 05/11/2021   Procedure: LEFT HEART CATH AND CORONARY ANGIOGRAPHY;  Surgeon: Runell Gess, MD;  Location: [facility] INVASIVE CV LAB;  Service: Cardiovascular;  Laterality: N/A;   LIGAMENT REPAIR Right 03/2001   wrist   RENAL ARTERIAL DOPPLER  05/20/2012   SMA and Cephalic artery >60% diameter reduction, Rt prox Renal artery 60-99% diameter reduction, Lft prox Renal artery 1-59% diameter reduction, kidneys are normal in size.   SHOULDER ARTHROSCOPY WITH SUBACROMIAL DECOMPRESSION Right 04/03/2013   Procedure: RIGHT SHOULDER ARTHROSCOPY WITH SUBACROMIAL DECOMPRESSION/DISTAL CLAVICLE RESECTION;  Surgeon: Senaida Lange, MD;  Location: [facility] OR;  Service: Orthopedics;  Laterality: Right;   TEE WITHOUT CARDIOVERSION N/A 05/17/2021   Procedure: TRANSESOPHAGEAL ECHOCARDIOGRAM (TEE);  Surgeon: Corliss Skains, MD;  Location: [facility] OR;  Service: Open Heart Surgery;  Laterality: N/A;   TONSILLECTOMY AND ADENOIDECTOMY  ~ 1952   TRANSTHORACIC ECHOCARDIOGRAM  10/06/2007   EF 63%, normal.   WISDOM TOOTH EXTRACTION     age 31    Family History: Family History  Problem Relation Age of Onset   Heart attack Maternal Grandfather    Parkinson's disease Paternal Grandfather    Heart disease Mother    Arrhythmia Father    Dementia Father     Social History  reports that he quit smoking about 15 years ago. His smoking use included cigarettes. He started smoking about 50 years ago. He has a 34 pack-year smoking history. He has  never used smokeless tobacco. He reports current alcohol use. He reports that he does not use drugs.  Allergies  Allergen Reactions   Peanuts [Peanut Oil] Anaphylaxis   Lipitor [Atorvastatin] Other (See Comments)    Muscle cramps   Rosuvastatin Other (See Comments)    Muscle cramps if taken daily-pt still takes drug, but takes 3 times a week.    Medications   Current Facility-Administered Medications:     stroke: early stages of recovery book, , Does not apply, Once, Briscoe Burns, MD   acetaminophen (TYLENOL) tablet 650 mg, 650 mg, Oral, Q4H PRN **OR** acetaminophen (TYLENOL) 160 MG/5ML solution 650 mg, 650 mg, Per Tube, Q4H PRN **OR** acetaminophen (TYLENOL) suppository 650 mg,  650 mg, Rectal, Q4H PRN, Briscoe Burns, MD   aspirin EC tablet 81 mg, 81 mg, Oral, Daily, Briscoe Burns, MD, 81 mg at 07/15/2023 1756   clopidogrel (PLAVIX) tablet 75 mg, 75 mg, Oral, Daily, Austin Miles, Dimas Alexandria, MD   enoxaparin (LOVENOX) injection 40 mg, 40 mg, Subcutaneous, Q24H, [place], Dimas Alexandria, MD, 40 mg at Jul 15, 2023 2113   pregabalin (LYRICA) capsule 100 mg, 100 mg, Oral, BID, Briscoe Burns, MD, 100 mg at 07-15-23 2111   rosuvastatin (CRESTOR) tablet 20 mg, 20 mg, Oral, Once per day on Monday Wednesday Friday, Briscoe Burns, MD, 20 mg at 07/15/23 2111  Vitals   Vitals:   2023/07/15 2015 07/15/2023 2309 06/20/23 0319 06/20/23 0556  BP: (!) 163/66 134/60 (!) 150/70 (!) 142/67  Pulse: (!) 56 (!) 56 (!) 57 62  Resp: 18 16 15 16   Temp: 97.7 F (36.5 C) 97.8 F (36.6 C) 97.9 F (36.6 C) 97.9 F (36.6 C)  TempSrc: Oral Oral Oral Oral  SpO2: 99% 94% 100% 97%  Weight:      Height:        Body mass index is 29.85 kg/m.  Physical Exam   General: Laying comfortably in bed; in no acute distress.  HENT: Normal oropharynx and mucosa. Normal external appearance of ears and nose.  Neck: Supple, no pain or tenderness  CV: No JVD. No peripheral edema.  Pulmonary: Symmetric Chest rise. Normal  respiratory effort.  Abdomen: Soft to touch, non-tender.  Ext: No cyanosis, edema, or deformity  Skin: No rash. Normal palpation of skin.   Musculoskeletal: Normal digits and nails by inspection. No clubbing.   Neurologic Examination  Mental status/Cognition: Alert, oriented to self, place, month and year, good attention.  Speech/language: Fluent, comprehension intact, object naming intact, repetition intact. Cranial nerves:   CN II Pupils equal and reactive to light, no VF deficits    CN III,IV,VI EOM intact, no gaze preference or deviation, no nystagmus    CN V normal sensation in V1, V2, and V3 segments bilaterally    CN VII no asymmetry, no nasolabial fold flattening    CN VIII normal hearing to speech    CN IX & X normal palatal elevation, no uvular deviation    CN XI 5/5 head turn and 5/5 shoulder shrug bilaterally    CN XII midline tongue protrusion    Motor:  Muscle bulk: normal, tone normal, pronator drift none tremor none Mvmt Root Nerve  Muscle Right Left Comments  SA C5/6 Ax Deltoid 5 5   EF C5/6 Mc Biceps 5 5   EE C6/7/8 Rad Triceps 5 5   WF C6/7 Med FCR     WE C7/8 PIN ECU     F Ab C8/T1 U ADM/FDI 5 5   HF L1/2/3 Fem Illopsoas 5 5   KE L2/3/4 Fem Quad 5 5   DF L4/5 D Peron Tib Ant 5 5   PF S1/2 Tibial Grc/Sol 5 5    Sensation:  Light touch Intact throughout   Pin prick    Temperature    Vibration   Proprioception    Coordination/Complex Motor:  - Finger to Nose intact bilaterally - Heel to shin intact bilaterally - Rapid alternating movement are normal - Gait: Gait patient safety. Labs/Imaging/Neurodiagnostic studies   CBC:  Recent Labs  Lab Jul 15, 2023 1036  WBC 5.6  NEUTROABS 3.2  HGB 16.3  HCT 49.2  MCV 99.6  PLT 136*   Basic Metabolic Panel:  Lab Results  Component Value Date   NA 137 06/19/2023   K 4.3 06/19/2023   CO2 24 06/19/2023   GLUCOSE 171 (H) 06/19/2023   BUN 22 06/19/2023   CREATININE 1.00 06/19/2023   CALCIUM 9.0 06/19/2023    GFRNONAA >60 06/19/2023   GFRAA >60 09/04/2019   Lipid Panel:  Lab Results  Component Value Date   LDLCALC 29 05/19/2021   HgbA1c:  Lab Results  Component Value Date   HGBA1C 5.9 (H) 06/19/2023   Urine Drug Screen: No results found for: "LABOPIA", "COCAINSCRNUR", "LABBENZ", "AMPHETMU", "THCU", "LABBARB"  Alcohol Level     Component Value Date/Time   ETH <10 06/19/2023 1036   INR  Lab Results  Component Value Date   INR 1.0 06/19/2023   APTT  Lab Results  Component Value Date   APTT 32 06/19/2023   AED levels: No results found for: "PHENYTOIN", "ZONISAMIDE", "LAMOTRIGINE", "LEVETIRACETA"  CT Head without contrast(Personally reviewed): CTH was negative for a large hypodensity concerning for a large territory infarct or hyperdensity concerning for an ICH  CT angio Head and Neck with contrast(Personally reviewed): No LVO.  MRI Brain(Personally reviewed): Left precentral gyrus acute infarct.  ASSESSMENT   Justin Carr is a 78 y.o. male with hx of CAD, GERD, hypertension, OSA on CPAP, diabetes, NSTEMI, A-fib in the setting of CABG and eventually taken off of Eliquis who presents with right hand weakness that resolved after several hours.  He was found to have a small acute infarct left precentral gyrus.  Etiology is unclear.  However, stroke appears more consistent with embolic phenomena rather than small vessel disease.  RECOMMENDATIONS  - Frequent Neuro checks per stroke unit protocol - Recommend obtaining TTE - Recommend obtaining Lipid panel with LDL - Please start statin if LDL > 70 - Recommend HbA1c to evaluate for diabetes and how well it is controlled. - Antithrombotic -aspirin 81 mg daily along with Plavix and family comes today for 21 days, followed by aspirin 81 mg daily alone. - Recommend DVT ppx - SBP goal - permissive hypertension first 24 h < 220/110. Held home meds.  - Recommend Telemetry monitoring for arrythmia - Recommend bedside swallow screen  prior to PO intake. - Stroke education booklet - Recommend PT/OT/SLP consult  ______________________________________________________________________    Signed, Erick Blinks, MD [facility]

## 2023-06-20 NOTE — Plan of Care (Signed)
 progressing

## 2023-06-20 NOTE — Plan of Care (Signed)
Discharged to home with wife.

## 2023-06-20 NOTE — Discharge Summary (Signed)
 Physician Discharge Summary  [name]:[id number] DOB: Feb 04, 1946 DOA: 06/19/2023  PCP: Noberto Retort, MD  Admit date: 06/19/2023 Discharge date: 06/20/2023    Admitted From: Home Disposition: Home  Recommendations for Outpatient Follow-up:  Follow up with PCP in 1-2 weeks Please obtain BMP/CBC in one week Follow-up with neurology in 4 weeks Please follow up with your PCP on the following pending results: Unresulted Labs (From admission, onward)    None         Home Health: None Equipment/Devices: None  Discharge Condition: Stable  CODE STATUS: Full code Diet recommendation: Cardiac  HPI: Justin Carr is a 78 y.o. male with medical history significant of T2DM with neuropathy, HTN, HLD, OSA, CAD s/p CABG x3, renal artery stenosis presenting to the ED with right hand numbness.   Patient reports that he took his dog for a walk around 11pm yesterday evening. When he returned home, he noticed having difficulty with letting his dog off his leash and also noted difficulty with writing with his right hand. States that his right arm felt different than his left mainly in regards to sensation. States that right hand feels a little better today but still not at his usual baseline. Denies any facial droop, slurred speech, dysarthria/aphasia, weakness in any extremity, imbalance with ambulation. Denies any previous history of stroke.    ED course: Vital signs stable. CBC with thrombocytopenia, otherwise unremarkable. CMP with hyperglycemia to 171, otherwise unremarkable. PT/INR/APTT within normal limits. Ethanol level negative. CT head negative. MRI brain showing small acute infarct in the left precentral gyrus. ED provider consulted neurology, Dr. Iver Nestle, who recommended obtaining CTA head/neck and admission for stroke workup. [facility] hospitalist asked to evaluate patient for admission.  Subjective: Seen and examined this morning.  He says that his right hand weakness is at least 50% or  slightly more improved.  No other complaint.  He was walking in the room when I entered.  Brief/Interim Summary: Patient was admitted for further workup of stroke.  Details below.  Acute small left precentral gyrus infarct/hyperlipidemia Patient presenting with mild right hand fine motor weakness that began around 11:30 PM on 06/18/2023.  No indication for IV thrombolysis given he is outside of window.  Seen by neurology upon arrival to [facility], aspirin continued, Plavix was added. Hemoglobin A1c 5.9, better than 2 years ago.  Lipid panel showed LDL within normal range and slightly elevated triglyceride around 183.  Echo was completed which did not show any PFO.  Seen by PT OT, since right hand weakness has improved and during my examination, it has appeared to resolve completely, PT OT did not recommend any further PT/OT.  Initially he was started on aspirin and Plavix however he was reassessed by neurology today who cleared him for discharge but recommended stopping antiplatelet and starting on Eliquis.  They have discussed this with the patient and pros and cons of the medications as well.  Resuming statin.   Essential hypertension Patient taking ramipril 10 mg daily, metoprolol tartrate 12.5 mg twice daily at home.  Held both of these medications to allow permissive hypertension.  He is now 48 hours after the incident.  Plan to resume home medications starting tomorrow.   Type 2 diabetes with neuropathy Patient taking Farxiga 5 mg daily and Trulicity 1.5 mg weekly.  Random blood glucose resume home medications.   CAD status post CABG x 3 Patient reports that that he had a previous allergy to Crestor when taking daily.  Discussed with cardiology, who recommended taking Crestor every Monday, Wednesday, Friday.  Has been tolerating this dosing well.  Discharge plan was discussed with patient and/or family member and they verbalized understanding and agreed with it.  Discharge Diagnoses:   Principal Problem:   Acute CVA (cerebrovascular accident) [facility])    Discharge Instructions   Allergies as of 06/20/2023       Reactions   Peanuts [peanut Oil] Anaphylaxis   Lipitor [atorvastatin] Other (See Comments)   Muscle cramps   Rosuvastatin Other (See Comments)   Muscle cramps if taken daily-pt still takes drug, but takes 3 times a week.        Medication List     STOP taking these medications    aspirin 81 MG tablet       TAKE these medications    Alpha-Lipoic Acid 300 MG Caps Take 300 mg by mouth in the morning and at bedtime.   apixaban 5 MG Tabs tablet Commonly known as: ELIQUIS Take 1 tablet (5 mg total) by mouth 2 (two) times daily.   COQ-10 PO Take 1 capsule by mouth every evening.   doxycycline 100 MG tablet Commonly known as: VIBRA-TABS Take 1 tablet (100 mg total) by mouth 2 (two) times daily.   Farxiga 10 MG Tabs tablet Generic drug: dapagliflozin propanediol Take 10 mg by mouth in the morning.   gentamicin cream 0.1 % Commonly known as: GARAMYCIN Apply 1 Application topically 2 (two) times daily. What changed:  when to take this additional instructions   metoprolol tartrate 25 MG tablet Commonly known as: LOPRESSOR Take 1/2 (one-half) tablet by mouth twice daily What changed:  how much to take how to take this when to take this additional instructions   multivitamin with minerals Tabs tablet Take 1 tablet by mouth 2 (two) times daily with a meal.   nitroGLYCERIN 0.4 MG SL tablet Commonly known as: NITROSTAT DISSOLVE ONE TABLET UNDER THE TONGUE EVERY 5 MINUTES AS NEEDED FOR CHEST PAIN.  DO NOT EXCEED A TOTAL OF 3 DOSES IN 15 MINUTES   pregabalin 100 MG capsule Commonly known as: LYRICA Take 100 mg by mouth 2 (two) times daily.   PRESCRIPTION MEDICATION CPAP- At bedtime   PRESERVISION AREDS PO Take 1 capsule by mouth 2 (two) times daily.   ramipril 10 MG capsule Commonly known as: ALTACE Take 1 capsule (10 mg total)  by mouth daily. Start taking on: June 22, 2023 What changed: These instructions start on June 22, 2023. If you are unsure what to do until then, ask your doctor or other care provider.   rosuvastatin 20 MG tablet Commonly known as: CRESTOR TAKE 1 TABLET BY MOUTH THREE TIMES A WEEK What changed: See the new instructions.   sildenafil 100 MG tablet Commonly known as: VIAGRA Take 100 mg by mouth daily as needed for erectile dysfunction.   Trulicity 1.5 MG/0.5ML Soaj Generic drug: Dulaglutide Inject 1.5 mg into the skin every Monday.        Allergies  Allergen Reactions   Peanuts [Peanut Oil] Anaphylaxis   Lipitor [Atorvastatin] Other (See Comments)    Muscle cramps   Rosuvastatin Other (See Comments)    Muscle cramps if taken daily-pt still takes drug, but takes 3 times a week.    Consultations: Neurology   Procedures/Studies: ECHOCARDIOGRAM COMPLETE Result Date: 06/20/2023    ECHOCARDIOGRAM REPORT   Patient Name:   ANDRU GENTER Date of Exam: 06/20/2023 Medical Rec #:  [id number]    Height:  71.0 in Accession #:    [id number]   Weight:       214.0 lb Date of Birth:  07/13/1945    BSA:          2.170 m Patient Age:    77 years     BP:           137/63 mmHg Patient Gender: M            HR:           67 bpm. Exam Location:  Inpatient Procedure: 2D Echo, Cardiac Doppler and Color Doppler (Both Spectral and Color            Flow Doppler were utilized during procedure). Indications:    Stroke  History:        Patient has prior history of Echocardiogram examinations, most                 recent 05/11/2021. Prior CABG; Risk Factors:Diabetes and                 Hypertension.  Sonographer:    Darlys Gales Referring Phys: Briscoe Burns IMPRESSIONS  1. Left ventricular ejection fraction, by estimation, is 60 to 65%. The left ventricle has normal function. The left ventricle has no regional wall motion abnormalities. Left ventricular diastolic parameters are consistent with Grade I diastolic  dysfunction (impaired relaxation).  2. Right ventricular systolic function is normal. The right ventricular size is normal.  3. The mitral valve is normal in structure. No evidence of mitral valve regurgitation. No evidence of mitral stenosis.  4. The aortic valve is normal in structure. Aortic valve regurgitation is not visualized. No aortic stenosis is present.  5. The inferior vena cava is normal in size with greater than 50% respiratory variability, suggesting right atrial pressure of 3 mmHg. FINDINGS  Left Ventricle: Left ventricular ejection fraction, by estimation, is 60 to 65%. The left ventricle has normal function. The left ventricle has no regional wall motion abnormalities. The left ventricular internal cavity size was normal in size. There is  no left ventricular hypertrophy. Left ventricular diastolic parameters are consistent with Grade I diastolic dysfunction (impaired relaxation). Right Ventricle: The right ventricular size is normal. No increase in right ventricular wall thickness. Right ventricular systolic function is normal. Left Atrium: Left atrial size was normal in size. Right Atrium: Right atrial size was normal in size. Pericardium: There is no evidence of pericardial effusion. Mitral Valve: The mitral valve is normal in structure. No evidence of mitral valve regurgitation. No evidence of mitral valve stenosis. Tricuspid Valve: The tricuspid valve is normal in structure. Tricuspid valve regurgitation is not demonstrated. No evidence of tricuspid stenosis. Aortic Valve: The aortic valve is normal in structure. Aortic valve regurgitation is not visualized. No aortic stenosis is present. Aortic valve mean gradient measures 6.0 mmHg. Aortic valve peak gradient measures 8.9 mmHg. Aortic valve area, by VTI measures 2.34 cm. Pulmonic Valve: The pulmonic valve was normal in structure. Pulmonic valve regurgitation is trivial. No evidence of pulmonic stenosis. Aorta: The aortic root is normal in  size and structure. Venous: The inferior vena cava is normal in size with greater than 50% respiratory variability, suggesting right atrial pressure of 3 mmHg. IAS/Shunts: No atrial level shunt detected by color flow Doppler.  LEFT VENTRICLE PLAX 2D LVIDd:         4.50 cm   Diastology LVIDs:         3.10 cm   LV  e' medial:    7.18 cm/s LV PW:         0.90 cm   LV E/e' medial:  9.8 LV IVS:        1.00 cm   LV e' lateral:   13.40 cm/s LVOT diam:     2.00 cm   LV E/e' lateral: 5.2 LV SV:         80 LV SV Index:   37 LVOT Area:     3.14 cm  RIGHT VENTRICLE RV S prime:     13.30 cm/s TAPSE (M-mode): 1.4 cm LEFT ATRIUM             Index        RIGHT ATRIUM           Index LA Vol (A2C):   38.5 ml 17.74 ml/m  RA Area:     13.60 cm LA Vol (A4C):   35.6 ml 16.40 ml/m  RA Volume:   28.10 ml  12.95 ml/m LA Biplane Vol: 38.2 ml 17.60 ml/m  AORTIC VALVE AV Area (Vmax):    2.38 cm AV Area (Vmean):   2.23 cm AV Area (VTI):     2.34 cm AV Vmax:           149.00 cm/s AV Vmean:          116.000 cm/s AV VTI:            0.341 m AV Peak Grad:      8.9 mmHg AV Mean Grad:      6.0 mmHg LVOT Vmax:         113.00 cm/s LVOT Vmean:        82.200 cm/s LVOT VTI:          0.254 m LVOT/AV VTI ratio: 0.74  AORTA Ao Root diam: 3.20 cm MITRAL VALVE MV Area (PHT): 3.17 cm    SHUNTS MV Decel Time: 239 msec    Systemic VTI:  0.25 m MV E velocity: 70.20 cm/s  Systemic Diam: 2.00 cm MV A velocity: 96.90 cm/s MV E/A ratio:  0.72 Donato Schultz MD Electronically signed by Donato Schultz MD Signature Date/Time: 06/20/2023/3:20:02 PM    Final    CT ANGIO HEAD NECK W WO CM Result Date: 06/19/2023 CLINICAL DATA:  Stroke, determine embolic source EXAM: CT ANGIOGRAPHY HEAD AND NECK WITH AND WITHOUT CONTRAST TECHNIQUE: Multidetector CT imaging of the head and neck was performed using the standard protocol during bolus administration of intravenous contrast. Multiplanar CT image reconstructions and MIPs were obtained to evaluate the vascular anatomy. Carotid  stenosis measurements (when applicable) are obtained utilizing NASCET criteria, using the distal internal carotid diameter as the denominator. RADIATION DOSE REDUCTION: This exam was performed according to the departmental dose-optimization program which includes automated exposure control, adjustment of the mA and/or kV according to patient size and/or use of iterative reconstruction technique. CONTRAST:  75mL OMNIPAQUE IOHEXOL 350 MG/ML SOLN COMPARISON:  Brain MRI from earlier today FINDINGS: CTA NECK FINDINGS Aortic arch: Limited coverage showing atheromatous plaque. No acute finding. Right carotid system: Diffuse atheromatous wall thickening of the common carotid with mixed density plaque heavily deposited at the bifurcation. No flow reducing stenosis or ulceration. Left carotid system: Diffuse atheromatous wall thickening of the common carotid with mixed density plaque at the bifurcation not causing flow reducing stenosis. No ulceration or beading Vertebral arteries: Proximal subclavian atherosclerosis. Despite the atherosclerosis it is a kink that is associated with approximately 50% narrowing of the proximal right  vertebral artery. Atheromatous plaque at both vertebral origins without flow reducing stenosis by coronal reformats. No dissection or beading. Skeleton: No acute or aggressive finding Other neck: No acute finding Upper chest: Clear apical lungs Review of the MIP images confirms the above findings CTA HEAD FINDINGS Anterior circulation: Atheromatous calcification of the cavernous carotids. No branch occlusion, beading, or aneurysm. Posterior circulation: The vertebral and basilar arteries are smoothly contoured and diffusely patent. No branch occlusion, beading, or aneurysm Venous sinuses: Unremarkable Anatomic variants: None significant Review of the MIP images confirms the above findings IMPRESSION: No emergent finding. Atherosclerosis. No ulceration or flow reducing stenosis seen underlying the  patient's acute infarcts. Electronically Signed   By: Tiburcio Pea M.D.   On: 06/19/2023 18:44   MR BRAIN WO CONTRAST Result Date: 06/19/2023 CLINICAL DATA:  Neuro deficit, acute, stroke suspected. Right hand weakness beginning yesterday evening. EXAM: MRI HEAD WITHOUT CONTRAST TECHNIQUE: Multiplanar, multiecho pulse sequences of the brain and surrounding structures were obtained without intravenous contrast. COMPARISON:  Head CT 06/19/2023 and MRI 09/05/2019 FINDINGS: Brain: There is a small acute infarct involving the left precentral gyrus in the hand motor region. No intracranial hemorrhage, mass, midline shift, or extra-axial fluid collection is identified. A few punctate T2 hyperintensities in the cerebral white matter are within normal limits for age. Cerebral volume is normal with normal size of the ventricles. Vascular: Major intracranial vascular flow voids are preserved. Skull and upper cervical spine: Unremarkable bone marrow signal. Sinuses/Orbits: Bilateral cataract extraction. Mucous retention cyst in the left maxillary sinus. Clear mastoid air cells. Other: None. IMPRESSION: Small acute infarct in the left precentral gyrus. Electronically Signed   By: Sebastian Ache M.D.   On: 06/19/2023 15:01   CT HEAD WO CONTRAST Result Date: 06/19/2023 CLINICAL DATA:  Right hand weakness. EXAM: CT HEAD WITHOUT CONTRAST TECHNIQUE: Contiguous axial images were obtained from the base of the skull through the vertex without intravenous contrast. RADIATION DOSE REDUCTION: This exam was performed according to the departmental dose-optimization program which includes automated exposure control, adjustment of the mA and/or kV according to patient size and/or use of iterative reconstruction technique. COMPARISON:  July 04, 2007 FINDINGS: Brain: No evidence of acute infarction, hemorrhage, hydrocephalus, extra-axial collection or mass lesion/mass effect. Vascular: Moderate to marked severity bilateral cavernous  carotid artery calcification is noted. Skull: Normal. Negative for fracture or focal lesion. Sinuses/Orbits: No acute finding. Other: None. IMPRESSION: No acute intracranial abnormality. Electronically Signed   By: Aram Candela M.D.   On: 06/19/2023 11:47   VAS US CAROTID Result Date: 06/13/2023 Carotid Arterial Duplex Study Patient Name:  GARNER DULLEA  Date of Exam:   06/13/2023 Medical Rec #: [id number]     Accession #:    [id number] Date of Birth: 1945-12-28     Patient Gender: M Patient Age:   79 years Exam Location:  [facility] Procedure:      VAS US CAROTID Referring Phys: Charlesetta Garibaldi WEST --------------------------------------------------------------------------------  Indications:      Bilateral bruits. Risk Factors:     Hypertension, hyperlipidemia, Diabetes, coronary artery                   disease. Comparison Study: 04/11/2021 Carotid artery duplex- bilateral 1-39% ICA                   stenosis. Performing Technologist: Gertie Fey MHA, RDMS, RVT, RDCS  Examination Guidelines: A complete evaluation includes B-mode imaging, spectral Doppler, color Doppler, and power Doppler as needed of  all accessible portions of each vessel. Bilateral testing is considered an integral part of a complete examination. Limited examinations for reoccurring indications may be performed as noted.  Right Carotid Findings: +----------+--------+--------+--------+--------------------------+--------+           PSV cm/sEDV cm/sStenosisPlaque Description        Comments +----------+--------+--------+--------+--------------------------+--------+ CCA Prox  147     20                                                 +----------+--------+--------+--------+--------------------------+--------+ CCA Distal107     13                                                 +----------+--------+--------+--------+--------------------------+--------+ ICA Prox  143     20              heterogenous and irregular          +----------+--------+--------+--------+--------------------------+--------+ ICA Distal70      24                                                 +----------+--------+--------+--------+--------------------------+--------+ ECA       161     10              heterogenous and irregular         +----------+--------+--------+--------+--------------------------+--------+ +----------+--------+-------+--------+-------------------+           PSV cm/sEDV cmsDescribeArm Pressure (mmHG) +----------+--------+-------+--------+-------------------+ Subclavian330     0      Stenotic127                 +----------+--------+-------+--------+-------------------+ +---------+--------+--+--------+-+---------+ VertebralPSV cm/s35EDV cm/s9Antegrade +---------+--------+--+--------+-+---------+  Left Carotid Findings: +----------+--------+--------+--------+--------------------------+--------+           PSV cm/sEDV cm/sStenosisPlaque Description        Comments +----------+--------+--------+--------+--------------------------+--------+ CCA Prox  131     16                                                 +----------+--------+--------+--------+--------------------------+--------+ CCA Distal122     19              smooth and heterogenous            +----------+--------+--------+--------+--------------------------+--------+ ICA Prox  189     20              heterogenous and irregular         +----------+--------+--------+--------+--------------------------+--------+ ICA Distal75      17                                                 +----------+--------+--------+--------+--------------------------+--------+ ECA       180     0               heterogenous and irregular         +----------+--------+--------+--------+--------------------------+--------+ +----------+--------+--------+----------------+-------------------+  PSV cm/sEDV cm/sDescribe        Arm Pressure  (mmHG) +----------+--------+--------+----------------+-------------------+ [id number]     0       Multiphasic, ACZ660                 +----------+--------+--------+----------------+-------------------+ +---------+--------+--+--------+-+---------+ VertebralPSV cm/s47EDV cm/s9Antegrade +---------+--------+--+--------+-+---------+   Summary: Right Carotid: Velocities in the right ICA are consistent with a 1-39% stenosis. Left Carotid: Velocities in the left ICA are consistent with a 1-39% stenosis. Vertebrals:  Bilateral vertebral arteries demonstrate antegrade flow. Subclavians: Right subclavian artery was stenotic. Normal flow hemodynamics were              seen in the left subclavian artery. *See table(s) above for measurements and observations.  Electronically signed by Charlton Haws MD on 06/13/2023 at 2:25:51 PM.    Final    MYOCARDIAL PERFUSION IMAGING Result Date: 05/28/2023   Findings are consistent with no ischemia and no infarction on perfusion images. The study is intermediate risk due to presence of transient ischemic dilation, cannot exclude balanced ischemia. If symptoms persist, consider alternate ischemic evaluation (PET CT with MBFR vs cath).   No ST deviation was noted.   LV perfusion is normal. There is no evidence of ischemia. There is no evidence of infarction.   Left ventricular function is normal. Nuclear stress EF: 59%. The left ventricular ejection fraction is normal (55-65%). End diastolic cavity size is normal. End systolic cavity size is normal. Evidence of transient ischemic dilation (TID) noted. TID was appreciated visually and quantitatively.   Prior study available for comparison from 04/04/2017. There are changes in perfusion compared to prior study which appear to be improved.     Discharge Exam: Vitals:   06/20/23 1118 06/20/23 1526  BP: 137/63 126/65  Pulse: 71 73  Resp: 18 17  Temp: 98.8 F (37.1 C) 98.3 F (36.8 C)  SpO2: 99% 97%   Vitals:    06/20/23 0556 06/20/23 0733 06/20/23 1118 06/20/23 1526  BP: (!) 142/67 (!) 143/59 137/63 126/65  Pulse: 62 (!) 55 71 73  Resp: 16 18 18 17   Temp: 97.9 F (36.6 C) 97.7 F (36.5 C) 98.8 F (37.1 C) 98.3 F (36.8 C)  TempSrc: Oral Oral Oral Oral  SpO2: 97% 99% 99% 97%  Weight:      Height:        General: Pt is alert, awake, not in acute distress Cardiovascular: RRR, S1/S2 +, no rubs, no gallops Respiratory: CTA bilaterally, no wheezing, no rhonchi Abdominal: Soft, NT, ND, bowel sounds + Extremities: no edema, no cyanosis    The results of significant diagnostics from this hospitalization (including imaging, microbiology, ancillary and laboratory) are listed below for reference.     Microbiology: No results found for this or any previous visit (from the past 240 hours).   Labs: BNP (last 3 results) No results for input(s): "BNP" in the last 8760 hours. Basic Metabolic Panel: Recent Labs  Lab 06/19/23 1036 06/20/23 0649  NA 137 140  K 4.3 4.3  CL 106 106  CO2 24 25  GLUCOSE 171* 106*  BUN 22 17  CREATININE 1.00 1.00  CALCIUM 9.0 8.9   Liver Function Tests: Recent Labs  Lab 06/19/23 1036  AST 23  ALT 27  ALKPHOS 57  BILITOT 0.6  PROT 6.4*  ALBUMIN 3.8   No results for input(s): "LIPASE", "AMYLASE" in the last 168 hours. No results for input(s): "AMMONIA" in the last 168 hours. CBC: Recent Labs  Lab 06/19/23 1036  WBC 5.6  NEUTROABS 3.2  HGB 16.3  HCT 49.2  MCV 99.6  PLT 136*   Cardiac Enzymes: No results for input(s): "CKTOTAL", "CKMB", "CKMBINDEX", "TROPONINI" in the last 168 hours. BNP: Invalid input(s): "POCBNP" CBG: Recent Labs  Lab 06/19/23 1027 06/19/23 1706 06/19/23 2105 06/20/23 0608 06/20/23 1118  GLUCAP 177* 161* 82 104* 137*   D-Dimer No results for input(s): "DDIMER" in the last 72 hours. Hgb A1c Recent Labs    06/19/23 1036  HGBA1C 5.9*   Lipid Profile Recent Labs    06/20/23 0649  CHOL 134  HDL 36*  LDLCALC  61  TRIG 186*  CHOLHDL 3.7   Thyroid function studies No results for input(s): "TSH", "T4TOTAL", "T3FREE", "THYROIDAB" in the last 72 hours.  Invalid input(s): "FREET3" Anemia work up No results for input(s): "VITAMINB12", "FOLATE", "FERRITIN", "TIBC", "IRON", "RETICCTPCT" in the last 72 hours. Urinalysis    Component Value Date/Time   COLORURINE YELLOW 05/16/2021 1442   APPEARANCEUR HAZY (A) 05/16/2021 1442   LABSPEC 1.008 05/16/2021 1442   PHURINE 6.0 05/16/2021 1442   GLUCOSEU 50 (A) 05/16/2021 1442   HGBUR NEGATIVE 05/16/2021 1442   BILIRUBINUR NEGATIVE 05/16/2021 1442   KETONESUR NEGATIVE 05/16/2021 1442   PROTEINUR NEGATIVE 05/16/2021 1442   NITRITE NEGATIVE 05/16/2021 1442   LEUKOCYTESUR NEGATIVE 05/16/2021 1442   Sepsis Labs Recent Labs  Lab 06/19/23 1036  WBC 5.6   Microbiology No results found for this or any previous visit (from the past 240 hours).  FURTHER DISCHARGE INSTRUCTIONS:   Get Medicines reviewed and adjusted: Please take all your medications with you for your next visit with your Primary MD   Laboratory/radiological data: Please request your Primary MD to go over all hospital tests and procedure/radiological results at the follow up, please ask your Primary MD to get all Hospital records sent to his/her office.   In some cases, they will be blood work, cultures and biopsy results pending at the time of your discharge. Please request that your primary care M.D. goes through all the records of your hospital data and follows up on these results.   Also Note the following: If you experience worsening of your admission symptoms, develop shortness of breath, life threatening emergency, suicidal or homicidal thoughts you must seek medical attention immediately by calling 911 or calling your MD immediately  if symptoms less severe.   You must read complete instructions/literature along with all the possible adverse reactions/side effects for all the  Medicines you take and that have been prescribed to you. Take any new Medicines after you have completely understood and accpet all the possible adverse reactions/side effects.    Do not drive when taking Pain medications or sleeping medications (Benzodaizepines)   Do not take more than prescribed Pain, Sleep and Anxiety Medications. It is not advisable to combine anxiety,sleep and pain medications without talking with your primary care practitioner   Special Instructions: If you have smoked or chewed Tobacco  in the last 2 yrs please stop smoking, stop any regular Alcohol  and or any Recreational drug use.   Wear Seat belts while driving.   Please note: You were cared for by a hospitalist during your hospital stay. Once you are discharged, your primary care physician will handle any further medical issues. Please note that NO REFILLS for any discharge medications will be authorized once you are discharged, as it is imperative that you return to your primary care physician (or establish a relationship with a primary care physician  if you do not have one) for your post hospital discharge needs so that they can reassess your need for medications and monitor your lab values  Time coordinating discharge: Over 30 minutes  SIGNED:   Hughie Closs, MD  [facility] 06/20/2023, 4:26 PM *Please note that this is a verbal dictation therefore any spelling or grammatical errors are due to the "[employer]" system interpretation. If 7PM-7AM, please contact night-coverage [email or web address]

## 2023-06-20 NOTE — Progress Notes (Unsigned)
 Cardiology Office Note    Date:  06/26/2023  ID:  NEELY CECENA, DOB 1945-09-30, MRN [id number] PCP:  Noberto Retort, MD  Cardiologist:  Nanetta Batty, MD  Electrophysiologist:  None   Chief Complaint: Follow up for CVA   History of Present Illness: .    NIKOLIS BERENT is a 78 y.o. male with visit-pertinent history of OSA on CPAP, hypertension, hyperlipidemia CAD with NSTEMI in 06/2007, he underwent PCI and stenting of his proximal LAD at that time.  History of CABG x 3 with LIMA to LAD, vein to an obtuse marginal branch and PDA in 04/2021.  On July 02, 2007 patient had a NSTEMI, underwent PCI and stenting of his proximal LAD with Taxus Liberty drug-eluting stent.  He also had a 60% right renal artery stenosis documented angiographically at that time, followed by Dr. Allyson Sabal with renal duplex ultrasound.  In 03/2017 he underwent Myoview stress testing that showed ischemia in the LAD territory.  He underwent diagnostic cath that revealed a 99% in-stent restenosis within the proximal LAD stent.  Remainder of his coronary anatomy was free of significant disease and his LV function was normal.  He was restented with a 3 mm x 20 mm long Synergy drug-eluting stent.  On 05/11/2021 patient underwent diagnostic coronary angiography as he was having discomfort in his left bicep that occurred at rest and with exertion, similar to his prior anginal symptoms.  Angiography revealed a patent proximal LAD stent with 70% ostial left main and 75% proximal RCA stenosis.  He underwent CABG x 3 with Dr. Cliffton Asters on 05/17/2021 with LIMA to his LAD, vein to an obtuse marginal branch and PDA.  He did have perioperative paroxysmal atrial fibrillation and was discharged home on Eliquis and amiodarone.  He has had no documented recurrence.  Patient was last seen in clinic on 04/26/2023 by Dr. Allyson Sabal, he remained stable from a cardiac standpoint.  He was regularly playing golf 3-4 times a week and was walking 2 and half to 3 miles a  day.  Patient was seen in clinic on 05/17/2023.  Patient reported a vague numbness in his left arm as well as a slight occasional discomfort at his bicep more towards his elbow.  Patient noted concern as his prior anginal equivalent was left bicep pain that started his shoulder and radiated down to his elbow.  It was reviewed with Dr. Herbie Baltimore who recommended patient undergo stress test.  MPI findings were consistent with no ischemia no infarction on perfusion can images this study was however considered to be intermediate risk due to presence of transient ischemic dilation, unable to exclude balanced ischemia.  Reviewed with Dr. Allyson Sabal who recommended a trial of Imdur.  Carotid duplex on 06/13/2023 indicated bilateral 1 to 39% stenosis.  On 06/19/2023 patient presented to the ED for evaluation of right hand numbness.  Patient reported that he been walking his dog around 11 PM that night, when he returned he was having difficulty with the fine motor skills of his hand and having difficulty letting go of the dog leash.  Brain MRI without contrast demonstrated a small acute infarct in the left precentral gyrus.  Patient with history of transient atrial fibrillation in setting of CABG, neurology felt likely that his stroke was cardioembolic from paroxysmal atrial fibrillation.  Patient was started on Eliquis 5 mg twice daily.  Today he presents for follow-up.  He reports that he has been doing well overall. He denies any chest pain, shortness  of breath, lower extremity edema, orthopnea or pnd. Patient notes only residual from his CVA is some right hand weakness, making it difficult to write, notes he is luckily still able to golf. He denies every having any palpitations or feeling of increased heart rate. He has obtained an apple watch to monitor for afib.   ROS: .   Today he denies chest pain, shortness of breath, lower extremity edema, fatigue, palpitations, melena, hematuria, hemoptysis, diaphoresis, weakness,  presyncope, syncope, orthopnea, and PND.  All other systems are reviewed and otherwise negative. Studies Reviewed: Marland Kitchen   EKG:  EKG is not ordered today.  CV Studies: Cardiac studies reviewed are outlined and summarized above. Otherwise please see EMR for full report. Cardiac Studies & Procedures   ______________________________________________________________________________________________ CARDIAC CATHETERIZATION  CARDIAC CATHETERIZATION 05/11/2021  Narrative Images from the original result were not included.    Prox RCA lesion is 75% stenosed.   Ost LM to Mid LM lesion is 70% stenosed.   Mid LAD lesion is 30% stenosed.   Previously placed Prox LAD to Mid LAD stent (unknown type) is  widely patent.   The left ventricular systolic function is normal.   LV end diastolic pressure is normal.   The left ventricular ejection fraction is 50-55% by visual estimate.  EULIS SALAZAR is a 78 y.o. male   [id number] LOCATION:  FACILITY: [facility] PHYSICIAN: Nanetta Batty, M.D. [birth year]   DATE OF PROCEDURE:  05/11/2021  DATE OF DISCHARGE:     CARDIAC CATHETERIZATION / IVUS LM    History obtained from chart review.KEMP GOMES is a 78 y.o.  mildly overweight married Caucasian male, father of 2 children (1 living), who I  I last saw in the office 03/29/2021. 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 His problems include obstructive sleep apnea, on CPAP, hypertension, and hyperlipidemia. He denies chest pain or shortness of breath. He had a non-ST-segment-elevation myocardial infarction, July 02, 2007. He underwent PCI and stenting of his proximal LAD with a Taxus Liberte drug-eluting stent. He had also had a 60% right renal artery stenosis documented angiographically at that time, which we have been following by duplex ultrasound. Since I saw him back a year ago he denies chest pain or shortness  of breath but has noticed some left upper extremity discomfort with exertion similar to his pre-MI symptoms. Marland Kitchen  He  underwent Myoview stress testing 04/04/17 that showed ischemia in the LAD territory. I performed radial diagnostic cath on him 04/11/17 revealed a 99% "in-stent restenosis" within the proximal LAD stent. Remainder of his coronary anatomy was free of significant disease in his LV function was normal. I restarted him with a 3 mm x 20 mm long synergy drug-eluting stent postdilated to 3.23 mm resulting reduction a 9% stenosis to 0% residual. He has done well since.  He underwent blepharoplasty last month successfully.  Over the last several weeks has noticed some discomfort in his left bicep and has occurred both at rest and with exertion.  These symptoms are similar to his preintervention symptoms 4 years ago.  He presents today for outpatient diagnostic coronary angiography to define his anatomy   PROCEDURE DESCRIPTION:  The patient was brought to the second floor [facility] cath lab in the postabsorptive state. He was premedicated with IV Versed and fentanyl. His right wrist was prepped and shaved in usual sterile fashion. Xylocaine 1% was used  for local anesthesia. A 6 French sheath was inserted into the right radial artery using standard Seldinger technique. The patient received 10,000 units  of heparin intravenously.  A 5 [place] TIG catheter and right Judkins catheter used for selective coronary angiography and left ventriculography respectively.  Isovue dye is used for the entirety of the case (80 cc of contrast total to patient).  Retrograde aortic, ventricular and pullback pressures were recorded.  Radial cocktail was administered via the SideArm sheath.  The left main appeared significant although the decision to recommend bypass surgery or PCI of the RCA hinged on whether it was truly anatomically significant and therefore I elected to perform IVUS interrogation.  The ACT was  measured at 468.  Using a 6 [place] XB LAD 3.0 cm guide catheter along with a 0.14 [place] guidewire and a intravascular ultrasound catheter I crossed the left main into the LAD and did automatic pullback.  The cross-sectional area of the ostium of the left main was 4.18 mm suggesting that it was significant.  Impression Mr. Bells has patent proximal overlapping LAD stents with the development of a significant distal left main lesion since I last Him in 2019.  He also has what appears to be a significant proximal dominant RCA stenosis with normal LV function.  His anatomy is suitable for CABG.  Given the fairly mild nature of his symptoms I feel comfortable discharging him home today.  We will discontinue his clopidogrel.  He will need a clopidogrel washout.  We will arrange for him to see cardiothoracic surgery early next week in anticipation of bypass surgery later in the week.  The radial sheath was removed and a TR band was placed on the right wrist to achieve patent hemostasis.  The patient left lab in stable condition.  Nanetta Batty. MD, [facility] 05/11/2021 1:51 PM  Findings Coronary Findings Diagnostic  Dominance: Right  Left Main Ost LM to Mid LM lesion is 70% stenosed.  Left Anterior Descending Previously placed Prox LAD to Mid LAD stent (unknown type) is  widely patent. Mid LAD lesion is 30% stenosed.  Right Coronary Artery Prox RCA lesion is 75% stenosed.  Intervention  No interventions have been documented.   CARDIAC CATHETERIZATION  CARDIAC CATHETERIZATION 04/11/2017  Narrative Images from the original result were not included.   Prox LAD lesion is 99% stenosed.  A stent was successfully placed.  Post intervention, there is a 0% residual stenosis.  The left ventricular systolic function is normal.  LV end diastolic pressure is normal.  The left ventricular ejection fraction is 55-65% by visual estimate.  Garrus Gauthreaux is a 78 y.o. male   [id number] LOCATION:   FACILITY: [facility] PHYSICIAN: Nanetta Batty, M.D. 1945-07-31   DATE OF PROCEDURE:  04/11/2017  DATE OF DISCHARGE:     CARDIAC CATHETERIZATION / PCI DES Prox LAD    History obtained from chart review.Hiroto Saltzman is a 78 y.o. mildly overweight married Caucasian male, father of 2 children (1 living), who I saw  03/07/16. 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 His problems include obstructive sleep apnea, on CPAP, hypertension, and hyperlipidemia. He denies chest pain or shortness of breath. He had a non-ST-segment-elevation myocardial infarction, July 02, 2007. He underwent PCI and stenting of his proximal LAD with a Taxus Liberte drug-eluting stent. He had also had a 60% right renal artery stenosis documented angiographically at that time, which we  have been following by duplex ultrasound. Since I saw him back a year ago he denies chest pain or shortness of breath but has noticed some left upper extremity discomfort with exertion similar to his pre-MI symptoms. . Recent lipid profile performed by his PCP 06/21/16 revealing a total cholesterol 113, LDL 48 and HDL of 35.  A  Myoview stress test was high risk with ischemia in the LAD territory suggesting potential restenosis within the LAD stent. He presents now for outpatient diagnostic coronary angiography to define his anatomy.     PROCEDURE DESCRIPTION:  The patient was brought to the second floor [facility]  Cardiac cath lab in the postabsorptive state. He was premedicated with Valium 5 mg by mouth, IV Versed and fentanyl. His right wrist was prepped and shaved in usual sterile fashion. Xylocaine 1% was used for local anesthesia. A 6 French sheath was inserted into the right radial artery using standard Seldinger technique. The patient received 5000 units  of heparin  intravenously.  A 5 [place] TIG catheter and pigtail catheters were used for  selective coronary angiography and left ventriculography respectively. Isovue dye was used for the entirety of the case. Retrograde aortic, left ventricular and pullback pressures were recorded. Radial cocktail was administered via the SideArm sheath.  The patient was already on aspirin and Plavix. He received Angiomax bolus followed by infusion with a therapeutic ACT. Using a 6 [place] XB LAD 3.5 cm guide catheter along with a 0.140 Pro water guidewire and a 2 mm x 12 mm balloon the proximal LAD stent was predilated. Following this I carefully positioned a 3 mm x 20 mm long synergy drug-eluting stent within the previously stented segment and deployed at 16 atm (3.23 mm) resulting in reduction of a 90% stenosis to 0% residual. I did give 200 g of intra-coronary nitroglycerin. There were no hemodynamic or echocardiographic sequela. The patient tolerated the procedure well. The guidewire and catheter were removed. The sheath was removed and a TR band was placed on the right wrist to achieve patent hemostasis. The patient left the lab in stable condition.  Impression Successful proximal LAD PCI and drug-eluting stent for in-stent restenosis within a previously placed LAD stent April 2009. The patient had an excellent result. He had already been on antiplatelet therapy. He will continue this indefinitely. He'll be hydrated overnight and discharged home in the morning. I will see him back in the office in 2-3 weeks.  Nanetta Batty. MD, [facility] 04/11/2017 4:29 PM  Findings Coronary Findings Diagnostic  Dominance: Right  Left Anterior Descending Prox LAD lesion is 99% stenosed. The lesion was previously treated.  Intervention  Prox LAD lesion Stent A stent was successfully placed. Post-Intervention Lesion Assessment There is a 0% residual stenosis post intervention.   STRESS TESTS  MYOCARDIAL PERFUSION IMAGING 05/28/2023  Narrative   Findings are consistent with no ischemia and no infarction on  perfusion images. The study is intermediate risk due to presence of transient ischemic dilation, cannot exclude balanced ischemia. If symptoms persist, consider alternate ischemic evaluation (PET CT with MBFR vs cath).   No ST deviation was noted.   LV perfusion is normal. There is no evidence of ischemia. There is no evidence of infarction.   Left ventricular function is normal. Nuclear stress EF: 59%. The left ventricular ejection fraction is normal (55-65%). End diastolic cavity size is normal. End systolic cavity size is normal. Evidence of transient ischemic dilation (TID) noted. TID was appreciated visually and quantitatively.   Prior study  available for comparison from 04/04/2017. There are changes in perfusion compared to prior study which appear to be improved.   ECHOCARDIOGRAM  ECHOCARDIOGRAM COMPLETE 06/20/2023  Narrative ECHOCARDIOGRAM REPORT    Patient Name:   COMMODORE BELLEW Date of Exam: 06/20/2023 Medical Rec #:  [id number]    Height:       71.0 in Accession #:    [id number]   Weight:       214.0 lb Date of Birth:  [birth year]    BSA:          2.170 m Patient Age:    77 years     BP:           137/63 mmHg Patient Gender: M            HR:           67 bpm. Exam Location:  Inpatient  Procedure: 2D Echo, Cardiac Doppler and Color Doppler (Both Spectral and Color Flow Doppler were utilized during procedure).  Indications:    Stroke  History:        Patient has prior history of Echocardiogram examinations, most recent 05/11/2021. Prior CABG; Risk Factors:Diabetes and Hypertension.  Sonographer:    Darlys Gales Referring Phys: Briscoe Burns  IMPRESSIONS   1. Left ventricular ejection fraction, by estimation, is 60 to 65%. The left ventricle has normal function. The left ventricle has no regional wall motion abnormalities. Left ventricular diastolic parameters are consistent with Grade I diastolic dysfunction (impaired relaxation). 2. Right ventricular systolic function is  normal. The right ventricular size is normal. 3. The mitral valve is normal in structure. No evidence of mitral valve regurgitation. No evidence of mitral stenosis. 4. The aortic valve is normal in structure. Aortic valve regurgitation is not visualized. No aortic stenosis is present. 5. The inferior vena cava is normal in size with greater than 50% respiratory variability, suggesting right atrial pressure of 3 mmHg.  FINDINGS Left Ventricle: Left ventricular ejection fraction, by estimation, is 60 to 65%. The left ventricle has normal function. The left ventricle has no regional wall motion abnormalities. The left ventricular internal cavity size was normal in size. There is no left ventricular hypertrophy. Left ventricular diastolic parameters are consistent with Grade I diastolic dysfunction (impaired relaxation).  Right Ventricle: The right ventricular size is normal. No increase in right ventricular wall thickness. Right ventricular systolic function is normal.  Left Atrium: Left atrial size was normal in size.  Right Atrium: Right atrial size was normal in size.  Pericardium: There is no evidence of pericardial effusion.  Mitral Valve: The mitral valve is normal in structure. No evidence of mitral valve regurgitation. No evidence of mitral valve stenosis.  Tricuspid Valve: The tricuspid valve is normal in structure. Tricuspid valve regurgitation is not demonstrated. No evidence of tricuspid stenosis.  Aortic Valve: The aortic valve is normal in structure. Aortic valve regurgitation is not visualized. No aortic stenosis is present. Aortic valve mean gradient measures 6.0 mmHg. Aortic valve peak gradient measures 8.9 mmHg. Aortic valve area, by VTI measures 2.34 cm.  Pulmonic Valve: The pulmonic valve was normal in structure. Pulmonic valve regurgitation is trivial. No evidence of pulmonic stenosis.  Aorta: The aortic root is normal in size and structure.  Venous: The inferior vena  cava is normal in size with greater than 50% respiratory variability, suggesting right atrial pressure of 3 mmHg.  IAS/Shunts: No atrial level shunt detected by color flow Doppler.   LEFT VENTRICLE PLAX 2D LVIDd:  4.50 cm   Diastology LVIDs:         3.10 cm   LV e' medial:    7.18 cm/s LV PW:         0.90 cm   LV E/e' medial:  9.8 LV IVS:        1.00 cm   LV e' lateral:   13.40 cm/s LVOT diam:     2.00 cm   LV E/e' lateral: 5.2 LV SV:         80 LV SV Index:   37 LVOT Area:     3.14 cm   RIGHT VENTRICLE RV S prime:     13.30 cm/s TAPSE (M-mode): 1.4 cm  LEFT ATRIUM             Index        RIGHT ATRIUM           Index LA Vol (A2C):   38.5 ml 17.74 ml/m  RA Area:     13.60 cm LA Vol (A4C):   35.6 ml 16.40 ml/m  RA Volume:   28.10 ml  12.95 ml/m LA Biplane Vol: 38.2 ml 17.60 ml/m AORTIC VALVE AV Area (Vmax):    2.38 cm AV Area (Vmean):   2.23 cm AV Area (VTI):     2.34 cm AV Vmax:           149.00 cm/s AV Vmean:          116.000 cm/s AV VTI:            0.341 m AV Peak Grad:      8.9 mmHg AV Mean Grad:      6.0 mmHg LVOT Vmax:         113.00 cm/s LVOT Vmean:        82.200 cm/s LVOT VTI:          0.254 m LVOT/AV VTI ratio: 0.74  AORTA Ao Root diam: 3.20 cm  MITRAL VALVE MV Area (PHT): 3.17 cm    SHUNTS MV Decel Time: 239 msec    Systemic VTI:  0.25 m MV E velocity: 70.20 cm/s  Systemic Diam: 2.00 cm MV A velocity: 96.90 cm/s MV E/A ratio:  0.72  Donato Schultz MD Electronically signed by Donato Schultz MD Signature Date/Time: 06/20/2023/3:20:02 PM    Final   TEE  ECHO INTRAOPERATIVE TEE 05/17/2021  Narrative *INTRAOPERATIVE TRANSESOPHAGEAL REPORT *    Patient Name:   Torie Blossom Hoops Date of Exam: 05/17/2021 Medical Rec #:  [id number]    Height:       71.0 in Accession #:    [id number]   Weight:       212.0 lb Date of Birth:  11-Jan-1946    BSA:          2.16 m Patient Age:    75 years     BP:           122/52 mmHg Patient Gender: M            HR:            53 bpm. Exam Location:  Inpatient  Transesophogeal exam was perform intraoperatively during surgical procedure. Patient was closely monitored under general anesthesia during the entirety of examination.  Indications:     I25.110 Atherosclerotic heart disease of native coronary artery with unstable angina pectoris Performing Phys: [id number] Eliezer Lofts LIGHTFOOT Diagnosing Phys: Dorris Singh MD  Complications: No known complications during this procedure. POST-OP IMPRESSIONS _ Left Ventricle:  Post Bypass The patient came off bypass on the initial attempt. Left ventricular contraction did improve with volume replaccement. There were no significant changes from preop exam. The TEE that had been placed afetr induction uneventfully was removed at the end of the exam without difficulty. The patient was later takento the SICU in stable conditions.  PRE-OP FINDINGS Left Ventricle: The left ventricle has normal systolic function, with an ejection fraction of 55-60%.   Right Ventricle: The right ventricle has normal systolic function.  Left Atrium: No left atrial/left atrial appendage thrombus was detected.  Mitral Valve: The mitral valve is normal in structure. Mitral valve regurgitation is mild by color flow Doppler.  Tricuspid Valve: The tricuspid valve was normal in structure.  Aortic Valve: The aortic valve is tricuspid.   Pulmonic Valve: The pulmonic valve was normal in structure.     Dorris Singh MD Electronically signed by Dorris Singh MD Signature Date/Time: 05/17/2021/6:13:19 PM   Final  MONITORS  LONG TERM MONITOR (3-14 DAYS) 11/13/2021  Narrative   7 day monitor   Rare supraventricular ectopy in the form of isolated PACs, couplets, triplets. Two runs of SVT longest 11 beats.   Rare ventricular ectopy in the form of isolated PVCs   No reported symptoms   Patch Wear Time:  7 days and 1 hours (2023-08-03T21:05:27-0400 to  2023-08-10T22:46:38-0400)  Patient had a min HR of 45 bpm, max HR of 135 bpm, and avg HR of 63 bpm. Predominant underlying rhythm was Sinus Rhythm. First Degree AV Block was present. 2 Supraventricular Tachycardia runs occurred, the run with the fastest interval lasting 4 beats with a max rate of 135 bpm, the longest lasting 11 beats with an avg rate of 106 bpm. Isolated SVEs were rare (<1.0%), SVE Couplets were rare (<1.0%), and SVE Triplets were rare (<1.0%). Isolated VEs were rare (<1.0%), and no VE Couplets or VE Triplets were present. Ventricular Bigeminy was present.       ______________________________________________________________________________________________       Current Reported Medications:.    Current Meds  Medication Sig   Alpha-Lipoic Acid 300 MG CAPS Take 300 mg by mouth in the morning and at bedtime.   apixaban (ELIQUIS) 5 MG TABS tablet Take 1 tablet (5 mg total) by mouth 2 (two) times daily.   Coenzyme Q10 (COQ-10 PO) Take 1 capsule by mouth every evening.   FARXIGA 10 MG TABS tablet Take 10 mg by mouth in the morning.   gentamicin cream (GARAMYCIN) 0.1 % Apply 1 Application topically 2 (two) times daily. (Patient taking differently: Apply 1 Application topically See admin instructions. Apply to affected toe (right big toe) 2 times a day as directed)   metoprolol tartrate (LOPRESSOR) 25 MG tablet Take 1/2 (one-half) tablet by mouth twice daily (Patient taking differently: Take 12.5 mg by mouth 2 (two) times daily.)   Multiple Vitamin (MULTIVITAMIN WITH MINERALS) TABS tablet Take 1 tablet by mouth 2 (two) times daily with a meal.   Multiple Vitamins-Minerals (PRESERVISION AREDS PO) Take 1 capsule by mouth 2 (two) times daily.   nitroGLYCERIN (NITROSTAT) 0.4 MG SL tablet DISSOLVE ONE TABLET UNDER THE TONGUE EVERY 5 MINUTES AS NEEDED FOR CHEST PAIN.  DO NOT EXCEED A TOTAL OF 3 DOSES IN 15 MINUTES   pregabalin (LYRICA) 100 MG capsule Take 100 mg by mouth 2 (two) times  daily.   PRESCRIPTION MEDICATION CPAP- At bedtime   ramipril (ALTACE) 10 MG capsule Take 1 capsule (10 mg total) by mouth daily.   rosuvastatin (  CRESTOR) 20 MG tablet TAKE 1 TABLET BY MOUTH THREE TIMES A WEEK (Patient taking differently: Take 20 mg by mouth every Monday, Wednesday, and Friday at 8 PM.)   sildenafil (VIAGRA) 100 MG tablet Take 100 mg by mouth daily as needed for erectile dysfunction.   TRULICITY 1.5 MG/0.5ML SOAJ Inject 1.5 mg into the skin every Monday.   vitamin B-12 (CYANOCOBALAMIN) 100 MCG tablet Take 100 mcg by mouth daily.   Physical Exam:    VS:  BP 122/60 (BP Location: Left Arm)   Pulse 68   Ht 5\' 11"  (1.803 m)   Wt 213 lb 12.8 oz (97 kg)   SpO2 95%   BMI 29.82 kg/m    Wt Readings from Last 3 Encounters:  06/25/23 213 lb 12.8 oz (97 kg)  06/19/23 214 lb (97.1 kg)  05/28/23 213 lb (96.6 kg)    GEN: Well nourished, well developed in no acute distress NECK: No JVD; No carotid bruits CARDIAC: RRR, no murmurs, rubs, gallops RESPIRATORY:  Clear to auscultation without rales, wheezing or rhonchi  ABDOMEN: Soft, non-tender, non-distended EXTREMITIES:  No edema; No acute deformity     Asessement and Plan:.    CAD/PAF: Status post NSTEMI in 2009 with stent to proximal LAD. In 2019 following an abnormal Myoview stress test he underwent repeat cath revealing 99% in-stent restenosis of his proximal LAD, restented with 3 mm x 20 mm DES. Since 04/2021 patient had recurrent chest pain revealing a 70% ostial left main stenosis and 75% proximal RCA stenosis. He underwent CABG with LIMA to LAD, vein to obtuse marginal branch and PDA with Dr. Cliffton Asters on 05/17/2021. MPI on findings were consistent with no ischemia no infarction on perfusion can images this study was however considered to be intermediate risk due to presence of transient ischemic dilation, unable to exclude balanced ischemia.  Reviewed with Dr. Allyson Sabal who recommended a trial of Imdur, prior to initiation patient  reported sensation had resolved, not started on Imdur. Today patient reports he is doing well overall, he denies any chest pain or shortness of breath. Heart healthy diet and regular cardiovascular exercise encouraged.  Reviewed ED precautions.  Continue Eliquis 5 mg twice daily, metoprolol tartrate 25 mg twice daily, ramipril 10 mg daily, Crestor 20 mg 3 times a week.  CVA: On 06/19/2023 patient presented the ED for evaluation of right hand numbness.  Brain MRI demonstrated a small acute infarct in the left precentral gyrus.  Neurology felt that given history of transient atrial fibrillation in setting of CABG it was likely stroke was cardioembolic from paroxysmal atrial fibrillation.  Patient started on Eliquis 5 mg twice daily per neurology. Patient denies any bleeding problems. Notes only residual is some slight weakness in the right hand. Will have patient wear one month cardiac monitor to determine burden. Following with neurology.   Hypertension: Blood pressure today 122/60. Continue current antihypertensive regimen.  Hyperlipidemia: Last lipid profile in 04/22/2023 indicated total cholesterol 125, HDL 45 and LDL 59.  Continue Crestor 20 mg daily.  Renal artery stenosis: History of 60% right renal artery stenosis at time of cath in 2009.  Per Dr. Hazle Coca note serial surveillance renal Dopplers showed no evidence of significant progression.    Disposition: F/u with Dr. Allyson Sabal or Reather Littler, NP in three months.   Signed, Rip Harbour, NP

## 2023-06-20 NOTE — Telephone Encounter (Signed)
-----   Message from Rip Harbour sent at 06/20/2023  8:20 AM EDT ----- Please let Justin Carr know that his carotid ultrasound showed mild plaque in bilateral carotid arteries, no significant change compared to 2023. Good results! Follow up as planned.

## 2023-06-20 NOTE — Plan of Care (Signed)
  Problem: Coping: Goal: Ability to adjust to condition or change in health will improve Outcome: Progressing   Problem: Fluid Volume: Goal: Ability to maintain a balanced intake and output will improve Outcome: Progressing   Problem: Health Behavior/Discharge Planning: Goal: Ability to manage health-related needs will improve Outcome: Progressing   Problem: Metabolic: Goal: Ability to maintain appropriate glucose levels will improve Outcome: Progressing   Problem: Nutritional: Goal: Maintenance of adequate nutrition will improve Outcome: Progressing   Problem: Skin Integrity: Goal: Risk for impaired skin integrity will decrease Outcome: Progressing   Problem: Education: Goal: Knowledge of disease or condition will improve Outcome: Progressing

## 2023-06-20 NOTE — Telephone Encounter (Signed)
 Called patient advised of below they verbalized understanding.

## 2023-06-20 NOTE — Progress Notes (Addendum)
 STROKE TEAM PROGRESS NOTE    INTERIM HISTORY/SUBJECTIVE  No family at the bedside. Patient sitting in the chair in NAD  MRI  Small acute infarct in the left precentral gyrus  Patient has prior history of transient postoperative A-fib following CABG and was given Eliquis briefly. OBJECTIVE  CBC    Component Value Date/Time   WBC 5.6 06/19/2023 1036   RBC 4.94 06/19/2023 1036   HGB 16.3 06/19/2023 1036   HGB 12.3 (L) 06/13/2021 0856   HCT 49.2 06/19/2023 1036   HCT 36.1 (L) 06/13/2021 0856   PLT 136 (L) 06/19/2023 1036   PLT 253 06/13/2021 0856   MCV 99.6 06/19/2023 1036   MCV 94 06/13/2021 0856   MCH 33.0 06/19/2023 1036   MCHC 33.1 06/19/2023 1036   RDW 13.5 06/19/2023 1036   RDW 12.8 06/13/2021 0856   LYMPHSABS 1.6 06/19/2023 1036   LYMPHSABS 2.9 04/05/2017 1539   MONOABS 0.6 06/19/2023 1036   EOSABS 0.2 06/19/2023 1036   EOSABS 0.3 04/05/2017 1539   BASOSABS 0.0 06/19/2023 1036   BASOSABS 0.0 04/05/2017 1539    BMET    Component Value Date/Time   NA 140 06/20/2023 0649   NA 139 06/19/2021 1508   K 4.3 06/20/2023 0649   CL 106 06/20/2023 0649   CO2 25 06/20/2023 0649   GLUCOSE 106 (H) 06/20/2023 0649   BUN 17 06/20/2023 0649   BUN 12 06/19/2021 1508   CREATININE 1.00 06/20/2023 0649   CALCIUM 8.9 06/20/2023 0649   EGFR 71 06/19/2021 1508   GFRNONAA >60 06/20/2023 0649    IMAGING past 24 hours CT ANGIO HEAD NECK W WO CM Result Date: 06/19/2023 CLINICAL DATA:  Stroke, determine embolic source EXAM: CT ANGIOGRAPHY HEAD AND NECK WITH AND WITHOUT CONTRAST TECHNIQUE: Multidetector CT imaging of the head and neck was performed using the standard protocol during bolus administration of intravenous contrast. Multiplanar CT image reconstructions and MIPs were obtained to evaluate the vascular anatomy. Carotid stenosis measurements (when applicable) are obtained utilizing NASCET criteria, using the distal internal carotid diameter as the denominator. RADIATION DOSE  REDUCTION: This exam was performed according to the departmental dose-optimization program which includes automated exposure control, adjustment of the mA and/or kV according to patient size and/or use of iterative reconstruction technique. CONTRAST:  75mL OMNIPAQUE IOHEXOL 350 MG/ML SOLN COMPARISON:  Brain MRI from earlier today FINDINGS: CTA NECK FINDINGS Aortic arch: Limited coverage showing atheromatous plaque. No acute finding. Right carotid system: Diffuse atheromatous wall thickening of the common carotid with mixed density plaque heavily deposited at the bifurcation. No flow reducing stenosis or ulceration. Left carotid system: Diffuse atheromatous wall thickening of the common carotid with mixed density plaque at the bifurcation not causing flow reducing stenosis. No ulceration or beading Vertebral arteries: Proximal subclavian atherosclerosis. Despite the atherosclerosis it is a kink that is associated with approximately 50% narrowing of the proximal right vertebral artery. Atheromatous plaque at both vertebral origins without flow reducing stenosis by coronal reformats. No dissection or beading. Skeleton: No acute or aggressive finding Other neck: No acute finding Upper chest: Clear apical lungs Review of the MIP images confirms the above findings CTA HEAD FINDINGS Anterior circulation: Atheromatous calcification of the cavernous carotids. No branch occlusion, beading, or aneurysm. Posterior circulation: The vertebral and basilar arteries are smoothly contoured and diffusely patent. No branch occlusion, beading, or aneurysm Venous sinuses: Unremarkable Anatomic variants: None significant Review of the MIP images confirms the above findings IMPRESSION: No emergent finding. Atherosclerosis. No ulceration  or flow reducing stenosis seen underlying the patient's acute infarcts. Electronically Signed   By: Tiburcio Pea M.D.   On: 06/19/2023 18:44   MR BRAIN WO CONTRAST Result Date: 06/19/2023 CLINICAL  DATA:  Neuro deficit, acute, stroke suspected. Right hand weakness beginning yesterday evening. EXAM: MRI HEAD WITHOUT CONTRAST TECHNIQUE: Multiplanar, multiecho pulse sequences of the brain and surrounding structures were obtained without intravenous contrast. COMPARISON:  Head CT 06/19/2023 and MRI 09/05/2019 FINDINGS: Brain: There is a small acute infarct involving the left precentral gyrus in the hand motor region. No intracranial hemorrhage, mass, midline shift, or extra-axial fluid collection is identified. A few punctate T2 hyperintensities in the cerebral white matter are within normal limits for age. Cerebral volume is normal with normal size of the ventricles. Vascular: Major intracranial vascular flow voids are preserved. Skull and upper cervical spine: Unremarkable bone marrow signal. Sinuses/Orbits: Bilateral cataract extraction. Mucous retention cyst in the left maxillary sinus. Clear mastoid air cells. Other: None. IMPRESSION: Small acute infarct in the left precentral gyrus. Electronically Signed   By: Sebastian Ache M.D.   On: 06/19/2023 15:01   CT HEAD WO CONTRAST Result Date: 06/19/2023 CLINICAL DATA:  Right hand weakness. EXAM: CT HEAD WITHOUT CONTRAST TECHNIQUE: Contiguous axial images were obtained from the base of the skull through the vertex without intravenous contrast. RADIATION DOSE REDUCTION: This exam was performed according to the departmental dose-optimization program which includes automated exposure control, adjustment of the mA and/or kV according to patient size and/or use of iterative reconstruction technique. COMPARISON:  July 04, 2007 FINDINGS: Brain: No evidence of acute infarction, hemorrhage, hydrocephalus, extra-axial collection or mass lesion/mass effect. Vascular: Moderate to marked severity bilateral cavernous carotid artery calcification is noted. Skull: Normal. Negative for fracture or focal lesion. Sinuses/Orbits: No acute finding. Other: None. IMPRESSION: No acute  intracranial abnormality. Electronically Signed   By: Aram Candela M.D.   On: 06/19/2023 11:47    Vitals:   06/19/23 2309 06/20/23 0319 06/20/23 0556 06/20/23 0733  BP: 134/60 (!) 150/70 (!) 142/67 (!) 143/59  Pulse: (!) 56 (!) 57 62 (!) 55  Resp: 16 15 16 18   Temp: 97.8 F (36.6 C) 97.9 F (36.6 C) 97.9 F (36.6 C) 97.7 F (36.5 C)  TempSrc: Oral Oral Oral Oral  SpO2: 94% 100% 97% 99%  Weight:      Height:         PHYSICAL EXAM General:  Alert, well-nourished, well-developed pleasant elderly Caucasian male in no acute distress Psych:  Mood and affect appropriate for situation CV: Regular rate and rhythm on monitor Respiratory:  Regular, unlabored respirations on room air GI: Abdomen soft and nontender   NEURO:  Mental Status: AA&Ox3, patient is able to give clear and coherent history Speech/Language: speech is without dysarthria or aphasia.  Naming, repetition, fluency, and comprehension intact.  Cranial Nerves:  II: PERRL. Visual fields full.  III, IV, VI: EOMI. Eyelids elevate symmetrically.  V: Sensation is intact to light touch and symmetrical to face.  VII: Face is symmetrical resting and smiling VIII: hearing intact to voice. IX, X: Palate elevates symmetrically. Phonation is normal.  RU:[id number] shrug 5/5. XII: tongue is midline without fasciculations. Motor: 5/5 in all 4 extremities .  Trace right hand weakness.  Tone: is normal and bulk is normal Sensation- Intact to light touch bilaterally. Extinction absent to light touch to DSS.  Decreased fine motor skills on left  Coordination: FTN intact bilaterally, HKS: no ataxia in BLE.No drift.  Gait- deferred  ASSESSMENT/PLAN  Justin Carr is a 78 y.o. male with history of T2DM with neuropathy, HTN, HLD, OSA, CAD s/p CABG x3, renal artery stenosis, A fib setting of CABG and eventually taken off of Eliquis in presenting to the ED with right hand numbness.  NIH on Admission 0  Acute Ischemic Infarct:   left precentral gyrus  Etiology:  likely cardioembolic   CT head No acute abnormality.  CTA head & neck NO LVO  MRI  Small acute infarct in the left precentral gyrus  2D Echo EF 60-65%. LV with grade I diastolic dysfunction  LDL 61 HgbA1c 5.9 VTE prophylaxis - Lovenox  aspirin 81 mg daily prior to admission, now on aspirin 81 mg daily and clopidogrel 75 mg daily. Recommend starting Eliquis prior history of postop A-fib following CABG Therapy recommendations:  Pending Disposition:  pending   Atrial fibrillation, hx  Home Meds: none Continue telemetry monitoring setting of CABG and eventually taken off of Eliquis Begin anticoagulation with Eliquis    Hypertension CAD Home meds:  lopressor 25mg , ramipril 10mg  Stable Blood Pressure Goal: BP less than 220/110   Hyperlipidemia Home meds:  crestor 20mg ,  resumed in hospital LDL 61, goal < 70 Continue statin at discharge  Dysphagia Patient has post-stroke dysphagia, SLP consulted    Diet   Diet heart healthy/carb modified Room service appropriate? Yes; Fluid consistency: Thin   Advance diet as tolerated  Other Stroke Risk Factors  ETOH use, alcohol level <10, advised to drink no more than 2 drink(s) a day CAD   Other Active Problems GERD  Hospital day # 0  Gevena Mart DNP, ACNPC-AG  [facility]  I have personally obtained history,examined this patient, reviewed notes, independently viewed imaging studies, participated in medical decision making and plan of care.ROS completed by me personally and pertinent positives fully documented  I have made any additions or clarifications directly to the above note. Agree with note above.  Patient presented with right hand weakness secondary to embolic left frontal MCA branch infarct.  Given prior history of transient postop A-fib following CABG this is likely cardioembolic from paroxysmal A-fib.  Discussed risk-benefit and alternatives of anticoagulation with Eliquis with  patient and answered questions.  He is agreeable to starting Eliquis.  Keep scheduled follow-up with cardiology and advised him to discuss  this with them as well.  Aggressive risk factor modification.  Greater than 50% time during this 50-minute visit was spent in counseling and coordination of care and discussion patient care team and answering questions.  Discussed with Justin Carr.  Follow-up as an outpatient stroke clinic in 2 months  Justin Heady, MD Medical Director [facility] Pager: [phone] 06/20/2023 5:23 PM   To contact Stroke Continuity provider, please refer to [email or web address]. After hours, contact General Neurology

## 2023-06-20 NOTE — Progress Notes (Signed)
   06/20/23 0600  Provider Notification  Provider Name/Title Dr Derry Lory  Date Provider Notified 06/20/23  Time Provider Notified 0603  Method of Notification Page  Notification Reason Other (Comment) (Pt c/o of new numbness in both arms)  Provider response No new orders  Date of Provider Response 06/20/23  Time of Provider Response [phone]

## 2023-06-20 NOTE — Care Management Obs Status (Signed)
 MEDICARE OBSERVATION STATUS NOTIFICATION   Patient Details  Name: EISSA BUCHBERGER MRN: [id number] Date of Birth: 01-31-1946   Medicare Observation Status Notification Given:  Yes    Kermit Balo, RN 06/20/2023, 12:56 PM

## 2023-06-20 NOTE — Progress Notes (Signed)
 PT Cancellation Note  Patient Details Name: Justin Carr MRN: [id number] DOB: June 28, 1945   Cancelled Treatment:    Reason Eval/Treat Not Completed: OT screened, no needs identified, will sign off  Harrel Carina, DPT, CLT  Acute Rehabilitation Services Office: [phone] (Secure chat preferred)   Claudia Desanctis 06/20/2023, 10:16 AM

## 2023-06-23 ENCOUNTER — Emergency Department (HOSPITAL_COMMUNITY)
Admission: EM | Admit: 2023-06-23 | Discharge: 2023-06-23 | Disposition: A | Discharge status: Home / Self Care | Attending: Emergency Medicine | Admitting: Emergency Medicine

## 2023-06-23 ENCOUNTER — Emergency Department (HOSPITAL_COMMUNITY)

## 2023-06-23 DIAGNOSIS — Z79899 Other long term (current) drug therapy: Secondary | ICD-10-CM | POA: Diagnosis not present

## 2023-06-23 DIAGNOSIS — I2510 Atherosclerotic heart disease of native coronary artery without angina pectoris: Secondary | ICD-10-CM | POA: Insufficient documentation

## 2023-06-23 DIAGNOSIS — R202 Paresthesia of skin: Secondary | ICD-10-CM | POA: Insufficient documentation

## 2023-06-23 DIAGNOSIS — I10 Essential (primary) hypertension: Secondary | ICD-10-CM | POA: Diagnosis not present

## 2023-06-23 DIAGNOSIS — Z951 Presence of aortocoronary bypass graft: Secondary | ICD-10-CM | POA: Diagnosis not present

## 2023-06-23 DIAGNOSIS — Z7901 Long term (current) use of anticoagulants: Secondary | ICD-10-CM | POA: Diagnosis not present

## 2023-06-23 DIAGNOSIS — R531 Weakness: Secondary | ICD-10-CM | POA: Insufficient documentation

## 2023-06-23 DIAGNOSIS — Z91010 Allergy to peanuts: Secondary | ICD-10-CM | POA: Insufficient documentation

## 2023-06-23 DIAGNOSIS — G459 Transient cerebral ischemic attack, unspecified: Secondary | ICD-10-CM | POA: Diagnosis not present

## 2023-06-23 DIAGNOSIS — J341 Cyst and mucocele of nose and nasal sinus: Secondary | ICD-10-CM | POA: Diagnosis not present

## 2023-06-23 DIAGNOSIS — R200 Anesthesia of skin: Secondary | ICD-10-CM | POA: Diagnosis not present

## 2023-06-23 LAB — I-STAT CHEM 8, ED
BUN: 17 mg/dL (ref 8–23)
Calcium, Ion: 1.26 mmol/L (ref 1.15–1.40)
Chloride: 102 mmol/L (ref 98–111)
Creatinine, Ser: 1.2 mg/dL (ref 0.61–1.24)
Glucose, Bld: 83 mg/dL (ref 70–99)
HCT: 46 % (ref 39.0–52.0)
Hemoglobin: 15.6 g/dL (ref 13.0–17.0)
Potassium: 4.6 mmol/L (ref 3.5–5.1)
Sodium: 139 mmol/L (ref 135–145)
TCO2: 27 mmol/L (ref 22–32)

## 2023-06-23 LAB — COMPREHENSIVE METABOLIC PANEL WITH GFR
ALT: 39 U/L (ref 0–44)
AST: 26 U/L (ref 15–41)
Albumin: 3.9 g/dL (ref 3.5–5.0)
Alkaline Phosphatase: 62 U/L (ref 38–126)
Anion gap: 6 (ref 5–15)
BUN: 18 mg/dL (ref 8–23)
CO2: 28 mmol/L (ref 22–32)
Calcium: 9.4 mg/dL (ref 8.9–10.3)
Chloride: 105 mmol/L (ref 98–111)
Creatinine, Ser: 1.03 mg/dL (ref 0.61–1.24)
GFR, Estimated: >60 mL/min (ref >60)
Glucose, Bld: 86 mg/dL (ref 70–99)
Potassium: 4.5 mmol/L (ref 3.5–5.1)
Sodium: 139 mmol/L (ref 135–145)
Total Bilirubin: 0.8 mg/dL (ref 0.0–1.2)
Total Protein: 6.8 g/dL (ref 6.5–8.1)

## 2023-06-23 LAB — CBC
HCT: 48.7 % (ref 39.0–52.0)
Hemoglobin: 16.7 g/dL (ref 13.0–17.0)
MCH: 33.7 pg (ref 26.0–34.0)
MCHC: 34.3 g/dL (ref 30.0–36.0)
MCV: 98.2 fL (ref 80.0–100.0)
Platelets: 143 10*3/uL — ABNORMAL LOW (ref 150–400)
RBC: 4.96 MIL/uL (ref 4.22–5.81)
RDW: 13.4 % (ref 11.5–15.5)
WBC: 7.3 10*3/uL (ref 4.0–10.5)
nRBC: 0 % (ref 0.0–0.2)

## 2023-06-23 LAB — PROTIME-INR
INR: 1.1 (ref 0.8–1.2)
Prothrombin Time: 14.5 s (ref 11.4–15.2)

## 2023-06-23 LAB — DIFFERENTIAL
Abs Immature Granulocytes: 0.02 10*3/uL (ref 0.00–0.07)
Basophils Absolute: 0 10*3/uL (ref 0.0–0.1)
Basophils Relative: 1 %
Eosinophils Absolute: 0.2 10*3/uL (ref 0.0–0.5)
Eosinophils Relative: 2 %
Immature Granulocytes: 0 %
Lymphocytes Relative: 29 %
Lymphs Abs: 2.1 10*3/uL (ref 0.7–4.0)
Monocytes Absolute: 0.8 10*3/uL (ref 0.1–1.0)
Monocytes Relative: 11 %
Neutro Abs: 4.2 10*3/uL (ref 1.7–7.7)
Neutrophils Relative %: 57 %

## 2023-06-23 LAB — CBG MONITORING, ED: Glucose-Capillary: 98 mg/dL (ref 70–99)

## 2023-06-23 LAB — APTT: aPTT: 35 s (ref 24–36)

## 2023-06-23 LAB — ETHANOL: Alcohol, Ethyl (B): <10 mg/dL (ref <10)

## 2023-06-23 MED ORDER — SODIUM CHLORIDE 0.9% FLUSH
3.0000 mL | Freq: Once | INTRAVENOUS | Status: AC
  Administered 2023-06-23: 3 mL via INTRAVENOUS

## 2023-06-23 NOTE — ED Provider Notes (Signed)
 [facility] Provider Note   CSN: [id number] Arrival date & time: 06/23/23  1212     History {Add pertinent medical, surgical, social history, OB history to HPI:1} Chief Complaint  Patient presents with   Transient Ischemic Attack   Numbness    Justin Carr is a 78 y.o. male.  He has a prior history of coronary disease, CABG, paroxysmal A-fib.  He was admitted 4 days ago for right hand clumsiness and found to have stroke on MRI.  Started on Eliquis.  Has been compliant with treatment.  Today while he was at church around 1030 this morning he noticed a warm sensation up his left arm possibly with some numbness that extended into his left face.  Symptoms lasted about 90 minutes to 2 hours.  Drove himself here.  His symptoms are fully resolved now.  No blurry vision double vision weakness difficulty with balance.  The history is provided by the patient.  Cerebrovascular Accident This is a new problem. The current episode started 3 to 5 hours ago. The problem occurs constantly. The problem has been resolved. Pertinent negatives include no chest pain, no abdominal pain, no headaches and no shortness of breath. Nothing aggravates the symptoms. Nothing relieves the symptoms. He has tried nothing for the symptoms. The treatment provided significant relief.       Home Medications Prior to Admission medications   Medication Sig Start Date End Date Taking? Authorizing Provider  Alpha-Lipoic Acid 300 MG CAPS Take 300 mg by mouth in the morning and at bedtime.    [provider]  apixaban (ELIQUIS) 5 MG TABS tablet Take 1 tablet (5 mg total) by mouth 2 (two) times daily. 06/20/23 07/20/23  Hughie Closs, MD  Coenzyme Q10 (COQ-10 PO) Take 1 capsule by mouth every evening.    [provider]  doxycycline (VIBRA-TABS) 100 MG tablet Take 1 tablet (100 mg total) by mouth 2 (two) times daily. 06/13/23   Lenn Sink, DPM  FARXIGA 10 MG TABS  tablet Take 10 mg by mouth in the morning.    [provider]  gentamicin cream (GARAMYCIN) 0.1 % Apply 1 Application topically 2 (two) times daily. Patient taking differently: Apply 1 Application topically See admin instructions. Apply to affected toe (right big toe) 2 times a day as directed 06/05/23   Felecia Shelling, DPM  metoprolol tartrate (LOPRESSOR) 25 MG tablet Take 1/2 (one-half) tablet by mouth twice daily Patient taking differently: Take 12.5 mg by mouth 2 (two) times daily. 05/06/23   Runell Gess, MD  Multiple Vitamin (MULTIVITAMIN WITH MINERALS) TABS tablet Take 1 tablet by mouth 2 (two) times daily with a meal.    [provider]  Multiple Vitamins-Minerals (PRESERVISION AREDS PO) Take 1 capsule by mouth 2 (two) times daily.    [provider]  nitroGLYCERIN (NITROSTAT) 0.4 MG SL tablet DISSOLVE ONE TABLET UNDER THE TONGUE EVERY 5 MINUTES AS NEEDED FOR CHEST PAIN.  DO NOT EXCEED A TOTAL OF 3 DOSES IN 15 MINUTES 11/28/20   Runell Gess, MD  pregabalin (LYRICA) 100 MG capsule Take 100 mg by mouth 2 (two) times daily. 05/12/19   [provider]  PRESCRIPTION MEDICATION CPAP- At bedtime    [provider]  ramipril (ALTACE) 10 MG capsule Take 1 capsule (10 mg total) by mouth daily. 06/22/23   Hughie Closs, MD  rosuvastatin (CRESTOR) 20 MG tablet TAKE 1 TABLET BY MOUTH THREE TIMES A WEEK  Patient taking differently: Take 20 mg by mouth every Monday, Wednesday, and Friday at 8 PM. 03/18/23   Runell Gess, MD  sildenafil (VIAGRA) 100 MG tablet Take 100 mg by mouth daily as needed for erectile dysfunction.    [provider]  TRULICITY 1.5 MG/0.5ML SOAJ Inject 1.5 mg into the skin every Monday. 05/25/23   [provider]      Allergies    Peanuts [peanut oil], Lipitor [atorvastatin], and Rosuvastatin    Review of Systems   Review of Systems  Constitutional:  Negative for fever.  Eyes:  Negative for visual disturbance.   Respiratory:  Negative for shortness of breath.   Cardiovascular:  Negative for chest pain.  Gastrointestinal:  Negative for abdominal pain.  Neurological:  Negative for headaches.    Physical Exam Updated Vital Signs BP (!) 128/106 (BP Location: Left Arm)   Pulse (!) 59   Temp 98.5 F (36.9 C)   Resp 16   SpO2 98%  Physical Exam Vitals and nursing note reviewed.  Constitutional:      General: He is not in acute distress.    Appearance: Normal appearance. He is well-developed.  HENT:     Head: Normocephalic and atraumatic.  Eyes:     Conjunctiva/sclera: Conjunctivae normal.  Cardiovascular:     Rate and Rhythm: Normal rate and regular rhythm.     Heart sounds: No murmur heard. Pulmonary:     Effort: Pulmonary effort is normal. No respiratory distress.     Breath sounds: Normal breath sounds.  Abdominal:     Palpations: Abdomen is soft.     Tenderness: There is no abdominal tenderness.  Musculoskeletal:        General: No deformity. Normal range of motion.     Cervical back: Neck supple.  Skin:    General: Skin is warm and dry.     Capillary Refill: Capillary refill takes less than 2 seconds.  Neurological:     General: No focal deficit present.     Mental Status: He is alert and oriented to person, place, and time.     Cranial Nerves: No cranial nerve deficit.     Sensory: No sensory deficit.     Motor: Weakness present.     Gait: Gait normal.     Comments: He has slight weakness in his right interosseous hand which is unchanged from when he was discharged few days ago.  No other weakness or numbness appreciated     ED Results / Procedures / Treatments   Labs (all labs ordered are listed, but only abnormal results are displayed) Labs Reviewed  PROTIME-INR  APTT  CBC  DIFFERENTIAL  COMPREHENSIVE METABOLIC PANEL WITH GFR  ETHANOL  I-STAT CHEM 8, ED  CBG MONITORING, ED    EKG None  Radiology No results found.  Procedures Procedures  {Document  cardiac monitor, telemetry assessment procedure when appropriate:1}  Medications Ordered in ED Medications  sodium chloride flush (NS) 0.9 % injection 3 mL (has no administration in time range)    ED Course/ Medical Decision Making/ A&P   {   Click here for ABCD2, HEART and other calculatorsREFRESH Note before signing :1}                              Medical Decision Making  This patient complains of ***; this involves an extensive number of treatment Options and is a complaint that carries with it  a high risk of complications and morbidity. The differential includes ***  I ordered, reviewed and interpreted labs, which included *** I ordered medication *** and reviewed PMP when indicated. I ordered imaging studies which included *** and I independently    visualized and interpreted imaging which showed *** Additional history obtained from *** Previous records obtained and reviewed *** I consulted *** and discussed lab and imaging findings and discussed disposition.  Cardiac monitoring reviewed, *** Social determinants considered, *** Critical Interventions: ***  After the interventions stated above, I reevaluated the patient and found *** Admission and further testing considered, ***   {Document critical care time when appropriate:1} {Document review of labs and clinical decision tools ie heart score, Chads2Vasc2 etc:1}  {Document your independent review of radiology images, and any outside records:1} {Document your discussion with family members, caretakers, and with consultants:1} {Document social determinants of health affecting pt's care:1} {Document your decision making why or why not admission, treatments were needed:1} Final Clinical Impression(s) / ED Diagnoses Final diagnoses:  None    Rx / DC Orders ED Discharge Orders     None

## 2023-06-23 NOTE — ED Triage Notes (Signed)
 Pt states that he was recently admitted for TIA and today at church he began having numbness and tingling down his L arm. Pt states this began between 1030 and 1045 today. No unilateral weakness noted. Pt states he was recently started on Eliquis

## 2023-06-23 NOTE — Discharge Instructions (Signed)
 You are seen in the emergency department for an abnormal sensation in your left arm and face.  You had blood work, EKG and an MRI of your brain that did not show any obvious sign of new stroke.  Please continue your blood thinner and your regular medications and follow-up outpatient with neurology.  Return to the emergency department if any worsening or concerning symptoms.

## 2023-06-23 NOTE — Plan of Care (Signed)
 I am asked for imaging recommendations for this patient  Justin Carr is a 78 y.o. male.  He has a prior history of coronary disease, CABG, paroxysmal A-fib.  He was admitted 4 days ago for right hand clumsiness and found to have stroke on MRI.  Started on Eliquis.  Has been compliant with treatment.  Today while he was at church around 1030 this morning he noticed a warm sensation up his left arm possibly with some numbness that extended into his left face.  Symptoms lasted about 90 minutes to 2 hours.  Drove himself here.  His symptoms are fully resolved now.  No blurry vision double vision weakness difficulty with balance.   MRI brain from 3/26 personally reviewed, agree with left precentral gyrus infarct  I recommend repeat MRI brain without contrast to confirm safety of continuing Eliquis If this is negative, outpatient follow-up is appropriate given recent full stroke workup and full resolution of his symptoms  Current vital signs: BP (!) 128/106 (BP Location: Left Arm)   Pulse (!) 59   Temp 98.5 F (36.9 C)   Resp 16   SpO2 98%  Vital signs in last 24 hours: Temp:  [98.5 F (36.9 C)] 98.5 F (36.9 C) (03/30 1220) Pulse Rate:  [59] 59 (03/30 1220) Resp:  [16] 16 (03/30 1220) BP: (128)/(106) 128/106 (03/30 1220) SpO2:  [98 %] 98 % (03/30 1220)   Brooke Dare MD-PhD [facility] [phone]

## 2023-06-24 ENCOUNTER — Encounter (HOSPITAL_BASED_OUTPATIENT_CLINIC_OR_DEPARTMENT_OTHER): Payer: Self-pay

## 2023-06-25 ENCOUNTER — Encounter: Payer: Self-pay | Admitting: *Deleted

## 2023-06-25 ENCOUNTER — Encounter: Payer: Self-pay | Admitting: Cardiology

## 2023-06-25 ENCOUNTER — Ambulatory Visit: Payer: Medicare Other | Attending: Cardiology | Admitting: Cardiology

## 2023-06-25 VITALS — BP 122/60 | HR 68 | Ht 71.0 in | Wt 213.8 lb

## 2023-06-25 DIAGNOSIS — I639 Cerebral infarction, unspecified: Secondary | ICD-10-CM

## 2023-06-25 DIAGNOSIS — Z951 Presence of aortocoronary bypass graft: Secondary | ICD-10-CM | POA: Diagnosis not present

## 2023-06-25 DIAGNOSIS — I480 Paroxysmal atrial fibrillation: Secondary | ICD-10-CM

## 2023-06-25 DIAGNOSIS — I6523 Occlusion and stenosis of bilateral carotid arteries: Secondary | ICD-10-CM | POA: Diagnosis not present

## 2023-06-25 DIAGNOSIS — E785 Hyperlipidemia, unspecified: Secondary | ICD-10-CM

## 2023-06-25 DIAGNOSIS — I2510 Atherosclerotic heart disease of native coronary artery without angina pectoris: Secondary | ICD-10-CM | POA: Diagnosis not present

## 2023-06-25 DIAGNOSIS — G4733 Obstructive sleep apnea (adult) (pediatric): Secondary | ICD-10-CM | POA: Diagnosis not present

## 2023-06-25 DIAGNOSIS — E119 Type 2 diabetes mellitus without complications: Secondary | ICD-10-CM

## 2023-06-25 NOTE — Progress Notes (Unsigned)
 Patient enrolled for [employer]/ [employer] to ship a 30 day cardiac event monitor to his address on file.

## 2023-06-25 NOTE — Patient Instructions (Signed)
 Medication Instructions:  No Changes  Lab Work: None   Testing/Procedures:  [employer] Cardiac Event Monitor Instructions  Your physician has requested you wear your cardiac event monitor for 30 days. [employer] may call or text to confirm a shipping address. The monitor will be sent to a land address via UPS. [employer] will not ship a monitor to a PO BOX. It typically takes 3-5 days to receive your monitor after it has been enrolled. [employer] will assist with USPS tracking if your package is delayed. The telephone number for [employer] is [phone]. Once you have received your monitor, please review the enclosed instructions. Instruction tutorials can also be viewed under help and settings on the enclosed cell phone. Your monitor has already been registered assigning a specific monitor serial # to you.  Billing and Self Pay Discount Information  [employer] has been provided the insurance information we had on file for you.  If your insurance has been updated, please call [employer] at [phone] to provide them with your updated insurance information.   [employer] offers a discounted Self Pay option for patients who have insurance that does not cover their cardiac event monitor or patients without insurance.  The discounted cost of a Self Pay Cardiac Event Monitor would be $225.00 , if the patient contacts [employer] at [phone] within 7 days of applying the monitor to make payment arrangements.  If the patient does not contact [employer] within 7 days of applying the monitor, the cost of the cardiac event monitor will be $350.00.  Applying the monitor  Remove cell phone from case and turn it on. The cell phone works as IT consultant and needs to be within [employer] of you at all times. The cell phone will need to be charged on a daily basis. We recommend you plug the cell phone into the enclosed charger at your bedside table every night.  Monitor batteries: You will  receive two monitor batteries labelled #1 and #2. These are your recorders. Plug battery #2 onto the second connection on the enclosed charger. Keep one battery on the charger at all times. This will keep the monitor battery deactivated. It will also keep it fully charged for when you need to switch your monitor batteries. A small light will be blinking on the battery emblem when it is charging. The light on the battery emblem will remain on when the battery is fully charged.  Open package of a Monitor strip. Insert battery #1 into black hood on strip and gently squeeze monitor battery onto connection as indicated in instruction booklet. Set aside while preparing skin.  Choose location for your strip, vertical or horizontal, as indicated in the instruction booklet. Shave to remove all hair from location. There cannot be any lotions, oils, powders, or colognes on skin where monitor is to be applied. Wipe skin clean with enclosed Saline wipe. Dry skin completely.  Peel paper labeled #1 off the back of the Monitor strip exposing the adhesive. Place the monitor on the chest in the vertical or horizontal position shown in the instruction booklet. One arrow on the monitor strip must be pointing upward. Carefully remove paper labeled #2, attaching remainder of strip to your skin. Try not to create any folds or wrinkles in the strip as you apply it.  Firmly press and release the circle in the center of the monitor battery. You will hear a small beep. This is turning the monitor battery on. The heart emblem on the monitor battery will light up every  5 seconds if the monitor battery in turned on and connected to the patient securely. Do not push and hold the circle down as this turns the monitor battery off. The cell phone will locate the monitor battery. A screen will appear on the cell phone checking the connection of your monitor strip. This may read poor connection initially but change to good  connection within the next minute. Once your monitor accepts the connection you will hear a series of 3 beeps followed by a climbing crescendo of beeps. A screen will appear on the cell phone showing the two monitor strip placement options. Touch the picture that demonstrates where you applied the monitor strip.  Your monitor strip and battery are waterproof. You are able to shower, bathe, or swim with the monitor on. They just ask you do not submerge deeper than 3 feet underwater. We recommend removing the monitor if you are swimming in a lake, river, or ocean.  Your monitor battery will need to be switched to a fully charged monitor battery approximately once a week. The cell phone will alert you of an action which needs to be made.  On the cell phone, tap for details to reveal connection status, monitor battery status, and cell phone battery status. The green dots indicates your monitor is in good status. A red dot indicates there is something that needs your attention.  To record a symptom, click the circle on the monitor battery. In 30-60 seconds a list of symptoms will appear on the cell phone. Select your symptom and tap save. Your monitor will record a sustained or significant arrhythmia regardless of you clicking the button. Some patients do not feel the heart rhythm irregularities. [employer] will notify us of any serious or critical events.  Refer to instruction booklet for instructions on switching batteries, changing strips, the Do not disturb or Pause features, or any additional questions.  Call [employer] at [phone], to confirm your monitor is transmitting and record your baseline. They will answer any questions you may have regarding the monitor instructions at that time.  Returning the monitor to [employer]  Place all equipment back into blue box. Peel off strip of paper to expose adhesive and close box securely. There is a prepaid UPS shipping label on this box.  Drop in a UPS drop box, or at a UPS facility like [employer]. You may also contact [employer] to arrange UPS to pick up monitor package at your home.   Follow-Up: At [facility], you and your health needs are our priority.  As part of our continuing mission to provide you with exceptional heart care, our providers are all part of one team.  This team includes your primary Cardiologist (physician) and Advanced Practice Providers or APPs (Physician Assistants and Nurse Practitioners) who all work together to provide you with the care you need, when you need it.  Your next appointment:   2-3 month(s)  Provider:   Nanetta Batty, MD         Valet parking services will be available as well.

## 2023-06-26 ENCOUNTER — Encounter: Payer: Self-pay | Admitting: Cardiology

## 2023-07-01 ENCOUNTER — Ambulatory Visit (INDEPENDENT_AMBULATORY_CARE_PROVIDER_SITE_OTHER): Admitting: Podiatrist

## 2023-07-01 ENCOUNTER — Encounter: Payer: Self-pay | Admitting: Podiatrist

## 2023-07-01 VITALS — Ht 71.0 in | Wt 213.8 lb

## 2023-07-01 DIAGNOSIS — L97512 Non-pressure chronic ulcer of other part of right foot with fat layer exposed: Secondary | ICD-10-CM | POA: Diagnosis not present

## 2023-07-01 DIAGNOSIS — S9032XA Contusion of left foot, initial encounter: Secondary | ICD-10-CM | POA: Diagnosis not present

## 2023-07-01 MED ORDER — MUPIROCIN 2 % EX OINT: 1.0000 | TOPICAL_OINTMENT | Freq: Two times a day (BID) | CUTANEOUS | 2 refills | Status: DC

## 2023-07-01 MED ORDER — DOXYCYCLINE HYCLATE 100 MG PO TABS: 100.0000 mg | ORAL_TABLET | Freq: Two times a day (BID) | ORAL | 0 refills | Status: DC

## 2023-07-01 NOTE — Progress Notes (Signed)
 Chief Complaint  Patient presents with   Nail Problem    " I am here for a follow up of my right big toe and using my cream"  I have some bruising on the left foot and recently started on Eliquis"     HPI: Patient is 78 y.o. male who presents today for follow up from permanent ingrown removal right great toe.  He relates he has been using the gentamicin cream - also relates he recently started on Eliquis and he has noticed some bruising on the medial and lateral sides of the left heel.  He denies any trauma or injury to the foot.     Allergies  Allergen Reactions   Peanuts [Peanut Oil] Anaphylaxis   Lipitor [Atorvastatin] Other (See Comments)    Muscle cramps   Rosuvastatin Other (See Comments)    Muscle cramps if taken daily-pt still takes drug, but takes 3 times a week.    Review of systems is negative except as noted in the HPI.  Denies nausea/ vomiting/ fevers/ chills or night sweats.   Denies difficulty breathing, denies calf pain or tenderness  Physical Exam  Patient is awake, alert, and oriented x 3.  In no acute distress.    Vascular status is intact with palpable pedal pulses DP and PT bilateral and capillary refill time less than 3 seconds bilateral.  No edema or erythema noted.   Neurological exam reveals epicritic and protective sensation grossly intact bilateral.   Dermatological exam reveals skin is supple and dry to bilateral feet.  Superficial bruising present on the medial and lateral side of the left heel. No bruising dorsally or on the right foot.   Right hallux nail that was removed has some redness peri nail and proximal medial nail fold no drainage noted-  some residual cellulitis present.  No probing to bone noted.  No malodor or active pus or pirulence seen.     Assessment:   ICD-10-CM   1. Skin ulcer of right great toe with fat layer exposed (HCC)  L97.512     2. Bruised sole of foot, left, initial encounter  S90.32XA        Plan: Discussed exam  findings and recommendations.  The bruising on both sides of the left foot appears to be superficial.  Possibly from shoe gear or in combination of him starting Eliquis.  Notably keep an eye on it and the bruising should start to resolve in the next week or so. For the right great toe-still has some redness at the proximal nail fold and recommended another round of doxycycline for 10 more days.  Also wrote her prescription for mupirocin ointment to apply to the base where the nail was removed.  Overall it seems to be improving albeit slow.  He will be seen back in a week to 10 days for recheck.  If any increased redness, swelling, drainage arise he is instructed to call immediately.

## 2023-07-02 DIAGNOSIS — I2510 Atherosclerotic heart disease of native coronary artery without angina pectoris: Secondary | ICD-10-CM | POA: Diagnosis not present

## 2023-07-02 DIAGNOSIS — E1140 Type 2 diabetes mellitus with diabetic neuropathy, unspecified: Secondary | ICD-10-CM | POA: Diagnosis not present

## 2023-07-02 DIAGNOSIS — I10 Essential (primary) hypertension: Secondary | ICD-10-CM | POA: Diagnosis not present

## 2023-07-02 DIAGNOSIS — Z7901 Long term (current) use of anticoagulants: Secondary | ICD-10-CM | POA: Diagnosis not present

## 2023-07-02 DIAGNOSIS — Z8673 Personal history of transient ischemic attack (TIA), and cerebral infarction without residual deficits: Secondary | ICD-10-CM | POA: Diagnosis not present

## 2023-07-03 DIAGNOSIS — I480 Paroxysmal atrial fibrillation: Secondary | ICD-10-CM | POA: Diagnosis not present

## 2023-07-08 DIAGNOSIS — M79605 Pain in left leg: Secondary | ICD-10-CM | POA: Diagnosis not present

## 2023-07-08 DIAGNOSIS — M79662 Pain in left lower leg: Secondary | ICD-10-CM | POA: Diagnosis not present

## 2023-07-08 DIAGNOSIS — M79604 Pain in right leg: Secondary | ICD-10-CM | POA: Diagnosis not present

## 2023-07-08 DIAGNOSIS — M79661 Pain in right lower leg: Secondary | ICD-10-CM | POA: Diagnosis not present

## 2023-07-09 ENCOUNTER — Other Ambulatory Visit (HOSPITAL_COMMUNITY): Payer: Self-pay | Admitting: Medical

## 2023-07-09 ENCOUNTER — Ambulatory Visit (HOSPITAL_COMMUNITY)
Admission: RE | Admit: 2023-07-09 | Discharge: 2023-07-09 | Disposition: A | Discharge status: Home / Self Care | Source: Ambulatory Visit | Attending: Cardiology | Admitting: Cardiology

## 2023-07-09 DIAGNOSIS — M79662 Pain in left lower leg: Secondary | ICD-10-CM | POA: Insufficient documentation

## 2023-07-17 ENCOUNTER — Encounter: Payer: Self-pay | Admitting: Podiatrist

## 2023-07-17 ENCOUNTER — Ambulatory Visit (INDEPENDENT_AMBULATORY_CARE_PROVIDER_SITE_OTHER): Admitting: Podiatrist

## 2023-07-17 DIAGNOSIS — L600 Ingrowing nail: Secondary | ICD-10-CM

## 2023-07-17 NOTE — Progress Notes (Signed)
 Chief Complaint  Patient presents with   Foot Ulcer    Follow up ulcer hallux right   "Its better, but still leaking a little bit"     HPI: Patient is 78 y.o. male who presents today for follow up status post permanent phenol matrixectomy right hallux.  Relates it is healing but still a little drainage present on the bandaid.  He continues to keep the toe covered with mupirocin  ointment and bandaid.  Relates some soreness is still present.    Allergies  Allergen Reactions   Peanuts [Peanut Oil] Anaphylaxis   Lipitor [Atorvastatin ] Other (See Comments)    Muscle cramps   Rosuvastatin  Other (See Comments)    Muscle cramps if taken daily-pt still takes drug, but takes 3 times a week.    Review of systems is negative except as noted in the HPI.  Denies nausea/ vomiting/ fevers/ chills or night sweats.   Denies difficulty breathing, denies calf pain or tenderness  Physical Exam  Patient is awake, alert, and oriented x 3.  In no acute distress.    Neurovascular status intact and unchanged.  Right hallux nail is almost fully healed.  Slight area of pink noted and slight macerated area is present-  much improved.  Some tenderness with pressure noted.    Assessment:   ICD-10-CM   1. Ingrown right greater toenail  L60.0        Plan: Recommended he continue soaks for a few more days.  Also to start to leave toe open to air at night.  Will continue to cover with baidaid during the day until no drainage noted on the pad. Will call if drainage continues longer than 2 weeks.  Otherwise will be seen back prn.

## 2023-07-18 ENCOUNTER — Encounter: Payer: Self-pay | Admitting: Cardiovascular Disease

## 2023-07-19 MED ORDER — APIXABAN 5 MG PO TABS: 5.0000 mg | ORAL_TABLET | Freq: Two times a day (BID) | ORAL | 5 refills | Status: DC

## 2023-07-28 ENCOUNTER — Other Ambulatory Visit: Payer: Self-pay | Admitting: Cardiovascular Disease

## 2023-07-30 ENCOUNTER — Telehealth: Payer: Self-pay | Admitting: Cardiovascular Disease

## 2023-07-30 MED ORDER — RAMIPRIL 10 MG PO CAPS: 10.0000 mg | ORAL_CAPSULE | Freq: Every day | ORAL | 3 refills | Status: AC

## 2023-07-30 NOTE — Telephone Encounter (Signed)
 RX sent to requested Pharmacy

## 2023-07-30 NOTE — Telephone Encounter (Signed)
*  STAT* If patient is at the pharmacy, call can be transferred to refill team.   1. Which medications need to be refilled? (please list name of each medication and dose if known)   ramipril  (ALTACE ) 10 MG capsule    2. Which pharmacy/location (including street and city if local pharmacy) is medication to be sent to?  [id number] - [place] ([place]), [place] - [address]      3. Do they need a 30 day or 90 day supply? 90 day    Pt is out of medication

## 2023-08-02 ENCOUNTER — Ambulatory Visit: Attending: Cardiology

## 2023-08-02 DIAGNOSIS — I480 Paroxysmal atrial fibrillation: Secondary | ICD-10-CM | POA: Diagnosis not present

## 2023-08-08 DIAGNOSIS — E1165 Type 2 diabetes mellitus with hyperglycemia: Secondary | ICD-10-CM | POA: Diagnosis not present

## 2023-08-09 ENCOUNTER — Ambulatory Visit: Payer: Self-pay

## 2023-08-09 NOTE — Telephone Encounter (Signed)
-----   Message from Katlyn D West sent at 08/02/2023  5:27 PM EDT ----- Please let Justin Carr know that his cardiac monitor showed his predominant rhythm was sinus rhythm with average heart rate 65 bpm. He had occasional early beats from the bottom chambers of the heart and short episodes of fast beats from the top chambers of the heart. There was no evidence of atrial fibrillation. Good results! Continue current medications and follow up as planned.

## 2023-08-09 NOTE — Telephone Encounter (Signed)
 Left message to call back

## 2023-08-12 NOTE — Telephone Encounter (Signed)
pt aware of results  

## 2023-08-13 DIAGNOSIS — L570 Actinic keratosis: Secondary | ICD-10-CM | POA: Diagnosis not present

## 2023-08-13 DIAGNOSIS — L814 Other melanin hyperpigmentation: Secondary | ICD-10-CM | POA: Diagnosis not present

## 2023-08-13 DIAGNOSIS — D2262 Melanocytic nevi of left upper limb, including shoulder: Secondary | ICD-10-CM | POA: Diagnosis not present

## 2023-08-13 DIAGNOSIS — D224 Melanocytic nevi of scalp and neck: Secondary | ICD-10-CM | POA: Diagnosis not present

## 2023-08-13 DIAGNOSIS — D225 Melanocytic nevi of trunk: Secondary | ICD-10-CM | POA: Diagnosis not present

## 2023-08-13 DIAGNOSIS — D2261 Melanocytic nevi of right upper limb, including shoulder: Secondary | ICD-10-CM | POA: Diagnosis not present

## 2023-08-13 DIAGNOSIS — L821 Other seborrheic keratosis: Secondary | ICD-10-CM | POA: Diagnosis not present

## 2023-08-15 DIAGNOSIS — I10 Essential (primary) hypertension: Secondary | ICD-10-CM | POA: Diagnosis not present

## 2023-08-15 DIAGNOSIS — E1165 Type 2 diabetes mellitus with hyperglycemia: Secondary | ICD-10-CM | POA: Diagnosis not present

## 2023-08-15 DIAGNOSIS — I2510 Atherosclerotic heart disease of native coronary artery without angina pectoris: Secondary | ICD-10-CM | POA: Diagnosis not present

## 2023-08-15 DIAGNOSIS — E7800 Pure hypercholesterolemia, unspecified: Secondary | ICD-10-CM | POA: Diagnosis not present

## 2023-08-15 DIAGNOSIS — E1140 Type 2 diabetes mellitus with diabetic neuropathy, unspecified: Secondary | ICD-10-CM | POA: Diagnosis not present

## 2023-09-02 ENCOUNTER — Encounter: Payer: Self-pay | Admitting: Cardiovascular Disease

## 2023-09-02 ENCOUNTER — Ambulatory Visit: Attending: Cardiology | Admitting: Cardiovascular Disease

## 2023-09-02 VITALS — BP 106/56 | HR 60 | Ht 71.0 in | Wt 214.0 lb

## 2023-09-02 DIAGNOSIS — I10 Essential (primary) hypertension: Secondary | ICD-10-CM

## 2023-09-02 DIAGNOSIS — E782 Mixed hyperlipidemia: Secondary | ICD-10-CM

## 2023-09-02 DIAGNOSIS — I701 Atherosclerosis of renal artery: Secondary | ICD-10-CM

## 2023-09-02 DIAGNOSIS — I2510 Atherosclerotic heart disease of native coronary artery without angina pectoris: Secondary | ICD-10-CM

## 2023-09-02 DIAGNOSIS — I480 Paroxysmal atrial fibrillation: Secondary | ICD-10-CM | POA: Diagnosis not present

## 2023-09-02 NOTE — Assessment & Plan Note (Signed)
 History of CAD status post non-STEMI July 02, 2007.  He underwent PCI and stenting of his proximal LAD with a Taxus Liberte drug-eluting stent.  He also has a 60% right renal artery stenosis documented angiographically at that time.  MIV stress test performed 04/04/2017 showed ischemia in the LAD territory.  I catheterized him regularly 04/11/2017 revealing "99% in-stent restenosis" within approximately the stent.  I restented him with a 3 mm x 20 mm long Synergy drug-eluting stent postdilated to 3.23 mm.  Because of symptoms similar to his preintervention symptoms I decided to [facility] 05/11/2021 revealing 70% ostial left main and 75% proximal RCA stenosis.  Ultimately underwent CABG x 3 by Dr. Deloise Ferries 05/17/2021 with LIMA to his LAD, vein to an obtuse marginal branch and PDA.  He did well postoperatively.  He has fairly active but had a recent stress test performed 3//25 that was nonischemic.  He currently denies chest pain.

## 2023-09-02 NOTE — Assessment & Plan Note (Signed)
 History of hyperlipidemia on statin therapy lipid profile performed 06/20/2023 revealing total cholesterol 134, LDL 61 and HDL 36.

## 2023-09-02 NOTE — Assessment & Plan Note (Signed)
 History of essential hypertension with blood pressure measured today 106/56.  He is on metoprolol  and ramipril .

## 2023-09-02 NOTE — Progress Notes (Signed)
 09/02/2023 Justin Carr   1945-06-28  [id number]  Primary Physician Justin Congo, MD Primary Cardiologist: Justin Leigh MD Justin Carr, Justin Carr  HPI:  Justin Carr is a 78 y.o.  mildly overweight married Caucasian male, father of 2 children (1 living), who I  I last saw in the office 04/24/2022 04/26/2023. 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 His problems include obstructive sleep apnea, on CPAP, hypertension, and hyperlipidemia. He denies chest pain or shortness of breath. He had a non-ST-segment-elevation myocardial infarction, July 02, 2007. He underwent PCI and stenting of his proximal LAD with a Taxus Liberte drug-eluting stent. He had also had a 60% right renal artery stenosis documented angiographically at that time, which we have been following by duplex ultrasound. Since I saw him back a year ago he denies chest pain or shortness of breath but has noticed some left upper extremity discomfort with exertion similar to his pre-MI symptoms. Justin Carr    He  underwent Myoview  stress testing 04/04/17 that showed ischemia in the LAD territory. I performed radial diagnostic cath on him 04/11/17 revealed a 99% "in-stent restenosis" within the proximal LAD stent. Remainder of his coronary anatomy was free of significant disease in his LV function was normal. I restarted him with a 3 mm x 20 mm long synergy drug-eluting stent postdilated to 3.23 mm resulting reduction a 9% stenosis to 0% residual. He has done well since.   He underwent blepharoplasty and after the several weeks post procedure has noticed some discomfort in his left bicep and has occurred both at rest and with exertion.  These symptoms are similar to his preintervention symptoms 4 years ago.  Based on this I decided to proceed with outpatient diagnostic coronary angiography 05/11/2021 revealing a patent proximal LAD stent  with 70% ostial left main and 75% proximal RCA stenosis.  He ultimately underwent CABG x3 by Dr. Deloise Carr 05/17/2021 with a LIMA to his LAD, vein to an obtuse marginal branch and PDA.  He did well postoperatively.  He is very active and currently is asymptomatic.  Did have perioperative PAF and was discharged home on amiodarone  and Eliquis .  He had no recurrence.   Since I saw him in the office 6 months ago he did have a stroke back in March and was seen by Dr. Janett Carr for this.  He was placed back on Eliquis  oral anticoagulation.  He had been on this postoperatively for [employer].  2D echo was normal as were carotid Doppler studies.  An event monitor showed no evidence of atrial fibrillation.  He is otherwise stable and active.   Current Meds  Medication Sig   Alpha-Lipoic Acid 300 MG CAPS Take 300 mg by mouth in the morning and at bedtime.   apixaban  (ELIQUIS ) 5 MG TABS tablet Take 1 tablet (5 mg total) by mouth 2 (two) times daily.   Coenzyme Q10 (COQ-10 PO) Take 1 capsule by mouth every evening.   FARXIGA  10 MG TABS tablet Take 10 mg by mouth in the morning.   metoprolol  tartrate (LOPRESSOR ) 25 MG tablet Take 1/2 (one-half) tablet by mouth twice daily (Patient taking differently: Take 12.5 mg by mouth 2 (two) times daily.)   Multiple Vitamin (MULTIVITAMIN WITH MINERALS) TABS tablet Take 1 tablet by mouth 2 (two) times daily with a meal.   Multiple Vitamins-Minerals (PRESERVISION AREDS PO) Take 1 capsule  by mouth 2 (two) times daily.   pregabalin  (LYRICA ) 100 MG capsule Take 100 mg by mouth 2 (two) times daily.   PRESCRIPTION MEDICATION CPAP- At bedtime   ramipril  (ALTACE ) 10 MG capsule Take 1 capsule (10 mg total) by mouth daily.   rosuvastatin  (CRESTOR ) 10 MG tablet Take 10 mg by mouth daily.   sildenafil (VIAGRA) 100 MG tablet Take 100 mg by mouth daily as needed for erectile dysfunction.   TRULICITY 1.5 MG/0.5ML SOAJ Inject 1.5 mg into the skin every Monday.   vitamin B-12 (CYANOCOBALAMIN)  100 MCG tablet Take 100 mcg by mouth daily.     Allergies  Allergen Reactions   Peanuts [Peanut Oil] Anaphylaxis   Lipitor [Atorvastatin ] Other (See Comments)    Muscle cramps   Rosuvastatin  Other (See Comments)    Muscle cramps if taken daily-pt still takes drug, but takes 3 times a week.    Social History   Socioeconomic History   Marital status: Married    Spouse name: Not on file   Number of children: 2   Years of education: College +   Highest education level: Not on file  Occupational History   Occupation: Retired  Tobacco Use   Smoking status: Former    Current packs/day: 0.00    Average packs/day: 1 pack/day for 34.0 years (34.0 ttl pk-yrs)    Types: Cigarettes    Start date: 07/02/1973    Quit date: 07/03/2007    Years since quitting: 16.1   Smokeless tobacco: Never  Vaping Use   Vaping status: Never Used  Substance and Sexual Activity   Alcohol use: Yes    Comment: 04/11/2017 "might have a beer q other week"   Drug use: No   Sexual activity: Not Currently  Other Topics Concern   Not on file  Social History Narrative   1 cup coffee and 2 sodas per day.   Lives at home with his wife.   Right-handed.   Social Drivers of Corporate investment banker Strain: Low Risk  (05/28/2017)   Overall Financial Resource Strain (CARDIA)    Difficulty of Paying Living Expenses: Not hard at all  Food Insecurity: No Food Insecurity (06/20/2023)   Hunger Vital Sign    Worried About Running Out of Food in the Last Year: Never true    Ran Out of Food in the Last Year: Never true  Transportation Needs: No Transportation Needs (06/20/2023)   PRAPARE - Administrator, Civil Service (Medical): No    Lack of Transportation (Non-Medical): No  Physical Activity: Insufficiently Active (05/28/2017)   Exercise Vital Sign    Days of Exercise per Week: 3 days    Minutes of Exercise per Session: 30 min  Stress: No Stress Concern Present (05/28/2017)   [employer] of  Occupational Health - Occupational Stress Questionnaire    Feeling of Stress : Not at all  Social Connections: Socially Integrated (06/20/2023)   Social Connection and Isolation Panel [NHANES]    Frequency of Communication with Friends and Family: Three times a week    Frequency of Social Gatherings with Friends and Family: More than three times a week    Attends Religious Services: 1 to 4 times per year    Active Member of [employer] or Organizations: Yes    Attends Banker Meetings: 1 to 4 times per year    Marital Status: Married  Catering manager Violence: Not At Risk (06/20/2023)   Humiliation, Afraid, Rape, and Kick  questionnaire    Fear of Current or Ex-Partner: No    Emotionally Abused: No    Physically Abused: No    Sexually Abused: No     Review of Systems: General: negative for chills, fever, night sweats or weight changes.  Cardiovascular: negative for chest pain, dyspnea on exertion, edema, orthopnea, palpitations, paroxysmal nocturnal dyspnea or shortness of breath Dermatological: negative for rash Respiratory: negative for cough or wheezing Urologic: negative for hematuria Abdominal: negative for nausea, vomiting, diarrhea, bright red blood per rectum, melena, or hematemesis Neurologic: negative for visual changes, syncope, or dizziness All other systems reviewed and are otherwise negative except as noted above.    Blood pressure (!) 106/56, pulse 60, height 5\' 11"  (1.803 m), weight 214 lb (97.1 kg), SpO2 91%.  General appearance: alert and no distress Neck: no adenopathy, no carotid bruit, no JVD, supple, symmetrical, trachea midline, and thyroid  not enlarged, symmetric, no tenderness/mass/nodules Lungs: clear to auscultation bilaterally Heart: regular rate and rhythm, S1, S2 normal, no murmur, click, rub or gallop Extremities: extremities normal, atraumatic, no cyanosis or edema Pulses: 2+ and symmetric Skin: Skin color, texture, turgor normal. No rashes or  lesions Neurologic: Grossly normal  EKG not performed today      ASSESSMENT AND PLAN:   Coronary artery disease History of CAD status post non-STEMI July 02, 2007.  He underwent PCI and stenting of his proximal LAD with a Taxus Liberte drug-eluting stent.  He also has a 60% right renal artery stenosis documented angiographically at that time.  MIV stress test performed 04/04/2017 showed ischemia in the LAD territory.  I catheterized him regularly 04/11/2017 revealing "99% in-stent restenosis" within approximately the stent.  I restented him with a 3 mm x 20 mm long Synergy drug-eluting stent postdilated to 3.23 mm.  Because of symptoms similar to his preintervention symptoms I decided to [facility] 05/11/2021 revealing 70% ostial left main and 75% proximal RCA stenosis.  Ultimately underwent CABG x 3 by Dr. Deloise Carr 05/17/2021 with LIMA to his LAD, vein to an obtuse marginal branch and PDA.  He did well postoperatively.  He has fairly active but had a recent stress test performed 3//25 that was nonischemic.  He currently denies chest pain.  Essential hypertension History of essential hypertension with blood pressure measured today 106/56.  He is on metoprolol  and ramipril .  Hyperlipidemia History of hyperlipidemia on statin therapy lipid profile performed 06/20/2023 revealing total cholesterol 134, LDL 61 and HDL 36.  Renal artery stenosis (HCC) History of 60% right renal artery stenosis demonstrated angiographically at the time of cath in 2009.  Subsequent renal Doppler studies have showed a widely patent renal arteries.  Paroxysmal atrial fibrillation (HCC) History of brief PAF but evidence of a CABG.  Ultimately his oral anticoagulation was discontinued.  He recently had a stroke in March and was seen by Dr. Janett Carr, stroke neurologist, who recommended that he go back on Eliquis .  He did have a 30-day monitor performed 08/02/2023 that showed no evidence of A-fib.     Justin Leigh MD  FACP,FACC,FAHA, [facility] 09/02/2023 9:07 AM

## 2023-09-02 NOTE — Assessment & Plan Note (Signed)
 History of brief PAF but evidence of a CABG.  Ultimately his oral anticoagulation was discontinued.  He recently had a stroke in March and was seen by Dr. Janett Medin, stroke neurologist, who recommended that he go back on Eliquis .  He did have a 30-day monitor performed 08/02/2023 that showed no evidence of A-fib.

## 2023-09-02 NOTE — Patient Instructions (Signed)
 Medication Instructions:  Your physician recommends that you continue on your current medications as directed. Please refer to the Current Medication list given to you today.  *If you need a refill on your cardiac medications before your next appointment, please call your pharmacy*  Follow-Up: At [facility], you and your health needs are our priority.  As part of our continuing mission to provide you with exceptional heart care, our providers are all part of one team.  This team includes your primary Cardiologist (physician) and Advanced Practice Providers or APPs (Physician Assistants and Nurse Practitioners) who all work together to provide you with the care you need, when you need it.  Your next appointment:   6 month(s)  Provider:   Katlyn West, NP         Then, Lauro Portal, MD will plan to see you again in 12 month(s).    We recommend signing up for the patient portal called "MyChart".  Sign up information is provided on this After Visit Summary.  MyChart is used to connect with patients for Virtual Visits (Telemedicine).  Patients are able to view lab/test results, encounter notes, upcoming appointments, etc.  Non-urgent messages can be sent to your provider as well.   To learn more about what you can do with MyChart, go to [email or web address].

## 2023-09-02 NOTE — Assessment & Plan Note (Signed)
 History of 60% right renal artery stenosis demonstrated angiographically at the time of cath in 2009.  Subsequent renal Doppler studies have showed a widely patent renal arteries.

## 2023-10-03 DIAGNOSIS — G459 Transient cerebral ischemic attack, unspecified: Secondary | ICD-10-CM | POA: Diagnosis not present

## 2023-10-03 DIAGNOSIS — Z7901 Long term (current) use of anticoagulants: Secondary | ICD-10-CM | POA: Diagnosis not present

## 2023-10-03 DIAGNOSIS — Z8679 Personal history of other diseases of the circulatory system: Secondary | ICD-10-CM | POA: Diagnosis not present

## 2023-10-15 ENCOUNTER — Encounter: Payer: Self-pay | Admitting: Neurology

## 2023-10-15 ENCOUNTER — Ambulatory Visit (INDEPENDENT_AMBULATORY_CARE_PROVIDER_SITE_OTHER): Admitting: Neurology

## 2023-10-15 VITALS — BP 107/54 | HR 60 | Ht 71.0 in | Wt 213.8 lb

## 2023-10-15 DIAGNOSIS — R29898 Other symptoms and signs involving the musculoskeletal system: Secondary | ICD-10-CM

## 2023-10-15 DIAGNOSIS — I480 Paroxysmal atrial fibrillation: Secondary | ICD-10-CM | POA: Diagnosis not present

## 2023-10-15 DIAGNOSIS — I63412 Cerebral infarction due to embolism of left middle cerebral artery: Secondary | ICD-10-CM

## 2023-10-15 DIAGNOSIS — G459 Transient cerebral ischemic attack, unspecified: Secondary | ICD-10-CM | POA: Diagnosis not present

## 2023-10-15 NOTE — Patient Instructions (Signed)
 I had a long d/w patient about his recent embolic stroke and TIA, h/o postop atrial fibrillation,, risk for recurrent stroke/TIAs, personally independently reviewed imaging studies and stroke evaluation results and answered questions.Continue Eliquis  (apixaban ) 5 mg twice daily  for secondary stroke prevention and maintain strict control of hypertension with blood pressure goal below 130/90, diabetes with hemoglobin A1c goal below 6.5% and lipids with LDL cholesterol goal below 70 mg/dL. I also advised the patient to eat a healthy diet with plenty of whole grains, cereals, fruits and vegetables, exercise regularly and maintain ideal body weight .  Continue CPAP every night for his sleep apnea.  Followup in the future with me only as necessary.  Stroke Prevention Some medical conditions and behaviors can lead to a higher chance of having a stroke. You can help prevent a stroke by eating healthy, exercising, not smoking, and managing any medical conditions you have. Stroke is a leading cause of functional impairment. Primary prevention is particularly important because a majority of strokes are first-time events. Stroke changes the lives of not only those who experience a stroke but also their family and other caregivers. How can this condition affect me? A stroke is a medical emergency and should be treated right away. A stroke can lead to brain damage and can sometimes be life-threatening. If a person gets medical treatment right away, there is a better chance of surviving and recovering from a stroke. What can increase my risk? The following medical conditions may increase your risk of a stroke: Cardiovascular disease. High blood pressure (hypertension). Diabetes. High cholesterol. Sickle cell disease. Blood clotting disorders (hypercoagulable state). Obesity. Sleep disorders (obstructive sleep apnea). Other risk factors include: Being older than age 40. Having a history of blood clots, stroke,  or mini-stroke (transient ischemic attack, TIA). Genetic factors, such as race, ethnicity, or a family history of stroke. Smoking cigarettes or using other tobacco products. Taking birth control pills, especially if you also use tobacco. Heavy use of alcohol or drugs, especially cocaine and methamphetamine. Physical inactivity. What actions can I take to prevent this? Manage your health conditions High cholesterol levels. Eating a healthy diet is important for preventing high cholesterol. If cholesterol cannot be managed through diet alone, you may need to take medicines. Take any prescribed medicines to control your cholesterol as told by your health care provider. Hypertension. To reduce your risk of stroke, try to keep your blood pressure below 130/80. Eating a healthy diet and exercising regularly are important for controlling blood pressure. If these steps are not enough to manage your blood pressure, you may need to take medicines. Take any prescribed medicines to control hypertension as told by your health care provider. Ask your health care provider if you should monitor your blood pressure at home. Have your blood pressure checked every year, even if your blood pressure is normal. Blood pressure increases with age and some medical conditions. Diabetes. Eating a healthy diet and exercising regularly are important parts of managing your blood sugar (glucose). If your blood sugar cannot be managed through diet and exercise, you may need to take medicines. Take any prescribed medicines to control your diabetes as told by your health care provider. Get evaluated for obstructive sleep apnea. Talk to your health care provider about getting a sleep evaluation if you snore a lot or have excessive sleepiness. Make sure that any other medical conditions you have, such as atrial fibrillation or atherosclerosis, are managed. Nutrition Follow instructions from your health care provider about what  to eat or drink to help manage your health condition. These instructions may include: Reducing your daily calorie intake. Limiting how much salt (sodium) you use to 1,500 milligrams (mg) each day. Using only healthy fats for cooking, such as olive oil, canola oil, or sunflower oil. Eating healthy foods. You can do this by: Choosing foods that are high in fiber, such as whole grains, and fresh fruits and vegetables. Eating at least 5 servings of fruits and vegetables a day. Try to fill one-half of your plate with fruits and vegetables at each meal. Choosing lean protein foods, such as lean cuts of meat, poultry without skin, fish, tofu, beans, and nuts. Eating low-fat dairy products. Avoiding foods that are high in sodium. This can help lower blood pressure. Avoiding foods that have saturated fat, trans fat, and cholesterol. This can help prevent high cholesterol. Avoiding processed and prepared foods. Counting your daily carbohydrate intake.  Lifestyle If you drink alcohol: Limit how much you have to: 0-1 drink a day for women who are not pregnant. 0-2 drinks a day for men. Know how much alcohol is in your drink. In the [place] one drink equals one 12 oz bottle of beer ( ), one 5 oz glass of wine ( ), or one 1 oz glass of hard liquor (44mL). Do not use any products that contain nicotine or tobacco. These products include cigarettes, chewing tobacco, and vaping devices, such as e-cigarettes. If you need help quitting, ask your health care provider. Avoid secondhand smoke. Do not use drugs. Activity  Try to stay at a healthy weight. Get at least 30 minutes of exercise on most days, such as: Fast walking. Biking. Swimming. Medicines Take over-the-counter and prescription medicines only as told by your health care provider. Aspirin  or blood thinners (antiplatelets or anticoagulants) may be recommended to reduce your risk of forming blood clots that can lead to stroke. Avoid taking  birth control pills. Talk to your health care provider about the risks of taking birth control pills if: You are over 56 years old. You smoke. You get very bad headaches. You have had a blood clot. Where to find more information American Stroke Association: www.strokeassociation.org Get help right away if: You or a loved one has any symptoms of a stroke. BE FAST is an easy way to remember the main warning signs of a stroke: B - Balance. Signs are dizziness, sudden trouble walking, or loss of balance. E - Eyes. Signs are trouble seeing or a sudden change in vision. F - Face. Signs are sudden weakness or numbness of the face, or the face or eyelid drooping on one side. A - Arms. Signs are weakness or numbness in an arm. This happens suddenly and usually on one side of the body. S - Speech. Signs are sudden trouble speaking, slurred speech, or trouble understanding what people say. T - Time. Time to call emergency services. Write down what time symptoms started. You or a loved one has other signs of a stroke, such as: A sudden, severe headache with no known cause. Nausea or vomiting. Seizure. These symptoms may represent a serious problem that is an emergency. Do not wait to see if the symptoms will go away. Get medical help right away. Call your local emergency services (911 in the [place] Do not drive yourself to the hospital. Summary You can help to prevent a stroke by eating healthy, exercising, not smoking, limiting alcohol intake, and managing any medical conditions you may have. Do not use any products  that contain nicotine or tobacco. These include cigarettes, chewing tobacco, and vaping devices, such as e-cigarettes. If you need help quitting, ask your health care provider. Remember BE FAST for warning signs of a stroke. Get help right away if you or a loved one has any of these signs. This information is not intended to replace advice given to you by your health care provider. Make  sure you discuss any questions you have with your health care provider. Document Revised: 02/12/2022 Document Reviewed: 02/12/2022 Elsevier Patient Education  2024 [employer].

## 2023-10-15 NOTE — Progress Notes (Signed)
 Justin Carr [address] [place]. [place] [address] [phone]       OFFICE FOLLOW-UP NOTE  Justin Carr Date of Birth:  1946/03/22 Medical Record Number:  [id number]   HPI: Mr. [name] is a 78 year old pleasant Caucasian male seen today for initial office follow-up visit following hospital consultation for stroke in March 2025.  History is obtained from the patient and review of electronic medical records.  I personally reviewed pertinent available imaging films in PACS. He has past medical history of hypertension, hyperlipidemia, diabetes, coronary artery disease status post CABG, transient postoperative A-fib, gastroesophageal reflux disease, sleep apnea on CPAP.,  Small fiber peripheral neuropathy for which she was last seen by Dr. Onita in 2017.  He developed sudden onset of incoordination of the right hand and weakness on 06/18/2023 at 2300 hrs. when he was walking his dog for a walk.  He had trouble taking his dog off the leash and using his right hand due to poor weakness and coordination.  Symptoms are improving by the time he reached the emergency room however MRI scan showed a small acute infarct in the left precentral gyrus.  CT angiogram showed no large vessel stenosis or occlusion.  Echocardiogram showed ejection fraction 60 to 65% with grade 1 diastolic dysfunction.  Left atrial size was normal.  LDL cholesterol was 61 mg percent and hemoglobin A1c 5.9.  Patient subsequently had a 30-day outpatient heart monitor which was negative for A-fib however the patient did have transient postop A-fib following CABG for which she was started on Eliquis  for a few days hence it was decided to start him back on Eliquis .  Patient states he done well since discharge.  He still has some diminished fine motor skills in the right hand with occasional tingling but has recovered well.  He was actually seen back in the ER 3 days later on 06/23/2023 with transient left face and hand  paresthesias.  Repeat MRI scan of the brain was obtained which showed no acute findings and expected evolutionary changes in the left prefrontal infarct.  Patient states he done well since then.  He is tolerating Eliquis  well with only minor bruising and no bleeding.  States his blood pressure is under good control.  He has been compliant with using his CPAP.  He has had no recurrent stroke or TIA symptoms.  He is tolerating Crestor  well without muscle aches and pains.  He has no new complaints today. ROS:   14 system review of systems is positive for right hand weakness weakness, tingling, numbness, bruising all other systems negative  PMH:  Past Medical History:  Diagnosis Date   Coronary artery disease    GERD (gastroesophageal reflux disease)    Hyperlipidemia    Hypertension    NSTEMI (non-ST elevated myocardial infarction) (HCC) 06/2007   thelbert 07/27/2010   OSA on CPAP    Palpitations    Pneumonia ~ 1971 X 1   Type II diabetes mellitus (HCC)     Social History:  Social History   Socioeconomic History   Marital status: Married    Spouse name: Not on file   Number of children: 2   Years of education: College +   Highest education level: Not on file  Occupational History   Occupation: Retired  Tobacco Use   Smoking status: Former    Current packs/day: 0.00    Average packs/day: 1 pack/day for 34.0 years (34.0 ttl pk-yrs)    Types: Cigarettes  Start date: 07/02/1973    Quit date: 07/03/2007    Years since quitting: 16.2   Smokeless tobacco: Never  Vaping Use   Vaping status: Never Used  Substance and Sexual Activity   Alcohol use: Yes    Comment: occ beer   Drug use: No   Sexual activity: Not Currently  Other Topics Concern   Not on file  Social History Narrative   1 cup coffee and 2 sodas per day.   Lives at home with his wife.   Right-handed.   Works part time    Social Drivers of Corporate investment banker Strain: Low Risk  (05/28/2017)   Overall Financial  Resource Strain (CARDIA)    Difficulty of Paying Living Expenses: Not hard at all  Food Insecurity: No Food Insecurity (06/20/2023)   Hunger Vital Sign    Worried About Running Out of Food in the Last Year: Never true    Ran Out of Food in the Last Year: Never true  Transportation Needs: No Transportation Needs (06/20/2023)   PRAPARE - Administrator, Civil Service (Medical): No    Lack of Transportation (Non-Medical): No  Physical Activity: Insufficiently Active (05/28/2017)   Exercise Vital Sign    Days of Exercise per Week: 3 days    Minutes of Exercise per Session: 30 min  Stress: No Stress Concern Present (05/28/2017)   [employer] of Occupational Health - Occupational Stress Questionnaire    Feeling of Stress : Not at all  Social Connections: Socially Integrated (06/20/2023)   Social Connection and Isolation Panel    Frequency of Communication with Friends and Family: Three times a week    Frequency of Social Gatherings with Friends and Family: More than three times a week    Attends Religious Services: 1 to 4 times per year    Active Member of [employer] or Organizations: Yes    Attends Banker Meetings: 1 to 4 times per year    Marital Status: Married  Catering manager Violence: Not At Risk (06/20/2023)   Humiliation, Afraid, Rape, and Kick questionnaire    Fear of Current or Ex-Partner: No    Emotionally Abused: No    Physically Abused: No    Sexually Abused: No    Medications:   Current Outpatient Medications on File Prior to Visit  Medication Sig Dispense Refill   Alpha-Lipoic Acid 300 MG CAPS Take 300 mg by mouth in the morning and at bedtime.     apixaban  (ELIQUIS ) 5 MG TABS tablet Take 1 tablet (5 mg total) by mouth 2 (two) times daily. 60 tablet 5   Coenzyme Q10 (COQ-10 PO) Take 1 capsule by mouth every evening.     FARXIGA  10 MG TABS tablet Take 10 mg by mouth in the morning.     metoprolol  tartrate (LOPRESSOR ) 25 MG tablet Take 1/2 (one-half)  tablet by mouth twice daily (Patient taking differently: Take 12.5 mg by mouth 2 (two) times daily.) 90 tablet 3   Multiple Vitamin (MULTIVITAMIN WITH MINERALS) TABS tablet Take 1 tablet by mouth 2 (two) times daily with a meal.     Multiple Vitamins-Minerals (PRESERVISION AREDS PO) Take 1 capsule by mouth 2 (two) times daily.     pregabalin  (LYRICA ) 100 MG capsule Take 100 mg by mouth 2 (two) times daily.     PRESCRIPTION MEDICATION CPAP- At bedtime     ramipril  (ALTACE ) 10 MG capsule Take 1 capsule (10 mg total) by mouth daily. 90 capsule  3   rosuvastatin  (CRESTOR ) 10 MG tablet Take 10 mg by mouth daily.     sildenafil (VIAGRA) 100 MG tablet Take 100 mg by mouth daily as needed for erectile dysfunction.     TRULICITY 1.5 MG/0.5ML SOAJ Inject 1.5 mg into the skin every Monday.     vitamin B-12 (CYANOCOBALAMIN) 100 MCG tablet Take 100 mcg by mouth daily.     No current facility-administered medications on file prior to visit.    Allergies:   Allergies  Allergen Reactions   Peanuts [Peanut Oil] Anaphylaxis   Lipitor [Atorvastatin ] Other (See Comments)    Muscle cramps   Rosuvastatin  Other (See Comments)    Muscle cramps if taken daily-pt still takes drug, but takes 3 times a week.    Physical Exam General: well developed, well nourished, seated, in no evident distress Head: head normocephalic and atraumatic.  Neck: supple with no carotid or supraclavicular bruits Cardiovascular: regular rate and rhythm, no murmurs Musculoskeletal: no deformity Skin:  no rash/petichiae Vascular:  Normal pulses all extremities Vitals:   10/15/23 1024  BP: (!) 107/54  Pulse: 60   Neurologic Exam Mental Status: Awake and fully alert. Oriented to place and time. Recent and remote memory intact. Attention span, concentration and fund of knowledge appropriate. Mood and affect appropriate.  Cranial Nerves: Fundoscopic exam reveals sharp disc margins. Pupils equal, briskly reactive to light. Extraocular  movements full without nystagmus. Visual fields full to confrontation. Hearing intact. Facial sensation intact. Face, tongue, palate moves normally and symmetrically.  Motor: Normal bulk and tone. Normal strength in all tested extremity muscles.  Decreased fine finger movements in the right hand.  Albeit left over right upper extremity.  Good grip strength bilaterally Sensory.: intact to touch ,pinprick .position and vibratory sensation.  Coordination: Rapid alternating movements normal in all extremities. Finger-to-nose and heel-to-shin performed accurately bilaterally. Gait and Station: Arises from chair without difficulty. Stance is normal. Gait demonstrates normal stride length and balance . Able to heel, toe and tandem walk without difficulty.  Reflexes: 1+ and symmetric. Toes downgoing.   NIHSS  0 Modified Rankin  1   ASSESSMENT: 78 year old Caucasian male with right hand weakness due to embolic left MCA branch infarct in March 2025 as well as right hemispheric TIA few days later with history of paroxysmal A-fib.  Vascular risk factors of diabetes, hypertension, hyperlipidemia, sleep apnea and transient postop paroxysmal A-fib.  He is doing very well with no residual deficits.     PLAN:I had a long d/w patient about his recent embolic stroke and TIA, h/o postop atrial fibrillation,, risk for recurrent stroke/TIAs, personally independently reviewed imaging studies and stroke evaluation results and answered questions.Continue Eliquis  (apixaban ) 5 mg twice daily  for secondary stroke prevention and maintain strict control of hypertension with blood pressure goal below 130/90, diabetes with hemoglobin A1c goal below 6.5% and lipids with LDL cholesterol goal below 70 mg/dL. I also advised the patient to eat a healthy diet with plenty of whole grains, cereals, fruits and vegetables, exercise regularly and maintain ideal body weight .  Continue CPAP every night for his sleep apnea.  Followup in the  future with me only as necessary.  Greater than 50% of time during this 35 minute visit was spent on counseling,explanation of diagnosis of embolic stroke, history of paroxysmal A-fib, planning of further management, discussion with patient and family and coordination of care Eather Popp, MD Note: This document was prepared with digital dictation and possible smart phrase technology. Any transcriptional  errors that result from this process are unintentional

## 2023-10-17 DIAGNOSIS — E1165 Type 2 diabetes mellitus with hyperglycemia: Secondary | ICD-10-CM | POA: Diagnosis not present

## 2023-11-19 DIAGNOSIS — G4733 Obstructive sleep apnea (adult) (pediatric): Secondary | ICD-10-CM | POA: Diagnosis not present

## 2023-11-21 DIAGNOSIS — H52203 Unspecified astigmatism, bilateral: Secondary | ICD-10-CM | POA: Diagnosis not present

## 2023-11-21 DIAGNOSIS — H353132 Nonexudative age-related macular degeneration, bilateral, intermediate dry stage: Secondary | ICD-10-CM | POA: Diagnosis not present

## 2023-11-21 DIAGNOSIS — E119 Type 2 diabetes mellitus without complications: Secondary | ICD-10-CM | POA: Diagnosis not present

## 2023-11-21 DIAGNOSIS — Z961 Presence of intraocular lens: Secondary | ICD-10-CM | POA: Diagnosis not present

## 2023-11-21 DIAGNOSIS — H524 Presbyopia: Secondary | ICD-10-CM | POA: Diagnosis not present

## 2023-12-24 DIAGNOSIS — R5381 Other malaise: Secondary | ICD-10-CM | POA: Diagnosis not present

## 2023-12-24 DIAGNOSIS — Z03818 Encounter for observation for suspected exposure to other biological agents ruled out: Secondary | ICD-10-CM | POA: Diagnosis not present

## 2023-12-24 DIAGNOSIS — R52 Pain, unspecified: Secondary | ICD-10-CM | POA: Diagnosis not present

## 2023-12-24 DIAGNOSIS — R300 Dysuria: Secondary | ICD-10-CM | POA: Diagnosis not present

## 2023-12-28 ENCOUNTER — Other Ambulatory Visit: Payer: Self-pay | Admitting: Cardiovascular Disease

## 2023-12-30 NOTE — Telephone Encounter (Signed)
 Prescription refill request for Eliquis  received. Indication:afib Last office visit:6/25 Scr:1.20  3/25 Age: 78 Weight:97  kg  Prescription refilled

## 2024-01-02 DIAGNOSIS — E782 Mixed hyperlipidemia: Secondary | ICD-10-CM | POA: Diagnosis not present

## 2024-01-02 DIAGNOSIS — R5383 Other fatigue: Secondary | ICD-10-CM | POA: Diagnosis not present

## 2024-01-02 DIAGNOSIS — I480 Paroxysmal atrial fibrillation: Secondary | ICD-10-CM | POA: Diagnosis not present

## 2024-01-02 DIAGNOSIS — I2510 Atherosclerotic heart disease of native coronary artery without angina pectoris: Secondary | ICD-10-CM | POA: Diagnosis not present

## 2024-01-02 DIAGNOSIS — E1140 Type 2 diabetes mellitus with diabetic neuropathy, unspecified: Secondary | ICD-10-CM | POA: Diagnosis not present

## 2024-01-02 DIAGNOSIS — Z23 Encounter for immunization: Secondary | ICD-10-CM | POA: Diagnosis not present

## 2024-01-02 DIAGNOSIS — I10 Essential (primary) hypertension: Secondary | ICD-10-CM | POA: Diagnosis not present

## 2024-01-02 DIAGNOSIS — M791 Myalgia, unspecified site: Secondary | ICD-10-CM | POA: Diagnosis not present

## 2024-01-14 DIAGNOSIS — Z860101 Personal history of adenomatous and serrated colon polyps: Secondary | ICD-10-CM | POA: Diagnosis not present

## 2024-01-14 DIAGNOSIS — K5730 Diverticulosis of large intestine without perforation or abscess without bleeding: Secondary | ICD-10-CM | POA: Diagnosis not present

## 2024-01-14 DIAGNOSIS — Z09 Encounter for follow-up examination after completed treatment for conditions other than malignant neoplasm: Secondary | ICD-10-CM | POA: Diagnosis not present

## 2024-01-14 DIAGNOSIS — D122 Benign neoplasm of ascending colon: Secondary | ICD-10-CM | POA: Diagnosis not present

## 2024-01-14 DIAGNOSIS — K648 Other hemorrhoids: Secondary | ICD-10-CM | POA: Diagnosis not present

## 2024-01-14 DIAGNOSIS — K635 Polyp of colon: Secondary | ICD-10-CM | POA: Diagnosis not present

## 2024-01-14 DIAGNOSIS — D120 Benign neoplasm of cecum: Secondary | ICD-10-CM | POA: Diagnosis not present

## 2024-01-14 DIAGNOSIS — K644 Residual hemorrhoidal skin tags: Secondary | ICD-10-CM | POA: Diagnosis not present

## 2024-02-04 DIAGNOSIS — I480 Paroxysmal atrial fibrillation: Secondary | ICD-10-CM | POA: Diagnosis not present

## 2024-02-04 DIAGNOSIS — G4733 Obstructive sleep apnea (adult) (pediatric): Secondary | ICD-10-CM | POA: Diagnosis not present

## 2024-02-04 DIAGNOSIS — I10 Essential (primary) hypertension: Secondary | ICD-10-CM | POA: Diagnosis not present

## 2024-02-14 ENCOUNTER — Other Ambulatory Visit: Payer: Self-pay | Admitting: Cardiovascular Disease

## 2024-03-27 ENCOUNTER — Other Ambulatory Visit (HOSPITAL_COMMUNITY): Payer: Self-pay | Admitting: Nurse Practitioner

## 2024-03-27 DIAGNOSIS — R1032 Left lower quadrant pain: Secondary | ICD-10-CM

## 2024-03-28 ENCOUNTER — Ambulatory Visit (HOSPITAL_BASED_OUTPATIENT_CLINIC_OR_DEPARTMENT_OTHER)
Admission: RE | Admit: 2024-03-28 | Discharge: 2024-03-28 | Disposition: A | Discharge status: Home / Self Care | Source: Ambulatory Visit | Attending: Nurse Practitioner | Admitting: Nurse Practitioner

## 2024-03-28 DIAGNOSIS — R1032 Left lower quadrant pain: Secondary | ICD-10-CM | POA: Insufficient documentation

## 2024-03-28 MED ORDER — IOHEXOL 300 MG/ML  SOLN
100.0000 mL | Freq: Once | INTRAMUSCULAR | Status: AC | PRN
  Administered 2024-03-28: 100 mL via INTRAVENOUS

## 2024-03-31 LAB — POCT I-STAT CREATININE: Creatinine, Ser: 1.2 mg/dL (ref 0.61–1.24)

## 2024-04-03 ENCOUNTER — Encounter: Payer: Self-pay | Admitting: Cardiovascular Disease

## 2024-04-08 ENCOUNTER — Telehealth: Payer: Self-pay | Admitting: Pharmacy Technician

## 2024-04-08 ENCOUNTER — Other Ambulatory Visit (HOSPITAL_COMMUNITY): Payer: Self-pay

## 2024-04-08 NOTE — Telephone Encounter (Signed)
" °  Unclear if this will work with his insurance. He has bcbs federal. Gave coupon information to [employer] to see if it would work.   He does not have cardiomyopathy  Xarelto would be 96.00 on insurance. He has coinsurance prices.  "
